# Patient Record
Sex: Female | Born: 1980 | Race: White | Hispanic: No | Smoking: Current every day smoker
Health system: Southern US, Community
[De-identification: ages and names within clinical notes are randomized; demographics above are authoritative.]

## PROBLEM LIST (undated history)

## (undated) ENCOUNTER — Inpatient Hospital Stay: Admission: EM | Payer: Self-pay | Source: Home / Self Care

## (undated) DIAGNOSIS — M549 Dorsalgia, unspecified: Secondary | ICD-10-CM

## (undated) DIAGNOSIS — R636 Underweight: Secondary | ICD-10-CM

## (undated) DIAGNOSIS — R1900 Intra-abdominal and pelvic swelling, mass and lump, unspecified site: Secondary | ICD-10-CM

## (undated) DIAGNOSIS — J40 Bronchitis, not specified as acute or chronic: Secondary | ICD-10-CM

## (undated) DIAGNOSIS — N939 Abnormal uterine and vaginal bleeding, unspecified: Secondary | ICD-10-CM

## (undated) DIAGNOSIS — Z720 Tobacco use: Secondary | ICD-10-CM

## (undated) DIAGNOSIS — I499 Cardiac arrhythmia, unspecified: Secondary | ICD-10-CM

## (undated) DIAGNOSIS — K219 Gastro-esophageal reflux disease without esophagitis: Secondary | ICD-10-CM

## (undated) DIAGNOSIS — K279 Peptic ulcer, site unspecified, unspecified as acute or chronic, without hemorrhage or perforation: Secondary | ICD-10-CM

## (undated) DIAGNOSIS — G709 Myoneural disorder, unspecified: Secondary | ICD-10-CM

## (undated) DIAGNOSIS — F419 Anxiety disorder, unspecified: Secondary | ICD-10-CM

## (undated) DIAGNOSIS — I10 Essential (primary) hypertension: Secondary | ICD-10-CM

## (undated) DIAGNOSIS — K8020 Calculus of gallbladder without cholecystitis without obstruction: Secondary | ICD-10-CM

## (undated) DIAGNOSIS — R000 Tachycardia, unspecified: Secondary | ICD-10-CM

## (undated) DIAGNOSIS — G8929 Other chronic pain: Secondary | ICD-10-CM

## (undated) HISTORY — PX: OTHER SURGICAL HISTORY: SHX169

## (undated) HISTORY — DX: Tobacco use: Z72.0

## (undated) HISTORY — DX: Myoneural disorder, unspecified: G70.9

## (undated) HISTORY — DX: Abnormal uterine and vaginal bleeding, unspecified: N93.9

## (undated) HISTORY — PX: DENTAL REHABILITATION: SHX1449

## (undated) HISTORY — DX: Calculus of gallbladder without cholecystitis without obstruction: K80.20

---
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
## 2000-06-20 ENCOUNTER — Emergency Department (HOSPITAL_COMMUNITY): Admission: EM | Admit: 2000-06-20 | Discharge: 2000-06-20 | Payer: Self-pay | Admitting: *Deleted

## 2000-12-20 ENCOUNTER — Emergency Department (HOSPITAL_COMMUNITY): Admission: EM | Admit: 2000-12-20 | Discharge: 2000-12-20 | Payer: Self-pay | Admitting: Emergency Medicine

## 2002-05-25 ENCOUNTER — Other Ambulatory Visit: Admission: RE | Admit: 2002-05-25 | Discharge: 2002-05-25 | Payer: Self-pay | Admitting: Obstetrics & Gynecology

## 2003-06-26 ENCOUNTER — Ambulatory Visit (HOSPITAL_COMMUNITY): Admission: RE | Admit: 2003-06-26 | Discharge: 2003-06-26 | Payer: Self-pay | Admitting: Obstetrics and Gynecology

## 2003-07-19 ENCOUNTER — Inpatient Hospital Stay (HOSPITAL_COMMUNITY): Admission: AD | Admit: 2003-07-19 | Discharge: 2003-07-19 | Payer: Self-pay | Admitting: Obstetrics and Gynecology

## 2003-08-22 ENCOUNTER — Inpatient Hospital Stay (HOSPITAL_COMMUNITY): Admission: AD | Admit: 2003-08-22 | Discharge: 2003-08-22 | Payer: Self-pay | Admitting: Obstetrics and Gynecology

## 2003-09-20 ENCOUNTER — Inpatient Hospital Stay (HOSPITAL_COMMUNITY): Admission: AD | Admit: 2003-09-20 | Discharge: 2003-09-22 | Payer: Self-pay | Admitting: Obstetrics and Gynecology

## 2003-10-18 ENCOUNTER — Other Ambulatory Visit: Admission: RE | Admit: 2003-10-18 | Discharge: 2003-10-18 | Payer: Self-pay | Admitting: Obstetrics and Gynecology

## 2004-09-09 ENCOUNTER — Emergency Department (HOSPITAL_COMMUNITY): Admission: EM | Admit: 2004-09-09 | Discharge: 2004-09-09 | Payer: Self-pay | Admitting: Family Medicine

## 2004-10-17 ENCOUNTER — Ambulatory Visit: Payer: Self-pay | Admitting: Internal Medicine

## 2004-10-17 ENCOUNTER — Ambulatory Visit (HOSPITAL_COMMUNITY): Admission: RE | Admit: 2004-10-17 | Discharge: 2004-10-17 | Payer: Self-pay | Admitting: Internal Medicine

## 2004-10-21 ENCOUNTER — Ambulatory Visit: Payer: Self-pay | Admitting: *Deleted

## 2004-11-06 ENCOUNTER — Ambulatory Visit: Payer: Self-pay | Admitting: Internal Medicine

## 2004-12-05 ENCOUNTER — Ambulatory Visit: Payer: Self-pay | Admitting: Internal Medicine

## 2004-12-15 ENCOUNTER — Encounter: Admission: RE | Admit: 2004-12-15 | Discharge: 2005-01-13 | Payer: Self-pay | Admitting: Internal Medicine

## 2004-12-19 ENCOUNTER — Ambulatory Visit: Payer: Self-pay | Admitting: Internal Medicine

## 2004-12-29 ENCOUNTER — Encounter: Payer: Self-pay | Admitting: Internal Medicine

## 2004-12-29 LAB — CONVERTED CEMR LAB: Pap Smear: NORMAL

## 2005-09-28 ENCOUNTER — Emergency Department (HOSPITAL_COMMUNITY): Admission: EM | Admit: 2005-09-28 | Discharge: 2005-09-28 | Payer: Self-pay | Admitting: Family Medicine

## 2005-12-24 ENCOUNTER — Ambulatory Visit: Payer: Self-pay | Admitting: Internal Medicine

## 2006-01-11 ENCOUNTER — Emergency Department (HOSPITAL_COMMUNITY): Admission: EM | Admit: 2006-01-11 | Discharge: 2006-01-11 | Payer: Self-pay | Admitting: Family Medicine

## 2006-02-02 ENCOUNTER — Encounter: Payer: Self-pay | Admitting: Internal Medicine

## 2006-02-02 DIAGNOSIS — F17200 Nicotine dependence, unspecified, uncomplicated: Secondary | ICD-10-CM | POA: Insufficient documentation

## 2006-02-02 DIAGNOSIS — M25519 Pain in unspecified shoulder: Secondary | ICD-10-CM

## 2006-02-02 HISTORY — DX: Nicotine dependence, unspecified, uncomplicated: F17.200

## 2006-02-03 ENCOUNTER — Ambulatory Visit: Payer: Self-pay | Admitting: Internal Medicine

## 2006-02-03 ENCOUNTER — Ambulatory Visit (HOSPITAL_COMMUNITY): Admission: RE | Admit: 2006-02-03 | Discharge: 2006-02-03 | Payer: Self-pay | Admitting: Internal Medicine

## 2006-02-03 LAB — CONVERTED CEMR LAB
ALT: <8 units/L (ref 0–35)
AST: 9 units/L (ref 0–37)
Albumin: 4.4 g/dL (ref 3.5–5.2)
Alkaline Phosphatase: 57 units/L (ref 39–117)
BUN: 7 mg/dL (ref 6–23)
Basophils Absolute: 0 10*3/uL (ref 0.0–0.1)
CO2: 25 meq/L (ref 19–32)
Calcium: 11.3 mg/dL — ABNORMAL HIGH (ref 8.4–10.5)
Creatinine, Ser: 0.75 mg/dL (ref 0.40–1.20)
Eosinophils Relative: 4 % (ref 0–4)
HCT: 42.9 % (ref 34.4–43.3)
Lymphocytes Relative: 40 % (ref 15–43)
Lymphs Abs: 2.1 10*3/uL (ref 0.8–3.1)
Monocytes Absolute: 0.4 10*3/uL (ref 0.2–0.7)
Monocytes Relative: 7 % (ref 3–11)
Potassium: 4.1 meq/L (ref 3.5–5.3)
RBC: 4.42 M/uL (ref 3.79–4.96)
Sodium: 141 meq/L (ref 135–145)
TSH: 1.458 microintl units/mL (ref 0.350–5.50)
Total Bilirubin: 1.7 mg/dL — ABNORMAL HIGH (ref 0.3–1.2)
Total Protein: 7.5 g/dL (ref 6.0–8.3)
Vitamin B-12: 259 pg/mL (ref 211–911)

## 2006-02-09 ENCOUNTER — Ambulatory Visit: Payer: Self-pay | Admitting: Internal Medicine

## 2006-02-09 LAB — CONVERTED CEMR LAB
AST: 12 units/L (ref 0–37)
BUN: 7 mg/dL (ref 6–23)
Calcium, Total (PTH): 9.5 mg/dL (ref 8.4–10.5)
Calcium: 9.5 mg/dL (ref 8.4–10.5)
Total Bilirubin: 0.8 mg/dL (ref 0.3–1.2)

## 2006-03-10 ENCOUNTER — Ambulatory Visit: Payer: Self-pay | Admitting: Internal Medicine

## 2006-03-10 LAB — CONVERTED CEMR LAB
Basophils Relative: 1 % (ref 0–1)
Eosinophils Relative: 5 % (ref 0–5)
Lymphocytes Relative: 36 % (ref 12–46)
MCHC: 34.4 g/dL (ref 30.0–36.0)
Monocytes Absolute: 0.5 10*3/uL (ref 0.2–0.7)
Monocytes Relative: 6 % (ref 3–11)
Neutro Abs: 4.4 10*3/uL (ref 1.7–7.7)
Neutrophils Relative %: 53 % (ref 43–77)
RBC: 4.13 M/uL (ref 3.87–5.11)
RDW: 12.8 % (ref 11.5–14.0)
Sed Rate: 6 mm/hr (ref 0–22)

## 2006-03-12 DIAGNOSIS — D239 Other benign neoplasm of skin, unspecified: Secondary | ICD-10-CM | POA: Insufficient documentation

## 2006-03-26 ENCOUNTER — Ambulatory Visit (HOSPITAL_COMMUNITY): Admission: RE | Admit: 2006-03-26 | Discharge: 2006-03-26 | Payer: Self-pay | Admitting: Internal Medicine

## 2006-04-02 ENCOUNTER — Telehealth: Payer: Self-pay | Admitting: Internal Medicine

## 2006-04-02 ENCOUNTER — Encounter: Payer: Self-pay | Admitting: Internal Medicine

## 2006-04-06 ENCOUNTER — Emergency Department (HOSPITAL_COMMUNITY): Admission: EM | Admit: 2006-04-06 | Discharge: 2006-04-06 | Payer: Self-pay | Admitting: Family Medicine

## 2006-04-14 ENCOUNTER — Encounter: Payer: Self-pay | Admitting: Internal Medicine

## 2006-04-14 ENCOUNTER — Encounter: Payer: Self-pay | Admitting: Obstetrics and Gynecology

## 2006-04-14 ENCOUNTER — Ambulatory Visit: Payer: Self-pay | Admitting: Obstetrics and Gynecology

## 2006-04-14 LAB — CONVERTED CEMR LAB

## 2006-04-30 ENCOUNTER — Emergency Department (HOSPITAL_COMMUNITY): Admission: EM | Admit: 2006-04-30 | Discharge: 2006-04-30 | Payer: Self-pay | Admitting: Emergency Medicine

## 2006-05-21 ENCOUNTER — Emergency Department (HOSPITAL_COMMUNITY): Admission: EM | Admit: 2006-05-21 | Discharge: 2006-05-21 | Payer: Self-pay | Admitting: Family Medicine

## 2006-05-26 ENCOUNTER — Ambulatory Visit: Payer: Self-pay | Admitting: Internal Medicine

## 2006-07-23 ENCOUNTER — Encounter: Payer: Self-pay | Admitting: Internal Medicine

## 2006-07-28 ENCOUNTER — Ambulatory Visit: Payer: Self-pay | Admitting: Internal Medicine

## 2006-08-24 ENCOUNTER — Emergency Department (HOSPITAL_COMMUNITY): Admission: EM | Admit: 2006-08-24 | Discharge: 2006-08-24 | Payer: Self-pay | Admitting: Family Medicine

## 2006-09-27 ENCOUNTER — Emergency Department (HOSPITAL_COMMUNITY): Admission: EM | Admit: 2006-09-27 | Discharge: 2006-09-27 | Payer: Self-pay | Admitting: Emergency Medicine

## 2006-10-14 ENCOUNTER — Ambulatory Visit: Payer: Self-pay | Admitting: Family Medicine

## 2006-10-15 ENCOUNTER — Ambulatory Visit (HOSPITAL_COMMUNITY): Admission: RE | Admit: 2006-10-15 | Discharge: 2006-10-15 | Payer: Self-pay | Admitting: Family Medicine

## 2006-11-26 ENCOUNTER — Ambulatory Visit: Payer: Self-pay | Admitting: Gynecology

## 2007-01-05 ENCOUNTER — Emergency Department (HOSPITAL_COMMUNITY): Admission: EM | Admit: 2007-01-05 | Discharge: 2007-01-05 | Payer: Self-pay | Admitting: Family Medicine

## 2007-02-11 ENCOUNTER — Emergency Department (HOSPITAL_COMMUNITY): Admission: EM | Admit: 2007-02-11 | Discharge: 2007-02-11 | Payer: Self-pay | Admitting: Emergency Medicine

## 2007-05-25 ENCOUNTER — Ambulatory Visit: Payer: Self-pay | Admitting: Internal Medicine

## 2007-05-25 DIAGNOSIS — R221 Localized swelling, mass and lump, neck: Secondary | ICD-10-CM

## 2007-05-25 DIAGNOSIS — R220 Localized swelling, mass and lump, head: Secondary | ICD-10-CM

## 2007-05-25 LAB — CONVERTED CEMR LAB
ALT: <8 units/L (ref 0–35)
Albumin: 4.4 g/dL (ref 3.5–5.2)
CO2: 25 meq/L (ref 19–32)
Glucose, Bld: 83 mg/dL (ref 70–99)
HCT: 42 % (ref 36.0–46.0)
Hemoglobin: 13.9 g/dL (ref 12.0–15.0)
MCHC: 33.1 g/dL (ref 30.0–36.0)
Neutro Abs: 2.5 10*3/uL (ref 1.7–7.7)
Potassium: 4.3 meq/L (ref 3.5–5.3)
RBC: 4.25 M/uL (ref 3.87–5.11)
Sed Rate: 9 mm/hr (ref 0–22)
Sodium: 142 meq/L (ref 135–145)
Total Protein: 7.2 g/dL (ref 6.0–8.3)
WBC: 5.5 10*3/uL (ref 4.0–10.5)

## 2007-06-29 ENCOUNTER — Ambulatory Visit: Payer: Self-pay | Admitting: Internal Medicine

## 2007-06-29 DIAGNOSIS — L708 Other acne: Secondary | ICD-10-CM | POA: Insufficient documentation

## 2007-10-26 ENCOUNTER — Ambulatory Visit: Payer: Self-pay | Admitting: *Deleted

## 2007-10-26 ENCOUNTER — Ambulatory Visit (HOSPITAL_COMMUNITY): Admission: RE | Admit: 2007-10-26 | Discharge: 2007-10-26 | Payer: Self-pay | Admitting: *Deleted

## 2007-10-26 DIAGNOSIS — M533 Sacrococcygeal disorders, not elsewhere classified: Secondary | ICD-10-CM | POA: Insufficient documentation

## 2008-01-18 ENCOUNTER — Telehealth: Payer: Self-pay | Admitting: *Deleted

## 2008-04-13 ENCOUNTER — Encounter: Payer: Self-pay | Admitting: Internal Medicine

## 2008-04-13 ENCOUNTER — Ambulatory Visit: Payer: Self-pay | Admitting: Internal Medicine

## 2008-04-13 DIAGNOSIS — M5417 Radiculopathy, lumbosacral region: Secondary | ICD-10-CM | POA: Insufficient documentation

## 2008-04-16 ENCOUNTER — Ambulatory Visit (HOSPITAL_COMMUNITY): Admission: RE | Admit: 2008-04-16 | Discharge: 2008-04-16 | Payer: Self-pay | Admitting: Internal Medicine

## 2008-04-23 ENCOUNTER — Ambulatory Visit: Payer: Self-pay | Admitting: Sports Medicine

## 2008-04-26 ENCOUNTER — Telehealth (INDEPENDENT_AMBULATORY_CARE_PROVIDER_SITE_OTHER): Payer: Self-pay | Admitting: *Deleted

## 2008-04-30 ENCOUNTER — Encounter: Payer: Self-pay | Admitting: Sports Medicine

## 2008-05-09 ENCOUNTER — Ambulatory Visit: Payer: Self-pay | Admitting: Internal Medicine

## 2008-05-11 ENCOUNTER — Encounter: Admission: RE | Admit: 2008-05-11 | Discharge: 2008-07-04 | Payer: Self-pay | Admitting: Sports Medicine

## 2008-05-21 ENCOUNTER — Encounter (INDEPENDENT_AMBULATORY_CARE_PROVIDER_SITE_OTHER): Payer: Self-pay | Admitting: Family Medicine

## 2008-05-21 ENCOUNTER — Ambulatory Visit: Payer: Self-pay | Admitting: Sports Medicine

## 2008-05-21 DIAGNOSIS — M4040 Postural lordosis, site unspecified: Secondary | ICD-10-CM | POA: Insufficient documentation

## 2008-05-22 ENCOUNTER — Encounter: Payer: Self-pay | Admitting: Sports Medicine

## 2008-06-13 ENCOUNTER — Encounter: Payer: Self-pay | Admitting: Sports Medicine

## 2008-07-09 ENCOUNTER — Encounter: Payer: Self-pay | Admitting: Sports Medicine

## 2008-08-01 ENCOUNTER — Ambulatory Visit: Payer: Self-pay | Admitting: Sports Medicine

## 2008-08-08 ENCOUNTER — Ambulatory Visit: Payer: Self-pay | Admitting: Internal Medicine

## 2008-08-10 DIAGNOSIS — R519 Headache, unspecified: Secondary | ICD-10-CM | POA: Insufficient documentation

## 2008-08-10 DIAGNOSIS — R51 Headache: Secondary | ICD-10-CM

## 2008-08-31 ENCOUNTER — Emergency Department (HOSPITAL_COMMUNITY): Admission: EM | Admit: 2008-08-31 | Discharge: 2008-08-31 | Payer: Self-pay | Admitting: Family Medicine

## 2008-09-06 ENCOUNTER — Telehealth: Payer: Self-pay | Admitting: Internal Medicine

## 2008-09-14 ENCOUNTER — Encounter: Payer: Self-pay | Admitting: Internal Medicine

## 2008-09-14 ENCOUNTER — Ambulatory Visit: Payer: Self-pay | Admitting: Internal Medicine

## 2008-09-14 DIAGNOSIS — L989 Disorder of the skin and subcutaneous tissue, unspecified: Secondary | ICD-10-CM | POA: Insufficient documentation

## 2008-09-14 LAB — CONVERTED CEMR LAB
Cholesterol: 180 mg/dL (ref 0–200)
HCT: 38.5 % (ref 36.0–46.0)
HDL: 38 mg/dL — ABNORMAL LOW (ref >39)
Hemoglobin: 13.4 g/dL (ref 12.0–15.0)
LDL Cholesterol: 115 mg/dL — ABNORMAL HIGH (ref 0–99)
RBC: 4.06 M/uL (ref 3.87–5.11)
Triglycerides: 133 mg/dL (ref <150)
WBC: 5.1 10*3/uL (ref 4.0–10.5)

## 2008-11-07 ENCOUNTER — Ambulatory Visit: Payer: Self-pay | Admitting: Sports Medicine

## 2009-01-22 ENCOUNTER — Emergency Department (HOSPITAL_COMMUNITY): Admission: EM | Admit: 2009-01-22 | Discharge: 2009-01-22 | Payer: Self-pay | Admitting: Family Medicine

## 2009-05-01 ENCOUNTER — Ambulatory Visit: Payer: Self-pay | Admitting: Internal Medicine

## 2009-05-02 DIAGNOSIS — M25579 Pain in unspecified ankle and joints of unspecified foot: Secondary | ICD-10-CM

## 2009-05-09 ENCOUNTER — Encounter: Payer: Self-pay | Admitting: Internal Medicine

## 2009-06-23 ENCOUNTER — Emergency Department (HOSPITAL_COMMUNITY): Admission: EM | Admit: 2009-06-23 | Discharge: 2009-06-23 | Payer: Self-pay | Admitting: Family Medicine

## 2009-06-26 ENCOUNTER — Ambulatory Visit: Payer: Self-pay | Admitting: Obstetrics & Gynecology

## 2009-07-24 ENCOUNTER — Ambulatory Visit: Payer: Self-pay | Admitting: Sports Medicine

## 2009-07-24 ENCOUNTER — Ambulatory Visit: Payer: Self-pay | Admitting: Obstetrics and Gynecology

## 2009-07-24 ENCOUNTER — Other Ambulatory Visit: Admission: RE | Admit: 2009-07-24 | Discharge: 2009-07-24 | Payer: Self-pay | Admitting: Obstetrics and Gynecology

## 2009-08-07 ENCOUNTER — Ambulatory Visit: Payer: Self-pay | Admitting: Obstetrics and Gynecology

## 2009-08-14 ENCOUNTER — Emergency Department (HOSPITAL_COMMUNITY): Admission: EM | Admit: 2009-08-14 | Discharge: 2009-08-14 | Payer: Self-pay | Admitting: Family Medicine

## 2009-12-11 ENCOUNTER — Ambulatory Visit: Payer: Self-pay | Admitting: Sports Medicine

## 2010-01-02 ENCOUNTER — Emergency Department (HOSPITAL_COMMUNITY): Admission: EM | Admit: 2010-01-02 | Discharge: 2010-01-02 | Payer: Self-pay | Admitting: Family Medicine

## 2010-02-06 ENCOUNTER — Ambulatory Visit: Payer: Self-pay | Admitting: Sports Medicine

## 2010-02-06 DIAGNOSIS — M79609 Pain in unspecified limb: Secondary | ICD-10-CM

## 2010-02-19 ENCOUNTER — Ambulatory Visit: Payer: Self-pay | Admitting: Obstetrics & Gynecology

## 2010-03-13 ENCOUNTER — Ambulatory Visit
Admission: RE | Admit: 2010-03-13 | Discharge: 2010-03-13 | Payer: Self-pay | Source: Home / Self Care | Attending: Obstetrics and Gynecology | Admitting: Obstetrics and Gynecology

## 2010-03-18 ENCOUNTER — Encounter
Admission: RE | Admit: 2010-03-18 | Discharge: 2010-03-18 | Payer: Self-pay | Source: Home / Self Care | Attending: Obstetrics and Gynecology | Admitting: Obstetrics and Gynecology

## 2010-03-19 ENCOUNTER — Encounter (INDEPENDENT_AMBULATORY_CARE_PROVIDER_SITE_OTHER): Payer: Self-pay | Admitting: *Deleted

## 2010-03-27 ENCOUNTER — Emergency Department (HOSPITAL_COMMUNITY)
Admission: EM | Admit: 2010-03-27 | Discharge: 2010-03-27 | Payer: Self-pay | Source: Home / Self Care | Admitting: Family Medicine

## 2010-03-27 ENCOUNTER — Ambulatory Visit
Admission: RE | Admit: 2010-03-27 | Discharge: 2010-03-27 | Payer: Self-pay | Source: Home / Self Care | Attending: Obstetrics and Gynecology | Admitting: Obstetrics and Gynecology

## 2010-03-28 NOTE — Progress Notes (Unsigned)
NAME:  Melissa Walker, Melissa Walker NO.:  [id number]  MEDICAL RECORD NO.:  [id number]          PATIENT TYPE:  WOC  LOCATION:  [facility]                   FACILITY:  [facility]  PHYSICIAN:  Argentina Donovan, MD        DATE OF BIRTH:  10-18-1980  DATE OF SERVICE:  03/27/2010                                 CLINIC NOTE  The patient is a 30 year old white female who was sent for ultrasound of the breast because of a tender nodule in the area of the nipple, I thought this is probably due to fibrocystic disease, everything looked benign, we do want a followup ultrasound again in 6 months since I found a tiny fibroadenoma around the nipple of the left breast, so they told the patient that they would contact her at that point.  IMPRESSION:  Fibrocystic breast.          ______________________________ Argentina Donovan, MD    PR/MEDQ  D:  03/27/2010  T:  03/28/2010  Job:  [id number]

## 2010-04-01 NOTE — Assessment & Plan Note (Signed)
Summary: BACK PAIN,MC   Vital Signs:  Patient profile:   30 year old female BP sitting:   122 / 83  Vitals Entered By: Lillia Pauls CMA (Jul 24, 2009 9:01 AM)  Primary Provider:  Margarito Liner MD   History of Present Illness: still standing at job uses brace 3-4 hrs/day helps some amytryptolene at night, 4 tramadol/day  moderates but doesn't get rid of pain  no pain before pregnancy  still has daily pain pain will get all the way to 9 level at times when severe takes pill and lays down - this helps in 20 mins  Allergies: 1)  ! Penicillin 2)  ! Nicoderm Cq (Nicotine)  Physical Exam  General:  thin W female looks depressed nad  tight hamstrings on forward flexion - can do hamstring stretch  excess lumbar lordosis  midline back pain on back extension  toe, heel, tandem walk fine  tight on lumbar areat on rotation good SIJ movement on rock faber, hip flexion nl  good quad strength     Impression & Recommendations:  Problem # 1:  LOW BACK PAIN, CHRONIC (ICD-724.2)  Her updated medication list for this problem includes:    Tramadol Hcl 50 Mg Tabs (Tramadol hcl) .Marland Kitchen... Take 1 tablet by mouth four times a day as needed for pain  uses amitriptyline for headaches will increase this to work on LBP  start now at 50 mgm nightly may try increasing to 100 over 3 mos if sideeffects don't intervene  I will reck in 6 mos and let dr Meredith Pel increase meds if they are helping  Her updated medication list for this problem includes:    Tramadol Hcl 50 Mg Tabs (Tramadol hcl) .Marland Kitchen... 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 Apply two times a day to affected area. 2)  Tramadol Hcl 50 Mg Tabs (Tramadol hcl) .... Take 1 tablet by mouth four times a day  as needed for pain 3)  Amitriptyline Hcl 25 Mg Tabs (Amitriptyline hcl) .... Take 1 tablet by mouth at bedtime 4)  Doxycycline Hyclate 100 Mg Caps (Doxycycline hyclate) .... Take 1 capsule by mouth two times a day

## 2010-04-01 NOTE — Miscellaneous (Signed)
Summary: [facility]: Vaccine Records  [facility]: Vaccine Records   Imported By: Florinda Marker 05/14/2009 10:43:12  _____________________________________________________________________  External Attachment:    Type:   Image     Comment:   External Document  Appended Document: [facility]: Vaccine Records  Immunization History:  Tetanus/Td Immunization History:    Tetanus/Td:  td (09/27/2007)

## 2010-04-01 NOTE — Assessment & Plan Note (Signed)
Summary: check up [mkj]   Vital Signs:  Patient profile:   30 year old female Height:      63 inches Weight:      89.8 pounds BMI:     15.96 Temp:     97.4 degrees F oral Pulse rate:   81 / minute BP sitting:   121 / 78  (right arm)  Vitals Entered By: Filomena Jungling NT II (May 01, 2009 8:34 AM) CC: check up need refills, ? something for rash ?antibotic Gel did not work/  ankle still bothering here Is Patient Diabetic? No Pain Assessment Patient in pain? yes     Location: ankle Intensity: 8 Type: aching Onset of pain  november  201o Nutritional Status BMI of < 19 = underweight  Have you ever been in a relationship where you felt threatened, hurt or afraid?No   Does patient need assistance? Functional Status Self care Ambulation Normal Comments n   Primary Care Provider:  Margarito Liner MD  CC:  check up need refills and ? something for rash ?antibotic Gel did not work/  ankle still bothering here.  History of Present Illness: Patient returns for follow-up of her chronic low back pain, acne, and other medical problems.  Her main complaint today is of recurrent acne lesions on her face, back, and arms; she reports that erythromycin gel is not clearing the lesions.  She requested an oral antibiotic that was previously prescribed at urgent care sometime last year for an acneform lesion on her back; she reports that her acne cleared with 10 days of therapy at that time.  (Review of the urgent care record indicates that she was given doxycycline 100 mg b.i.d. for 10 days.)  She reports that her back pain is doing reasonably well on her current regimen.  She reports that she is smoking less than 5 cigarettes per day.  Her low back pain is doing reasonably well on current regimen.  She reports some left ankle pain that has persisted following a prior ankle sprain; this is apparently much better.  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 Apply Two Times A Day To Affected Area. 2)  Tramadol Hcl 50 Mg Tabs (Tramadol Hcl) .... Take 1 Tablet By Mouth Four Times A Day As Needed For Pain 3)  Amitriptyline Hcl 25 Mg Tabs (Amitriptyline Hcl) .... Take 1 Tablet By Mouth At Bedtime  Allergies (verified): 1)  ! Penicillin 2)  ! Nicoderm Cq (Nicotine)  Family History: No cervical cancer. No breast cancer in immediate family. No colon cancer. Grandmother had lung cancer. Grandfather has prostate cancer. Family History Hypertension grandmother, uncle  Social History: Packs/Day:  <0.25  Physical Exam  General:  alert, no distress Lungs:  normal respiratory effort, normal breath sounds, no crackles, and no wheezes.   Heart:  normal rate, regular rhythm, no murmur, no gallop, and no rub.   Abdomen:  soft, non-tender,  normal bowel sounds, no hepatomegaly, and no splenomegaly.   Msk:  there is no swelling, discoloration, or apparent instability of the left ankle; there is some focal mild tenderness on the anterolateral aspect. Extremities:  no edema Skin:  scattered acneiform lesions on face, upper arms, and back   Impression & Recommendations:  Problem # 1:  ACNE, MILD (ICD-706.1) Patient has had a recent recurrence of acne lesions, and is not having a response to a topical antibiotic gel.  She reports good response to a previous short course of doxycycline.  I discussed the contraindication to doxycycline in pregnancy, and she assured me that her last menstrual period was two weeks ago, was normal and on time, and that there is no  possibility she is pregnant.  The plan is to treat with doxycycline 100 mg b.i.d. for 10 days, and then resume topical therapy for any recurrent lesions.  Her updated medication list for this problem includes:    Erythromycin 2 % Gel (Erythromycin) .Marland Kitchen... Apply two times a day to affected area.    Doxycycline Hyclate 100 Mg Caps (Doxycycline hyclate) .Marland Kitchen... Take 1 capsule by mouth two times a day  Problem # 2:  TOBACCO ABUSE (ICD-305.1) I discussed the importance of smoking cessation, and strongly advised patient to stop smoking.  She had an allergic reaction to topical nicotine patch in the past.  She is only smoking a few cigarettes per day, and denies significant craving when she does not smoke.  She feels that she can stop without a medication to assist her.  She did set a quit date and agreed to stop smoking at that time.  I advised her to contact me if she has difficulty stopping, and in that event would consider a medication such as Zyban.  Problem # 3:  LOW BACK PAIN, CHRONIC (ICD-724.2) Patient's chronic low back pain is doing reasonably well on current management.  The following medications were removed from the medication list:    Diclofenac Sodium 75 Mg Tbec (Diclofenac sodium) ..... One pill twice daily as needed for pain.    Cyclobenzaprine Hcl 5 Mg Tabs (Cyclobenzaprine hcl) .Marland Kitchen... Take 1 tablet by mouth once a day as needed for pain Her updated medication list for this problem includes:    Tramadol Hcl 50 Mg Tabs (Tramadol hcl) .Marland Kitchen... Take 1 tablet by mouth four times a day as needed for pain  Problem # 4:  ANKLE PAIN, LEFT (ICD-719.47) Patient has some residual pain and stiffness following a remote ankle sprain, but reports that this is much better than at the time of her injury.  Her exam is unremarkable.  I advised that she try an Ace wrap when she is on her feet at work, and she agreed to do this.  Complete Medication List: 1)  Erythromycin 2 % Gel (Erythromycin) .... Apply two  times a day to affected area. 2)  Tramadol Hcl 50 Mg Tabs (Tramadol hcl) .... Take 1 tablet by mouth four times a day as needed for pain 3)  Amitriptyline Hcl 25 Mg Tabs (Amitriptyline hcl) .... Take 1 tablet by mouth at bedtime 4)  Doxycycline Hyclate 100 Mg Caps (Doxycycline hyclate) .... Take 1 capsule by mouth two times a day  Other Orders: Gynecologic Referral (Gyn) Admin 1st Vaccine (96295) Flu Vaccine 12yrs + (28413)  Patient Instructions: 1)  Please schedule a follow-up appointment in 2 months. 2)  Take doxycycline 100 mg one capsule two times a day for 10 days. 3)  Use  erythromycin gel as prescribed for acne. 4)  Stop smoking.  Prescriptions: AMITRIPTYLINE HCL 25 MG TABS (AMITRIPTYLINE HCL) Take 1 tablet by mouth at bedtime  #30 x 6   Entered and Authorized by:   Margarito Liner MD   Signed by:   Margarito Liner MD on 05/01/2009   Method used:   Electronically to        [facility] [address] (retail)       [phone]       [place], [place]  [address]       Ph: [phone]       Fax: [phone]   RxID:   [id number] DOXYCYCLINE HYCLATE 100 MG CAPS (DOXYCYCLINE HYCLATE) Take 1 capsule by mouth two times a day  #20 x 0   Entered and Authorized by:   Margarito Liner MD   Signed by:   Margarito Liner MD on 05/01/2009   Method used:   Electronically to        [facility] [address] (retail)       [phone]       [place], [place]  [address]       Ph: [phone]       Fax: [phone]   RxID:   [id number] TRAMADOL HCL 50 MG TABS (TRAMADOL HCL) Take 1 tablet by mouth four times a day as needed for pain  #120 x 3   Entered and Authorized by:   Margarito Liner MD   Signed by:   Margarito Liner MD on 05/01/2009   Method used:   Electronically to        [facility] [address] (retail)       [phone]       [place], [place]  [address]       Ph: [phone]       Fax:  [phone]   RxID:   [id number] ERYTHROMYCIN 2 %  GEL (ERYTHROMYCIN) Apply two times a day to affected area.  #30 grams x 3   Entered and Authorized by:   Margarito Liner MD   Signed by:   Margarito Liner MD on 05/01/2009   Method used:   Electronically to        [facility] [address] (retail)       [phone]       [place], [place]  [address]       Ph: [phone]       Fax: [phone]   RxID:   [id number]   Prevention & Chronic Care Immunizations   Influenza vaccine: Fluvax 3+  (05/01/2009)    Tetanus booster: Not documented    Pneumococcal vaccine: Not documented    Immunization comments: Reports that she had a tetanus booster at [facility] about 1 year ago.  Other Screening   Pap smear: Specimen Adequacy: Satisfactory for evaluation.   Interpretation/Result:Negative for intraepithelial Lesion or Malignancy.     (04/14/2006)   Pap smear action/deferral: GYN Referral  (05/01/2009)   Pap smear due: 04/2007   Smoking status: current  (05/01/2009)   Smoking cessation counseling: yes  (05/01/2009)   Target quit date: 05/13/2009  (05/01/2009)      Resource handout printed.   Nursing Instructions: Give Flu vaccine today Gyn referral for screening Pap (see order)  Flu Vaccine Consent Questions     Do you have a history of severe allergic reactions to this vaccine? no    Any prior history of allergic reactions to egg and/or gelatin? no    Do you have a sensitivity to the preservative Thimersol? no    Do you have a past history of Guillan-Barre Syndrome? no    Do you currently have an acute febrile illness? no    Have you ever had a severe reaction to latex? no    Vaccine information given and explained to patient? yes    Are you currently pregnant? no    Lot [id number]   Exp Date:05/30/2009   Manufacturer: [employer]    Site Given  Left Deltoid IMflu Chinita Pester RN  May 01, 2009 9:53 AM

## 2010-04-01 NOTE — Assessment & Plan Note (Signed)
Summary: F/U [facility]   Vital Signs:  Patient profile:   30 year old female Pulse rate:   93 / minute BP sitting:   117 / 76  (right arm)  Vitals Entered By: Rochele Pages RN (February 06, 2010 10:23 AM) CC: f/u low back pain- 70% improved   Primary Provider:  Margarito Liner MD  CC:  f/u low back pain- 70% improved.  History of Present Illness: Melissa Walker is here for six week follow up of chronic lower back pain. Her pain is >70% improved and she is sleeping well at night. She has been using a brace at work, heating pad at night, and Tramadol as needed. She had been using Meloxicam at night with good effect until last week; she stopped because it made her feel bloated. She also has placed inserts in her shoes.  She does complain of tingling and weakness in her lateral thighs that began about two weeks ago, R>L. She first noted this while doing PT exercises at home, and thinks she may have "overdone it." Since then, she has felt this weakness and tingling both at work after long periods of standing, as well as at home. She states she must sit down when this happens, and she shakes her leg from the hip to "get feeling into it," but denies overt numbness.  Preventive Screening-Counseling & Management  Alcohol-Tobacco     Smoking Status: current     Smoking Cessation Counseling: yes     Packs/Day: 1.0  Allergies: 1)  ! Penicillin 2)  ! Nicoderm Cq (Nicotine)  Social History: Packs/Day:  1.0  Physical Exam  General:  Alert, thin woman in NAD. Msk:  Low back: Hyperpigmentation of skin. No assymetry or swelling. No tenderness to palpation. No pain with straight leg raise.  Hips: Full ROM, weakness to both abduction and adduction bilaterally.  weakness on hip rotation Pulses:  Posterior tibial pulses 2+, equal bilaterally. Neurologic:  Sensation intact to light touch in lower extremities. Normal gait. No clonus.      Impression & Recommendations:  Problem # 1:  LOW BACK PAIN, CHRONIC  (ICD-724.2) Improved. Continue use of brace, heating pad, amitriptaline at night, and tramadol as needed. Continue to avoid heavy lifting. May take meloxicam up to every other day to avoid bloating while maintaining pain control.   Her updated medication list for this problem includes:    Tramadol Hcl 50 Mg Tabs (Tramadol hcl) .Marland Kitchen... Take 1 tablet by mouth four times a day as needed for pain    Meloxicam 7.5 Mg Tabs (Meloxicam) .Marland Kitchen... Take 1 by mouth once daily with food  I renewed her tramadol she usually only uses 2 per day  Problem # 2:  LEG PAIN, BILATERAL (ICD-729.5) Likely due to weakness at hip girdle. Instructed in exercises, including leg lifts (hip adduction) and hip flexion with external and internal rotation. Follow up in 3 months.  Complete Medication List: 1)  Erythromycin 2 % Gel (Erythromycin) .... Apply two times a day to affected area. 2)  Tramadol Hcl 50 Mg Tabs (Tramadol hcl) .... Take 1 tablet by mouth four times a day as needed for pain 3)  Doxycycline Hyclate 100 Mg Caps (Doxycycline hyclate) .... Take 1 capsule by mouth two times a day 4)  Meloxicam 7.5 Mg Tabs (Meloxicam) .... Take 1 by mouth once daily with food 5)  Amitriptyline Hcl 50 Mg Tabs (Amitriptyline hcl) .... Take 1 by mouth two times a day Prescriptions: TRAMADOL HCL 50 MG TABS (  TRAMADOL HCL) Take 1 tablet by mouth four times a day as needed for pain  #120 x 4   Entered and Authorized by:   Enid Baas MD   Signed by:   Enid Baas MD on 02/06/2010   Method used:   Electronically to        [facility] [address] (retail)       [phone]       [place], [place]  [address]       Ph: [phone]       Fax: [phone]   RxID:   [id number]    Orders Added: 1)  Est. Patient Level III [25366]

## 2010-04-01 NOTE — Assessment & Plan Note (Signed)
Summary: F/U back pain   Vital Signs:  Patient profile:   30 year old female BP sitting:   107 / 68  Vitals Entered By: Enid Baas MD (December 11, 2009 10:25 AM)  Primary Provider:  Margarito Liner MD   History of Present Illness: Patient returns to clinic today for follow up on low back pain.  States she is worse today that at last visit. Does not feel that she has had any periods of improvement since last visit. Continues to take amitriptylene at bedtime, and tramadol x4 daily, but does not feel the tramadol is helping. Has been doing back exericses and using heat.  Still works [employer] most shifts 6 hrs but sometimes works doubles  sleeps with heating pad and has developed 'rash or bruising" over low back  Allergies: 1)  ! Penicillin 2)  ! Nicoderm Cq (Nicotine)  Physical Exam  General:  Well-developed,thin,in no acute distress; alert,appropriate and cooperative throughout examination   Msk:  SI joint normal with good movement Good flexibility Pearlean Brownie test is normal, straight leg raise normal. Knee to chest normal Lordosis is improved 120 degress of motion at right and left hip  gait is normal no signs of hypermobility except at hips and SI joints  Skin:  Hyperpigmentation across lower back due to heating pad use  Psych:  Oriented X3, memory intact for recent and remote, normally interactive, and good eye contact.     Impression & Recommendations:  Problem # 1:  LOW BACK PAIN, CHRONIC (ICD-724.2)  Her updated medication list for this problem includes:    Tramadol Hcl 50 Mg Tabs (Tramadol hcl) .Marland Kitchen... Take 1 tablet by mouth four times a day as needed for pain    Meloxicam 7.5 Mg Tabs (Meloxicam) .Marland Kitchen... Take 1 by mouth once daily with food  Orders: Lumbar Flexible Support [phone])  will stop tramadol move to Mobic at lower dose considering her weight of 90 lbs increase amitriptyline to 50 x 1 to 2 wks and gradually to 100 if tolerated  Trial on lumbar support  brace for work  reck here in 6 wks  Problem # 2:  SKIN LESION (ICD-709.9) she has signs of erythema "ob igne" over low back from repeated heat exposure  advised that this is not serious but can try cortisone cream to lessen pigment change  Complete Medication List: 1)  Erythromycin 2 % Gel (Erythromycin) .... Apply two times a day to affected area. 2)  Tramadol Hcl 50 Mg Tabs (Tramadol hcl) .... Take 1 tablet by mouth four times a day as needed for pain 3)  Doxycycline Hyclate 100 Mg Caps (Doxycycline hyclate) .... Take 1 capsule by mouth two times a day 4)  Meloxicam 7.5 Mg Tabs (Meloxicam) .... Take 1 by mouth once daily with food 5)  Amitriptyline Hcl 50 Mg Tabs (Amitriptyline hcl) .... Take 1 by mouth two times a day  Patient Instructions: 1)  Try over the couter cortisone cream for rash on lower back. 2)  Take mobic once daily 3)  Take amitriptylene 1 tablet at bedtime for two weeks - if you are not having good pain relief on 1 tablet, you may increase to 2 talbets after 2 weeks. 4)  Return for follow up appointment in 6 weeks Prescriptions: AMITRIPTYLINE HCL 50 MG TABS (AMITRIPTYLINE HCL) take 1 by mouth two times a day  #60 x 5   Entered and Authorized by:   Enid Baas MD   Signed by:   Enid Baas  MD on 12/11/2009   Method used:   Electronically to        [facility] [address] (retail)       [phone]       [place], [place]  [address]       Ph: [phone]       Fax: [phone]   RxID:   [id number] MELOXICAM 7.5 MG TABS (MELOXICAM) take 1 by mouth once daily with food  #30 x 5   Entered and Authorized by:   Enid Baas MD   Signed by:   Enid Baas MD on 12/11/2009   Method used:   Electronically to        [facility] [address] (retail)       [phone]       [place], [place]  [address]       Ph: [phone]       Fax: [phone]   RxID:   [id number]

## 2010-04-03 NOTE — Letter (Signed)
Summary: Generic Letter  [facility]  [address]   [place], [place] [address]   Phone: [phone]  Fax: [phone]    03/19/2010  Melissa Walker [address] [place], [place]  [address]   To whom it may concern:  The above patient should not lift anything greater than 25 pounds due to chronic low back pain.          Sincerely,   Dr. Royal Hawthorn B. Fields

## 2010-04-03 NOTE — Letter (Signed)
Summary: Generic Letter  [facility]  [address]   [place], [place] [address]   Phone: [phone]  Fax: [phone]    03/19/2010  Melissa Walker [address] [place], [place]  [address]  To whom it may concern:  The above patient should not lift anything greater than 25 pounds due to chronic discogenic low back pain.          Sincerely,   Dr. Royal Hawthorn B. Fields   Appended Document: Generic Letter This letter discarded and not given to patient.  A different letter was given to pt stating her dx was chronic low back pain.

## 2010-05-28 ENCOUNTER — Ambulatory Visit (INDEPENDENT_AMBULATORY_CARE_PROVIDER_SITE_OTHER): Payer: Self-pay | Admitting: Family Medicine

## 2010-05-28 DIAGNOSIS — M25551 Pain in right hip: Secondary | ICD-10-CM | POA: Insufficient documentation

## 2010-05-28 DIAGNOSIS — M25559 Pain in unspecified hip: Secondary | ICD-10-CM

## 2010-05-28 NOTE — Progress Notes (Signed)
  Subjective:    Patient ID: Melissa Walker, female    DOB: April 22, 1980, 30 y.o.   MRN: [id number]  HPI 30 year old female with a history of chronic low back pain followed by Dr. Darrick Penna controlled with Mobic tramadol and amitriptyline here for one week of bilateral hip pain. She denies any fall or known injury. She states the only thing she can think of is she took a bath last week instead of taking a shower and maybe she strained herself getting in or out of the bathtub. The pain is located on the lateral aspect of the hips just below the iliac crest region. She denies any numbness tingling or radicular symptoms in her leg or foot. She's been taking her tramadol amitriptyline Mobic for this but does not feel it is helping much. She is unsure of her current amitriptyline dose at this time. The pain seems to be constant throughout the day with walking, prolonged standing, or prolonged sitting. She also has nighttime pain that makes it difficult her for her to lie on either side, and she also has trouble lying on her back because of her chronic low back pain. She mostly has to sleep on her stomach. 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 She still on her feet quite a bit all day every day and does a variety of jobs at [employer]. She denies any other new stressors in her life. She has a 64-year-old son states the relationship with him is good.    Review of Systems She denies fevers, sweats, chills, weight loss, bowel or bladder changes, saddle anesthesia, or appetite changes.    Objective:   Physical Exam General appearance: Very skinny well-appearing female no distress. Back: Mild TTP about the bilateral PSIS and mildly tender over the SI joints bilaterally. Mild pain with Pearlean Brownie bilaterally. Neuro: Normal lower extremity strength bilaterally, normal deep tendon reflexes bilaterally. She can toe walk and heel walk  normally. Sensation is intact to light touch. Hips: She has full range of motion bilaterally with her hips. Negative logroll bilaterally and negative FAI impingement testing bilaterally. She has a negative Stinchfield test. She does have mild pain bilaterally with Xeroform S. stretch. She has normal strength with hip flexion, external rotation, internal rotation, hip abduction, and hip adduction. No tenderness to palpation over the sciatic nerve bilaterally. No tenderness over the greater trochanter bilaterally. Minimal tenderness just inferior to the bilateral iliac crests laterally.       Assessment & Plan:

## 2010-05-28 NOTE — Assessment & Plan Note (Signed)
At this point I don't have an exact diagnosis for her, but I think a lot of it may be new anxiety and stress as well as increased time on her feet with her change in job position. She does not have a good mechanism of action for any particular injury and really has a nonfocal exam today. She is likely getting some mild hip abduction or gluteal tendinopathy at best. In addition this could be all related to her chronic low back pain, and even though it is bilateral hip pain I do not believe this is spinal cord disc-related pathology -Gave her hip abduction and IT band stretches to really focus on for the next 4-6 weeks -Continue use of tramadol and Mobic as needed. She is unclear as to her current dose of amitriptyline, so I told her to increase amitriptyline to 100 mg at bedtime it should not currently at that dose. -She is a poor candidate for narcotic pain medicine, so did not give this to her today -She inquired about a handicap sticker as well, but I told her she really does not necessitate it at this time -Followup in 4-6 weeks if no improvement

## 2010-05-30 ENCOUNTER — Other Ambulatory Visit: Payer: Self-pay | Admitting: *Deleted

## 2010-05-30 NOTE — Telephone Encounter (Signed)
Pt states she is taking tramadol 50 mg qid, and amitriptyline 100 mg qhs which she increased since last visit.  Per Dr. Nedra Hai ok for pt to increase tramadol to 6-8 tabs per day while having acute flare of hip pain.  Advised pt of this, and told her only to take 6-8 tabs per day when pain is severe- that she should not stay on this dosage long term.

## 2010-05-30 NOTE — Telephone Encounter (Signed)
Message copied by Resa Miner on Fri May 30, 2010  4:39 PM ------      Message from: Marily Memos      Created: Fri May 30, 2010 11:40 AM      Regarding: patient is requesting pain medicine      Contact: [phone]       She states her hip is worst then it was on Wed.

## 2010-07-02 ENCOUNTER — Ambulatory Visit (INDEPENDENT_AMBULATORY_CARE_PROVIDER_SITE_OTHER): Payer: Self-pay | Admitting: Family Medicine

## 2010-07-02 ENCOUNTER — Encounter: Payer: Self-pay | Admitting: Family Medicine

## 2010-07-02 VITALS — BP 111/74 | HR 96 | Ht 62.0 in | Wt 90.0 lb

## 2010-07-02 DIAGNOSIS — M25551 Pain in right hip: Secondary | ICD-10-CM

## 2010-07-02 DIAGNOSIS — M25559 Pain in unspecified hip: Secondary | ICD-10-CM

## 2010-07-02 MED ORDER — PREDNISONE 5 MG PO KIT: PACK | ORAL | Status: DC

## 2010-07-02 NOTE — Progress Notes (Signed)
  Subjective:    Patient ID: Melissa Walker, female    DOB: May 28, 1980, 30 y.o.   MRN: [id number]  HPI 30 year old female followup bilateral hip pain on the lateral aspects. Saw her one month ago, and we increased her tramadol to 4 times a day. I also gave her some hip stretches and IT band stretches as well as strengthening exercises. She states overall her pain is 70% improved. Her left side she has gotten completely better, but she persisted having pain in the right side. Pain is still located lateral hip superior to the greater trochanter in the region of her tensor fascial lata as well as possible insertion of external rotators. She still managing at [employer], and notices with prolonged standing as well as sitting she has pain there. Continues to deny radicular symptoms.   Review of Systems Denies fever, sweats, chills, weight loss    Objective:   Physical Exam Gen. appearance: Well-appearing skinny female in no distress Right hip: No tenderness of the right greater trochanter. Full range of motion without pain. Mental tenderness in the hip abductor muscle region superior to the greater trochanter. She has normal strength with hip flexion and external rotation, but mild weakness with hip abduction. Negative Pearlean Brownie and [employer].       Assessment & Plan:  Bilateral hip pain in the lateral region. I do not think this is a trochanter bursitis, rather I think this is more hip weakness and abductor/tensor fascial lata tendinopathy. -Continue tramadol, and amitriptyline at 50 mg bedtime. She states any higher dose of amitriptyline makes her forgetful. -We'll do a six-day pred taper to see if we can help her pain -Referral to physical therapy for stretching, strengthening, and modalities -Follow up with me in 4-6 weeks unless she is doing better, then prn

## 2010-07-15 NOTE — Group Therapy Note (Signed)
Melissa Walker, READER NO.:  [id number]   MEDICAL RECORD NO.:  [id number]          PATIENT TYPE:  WOC   LOCATION:  [facility]                   FACILITY:  [facility]   PHYSICIAN:  Tinnie Gens, MD        DATE OF BIRTH:  [birth year]   DATE OF SERVICE:  10/14/2006                                  CLINIC NOTE   CHIEF COMPLAINT:  Abnormal vaginal bleeding.   HISTORY OF PRESENT ILLNESS:  The patient is a 30 year old gravida 1,  para 1 who was previously seen here in February and was put on Ortho Tri-  Cyclen.  The patient has been on this medication since that time and had  been doing well until the last month where she developed some light  spotting that seemed to be persistent throughout the month.  She was  supposed to have a cycle at the end of last week, but she is unclear if  that was really a cycle.  She started a new pack of pills and her  bleeding has stopped currently.  The patient reports some lower and  upper abdominal pain and reports feeling very constipated.  She states  that she has a bowel movement once every 4 weeks to 2 months.  She says  the pain is stabbing in nature.  She has it daily.  She wonders if the  problem is related to her bladder.  She also reports hot flashes and is  unclear as to etiology of these.   PHYSICAL EXAMINATION:  VITAL SIGNS:  On exam her vitals are as in the  chart.  GENERAL:  She well-nourished female in no acute distress.  GU:  Normal external female genitalia.  BUS is normal.  Vagina is pink  and rugated.  Cervix is anterior and parous, no lesion.  Uterus is  retroverted and retroflexed, not specifically tender.  The bladder is  somewhat tender.  The adnexa without mass or tenderness.  The uterus is  also small, six weeks' size.   IMPRESSION:  1. Lower abdominal pain.  2. Constipation.  3. Abnormal vaginal bleeding on Tri-Cyclen oral contraceptive.  4. Hot flashes.   PLAN:  1. Will check her TSH.  2. Pelvic sonogram.  3. GC and chlamydia were obtained today.  4. Change her pill to a monophasic.  Ortho-Cept was given.  5. Treat constipation with Peri-Colace and FiberCon to see if this      improves her abdominal pain.  She will follow up in 6 weeks for      results.           ______________________________  Tinnie Gens, MD    TP/MEDQ  D:  10/14/2006  T:  10/15/2006  Job:  [id number]

## 2010-07-18 NOTE — Discharge Summary (Signed)
NAME:  Melissa Walker, Melissa Walker                      ACCOUNT NO.:  [id number]   MEDICAL RECORD NO.:  [id number]                   PATIENT TYPE:  INP   LOCATION:  9123                                 FACILITY:  [facility]   PHYSICIAN:  Gerrit Friends. Aldona Bar, M.D.                DATE OF BIRTH:  [birth year]   DATE OF ADMISSION:  09/20/2003  DATE OF DISCHARGE:                                 DISCHARGE SUMMARY   DISCHARGE DIAGNOSES:  1. Term pregnancy, delivered 7-pound 1-ounce female infant, Apgars 9 and 9.  2. Blood type A negative - RhoGAM given.   PROCEDURES:  1. Forceps delivery.  2. Repair of partial third degree tear.   SUMMARY:  This 30 year old primigravida was admitted at term in labor after  a benign pregnancy.  She progressed and because of maternal exhaustion  required assistance at delivery.  The patient was delivered by Dr. Henderson Cloud  and she used low Luikart forceps at +3 station to assist delivery which was  accomplished without difficulty with delivery of a 7-pound 1-ounce female  infant with Apgars of 9 and 9 over a partial third degree tear which was  repaired subsequently without difficulty.  The patient's postpartum course  was benign.  Her discharge hemoglobin was 9.4 with a white count of 17,000  and a platelet count of 221,000.  On the morning of July23, 2005 she was  stable, bottle feeding without difficulty, ambulating without difficulty,  tolerating a regular diet well, vital signs were stable, and she was ready  for discharge.  Accordingly she was given all appropriate instructions and  understood all instructions well.   DISCHARGE MEDICATIONS:  Ferrous sulfate 300 mg daily and she will finish her  prenatal vitamins.  She was also given a prescription for Motrin to use 600  mg q.6h. as needed for pain.  She did receive RhoGAM postpartum and, as  instructed, will return to the office for follow-up in approximately 4 weeks  time.   CONDITION ON DISCHARGE:  Improved.                                     Gerrit Friends. Aldona Bar, M.D.    RMW/MEDQ  D:  09/22/2003  T:  09/22/2003  Job:  [id number]

## 2010-07-29 ENCOUNTER — Inpatient Hospital Stay (INDEPENDENT_AMBULATORY_CARE_PROVIDER_SITE_OTHER)
Admission: RE | Admit: 2010-07-29 | Discharge: 2010-07-29 | Disposition: A | Discharge status: Home / Self Care | Payer: Self-pay | Source: Ambulatory Visit | Attending: Family Medicine | Admitting: Family Medicine

## 2010-07-29 DIAGNOSIS — J019 Acute sinusitis, unspecified: Secondary | ICD-10-CM

## 2010-07-29 DIAGNOSIS — J029 Acute pharyngitis, unspecified: Secondary | ICD-10-CM

## 2010-08-13 ENCOUNTER — Ambulatory Visit (INDEPENDENT_AMBULATORY_CARE_PROVIDER_SITE_OTHER): Payer: Self-pay | Admitting: Family Medicine

## 2010-08-13 ENCOUNTER — Encounter: Payer: Self-pay | Admitting: Family Medicine

## 2010-08-13 DIAGNOSIS — M25559 Pain in unspecified hip: Secondary | ICD-10-CM

## 2010-08-13 DIAGNOSIS — M25551 Pain in right hip: Secondary | ICD-10-CM

## 2010-08-13 DIAGNOSIS — M545 Low back pain: Secondary | ICD-10-CM

## 2010-08-13 MED ORDER — TRAMADOL HCL 50 MG PO TABS: 50.0000 mg | ORAL_TABLET | Freq: Four times a day (QID) | ORAL | Status: DC | PRN

## 2010-08-13 MED ORDER — METHOCARBAMOL 500 MG PO TABS: 500.0000 mg | ORAL_TABLET | Freq: Four times a day (QID) | ORAL | Status: DC | PRN

## 2010-08-13 NOTE — Progress Notes (Signed)
  Subjective:    Patient ID: Melissa Walker, female    DOB: [birth year], 29 y.o.   MRN: [id number]  HPI Melissa Walker comes in today to followup on her back and right hip. At last visit, I gave her a prednisone taper for 6 days which he states significantly helps both of her hips. However she states 4 days ago she had a very long shift at [employer] where she works, and flared up her right lateral hip and some right-sided low back pain and spasm. She still denies any radicular symptoms. She is questioning if a muscle relaxer might help. Occasionally is having some mild issues going from a squatted position to standing position because of the pain and mild weakness in her right hip. She never received a phone call from therapy after last visit. She has not done any physical therapy visits.  Review of Systems Denies fevers, sweats, chills, weight loss    Objective:   Physical Exam Gen. appearance: Skinny slightly unkempt female no distress Back: Positive mild tenderness and spasm of the right paralumbar musculature. No bony tenderness. No SI joint tenderness. Negative seated straight leg raise. Lower extremity neuro exam is completely intact. Hips: Right hip still with great range of motion without pain. No tenderness over the greater trochanter. She still points to the superior hip abductors and tensor fascial lata region to where her pain is.       Assessment & Plan:  Chronic low back and lateral hip pain with recent flare -Trial of Robaxin 500 mg every 6 hours when necessary #60 with no refills, warned regarding possible drowsiness side effects -Refilled her tramadol 50 mg every 6 hours when necessary #120 with 4 refills as she is chronically on this for her back -We'll have my staff contact therapy to see if the referral was received and then get [name] back in for one to 2 visits to review a home exercise program for her back and hip mostly for strength and decrease pain -Followup  with [place] as needed if she has anymore flares

## 2010-08-18 ENCOUNTER — Other Ambulatory Visit: Payer: Self-pay | Admitting: Obstetrics and Gynecology

## 2010-08-18 DIAGNOSIS — N63 Unspecified lump in unspecified breast: Secondary | ICD-10-CM

## 2010-08-19 ENCOUNTER — Ambulatory Visit: Payer: Self-pay | Attending: Family Medicine | Admitting: Physical Therapy

## 2010-08-19 DIAGNOSIS — IMO0001 Reserved for inherently not codable concepts without codable children: Secondary | ICD-10-CM | POA: Insufficient documentation

## 2010-08-19 DIAGNOSIS — M545 Low back pain, unspecified: Secondary | ICD-10-CM | POA: Insufficient documentation

## 2010-08-19 DIAGNOSIS — M25559 Pain in unspecified hip: Secondary | ICD-10-CM | POA: Insufficient documentation

## 2010-08-19 DIAGNOSIS — M2569 Stiffness of other specified joint, not elsewhere classified: Secondary | ICD-10-CM | POA: Insufficient documentation

## 2010-08-20 ENCOUNTER — Ambulatory Visit: Payer: Self-pay | Admitting: Physical Therapy

## 2010-09-01 ENCOUNTER — Ambulatory Visit: Payer: Self-pay | Attending: Family Medicine | Admitting: Physical Therapy

## 2010-09-01 DIAGNOSIS — M545 Low back pain, unspecified: Secondary | ICD-10-CM | POA: Insufficient documentation

## 2010-09-01 DIAGNOSIS — M2569 Stiffness of other specified joint, not elsewhere classified: Secondary | ICD-10-CM | POA: Insufficient documentation

## 2010-09-01 DIAGNOSIS — IMO0001 Reserved for inherently not codable concepts without codable children: Secondary | ICD-10-CM | POA: Insufficient documentation

## 2010-09-01 DIAGNOSIS — M25559 Pain in unspecified hip: Secondary | ICD-10-CM | POA: Insufficient documentation

## 2010-09-04 ENCOUNTER — Ambulatory Visit
Admission: RE | Admit: 2010-09-04 | Discharge: 2010-09-04 | Disposition: A | Discharge status: Home / Self Care | Payer: Self-pay | Source: Ambulatory Visit | Attending: Obstetrics and Gynecology | Admitting: Obstetrics and Gynecology

## 2010-09-04 DIAGNOSIS — N63 Unspecified lump in unspecified breast: Secondary | ICD-10-CM

## 2010-09-08 ENCOUNTER — Ambulatory Visit: Payer: Self-pay | Admitting: Physical Therapy

## 2010-09-11 ENCOUNTER — Ambulatory Visit: Payer: Self-pay | Admitting: Physical Therapy

## 2010-09-17 ENCOUNTER — Encounter: Payer: Self-pay | Admitting: Physical Therapy

## 2010-09-19 ENCOUNTER — Ambulatory Visit: Payer: Self-pay | Admitting: Physical Therapy

## 2010-09-19 ENCOUNTER — Other Ambulatory Visit: Payer: Self-pay | Admitting: *Deleted

## 2010-09-19 DIAGNOSIS — G8929 Other chronic pain: Secondary | ICD-10-CM

## 2010-09-19 MED ORDER — METHOCARBAMOL 500 MG PO TABS: 500.0000 mg | ORAL_TABLET | Freq: Four times a day (QID) | ORAL | Status: AC | PRN

## 2010-09-19 NOTE — Progress Notes (Signed)
Per pt TENs unit has helped her significantly at PT.  She is requesting one for home use.  Approved by Dr. Jennette Kettle.

## 2010-09-23 ENCOUNTER — Ambulatory Visit: Payer: Self-pay | Admitting: Physical Therapy

## 2010-09-24 ENCOUNTER — Ambulatory Visit: Payer: Self-pay | Admitting: Physical Therapy

## 2010-09-29 ENCOUNTER — Ambulatory Visit: Payer: Self-pay | Admitting: Physical Therapy

## 2010-10-02 ENCOUNTER — Ambulatory Visit: Payer: Self-pay | Attending: Family Medicine | Admitting: Physical Therapy

## 2010-10-02 DIAGNOSIS — M25559 Pain in unspecified hip: Secondary | ICD-10-CM | POA: Insufficient documentation

## 2010-10-02 DIAGNOSIS — M545 Low back pain, unspecified: Secondary | ICD-10-CM | POA: Insufficient documentation

## 2010-10-02 DIAGNOSIS — IMO0001 Reserved for inherently not codable concepts without codable children: Secondary | ICD-10-CM | POA: Insufficient documentation

## 2010-10-02 DIAGNOSIS — M2569 Stiffness of other specified joint, not elsewhere classified: Secondary | ICD-10-CM | POA: Insufficient documentation

## 2010-10-07 ENCOUNTER — Ambulatory Visit: Payer: Self-pay | Admitting: Physical Therapy

## 2010-10-10 ENCOUNTER — Ambulatory Visit: Payer: Self-pay | Admitting: Physical Therapy

## 2010-10-15 ENCOUNTER — Ambulatory Visit: Payer: Self-pay | Admitting: Physical Therapy

## 2010-10-22 ENCOUNTER — Ambulatory Visit: Payer: Self-pay | Admitting: Physical Therapy

## 2010-10-30 ENCOUNTER — Ambulatory Visit (INDEPENDENT_AMBULATORY_CARE_PROVIDER_SITE_OTHER): Payer: Self-pay | Admitting: Family Medicine

## 2010-10-30 ENCOUNTER — Encounter: Payer: Self-pay | Admitting: Family Medicine

## 2010-10-30 VITALS — BP 120/73 | HR 103

## 2010-10-30 DIAGNOSIS — IMO0002 Reserved for concepts with insufficient information to code with codable children: Secondary | ICD-10-CM

## 2010-10-30 DIAGNOSIS — G8929 Other chronic pain: Secondary | ICD-10-CM

## 2010-10-30 DIAGNOSIS — M5416 Radiculopathy, lumbar region: Secondary | ICD-10-CM

## 2010-10-30 DIAGNOSIS — M533 Sacrococcygeal disorders, not elsewhere classified: Secondary | ICD-10-CM

## 2010-10-31 NOTE — Progress Notes (Signed)
Subjective:    Patient ID: Melissa Walker, female    DOB: January 17, 1981, 30 y.o.   MRN: [id number]  HPI  Melissa Walker is coming today to f/u on her right leg pain. She tried medrol dose pack with no improvement, she also did PT with no much benefit for her pain but she did increase her core strength. We do not have a note from PT. She states that her  pain is located on her R gluteal area and radiates down to her leg, her pain is 9/10, sharp, on and off, but sometimes constant, worse with activities, improved by rest. No numbness or tingling distally, no weakness. No urinary or fecal incontinence no saddle anesthesia.   Review of Systems  Constitutional: Negative for fever, chills and fatigue.  Musculoskeletal: Positive for back pain. Negative for joint swelling.       R gluteal /leg pain with hpi  Neurological: Negative for tremors, weakness and numbness.    Patient Active Problem List  Diagnoses  . MOLE  . TOBACCO ABUSE  . ACNE, MILD  . SKIN LESION  . SHOULDER PAIN, RIGHT  . ANKLE PAIN, LEFT  . LOW BACK PAIN, CHRONIC  . OTHER DISORDER OF COCCYX  . LEG PAIN, BILATERAL  . LORDOSIS ACQUIRED POSTURAL  . HEADACHE  . NECK MASS  . Hip pain, bilateral   Current Outpatient Prescriptions on File Prior to Visit  Medication Sig Dispense Refill  . amitriptyline (ELAVIL) 50 MG tablet Take 50 mg by mouth at bedtime.        . meloxicam (MOBIC) 7.5 MG tablet Take 7.5 mg by mouth daily.        . PredniSONE 5 MG KIT Take as directed  21 each  0  . traMADol (ULTRAM) 50 MG tablet Take 1 tablet (50 mg total) by mouth every 6 (six) hours as needed.  120 tablet  4   Allergies  Allergen Reactions  . Nicotine     REACTION: Local redness at site.  Marland Kitchen Penicillins     REACTION: uncle highly allergic so was told whole family should not take it         Objective:   Physical Exam  Constitutional: She appears well-developed and well-nourished.       BP 120/73  Pulse 103   Eyes: EOM are  normal.  Pulmonary/Chest: Effort normal.  Musculoskeletal:       Low back with intact skin. No swelling, no hematomas. FROM for flexion, extension, rotation and lateralization.  Tenderness to palpation on R SI joint area. Faber test positive for  R SI joint pain. Straight leg raise negative B/L. Strength 5/5 for hip flexion and extension, 5/5 for knee flexion and extension, 5/5 for ankle plantar and dorsal flexion B/L. DTR patellar and achilles II/IV B/L Sensation intact distally B/L. No leg discrepancy.   Neurological: She is alert.  Skin: Skin is warm. No rash noted. No erythema. No pallor.  Psychiatric: She has a normal mood and affect. Judgment normal.     After obtaining consent, the skin of the R SI joint was sterilely prepped with alcohol swabs, ethyl will chloride was used for local topical anesthesia injection of 4 mL 1% lidocaine plus one mL of Kenalog was injected in the R SI joint. The procedure was well-tolerated by the patient. Patient instructed to remain in clinic for 20 minutes afterwards, and to report any adverse reaction to me immediately.       Assessment & Plan:  1. Right lumbar radiculopathy   2. Chronic right SI joint pain    She had a very impressive relieve of her pain after the R SI joint injection. We recommended and showed her SI joint joint stretches Start amytriptiline  at HS Cont mobic Cont tramadol prn -Follow up 4 weeks

## 2010-11-07 ENCOUNTER — Other Ambulatory Visit: Payer: Self-pay | Admitting: *Deleted

## 2010-11-07 DIAGNOSIS — M79606 Pain in leg, unspecified: Secondary | ICD-10-CM

## 2010-11-07 NOTE — Progress Notes (Signed)
Pt called stating she is still having rt leg pain pain, and the injection only helped for a couple of hours.  Dr. Ashley Jacobs ordered MRI for further evaluation.  Pt scheduled for MRI 11/13/10 at 4:45pm- pt notified of appt info.

## 2010-11-13 ENCOUNTER — Ambulatory Visit (HOSPITAL_COMMUNITY)
Admission: RE | Admit: 2010-11-13 | Discharge: 2010-11-13 | Disposition: A | Discharge status: Home / Self Care | Payer: Self-pay | Source: Ambulatory Visit | Attending: Family Medicine | Admitting: Family Medicine

## 2010-11-13 DIAGNOSIS — M47817 Spondylosis without myelopathy or radiculopathy, lumbosacral region: Secondary | ICD-10-CM | POA: Insufficient documentation

## 2010-11-13 DIAGNOSIS — M545 Low back pain, unspecified: Secondary | ICD-10-CM | POA: Insufficient documentation

## 2010-11-13 DIAGNOSIS — M79606 Pain in leg, unspecified: Secondary | ICD-10-CM

## 2010-11-13 DIAGNOSIS — M519 Unspecified thoracic, thoracolumbar and lumbosacral intervertebral disc disorder: Secondary | ICD-10-CM | POA: Insufficient documentation

## 2010-11-13 DIAGNOSIS — M51379 Other intervertebral disc degeneration, lumbosacral region without mention of lumbar back pain or lower extremity pain: Secondary | ICD-10-CM | POA: Insufficient documentation

## 2010-11-13 DIAGNOSIS — M5137 Other intervertebral disc degeneration, lumbosacral region: Secondary | ICD-10-CM | POA: Insufficient documentation

## 2010-11-18 ENCOUNTER — Other Ambulatory Visit (INDEPENDENT_AMBULATORY_CARE_PROVIDER_SITE_OTHER): Payer: Self-pay | Admitting: Family Medicine

## 2010-11-18 ENCOUNTER — Encounter: Payer: Self-pay | Admitting: *Deleted

## 2010-11-18 DIAGNOSIS — M479 Spondylosis, unspecified: Secondary | ICD-10-CM

## 2010-11-18 DIAGNOSIS — M5126 Other intervertebral disc displacement, lumbar region: Secondary | ICD-10-CM

## 2010-11-18 DIAGNOSIS — IMO0002 Reserved for concepts with insufficient information to code with codable children: Secondary | ICD-10-CM

## 2010-11-18 NOTE — Progress Notes (Signed)
  Subjective:    Patient ID: Melissa Walker, female    DOB: Feb 28, 1981, 30 y.o.   MRN: [id number]  HPI  I spoke with ms. Line berry over the phone regarding her recent L spine MRI results. I recommended a ESI as she has failed conservative treatment. She understood and agrred with the treatment. We will schedule the injection for this coming Thursday.  Review of Systems     Objective:   Physical Exam        Assessment & Plan:   1. Lumbar herniated disc  PR INJECT ANES/STEROID FORAMEN LUMBAR/SACRAL W IMG GUIDE ,1 LEVEL  2. DDD (degenerative disc disease)  DG Epidurography, PR INJECT ANES/STEROID FORAMEN LUMBAR/SACRAL W IMG GUIDE ,1 LEVEL  3. Spondylosis  DG Epidurography

## 2010-11-18 NOTE — Patient Instructions (Signed)
PT [facility] TO HAVE L5-S1 INJECTION AT [facility] ON THURS SEPT 20TH AT [address]. [address]  PT CALLED AND INFORMED OF THIS APPT.

## 2010-11-19 ENCOUNTER — Other Ambulatory Visit: Payer: Self-pay | Admitting: *Deleted

## 2010-11-19 DIAGNOSIS — IMO0002 Reserved for concepts with insufficient information to code with codable children: Secondary | ICD-10-CM

## 2010-11-19 DIAGNOSIS — M479 Spondylosis, unspecified: Secondary | ICD-10-CM

## 2010-11-20 ENCOUNTER — Ambulatory Visit
Admission: RE | Admit: 2010-11-20 | Discharge: 2010-11-20 | Disposition: A | Discharge status: Home / Self Care | Payer: PRIVATE HEALTH INSURANCE | Source: Ambulatory Visit | Attending: Family Medicine | Admitting: Family Medicine

## 2010-11-20 MED ORDER — METHYLPREDNISOLONE ACETATE 40 MG/ML INJ SUSP (RADIOLOG
120.0000 mg | Freq: Once | INTRAMUSCULAR | Status: AC
  Administered 2010-11-20: 120 mg via EPIDURAL

## 2010-11-20 MED ORDER — IOHEXOL 180 MG/ML  SOLN
1.0000 mL | Freq: Once | INTRAMUSCULAR | Status: AC | PRN
  Administered 2010-11-20: 1 mL via EPIDURAL

## 2010-11-21 ENCOUNTER — Other Ambulatory Visit: Payer: Self-pay

## 2010-11-27 ENCOUNTER — Ambulatory Visit (INDEPENDENT_AMBULATORY_CARE_PROVIDER_SITE_OTHER): Payer: Self-pay | Admitting: Family Medicine

## 2010-11-27 VITALS — BP 120/80

## 2010-11-27 DIAGNOSIS — IMO0002 Reserved for concepts with insufficient information to code with codable children: Secondary | ICD-10-CM

## 2010-11-27 DIAGNOSIS — M5416 Radiculopathy, lumbar region: Secondary | ICD-10-CM

## 2010-11-27 DIAGNOSIS — M5126 Other intervertebral disc displacement, lumbar region: Secondary | ICD-10-CM | POA: Insufficient documentation

## 2010-11-27 NOTE — Progress Notes (Signed)
  Subjective:    Patient ID: Melissa Walker, female    DOB: [birth year], 30 y.o.   MRN: [id number]  HPI Melissa Walker is coming today to F/U on her back pain. She had an MRI of her L spine which reported herniated disc L5-S1. She was complaining of right lower extremity radiculopathy. She failed conservative treatment PT and, medications. We referred her for an ESI in her L spine which she had done last Thursday 11/20/10. She states that she is feeling much better. She is 60% better. Her right leg pain has disappeared. She still has mild posterior low back pain, 2/10 intensity,dull, better with rest.Worse with long walking.  Patient Active Problem List  Diagnoses  . MOLE  . TOBACCO ABUSE  . ACNE, MILD  . SKIN LESION  . SHOULDER PAIN, RIGHT  . ANKLE PAIN, LEFT  . LOW BACK PAIN, CHRONIC  . OTHER DISORDER OF COCCYX  . LEG PAIN, BILATERAL  . LORDOSIS ACQUIRED POSTURAL  . HEADACHE  . NECK MASS  . Hip pain, bilateral  . Radiculopathy, lumbar region  . Lumbar herniated disc   Current Outpatient Prescriptions on File Prior to Visit  Medication Sig Dispense Refill  . amitriptyline (ELAVIL) 50 MG tablet Take 50 mg by mouth at bedtime.        . meloxicam (MOBIC) 7.5 MG tablet Take 7.5 mg by mouth daily.        . PredniSONE 5 MG KIT Take as directed  21 each  0  . traMADol (ULTRAM) 50 MG tablet Take 1 tablet (50 mg total) by mouth every 6 (six) hours as needed.  120 tablet  4   Allergies  Allergen Reactions  . Nicotine     REACTION: Local redness at site.  Marland Kitchen Penicillins     REACTION: uncle highly allergic so was told whole family should not take it       Review of Systems  Constitutional: Negative for fever, chills and fatigue.  Genitourinary: Negative for urgency and decreased urine volume.  Musculoskeletal: Positive for back pain. Negative for joint swelling, arthralgias and gait problem.  Neurological: Negative for seizures, syncope and numbness.       Objective:   Physical  Exam  Constitutional: She is oriented to person, place, and time. She appears well-developed and well-nourished.       BP 120/80   Musculoskeletal:       Low back with intact skin. No swelling, no hematomas. FROM for flexion, extension, rotation and lateralization.  Mild TTP in lumbar area.Tenderness to palpation on R SI joint area. Faber test positive for SI joint pain. Straight leg raise negative B/L. Strength 5/5 for hip flexion and extension, 5/5 for knee flexion and extension, 5/5 for ankle plantar and dorsal flexion B/L. DTR patellar and achilles II/IV B/L Sensation intact distally B/L. No leg discrepancy.   Neurological: She is alert and oriented to person, place, and time.  Skin: Skin is warm. No rash noted. No erythema. No pallor.  Psychiatric: She has a normal mood and affect.          Assessment & Plan:   1. Lumbar herniated disc   2. Radiculopathy, lumbar region    She is 1 week s/p ESI lumbar injection. She has had positive response. 60% better. Cont back and SI joint stretches. F/U in 6 weeks. We will repeat ESI if needed in 4-6 week.

## 2010-12-15 LAB — WET PREP, GENITAL
Clue Cells Wet Prep HPF POC: NONE SEEN
Trich, Wet Prep: NONE SEEN

## 2010-12-15 LAB — POCT URINALYSIS DIP (DEVICE)
Bilirubin Urine: NEGATIVE
Glucose, UA: NEGATIVE
Ketones, ur: NEGATIVE
Nitrite: NEGATIVE

## 2010-12-15 LAB — POCT PREGNANCY, URINE
Operator id: 239701
Preg Test, Ur: NEGATIVE

## 2010-12-24 ENCOUNTER — Other Ambulatory Visit: Payer: Self-pay | Admitting: Sports Medicine

## 2011-01-08 ENCOUNTER — Ambulatory Visit (INDEPENDENT_AMBULATORY_CARE_PROVIDER_SITE_OTHER): Payer: Self-pay | Admitting: Family Medicine

## 2011-01-08 VITALS — BP 132/77

## 2011-01-08 DIAGNOSIS — M5416 Radiculopathy, lumbar region: Secondary | ICD-10-CM

## 2011-01-08 DIAGNOSIS — M5126 Other intervertebral disc displacement, lumbar region: Secondary | ICD-10-CM

## 2011-01-08 DIAGNOSIS — IMO0002 Reserved for concepts with insufficient information to code with codable children: Secondary | ICD-10-CM

## 2011-01-08 MED ORDER — NEURONTIN 300 MG PO CAPS: ORAL_CAPSULE | ORAL | Status: AC

## 2011-01-08 NOTE — Progress Notes (Signed)
  Subjective:    Patient ID: Melissa Walker, female    DOB: 1980-11-11, 30 y.o.   MRN: [id number]  HPI Ms. Tamburro is a pleasant 30 yo female patient coming to F/U on her right lumbar radiculopathy. She had a ESI done on 11/21/10 . She sated that immediately after the injection she felt great, after her pain gradually returned , but she improved 50% with the injection. She was feeling great until 2 weeks ago that her pain returned. She states that her pain is now the same than before.  She states that her pain is located on her R gluteal area and radiates down to her leg, her pain is 9/10, sharp, on and off, but sometimes constant, worse with activities, improved by rest. No numbness or tingling distally, no weakness. No urinary or fecal incontinence no saddle anesthesia.   Patient Active Problem List  Diagnoses  . MOLE  . TOBACCO ABUSE  . ACNE, MILD  . SKIN LESION  . SHOULDER PAIN, RIGHT  . ANKLE PAIN, LEFT  . LOW BACK PAIN, CHRONIC  . OTHER DISORDER OF COCCYX  . LEG PAIN, BILATERAL  . LORDOSIS ACQUIRED POSTURAL  . HEADACHE  . NECK MASS  . Hip pain, bilateral  . Radiculopathy, lumbar region  . Lumbar herniated disc   Current Outpatient Prescriptions on File Prior to Visit  Medication Sig Dispense Refill  . amitriptyline (ELAVIL) 50 MG tablet TAKE ONE TABLET BY MOUTH TWICE DAILY  60 tablet  0  . meloxicam (MOBIC) 7.5 MG tablet Take 7.5 mg by mouth daily.        . PredniSONE 5 MG KIT Take as directed  21 each  0  . traMADol (ULTRAM) 50 MG tablet Take 1 tablet (50 mg total) by mouth every 6 (six) hours as needed.  120 tablet  4    Allergies  Allergen Reactions  . Nicotine     REACTION: Local redness at site.  Marland Kitchen Penicillins     REACTION: uncle highly allergic so was told whole family should not take it     Review of Systems  Constitutional: Negative for fever, chills, diaphoresis and fatigue.  HENT: Negative for neck pain.   Musculoskeletal: Positive for back pain.  Negative for joint swelling and gait problem.  Neurological: Positive for numbness. Negative for weakness.       Objective:   Physical Exam  Constitutional: She is oriented to person, place, and time. She appears well-developed and well-nourished.       BP 132/77   Neck: Normal range of motion. Neck supple. No thyromegaly present.  Pulmonary/Chest: Effort normal.  Musculoskeletal:        Low back with intact skin. No swelling, no hematomas. FROM for flexion, extension, rotation and lateralization.  Tenderness to palpation on R SI joint area. Faber test positive for  R SI joint pain. Straight leg raise negative B/L. Strength 5/5 for hip flexion and extension, 5/5 for knee flexion and extension, 5/5 for ankle plantar and dorsal flexion B/L. DTR patellar and achilles II/IV B/L Sensation intact distally B/L. No leg discrepancy  Neurological: She is alert and oriented to person, place, and time.  Skin: Skin is warm. No rash noted. No erythema. No pallor.  Psychiatric: She has a normal mood and affect. Her behavior is normal. Judgment and thought content normal.          Assessment & Plan:

## 2011-01-09 ENCOUNTER — Other Ambulatory Visit: Payer: Self-pay | Admitting: Family Medicine

## 2011-01-09 DIAGNOSIS — M549 Dorsalgia, unspecified: Secondary | ICD-10-CM

## 2011-01-09 NOTE — Progress Notes (Signed)
Addended by: Annita Brod on: 01/09/2011 09:07 AM   Modules accepted: Orders

## 2011-01-09 NOTE — Patient Instructions (Signed)
ESI at [facility] [address] location 11.29.12 @ 10:30 Arrive at [employer] for Registration Dr Carlota Raspberry will be doing the [facility]

## 2011-01-29 ENCOUNTER — Ambulatory Visit
Admission: RE | Admit: 2011-01-29 | Discharge: 2011-01-29 | Disposition: A | Discharge status: Home / Self Care | Payer: PRIVATE HEALTH INSURANCE | Source: Ambulatory Visit | Attending: Family Medicine | Admitting: Family Medicine

## 2011-01-29 VITALS — BP 133/76 | HR 80

## 2011-01-29 DIAGNOSIS — M5416 Radiculopathy, lumbar region: Secondary | ICD-10-CM

## 2011-01-29 DIAGNOSIS — M549 Dorsalgia, unspecified: Secondary | ICD-10-CM

## 2011-01-29 DIAGNOSIS — M5126 Other intervertebral disc displacement, lumbar region: Secondary | ICD-10-CM

## 2011-01-29 MED ORDER — METHYLPREDNISOLONE ACETATE 40 MG/ML INJ SUSP (RADIOLOG
120.0000 mg | Freq: Once | INTRAMUSCULAR | Status: AC
  Administered 2011-01-29: 120 mg via EPIDURAL

## 2011-01-29 MED ORDER — IOHEXOL 180 MG/ML  SOLN
1.0000 mL | Freq: Once | INTRAMUSCULAR | Status: AC | PRN
  Administered 2011-01-29: 1 mL via EPIDURAL

## 2011-03-05 ENCOUNTER — Ambulatory Visit (INDEPENDENT_AMBULATORY_CARE_PROVIDER_SITE_OTHER): Payer: Self-pay | Admitting: Family Medicine

## 2011-03-05 VITALS — BP 122/70

## 2011-03-05 DIAGNOSIS — M545 Low back pain, unspecified: Secondary | ICD-10-CM

## 2011-03-05 DIAGNOSIS — M5416 Radiculopathy, lumbar region: Secondary | ICD-10-CM

## 2011-03-05 DIAGNOSIS — IMO0002 Reserved for concepts with insufficient information to code with codable children: Secondary | ICD-10-CM

## 2011-03-05 MED ORDER — PREDNISONE 5 MG PO KIT: PACK | ORAL | Status: DC

## 2011-03-05 NOTE — Progress Notes (Signed)
  Subjective:    Patient ID: Melissa Walker, female    DOB: May 13, 1980, 31 y.o.   MRN: [id number]  HPI  Melissa Walker is  Coming to f/u on her right lower extremity lumbar radiculopathy. She had her second SEI on 01/09/11 , she states that she did not had any relieve after the second injection as she did with the first. She states that she still has a right lower extremity radiculopathy, her pain is on and off, 3/10, sharp, on and off numbness and tingling. No weakness, no saddle anesthesia, no urinary or fecal incontinence.  Patient Active Problem List  Diagnoses  . MOLE  . TOBACCO ABUSE  . ACNE, MILD  . SKIN LESION  . SHOULDER PAIN, RIGHT  . ANKLE PAIN, LEFT  . LOW BACK PAIN, CHRONIC  . OTHER DISORDER OF COCCYX  . LEG PAIN, BILATERAL  . LORDOSIS ACQUIRED POSTURAL  . HEADACHE  . NECK MASS  . Hip pain, bilateral  . Radiculopathy, lumbar region  . Lumbar herniated disc   Current Outpatient Prescriptions on File Prior to Visit  Medication Sig Dispense Refill  . amitriptyline (ELAVIL) 50 MG tablet TAKE ONE TABLET BY MOUTH TWICE DAILY  60 tablet  0  . meloxicam (MOBIC) 7.5 MG tablet Take 7.5 mg by mouth daily.        Marland Kitchen NEURONTIN 300 MG capsule Take 1 capsule at bed time during the first week. Then take 1 capsule twice a day .  90 capsule  1  . traMADol (ULTRAM) 50 MG tablet Take 1 tablet (50 mg total) by mouth every 6 (six) hours as needed.  120 tablet  4   Allergies  Allergen Reactions  . Nicotine     REACTION: Local redness at site.  Marland Kitchen Penicillins     REACTION: uncle highly allergic so was told whole family should not take it     Review of Systems  Constitutional: Negative for fever, chills, diaphoresis and fatigue.  Musculoskeletal: Positive for back pain. Negative for myalgias, joint swelling, arthralgias and gait problem.  Neurological: Negative for dizziness, weakness and numbness.       Objective:   Physical Exam  Constitutional: She is oriented to person,  place, and time. She appears well-developed and well-nourished.       BP 122/70   Pulmonary/Chest: Effort normal.  Musculoskeletal:       Low back with intact skin. No swelling, no hematomas. FROM for flexion, extension, rotation and lateralization.  Tenderness to palpation on the right para lumbar area Straight leg raise negative B/L. Strength 5/5 for hip flexion and extension, 5/5 for knee flexion and extension, 5/5 for ankle plantar and dorsal flexion B/L. DTR patellar and achilles II/IV B/L Sensation intact distally B/L. No leg discrepancy.   Neurological: She is alert and oriented to person, place, and time.  Skin: Skin is warm. No erythema.  Psychiatric: She has a normal mood and affect. Her behavior is normal.          Assessment & Plan:   1. Radiculopathy, lumbar region  PredniSONE (STERAPRED 12 DAY) 5 MG KIT  2. LOW BACK PAIN, CHRONIC  PredniSONE (STERAPRED 12 DAY) 5 MG KIT    Increase neurontin to 600 mg at bed time and 300 mg in am Sterapred 5 mg pack for 12 days Continue back HEP F/U in 1st week of march and will discussed 3rd epidural injection

## 2011-03-05 NOTE — Patient Instructions (Addendum)
  Increase neurontin to 600 mg at bed time and 300 mg in am Sterapred 5 mg pack for 10 days Continue back HEP F/U in 1st week of march and will discussed 3rd epidural injection

## 2011-04-15 ENCOUNTER — Other Ambulatory Visit: Payer: Self-pay | Admitting: Family Medicine

## 2011-04-15 NOTE — Telephone Encounter (Signed)
Refill request

## 2011-04-17 NOTE — Telephone Encounter (Signed)
Refill request

## 2011-05-07 ENCOUNTER — Encounter: Payer: Self-pay | Admitting: Family Medicine

## 2011-05-07 ENCOUNTER — Ambulatory Visit (INDEPENDENT_AMBULATORY_CARE_PROVIDER_SITE_OTHER): Payer: Self-pay | Admitting: Family Medicine

## 2011-05-07 ENCOUNTER — Other Ambulatory Visit: Payer: Self-pay | Admitting: Family Medicine

## 2011-05-07 VITALS — BP 118/77 | HR 109

## 2011-05-07 DIAGNOSIS — M549 Dorsalgia, unspecified: Secondary | ICD-10-CM

## 2011-05-07 DIAGNOSIS — M5116 Intervertebral disc disorders with radiculopathy, lumbar region: Secondary | ICD-10-CM

## 2011-05-07 DIAGNOSIS — M545 Low back pain: Secondary | ICD-10-CM

## 2011-05-07 DIAGNOSIS — M5126 Other intervertebral disc displacement, lumbar region: Secondary | ICD-10-CM

## 2011-05-07 MED ORDER — METHOCARBAMOL 500 MG PO TABS: 500.0000 mg | ORAL_TABLET | Freq: Three times a day (TID) | ORAL | Status: DC

## 2011-05-07 MED ORDER — GABAPENTIN 600 MG PO TABS: 600.0000 mg | ORAL_TABLET | Freq: Two times a day (BID) | ORAL | Status: DC

## 2011-05-07 MED ORDER — AMITRIPTYLINE HCL 50 MG PO TABS: 25.0000 mg | ORAL_TABLET | Freq: Every day | ORAL | Status: DC

## 2011-05-07 NOTE — Progress Notes (Signed)
Subjective:    Patient ID: Melissa Walker, female    DOB: February 08, 1981, 31 y.o.   MRN: [id number]  HPI Melissa Walker is a pleasant 31 years old patient come today to followup on her right lumbar radiculopathy. She was seen previously month ago and started on a Medrol Dosepak, also we increased her Neurontin dose to 600 mg at night in 300 in the morning. She states this treatment help her thumb, however she still having radicular symptoms such as numbness and tingling down her right lower extremity. She also has sharp pain on an already it distally to right lower extremity. She denies any weakness. She denies any urinary or fecal incontinence. Denies any saddle anesthesia. She has had 2 epidural injections in the last 6 month. The first one helped her tremendously the second one not as much.  Patient Active Problem List  Diagnoses  . MOLE  . TOBACCO ABUSE  . ACNE, MILD  . SKIN LESION  . SHOULDER PAIN, RIGHT  . ANKLE PAIN, LEFT  . LOW BACK PAIN, CHRONIC  . OTHER DISORDER OF COCCYX  . LEG PAIN, BILATERAL  . LORDOSIS ACQUIRED POSTURAL  . HEADACHE  . NECK MASS  . Hip pain, bilateral  . Radiculopathy, lumbar region  . Lumbar herniated disc     Current Outpatient Prescriptions on File Prior to Visit  Medication Sig Dispense Refill  . meloxicam (MOBIC) 7.5 MG tablet Take 7.5 mg by mouth daily.        . PredniSONE (STERAPRED 12 DAY) 5 MG KIT Follow package instructions   1 kit  0  . traMADol (ULTRAM) 50 MG tablet Take 1 tablet (50 mg total) by mouth every 6 (six) hours as needed.  120 tablet  4   Allergies  Allergen Reactions  . Nicotine     REACTION: Local redness at site.  Marland Kitchen Penicillins     REACTION: uncle highly allergic so was told whole family should not take it        Review of Systems  Constitutional: Negative for fever, chills, diaphoresis and fatigue.  Musculoskeletal: Positive for back pain. Negative for joint swelling, arthralgias and gait problem.  Neurological:  Negative for dizziness, weakness and numbness.       Objective:   Physical Exam  Constitutional: She is oriented to person, place, and time. She appears well-developed and well-nourished.       BP 118/77  Pulse 109   Pulmonary/Chest: Effort normal.  Musculoskeletal:       Low back with intact skin. No swelling, no hematomas. FROM for flexion, extension, rotation and lateralization.  No Tenderness to palpation on SI joint area B/L. Faber test negative for SI joint pain. Straight leg raise negative B/L. Strength 5/5 for hip flexion and extension, 5/5 for knee flexion and extension, 5/5 for ankle plantar and dorsal flexion B/L. DTR patellar and achilles II/IV B/L Sensation intact distally B/L. No leg discrepancy.   Neurological: She is alert and oriented to person, place, and time.  Skin: Skin is warm.  Psychiatric: She has a normal mood and affect. Her behavior is normal. Thought content normal.          Assessment & Plan:   1. LOW BACK PAIN, CHRONIC  Ambulatory epidural steroid injection, amitriptyline (ELAVIL) 50 MG tablet, methocarbamol (ROBAXIN) 500 MG tablet, gabapentin (NEURONTIN) 600 MG tablet, DISCONTINUED: gabapentin (NEURONTIN) 600 MG tablet  2. Lumbar disc herniation with radiculopathy  Ambulatory epidural steroid injection, amitriptyline (ELAVIL) 50 MG tablet, methocarbamol (ROBAXIN)  500 MG tablet, gabapentin (NEURONTIN) 600 MG tablet, DISCONTINUED: gabapentin (NEURONTIN) 600 MG tablet   We will referred her for a third epidural injection We will increase her neurontin to 600 mg po bid Refill for elavil given' Refill for neurontin given Refill for robaxin F/U after ESI.

## 2011-05-07 NOTE — Patient Instructions (Signed)
You have been scheduled for an appt for your 3rd epidural steroid injection on 05/28/11 at 8:45am.  You will need someone to drive you.  The address is [address] [place] [address] [phone].

## 2011-05-28 ENCOUNTER — Ambulatory Visit
Admission: RE | Admit: 2011-05-28 | Discharge: 2011-05-28 | Disposition: A | Discharge status: Home / Self Care | Payer: PRIVATE HEALTH INSURANCE | Source: Ambulatory Visit | Attending: Family Medicine | Admitting: Family Medicine

## 2011-05-28 VITALS — BP 131/57 | HR 101

## 2011-05-28 DIAGNOSIS — M549 Dorsalgia, unspecified: Secondary | ICD-10-CM

## 2011-05-28 MED ORDER — METHYLPREDNISOLONE ACETATE 40 MG/ML INJ SUSP (RADIOLOG
120.0000 mg | Freq: Once | INTRAMUSCULAR | Status: AC
  Administered 2011-05-28: 120 mg via EPIDURAL

## 2011-05-28 MED ORDER — IOHEXOL 180 MG/ML  SOLN
1.0000 mL | Freq: Once | INTRAMUSCULAR | Status: AC | PRN
  Administered 2011-05-28: 1 mL via EPIDURAL

## 2011-05-28 NOTE — Discharge Instructions (Signed)

## 2011-07-02 ENCOUNTER — Ambulatory Visit (INDEPENDENT_AMBULATORY_CARE_PROVIDER_SITE_OTHER): Payer: Self-pay | Admitting: Family Medicine

## 2011-07-02 VITALS — BP 120/70

## 2011-07-02 DIAGNOSIS — IMO0002 Reserved for concepts with insufficient information to code with codable children: Secondary | ICD-10-CM

## 2011-07-02 DIAGNOSIS — M5416 Radiculopathy, lumbar region: Secondary | ICD-10-CM

## 2011-07-02 DIAGNOSIS — M545 Low back pain: Secondary | ICD-10-CM

## 2011-07-02 DIAGNOSIS — M5126 Other intervertebral disc displacement, lumbar region: Secondary | ICD-10-CM

## 2011-07-02 MED ORDER — TRAMADOL HCL 50 MG PO TABS: 50.0000 mg | ORAL_TABLET | Freq: Four times a day (QID) | ORAL | Status: DC | PRN

## 2011-07-02 NOTE — Progress Notes (Signed)
  Subjective:    Patient ID: Melissa Walker, female    DOB: December 06, 1980, 31 y.o.   MRN: [id number]  HPI  She had the third ESI on 05/29/11, she states that she is 60% better but she is not pain free, and still has pain radiated to her left foot. Mild numbness and tingling. No weakness. No incontinence. She has done well with neurontin 600 mg po bid. She would like tio be referred to a spine surgeon to explore her surgical options.    Patient Active Problem List  Diagnoses  . MOLE  . TOBACCO ABUSE  . ACNE, MILD  . SKIN LESION  . SHOULDER PAIN, RIGHT  . ANKLE PAIN, LEFT  . LOW BACK PAIN, CHRONIC  . OTHER DISORDER OF COCCYX  . LEG PAIN, BILATERAL  . LORDOSIS ACQUIRED POSTURAL  . HEADACHE  . NECK MASS  . Hip pain, bilateral  . Radiculopathy, lumbar region  . Lumbar herniated disc   Current Outpatient Prescriptions on File Prior to Visit  Medication Sig Dispense Refill  . amitriptyline (ELAVIL) 50 MG tablet Take 0.5 tablets (25 mg total) by mouth at bedtime.  60 tablet  1  . gabapentin (NEURONTIN) 600 MG tablet Take 1 tablet (600 mg total) by mouth 2 (two) times daily.  60 tablet  3  . meloxicam (MOBIC) 7.5 MG tablet Take 7.5 mg by mouth daily.        . PredniSONE (STERAPRED 12 DAY) 5 MG KIT Follow package instructions   1 kit  0  . traMADol (ULTRAM) 50 MG tablet Take 1 tablet (50 mg total) by mouth every 6 (six) hours as needed.  120 tablet  4   Allergies  Allergen Reactions  . Penicillins     REACTION: uncle highly allergic so was told whole family should not take it  . Nicotine Rash          Review of Systems  Constitutional: Negative for fever, chills, diaphoresis and fatigue.  Neurological: Negative for weakness and numbness.       Objective:   Physical Exam  Constitutional: She is oriented to person, place, and time. She appears well-developed and well-nourished.       BP 120/70   Pulmonary/Chest: Effort normal.  Musculoskeletal:       Low back with  intact skin. No swelling, no hematomas. FROM for flexion, extension, rotation and lateralization.  No Tenderness to palpation on SI joint area B/L. Faber test negative for SI joint pain. Straight leg raise negative B/L. Strength 5/5 for hip flexion and extension, 5/5 for knee flexion and extension, 5/5 for ankle plantar and dorsal flexion B/L. DTR patellar and achilles II/IV B/L Sensation intact distally B/L. No leg discrepancy.    Neurological: She is alert and oriented to person, place, and time. No cranial nerve deficit.  Skin: Skin is warm. No rash noted. No erythema.  Psychiatric: She has a normal mood and affect. Her behavior is normal. Thought content normal.          Assessment & Plan:   1. LOW BACK PAIN, CHRONIC   2. Radiculopathy, lumbar region   3. Lumbar herniated disc    She would like to have a surgical referral to see her options. We will refereed her to [facility] ortho residency for evaluation. Recommended to get a copy of her L spine MRI in a disc.

## 2011-07-03 ENCOUNTER — Encounter: Payer: Self-pay | Admitting: *Deleted

## 2011-07-03 NOTE — Progress Notes (Signed)
Patient ID: Melissa Walker, female   DOB: [birth year], 31 y.o.   MRN: [id number]  Faxed referral and office notes to orthopedic clinic at [facility] per Dr. Ashley Jacobs.

## 2011-07-22 ENCOUNTER — Other Ambulatory Visit: Payer: Self-pay | Admitting: Family Medicine

## 2011-07-23 ENCOUNTER — Encounter (HOSPITAL_COMMUNITY): Payer: Self-pay | Admitting: *Deleted

## 2011-07-23 ENCOUNTER — Emergency Department (INDEPENDENT_AMBULATORY_CARE_PROVIDER_SITE_OTHER)
Admission: EM | Admit: 2011-07-23 | Discharge: 2011-07-23 | Disposition: A | Discharge status: Home / Self Care | Payer: Self-pay | Source: Home / Self Care | Attending: Emergency Medicine | Admitting: Emergency Medicine

## 2011-07-23 DIAGNOSIS — J209 Acute bronchitis, unspecified: Secondary | ICD-10-CM

## 2011-07-23 MED ORDER — GUAIFENESIN-CODEINE 100-10 MG/5ML PO SYRP: 10.0000 mL | ORAL_SOLUTION | Freq: Four times a day (QID) | ORAL | Status: AC | PRN

## 2011-07-23 MED ORDER — ALBUTEROL SULFATE HFA 108 (90 BASE) MCG/ACT IN AERS: 1.0000 | INHALATION_SPRAY | Freq: Four times a day (QID) | RESPIRATORY_TRACT | Status: DC | PRN

## 2011-07-23 MED ORDER — AZITHROMYCIN 250 MG PO TABS: ORAL_TABLET | ORAL | Status: AC

## 2011-07-23 MED ORDER — PREDNISONE 5 MG PO KIT: 1.0000 | PACK | Freq: Every day | ORAL | Status: DC

## 2011-07-23 NOTE — Discharge Instructions (Signed)

## 2011-07-23 NOTE — ED Provider Notes (Signed)
Chief Complaint  Patient presents with  . Cough    History of Present Illness:   Melissa Walker is a 31 year old female with a three-week history of a nonproductive cough, wheezing, aching in her ribs, and a scratchy throat. She denies fever, chills, sweats, nasal congestion, rhinorrhea, or postnasal drip. She has no history of asthma, but she does smoke a pack of cigarettes a day. She denies any GI complaints.  Review of Systems:  Other than noted above, the patient denies any of the following symptoms. Systemic:  No fever, chills, sweats, fatigue, myalgias, headache, or anorexia. Eye:  No redness, pain or drainage. ENT:  No earache, ear congestion, nasal congestion, sneezing, rhinorrhea, sinus pressure, sinus pain, post nasal drip, or sore throat. Lungs:  No cough, sputum production, wheezing, shortness of breath, or chest pain. GI:  No abdominal pain, nausea, vomiting, or diarrhea. Skin:  No rash or itching.  PMFSH:  Past medical history, family history, social history, meds, and allergies were reviewed.  Physical Exam:   Vital signs:  BP 136/83  Pulse 112  Temp(Src) 98.3 F (36.8 C) (Oral)  Resp 16  SpO2 100%  LMP 07/05/2011 General:  Alert, in no distress. Eye:  No conjunctival injection or drainage. Lids were normal. ENT:  TMs and canals were normal, without erythema or inflammation.  Nasal mucosa was clear and uncongested, without drainage.  Mucous membranes were moist.  Pharynx was clear, without exudate or drainage.  There were no oral ulcerations or lesions. Neck:  Supple, no adenopathy, tenderness or mass. Lungs:  No respiratory distress.  Lungs were clear to auscultation, without wheezes, rales or rhonchi.  Breath sounds were clear and equal bilaterally. Lungs were resonant to percussion.  No egophony. Heart:  Regular rhythm, without gallops, murmers or rubs. Skin:  Clear, warm, and dry, without rash or lesions.  Assessment:  The encounter diagnosis was Acute bronchitis.  Plan:    1.  The following meds were prescribed:   New Prescriptions   ALBUTEROL (PROVENTIL HFA;VENTOLIN HFA) 108 (90 BASE) MCG/ACT INHALER    Inhale 1-2 puffs into the lungs every 6 (six) hours as needed for wheezing.   AZITHROMYCIN (ZITHROMAX Z-PAK) 250 MG TABLET    Take as directed.   GUAIFENESIN-CODEINE (GUIATUSS AC) 100-10 MG/5ML SYRUP    Take 10 mLs by mouth 4 (four) times daily as needed for cough.   PREDNISONE 5 MG KIT    Take 1 kit (5 mg total) by mouth daily after breakfast. Prednisone 5 mg 6 day dosepack.  Take as directed.   2.  The patient was instructed in symptomatic care and handouts were given. 3.  The patient was told to return if becoming worse in any way, if no better in 3 or 4 days, and given some red flag symptoms that would indicate earlier return. She was strongly encouraged to quit smoking.   Reuben Likes, MD 07/23/11 1254

## 2011-07-23 NOTE — ED Notes (Signed)
Pt  Reports  Symptoms  Of a  Non  Productive  Cough  Which  She  Reports  Has  Persisted for   3  Weeks  She  Reports  It  Started  Out as  A  Scratchy  Throat    -  She   Is  Speaking  In  Complete  sentances     In  No  Severe  Distress      She is  A  Smoker

## 2011-08-05 ENCOUNTER — Other Ambulatory Visit: Payer: Self-pay | Admitting: *Deleted

## 2011-08-05 DIAGNOSIS — M545 Low back pain: Secondary | ICD-10-CM

## 2011-08-05 DIAGNOSIS — M5116 Intervertebral disc disorders with radiculopathy, lumbar region: Secondary | ICD-10-CM

## 2011-08-05 MED ORDER — METHOCARBAMOL 500 MG PO TABS: 500.0000 mg | ORAL_TABLET | Freq: Three times a day (TID) | ORAL | Status: AC | PRN

## 2011-08-10 ENCOUNTER — Telehealth: Payer: Self-pay | Admitting: *Deleted

## 2011-08-10 NOTE — Telephone Encounter (Signed)
Spoke with pt- she states her hip has been swelling for a few days, not painful.  She did not have an injury.  States she is scheduled to see surgeon next Tuesday 6/18.  She is taking her tramadol and robaxin for pain, advised to add ibuprofen or aleve to help with inflammation if she does not have any contraindications to these medications. Also suggested that patient ice her hip twice daily.  Offered pt an appointment with Dr. Ashley Jacobs Thursday, she will call back for appt if swelling worsens or if pain develops, otherwise she wants to wait for appt with surgeon.

## 2011-08-19 ENCOUNTER — Other Ambulatory Visit (HOSPITAL_COMMUNITY): Payer: Self-pay | Admitting: Orthopaedic Surgery

## 2011-08-19 DIAGNOSIS — M5416 Radiculopathy, lumbar region: Secondary | ICD-10-CM

## 2011-08-19 DIAGNOSIS — IMO0002 Reserved for concepts with insufficient information to code with codable children: Secondary | ICD-10-CM

## 2011-08-20 ENCOUNTER — Other Ambulatory Visit (HOSPITAL_COMMUNITY): Payer: Self-pay

## 2011-08-21 ENCOUNTER — Ambulatory Visit (HOSPITAL_COMMUNITY)
Admission: RE | Admit: 2011-08-21 | Discharge: 2011-08-21 | Disposition: A | Discharge status: Home / Self Care | Payer: Self-pay | Source: Ambulatory Visit | Attending: Orthopaedic Surgery | Admitting: Orthopaedic Surgery

## 2011-08-21 DIAGNOSIS — K8020 Calculus of gallbladder without cholecystitis without obstruction: Secondary | ICD-10-CM | POA: Insufficient documentation

## 2011-08-21 DIAGNOSIS — M545 Low back pain, unspecified: Secondary | ICD-10-CM | POA: Insufficient documentation

## 2011-08-21 DIAGNOSIS — IMO0002 Reserved for concepts with insufficient information to code with codable children: Secondary | ICD-10-CM | POA: Insufficient documentation

## 2011-08-21 DIAGNOSIS — M79609 Pain in unspecified limb: Secondary | ICD-10-CM | POA: Insufficient documentation

## 2011-08-21 DIAGNOSIS — M5416 Radiculopathy, lumbar region: Secondary | ICD-10-CM

## 2011-11-14 DIAGNOSIS — M533 Sacrococcygeal disorders, not elsewhere classified: Secondary | ICD-10-CM

## 2011-11-14 DIAGNOSIS — G894 Chronic pain syndrome: Secondary | ICD-10-CM

## 2011-11-14 DIAGNOSIS — F5000 Anorexia nervosa, unspecified: Secondary | ICD-10-CM

## 2011-11-14 DIAGNOSIS — R64 Cachexia: Secondary | ICD-10-CM

## 2011-11-14 DIAGNOSIS — M461 Sacroiliitis, not elsewhere classified: Secondary | ICD-10-CM

## 2011-11-14 DIAGNOSIS — K130 Diseases of lips: Secondary | ICD-10-CM

## 2011-11-14 HISTORY — DX: Sacroiliitis, not elsewhere classified: M46.1

## 2011-11-14 HISTORY — DX: Cachexia: R64

## 2011-11-14 HISTORY — DX: Sacrococcygeal disorders, not elsewhere classified: M53.3

## 2011-11-14 HISTORY — DX: Chronic pain syndrome: G89.4

## 2011-11-14 HISTORY — DX: Diseases of lips: K13.0

## 2011-11-14 HISTORY — DX: Anorexia nervosa, unspecified: F50.00

## 2012-01-07 ENCOUNTER — Encounter: Payer: Self-pay | Admitting: *Deleted

## 2012-01-07 ENCOUNTER — Encounter: Payer: Self-pay | Attending: "Endocrinology | Admitting: *Deleted

## 2012-01-07 VITALS — Ht 63.0 in | Wt 84.0 lb

## 2012-01-07 DIAGNOSIS — R64 Cachexia: Secondary | ICD-10-CM | POA: Insufficient documentation

## 2012-01-07 DIAGNOSIS — Z713 Dietary counseling and surveillance: Secondary | ICD-10-CM | POA: Insufficient documentation

## 2012-01-07 NOTE — Progress Notes (Signed)
Medical Nutrition Therapy:  Appt start time: 1100 end time:  1200.  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 Lost 13 pounds from June to September.  Normal weight is around 97 pounds.  Khiley has always been underweight, possibly due to tobacco abuse.  However, she started methocarbamol about 6 months ago and a common side effect is anorexia.    MEDICATIONS: see list   DIETARY INTAKE:  Usual eating pattern includes 2 meals and 0-1 snacks per day.  Avoided foods include tough foods (has false teeth).    24-hr recall:  B ( AM): skips  Snk ( AM): skips  L ( PM): sandwich ([place] or ham sometimes has cheese with mayo, sometimes black olives and vinegar from subway). Sometimes chips Sometimes [employer] burger no fries.  May miss it or delay until later in the afternoon Snk ( PM): fries and hushpuppies D ( PM): chicken fried or broiled with vegetables Snk ( PM): brownies or little banana muffins Beverages: coke, dt dew, sweet tea, no water  Usual physical activity: stands at work  Estimated energy needs: 1500-1600 calories for slow weight gain  Progress Towards Goal(s):  In progress.   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  As evidenced by 13 pound weight loss in 4 months.    Intervention:  Nutrition counseling provided.  Deshanna reports poor appetite, since taking methocarbamol, but she is not willing to switch medications because this one works for her.  She would like to gain back the weight she's lost.  Her goal is to weigh 97-100 pounds.  I agree that is a good goal for her and recommended gradual weight gain of about 1 pound/week.  Suggested 3 meals a day.  She feels more comfortable with 5 small meals.  I agreed that would be fine, as long as she gets those 5 meals/snacks in.  She sleeps through most of the day and misses meals currently.  Told her she can try 5 eating occasions, but if she sleeps though her  snacks, we will need to reevaluate.  She's not hungry in the morning; suggested meal replacement shake like Boost, Ensure, or [employer] Essentials made with whole milk.  Recommended snack a couple hours later before she takes her nap.  Recommended snack when she wakes up from her nap, the to follow diet as reported (latae lunch, dinner, and night time snack).  She drinks copious amounts of soda and tea.  Encouraged reduction in sugary beverages and to add more water and milk as drinks throughout the day.  Morelia agreed to all recommendations.    Monitoring/Evaluation:  Dietary intake and body weight in 3 week(s).

## 2012-01-07 NOTE — Patient Instructions (Addendum)
Aim for 5 small eating occasions a day-  If you find that you're sleeping through your meals- may need to adults plan plan  When you wake up, try Carnation shake made with whole milk or Boost or Ensure  When you come back home before nap, eat small snack like yogurt or granola bar or fruit or crackers, etc  When you wake up from nap, eat another small snack: 1/2 sandwich or chips or crackers, etc  Eat chicken at work like normal  Continue brownies or banana bites as reports before bed  Alternate beverages: soda then water or milk, then soda, then water or milk

## 2012-01-13 ENCOUNTER — Inpatient Hospital Stay (HOSPITAL_COMMUNITY)
Admission: AD | Admit: 2012-01-13 | Discharge: 2012-01-13 | Disposition: A | Discharge status: Home / Self Care | Payer: Self-pay | Source: Ambulatory Visit | Attending: Obstetrics and Gynecology | Admitting: Obstetrics and Gynecology

## 2012-01-13 ENCOUNTER — Inpatient Hospital Stay (HOSPITAL_COMMUNITY): Payer: Self-pay

## 2012-01-13 ENCOUNTER — Encounter (HOSPITAL_COMMUNITY): Payer: Self-pay | Admitting: Family

## 2012-01-13 DIAGNOSIS — N939 Abnormal uterine and vaginal bleeding, unspecified: Secondary | ICD-10-CM

## 2012-01-13 DIAGNOSIS — R109 Unspecified abdominal pain: Secondary | ICD-10-CM | POA: Insufficient documentation

## 2012-01-13 DIAGNOSIS — R634 Abnormal weight loss: Secondary | ICD-10-CM | POA: Insufficient documentation

## 2012-01-13 DIAGNOSIS — N938 Other specified abnormal uterine and vaginal bleeding: Secondary | ICD-10-CM | POA: Insufficient documentation

## 2012-01-13 DIAGNOSIS — N949 Unspecified condition associated with female genital organs and menstrual cycle: Secondary | ICD-10-CM | POA: Insufficient documentation

## 2012-01-13 DIAGNOSIS — N898 Other specified noninflammatory disorders of vagina: Secondary | ICD-10-CM

## 2012-01-13 HISTORY — DX: Dorsalgia, unspecified: M54.9

## 2012-01-13 HISTORY — DX: Other chronic pain: G89.29

## 2012-01-13 LAB — CBC WITH DIFFERENTIAL/PLATELET
Basophils Absolute: 0 10*3/uL (ref 0.0–0.1)
HCT: 41.3 % (ref 36.0–46.0)
Hemoglobin: 14.4 g/dL (ref 12.0–15.0)
Lymphocytes Relative: 31 % (ref 12–46)
Monocytes Absolute: 0.5 10*3/uL (ref 0.1–1.0)
Monocytes Relative: 7 % (ref 3–12)
Neutro Abs: 3.8 10*3/uL (ref 1.7–7.7)
Neutrophils Relative %: 61 % (ref 43–77)
RDW: 13 % (ref 11.5–15.5)
WBC: 6.3 10*3/uL (ref 4.0–10.5)

## 2012-01-13 LAB — COMPREHENSIVE METABOLIC PANEL
AST: 12 U/L (ref 0–37)
Albumin: 3.9 g/dL (ref 3.5–5.2)
Alkaline Phosphatase: 54 U/L (ref 39–117)
CO2: 27 mEq/L (ref 19–32)
Chloride: 102 mEq/L (ref 96–112)
Creatinine, Ser: 0.79 mg/dL (ref 0.50–1.10)
GFR calc non Af Amer: >90 mL/min (ref >90)
Potassium: 4.8 mEq/L (ref 3.5–5.1)
Total Bilirubin: 0.6 mg/dL (ref 0.3–1.2)

## 2012-01-13 LAB — WET PREP, GENITAL

## 2012-01-13 MED ORDER — IBUPROFEN 600 MG PO TABS: 600.0000 mg | ORAL_TABLET | Freq: Four times a day (QID) | ORAL | Status: DC | PRN

## 2012-01-13 NOTE — MAU Note (Addendum)
Pt reports she has been bleeding since Saturday, with pelvic pain. Reports LMP began 10/27 and states she bled for 4 days, typical of her cycle. Pt reports she has lost 13 lbs since June and is seeing a nutritionist.

## 2012-01-13 NOTE — MAU Provider Note (Signed)
History     CSN: [id number]  Arrival date and time: 01/13/12 0836   None     Chief Complaint  Patient presents with  . Vaginal Bleeding   HPI Melissa Walker is a 31 y.o. female who presents to MAU with abdominal pain. The pain started 2 days ago. She describes the pain as throbbing  She rates the pain as 7/10. The pain is located in the lower abdomen. Associated symptoms include vaginal bleeding that started 4 days ago. Was not time for period. LMP 12/27/11 and was normal. Current sex partner x 8 years. Last pap smear 2 years ago and was abnormal but then repeated and was normal. Patient in the GYN Clinic. No history of STI's. The history was provided by the patient.  OB History    Grav Para Term Preterm Abortions TAB SAB Ect Mult Living   1 1 1       1       Past Medical History  Diagnosis Date  . Neuromuscular disorder   . Tobacco abuse   . Chronic back pain     Past Surgical History  Procedure Date  . Dental rehabilitation     full set dentures    Family History  Problem Relation Age of Onset  . Cancer Other   . Hypertension Other   . Heart attack Other     History  Substance Use Topics  . Smoking status: Current Every Day Smoker -- 0.5 packs/day    Types: Cigarettes  . Smokeless tobacco: Never Used  . Alcohol Use: Yes     Comment: occasional    Allergies:  Allergies  Allergen Reactions  . Penicillins     REACTION: uncle highly allergic so was told whole family should not take it  . Nicotine Rash         Prescriptions prior to admission  Medication Sig Dispense Refill  . methocarbamol (ROBAXIN) 500 MG tablet Take 500 mg by mouth 4 (four) times daily.      . traMADol (ULTRAM) 50 MG tablet Take 50 mg by mouth every 6 (six) hours as needed. pain        Review of Systems  Constitutional: Positive for weight loss (13 pounds over the past 5 months). Negative for fever and chills.  HENT: Negative for ear pain, nosebleeds, congestion, sore throat and  neck pain.   Eyes: Negative for blurred vision, double vision, photophobia and pain.  Respiratory: Negative for cough, shortness of breath and wheezing.   Cardiovascular: Negative for chest pain, palpitations and leg swelling.  Gastrointestinal: Positive for abdominal pain. Negative for heartburn, nausea, vomiting, diarrhea and constipation.  Genitourinary: Negative for dysuria, urgency and frequency.       Vaginal bleeding  Musculoskeletal: Positive for back pain (chronic). Negative for myalgias.  Skin: Positive for rash. Negative for itching.  Neurological: Negative for dizziness, sensory change, speech change, seizures, weakness and headaches.  Endo/Heme/Allergies: Does not bruise/bleed easily.  Psychiatric/Behavioral: Negative for depression and substance abuse. The patient is not nervous/anxious and does not have insomnia.    Physical Exam   Blood pressure 147/83, pulse 91, temperature 98.6 F (37 C), temperature source Oral, resp. rate 18, height 5\' 3"  (1.6 m), weight 38.828 kg (85 lb 9.6 oz), last menstrual period 12/27/2011.  Physical Exam  Nursing note and vitals reviewed. Constitutional: She is oriented to person, place, and time. No distress.       Thin, white female  HENT:  Head: Normocephalic and atraumatic.  Eyes: EOM are normal.  Neck: Neck supple.  Cardiovascular: Normal rate.   Respiratory: Effort normal.  GI: Soft. There is tenderness.       Tender lower abdomen, without guarding or rebound.   Genitourinary:       External genitalia without lesions. Small blood vaginal vault. Mild CMT and bilateral adnexal tenderness. Uterus without palpable enlargement.  Musculoskeletal: Normal range of motion.  Neurological: She is alert and oriented to person, place, and time.  Skin: Skin is warm and dry.  Psychiatric: She has a normal mood and affect. Her behavior is normal. Judgment and thought content normal.   Assessment: 31 y.o. female with pelvic pain   Weight loss     Abnormal vaginal bleeding  Plan:  Labs, ultrasound    Cultures for GC,Chlamydia, Wet prep  Results for orders placed during the hospital encounter of 01/13/12 (from the past 24 hour(s))  WET PREP, GENITAL     Status: Abnormal   Collection Time   01/13/12  9:35 AM      Component Value Range   Yeast Wet Prep HPF POC NONE SEEN  NONE SEEN   Trich, Wet Prep NONE SEEN  NONE SEEN   Clue Cells Wet Prep HPF POC FEW (*) NONE SEEN   WBC, Wet Prep HPF POC FEW (*) NONE SEEN  CBC WITH DIFFERENTIAL     Status: Normal   Collection Time   01/13/12 10:50 AM      Component Value Range   WBC 6.3  4.0 - 10.5 K/uL   RBC 4.29  3.87 - 5.11 MIL/uL   Hemoglobin 14.4  12.0 - 15.0 g/dL   HCT 16.1  09.6 - 04.5 %   MCV 96.3  78.0 - 100.0 fL   MCH 33.6  26.0 - 34.0 pg   MCHC 34.9  30.0 - 36.0 g/dL   RDW 40.9  81.1 - 91.4 %   Platelets 301  150 - 400 K/uL   Neutrophils Relative 61  43 - 77 %   Neutro Abs 3.8  1.7 - 7.7 K/uL   Lymphocytes Relative 31  12 - 46 %   Lymphs Abs 2.0  0.7 - 4.0 K/uL   Monocytes Relative 7  3 - 12 %   Monocytes Absolute 0.5  0.1 - 1.0 K/uL   Eosinophils Relative 1  0 - 5 %   Eosinophils Absolute 0.0  0.0 - 0.7 K/uL   Basophils Relative 0  0 - 1 %   Basophils Absolute 0.0  0.0 - 0.1 K/uL  COMPREHENSIVE METABOLIC PANEL     Status: Abnormal   Collection Time   01/13/12 10:50 AM      Component Value Range   Sodium 139  135 - 145 mEq/L   Potassium 4.8  3.5 - 5.1 mEq/L   Chloride 102  96 - 112 mEq/L   CO2 27  19 - 32 mEq/L   Glucose, Bld 97  70 - 99 mg/dL   BUN 4 (*) 6 - 23 mg/dL   Creatinine, Ser 7.82  0.50 - 1.10 mg/dL   Calcium 9.7  8.4 - 95.6 mg/dL   Total Protein 7.3  6.0 - 8.3 g/dL   Albumin 3.9  3.5 - 5.2 g/dL   AST 12  0 - 37 U/L   ALT 7  0 - 35 U/L   Alkaline Phosphatase 54  39 - 117 U/L   Total Bilirubin 0.6  0.3 - 1.2 mg/dL   GFR calc non Af  Amer >90  >90 mL/min   GFR calc Af Amer >90  >90 mL/min      US Transvaginal Non-ob  01/13/2012  *RADIOLOGY  REPORT*  Clinical Data: Pelvic pain.  Abnormal uterine bleeding.  13 pound weight loss.  LMP 12/27/2011.  TRANSABDOMINAL AND TRANSVAGINAL ULTRASOUND OF PELVIS  Technique:  Both transabdominal and transvaginal ultrasound examinations of the pelvis were performed.  Transabdominal technique was performed for global imaging of the pelvis including uterus, ovaries, adnexal regions, and pelvic cul-de-sac.  It was necessary to proceed with endovaginal exam following the transabdominal exam to visualize the endometrium and right ovary.  Comparison:  None.  Findings: Uterus:  7.5 x 4.3 x 5.1 cm.  Retroverted.  No fibroids or other uterine mass identified.  Endometrium: Double layer thickness measures 6 mm transvaginally. No focal lesion visualized.  Right ovary: 3.1 x 2.5 x 2.5 cm.  Normal appearance.  Left ovary: 2.3 x 1.4 x 1.3 cm.  Normal appearance.  Other Findings:  No free fluid  IMPRESSION: Normal study.  No evidence of pelvic mass or other significant abnormality.   Original Report Authenticated By: Myles Rosenthal, M.D.    US Pelvis Complete  01/13/2012  *RADIOLOGY REPORT*  Clinical Data: Pelvic pain.  Abnormal uterine bleeding.  13 pound weight loss.  LMP 12/27/2011.  TRANSABDOMINAL AND TRANSVAGINAL ULTRASOUND OF PELVIS  Technique:  Both transabdominal and transvaginal ultrasound examinations of the pelvis were performed.  Transabdominal technique was performed for global imaging of the pelvis including uterus, ovaries, adnexal regions, and pelvic cul-de-sac.  It was necessary to proceed with endovaginal exam following the transabdominal exam to visualize the endometrium and right ovary.  Comparison:  None.  Findings: Uterus:  7.5 x 4.3 x 5.1 cm.  Retroverted.  No fibroids or other uterine mass identified.  Endometrium: Double layer thickness measures 6 mm transvaginally. No focal lesion visualized.  Right ovary: 3.1 x 2.5 x 2.5 cm.  Normal appearance.  Left ovary: 2.3 x 1.4 x 1.3 cm.  Normal appearance.  Other  Findings:  No free fluid  IMPRESSION: Normal study.  No evidence of pelvic mass or other significant abnormality.   Original Report Authenticated By: Myles Rosenthal, M.D.     I have reviewed this patient's vital signs, nurses notes, appropriate labs and imaging. I have discussed the results with the patient and she voices understanding. I have sent a message to the GYN Clinic for follow up for this patient and probably endometrial biopsy.  MAU Course: I have discussed this case with Dr. Jolayne Panther  Procedures   NEESE,HOPE, RN, FNP, [facility] 01/13/2012, 11:33 AM

## 2012-01-14 LAB — GC/CHLAMYDIA PROBE AMP, GENITAL: Chlamydia, DNA Probe: NEGATIVE

## 2012-01-14 NOTE — MAU Provider Note (Signed)
Attestation of Attending Supervision of Advanced Practitioner (CNM/NP): Evaluation and management procedures were performed by the Advanced Practitioner under my supervision and collaboration.  I have reviewed the Advanced Practitioner's note and chart, and I agree with the management and plan.  Michelyn Scullin 01/14/2012 2:22 PM

## 2012-01-21 ENCOUNTER — Encounter: Payer: Self-pay | Admitting: Internal Medicine

## 2012-02-01 ENCOUNTER — Other Ambulatory Visit: Payer: Self-pay | Admitting: Obstetrics & Gynecology

## 2012-02-04 ENCOUNTER — Ambulatory Visit (INDEPENDENT_AMBULATORY_CARE_PROVIDER_SITE_OTHER): Payer: Self-pay | Admitting: Obstetrics & Gynecology

## 2012-02-04 ENCOUNTER — Encounter: Payer: Self-pay | Admitting: Obstetrics & Gynecology

## 2012-02-04 VITALS — BP 126/82 | HR 85 | Temp 98.1°F | Ht 63.0 in | Wt 85.0 lb

## 2012-02-04 DIAGNOSIS — N926 Irregular menstruation, unspecified: Secondary | ICD-10-CM

## 2012-02-04 DIAGNOSIS — N939 Abnormal uterine and vaginal bleeding, unspecified: Secondary | ICD-10-CM

## 2012-02-04 DIAGNOSIS — IMO0002 Reserved for concepts with insufficient information to code with codable children: Secondary | ICD-10-CM

## 2012-02-04 DIAGNOSIS — M5416 Radiculopathy, lumbar region: Secondary | ICD-10-CM

## 2012-02-04 HISTORY — DX: Abnormal uterine and vaginal bleeding, unspecified: N93.9

## 2012-02-04 NOTE — Progress Notes (Addendum)
  Subjective:    Patient ID: Melissa Walker, female    DOB: 11-Mar-1980, 31 y.o.   MRN: [id number]  HPI  31 year old F who presents for follow up for abdominal pain, vaginal bleeding and weight loss for which she was evaluated in the MAU on 01/13/12. At the time and uterine u/s was performed and within normal limits. Her Hgb was stable at 14.4. She was discharged with a rx for ibuprofen and instructed to follow up as an outpatient. Since leaving the MAU, her intermittent bleeding has resolved, and she has finished prescription of ibuprofen.   Menstrual History: LMP first week of November - lasted 5 days; intermittent bleeding for 4 days 11/12-11/5, described as heavier than normal period; stopped 2 days after coming to the MAU;   Sexually Active: yes, no new partner, no bleeding after intercourse, denies painful intercourse; boyfriend had vasectomy   Past Medical History:  - Has hx of abnormal Pap, but f/u was negative for cancer, last Pap 4/11 - [facility];   - Weight loss - started in June, no change in eating pattern, no hx of thyroid disease, seeing a nutritionist - Back Pain   Family Med History: - Grandmother with lung cancer; No hx of GYN cancers  Social - 0.5 ppd smoker for 15 years   Review of Systems Positive for bilateral inguinal pain  Negative for shortness of breath, chest pain, cough, hemoptysis     Objective:   Physical Exam BP 126/82  Pulse 85  Temp 98.1 F (36.7 C) (Oral)  Ht 5\' 3"  (1.6 m)  Wt 85 lb (38.556 kg)  BMI 15.06 kg/m2  LMP 02/04/2012 Gen: very thin CF, mildly ill appearing, but NAD, pleasant and coversant HEENT: NCAT, OP clear and moist, no lymphadenopathy, no thyromegaly or nodules CC: RRR, no murmurs Pulm: CTA-B Abd: very thin, soft, NDNT, NABS GU: EGBUS: no lesions Vagina: no blood in vault Cervix: no lesion; no mucopurulent d/c Uterus: small, mobile Adnexa: no masses; non tender  Skin: generalize pallor    Assessment & Plan:  31 year old  F with mid-cycle bleeding of undetermined origin.  TSH today   Carolyn L. Harraway-Smith, M.D., [facility]

## 2012-02-04 NOTE — Assessment & Plan Note (Signed)
No clear etiology. Very low risk for malignancy given normal labs and ultrasound. Possibly related to thyroid, so check TSH. Also, repeat Pap since last once was in 2011. Patient counseled on potential benefits of OCP but did not want to start them today. Will follow as needed if test results normal.

## 2012-02-04 NOTE — Patient Instructions (Signed)
We will check your thyroid function today, because that can cause abnormal bleeding. We will also check a Pap smear since it's been more than 2 years. I will let you know the results of these studies.   For treatment of your abdominal pain and bleeding, we can consider birth control pills since that can help with both.   If your studies are normal, then you will only follow up as needed.   Take Care,   Dr. Clinton Sawyer

## 2012-02-08 ENCOUNTER — Encounter: Payer: Self-pay | Admitting: Family Medicine

## 2012-02-10 ENCOUNTER — Encounter: Payer: Self-pay | Admitting: *Deleted

## 2012-02-10 ENCOUNTER — Encounter: Payer: No Typology Code available for payment source | Attending: "Endocrinology | Admitting: *Deleted

## 2012-02-10 VITALS — Wt 85.4 lb

## 2012-02-10 DIAGNOSIS — R64 Cachexia: Secondary | ICD-10-CM | POA: Insufficient documentation

## 2012-02-10 DIAGNOSIS — Z713 Dietary counseling and surveillance: Secondary | ICD-10-CM | POA: Insufficient documentation

## 2012-02-10 NOTE — Patient Instructions (Addendum)
Whole milk, cheese, peanut butter, nuts, ice cream, Boost, and carnation are good snacks when you don't feel like eating a whole big meal  Continue excellent progress towards weight restoration- aim for 4-5 eating occasions/day

## 2012-02-10 NOTE — Progress Notes (Signed)
  Medical Nutrition Therapy:  Appt start time: 1230 end time:  1300.  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  She was also evaluated for thyroid disease.  Her TSH is on the low end of normal, but has been decreasing over the years.  Suggested talking with endocrinologist to see if medications might be indicated.  MEDICATIONS: see list   DIETARY INTAKE:  Usual eating pattern includes 4-5 meals   Everyday foods include Boost, chips, sandwiches, soda, chickens, fries.  Avoided foods include none.    24-hr recall:  B ( AM): Boost  Snk ( AM): none  L ( PM): sandwich, soup Snk ( PM): fries or chips D ( PM): chicken, hushpuppies, something at [facility] Snk ( PM): chips or ice cream Beverages: soda and some water  Usual physical activity: none  Estimated energy needs:  1500-1600 calories for slow weight gain   Progress Towards Goal(s): Some progress.   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 As evidenced by 13 pound weight loss in 4 months.    Intervention:  Nutrition counseling provided.  [name] has gained almost 1.5 pounds this month!  She is very pleased with her progress, as am I.  She admits to attending several holiday parties and also her mother has been feeding her more meals.  Melissa Walker tries to eat as much as possible, whenever possible.  She drinks a Boost or [employer] Breakfast each morning, and tries to get in 3-4 more eating occasions throughout the day.  She has cut back slightly on her sugary beverages and does drink some more water.  She says she feels a little better.  Applauded her efforts and encouraged her to keep up the good work.  Suggested the Boost or CIB as a supplement when she doesn't feel like eating much.  Also reminded her about whole milk as a nutritious beverage.  Suggested peanut  butter, nuts, and cheese as calorie boosters.  [name] seems optimistic about future weight gains.   Monitoring/Evaluation:  Dietary intake and body weight prn.  [name] isn't sure about her insurance status in the new year.  Suggested follow up after January

## 2012-02-11 ENCOUNTER — Encounter: Payer: Self-pay | Admitting: Family Medicine

## 2012-02-18 ENCOUNTER — Ambulatory Visit: Payer: Self-pay | Admitting: Internal Medicine

## 2012-02-18 ENCOUNTER — Ambulatory Visit: Payer: Self-pay

## 2012-02-18 ENCOUNTER — Encounter: Payer: Self-pay | Admitting: Internal Medicine

## 2012-02-18 VITALS — BP 110/76 | HR 87 | Temp 97.0°F | Resp 16 | Ht 63.0 in | Wt 84.0 lb

## 2012-02-18 DIAGNOSIS — E041 Nontoxic single thyroid nodule: Secondary | ICD-10-CM

## 2012-02-18 DIAGNOSIS — R634 Abnormal weight loss: Secondary | ICD-10-CM | POA: Insufficient documentation

## 2012-02-18 NOTE — Progress Notes (Signed)
Subjective:     Patient ID: Melissa Walker, female   DOB: [birth year], 31 y.o.   MRN: [id number]  HPI  Melissa Walker is a pleasant 31 y/o woman referred by her nutritionist, Denny Levy, for evaluation for cachexia, with emphasis on possible thyroid ds.  Pt started to lose weight approx 6 months ago. At that time, she was sleeping most of the day, going to work from 3 pm to 9 pm (which she continues today), and having only one meal a day. She mentions that her baseline weight is 97-98 lbs, however, reviewing her weights from 2010, she was not far from today's value:     She has no energy and would like to sleep all day. She denies N/V no D, but has a BM every 2-7 days with the new diet, before, every 2-4 weeks (!). No melena or BRBPR unless she strains. No bloating. She did not have a colonoscopy or saw GI. Denies problems with swallowing. No HAs, except one when gets her menstrual cycle. She gets menstrual cycles every month. No hair falling, but has acne, getting worse on face and is new on the back. She is bruising easily. No new stretch marks. Denies anxiety/depression. Robaxin was started 1.5 years ago for back pain, and she mentions that she cannot do without it. She is to have a nerve block by Dr. Myna Hidalgo. She had several Prednisone 7-day tapers: 2 in the last year. She is now having injections in her spine, last injection was in 10/2011, had 3 in the last year.  She sleeps with an electric blanket on (to relax her back) and a fan on her head.   No anemia, no liver function tests abnormality. Her TSH labs were reviewed from 4 years ago to this month and they were always great, at 1 (last value was 1.052). No h/o cancer (has FH of lung cancer in grandmother, who was a smoker). She smokes but no SOB or cough. She is cutting down from 2 ppd to 0.5 ppd. She had a normal PAP smear.  Her diet is now; B'fast: boost Lunch: [place] sandwich Dinner: chicken with green beans or collards.  Snacks (3x a  day): potato chips, PB + crackers, also icecream and cake  She will see her new PCP, Dr. Burnadette Peter, on Jan 9th.  Review of Systems Constitutional: + weight gain/loss, + fatigue, no subjective hyperthermia/hypothermia Eyes: no blurry vision, no xerophthalmia ENT: no sore throat, no nodules palpated in throat, no dysphagia/odynophagia, no hoarseness Cardiovascular: no CP/SOB/leg swelling, but has palpitations - last few nights Respiratory: no cough/SOB Gastrointestinal: no N/V/D/C Musculoskeletal: + muscle/joint aches as per HPI Skin: acne and rash as per HPI Neurological: no tremors/numbness/tingling/dizziness Psychiatric: no depression/anxiety  Past Medical History  Diagnosis Date  . Neuromuscular disorder   . Tobacco abuse   . Chronic back pain     Past Surgical History  Procedure Date  . Dental rehabilitation     full set dentures   History   Social History  . Marital Status: Single    Spouse Name: N/A    Number of Children: 1, 48 y/o  . Years of Education: N/A   Occupational History  . International aid/development worker   Social History Main Topics  . Smoking status: Current Every Day Smoker -- 0.5 packs/day    Types: Cigarettes  . Smokeless tobacco: Never Used  . Alcohol Use: No     Comment: occasional  . Drug Use: No  . Sexually Active:  Yes   Current Outpatient Prescriptions on File Prior to Visit  Medication Sig Dispense Refill  . ibuprofen (ADVIL,MOTRIN) 600 MG tablet Take 1 tablet (600 mg total) by mouth every 6 (six) hours as needed for pain.  30 tablet  0  . methocarbamol (ROBAXIN) 500 MG tablet Take 500 mg by mouth 4 (four) times daily.      . traMADol (ULTRAM) 50 MG tablet Take 50 mg by mouth every 6 (six) hours as needed. pain       Allergies  Allergen Reactions  . Penicillins     REACTION: uncle highly allergic so was told whole family should not take it  . Nicotine Rash    Nicotine PATCH   Family History  Problem Relation Age of Onset  . Eclampsia  Maternal Grandmother   . Heart attack Maternal Grandmother   . Cancer Maternal Grandmother     lung  . Heart disease Maternal Grandmother   . Hyperlipidemia Maternal Grandmother   . Hypertension Maternal Grandmother    Objective:   Physical Exam BP 110/76  Pulse 87  Temp 97 F (36.1 C) (Oral)  Resp 16  Ht 5\' 3"  (1.6 m)  Wt 84 lb (38.102 kg)  BMI 14.88 kg/m2  SpO2 97%  LMP 02/04/2012 Wt Readings from Last 3 Encounters:  02/18/12 84 lb (38.102 kg)  02/10/12 85 lb 6.4 oz (38.737 kg)  02/04/12 85 lb (38.556 kg)  Constitutional: cachectic, in NAD, looking much older than stated age Eyes: PERRLA, EOMI, no exophthalmos ENT: moist mucous membranes, no thyromegaly, but a 1 cm thyroid nodule is palpated in left lobe - moves with swallowing, no cervical lymphadenopathy Cardiovascular: RRR, No MRG Respiratory: CTA B Gastrointestinal: abdomen soft, NT, ND, BS+ Musculoskeletal: no deformities, strength intact in all 4 Skin: moist, warm, she has few small reddish maculae on abdomen. No dermatitis herpetiforma. Neurological: no tremor with outstretched hands, DTR normal in all 4  Assessment:     1. Abnormal weight loss/cachexia - TSH normal - no anemia - LFTs normal - abnormal PAP smear, but f/u negative for cancer - abnormal mammogram (03/2010) with 0.5 cm nodule >> U/S subsequently normal, advised to return for a new mammogram at 39   2. Left thyroid nodule (new)    Plan:     1. Cachexia - pt describes a long period of time in which she slept most of the day and ate 1 meal a day, now she wakes up to eat and follows a meal plan put together by Denny Levy - she initially gained 1.5 lbs, now back to 84 lbs. - we discussed possible endocrine causes of abnormal weight loss  Her TSH is normal, so no sign of hyperthyroidism  We will check her cortisol level to see if she has adrenal insufficiency, but low pretest probability since no associated apx, no dark discoloration of skin, no  electrolyte abnormality  We will also check for celiac disease, but less likely since she does not have anemia - I explained that the fact that she has normal menstrual cycles stands for probably normal hormonal status, but she will definitely need to follow the diet plan. I advised her to add fibers with snacks (fruits, vegetables). - I would recommend to have a recheck of her mammogram sooner than 8 years from now, would repeat it next year - Also, a colonoscopy might be advised  - advised her to start taking MVI and Magnesium (will help with both palpitations and her constipation) -  Robaxin can contribute to her anorexia, but pt does not want to stop it, would continue with the diet she started - I will let her know the results of her tests  2. Thyroid nodule - I will get a thyroid U/S and then f/u on a yearly basis, if benign ultrasonographic features; or will get FNA otherwise  *RADIOLOGY REPORT*  Clinical Data: Thyroid nodule palpated in the left neck  THYROID ULTRASOUND  Technique: Ultrasound examination of the thyroid gland and adjacent soft tissues was performed.  Comparison: None.  Findings:  Right thyroid lobe: 4.1 x 0.8 x 1.0 cm. Left thyroid lobe: 4.8 x 1.5 x 1.6 cm. Isthmus: 2 mm in thickness.  Focal nodules: The right lobe of thyroid is homogeneous, although the left lobe is somewhat inhomogeneous. Nodules are noted primarily throughout the left lobe all of which appear hypoechoic consistent with cysts. The largest cystic structure in the left lobe is in the upper pole measuring 1.2 x 0.8 x 0.7 cm.  Lymphadenopathy: None visualized.  IMPRESSION: Small cysts are present throughout the left lobe of thyroid with the largest of 12 mm in the upper pole. No solid thyroid nodule is seen. Original Report Authenticated By: Dwyane Dee, M.D.  We will continue to follow the cysts clinically and maybe repeat an U/S in 2-3 years.  Other labs pending.

## 2012-02-18 NOTE — Patient Instructions (Addendum)
Please try to stick to your new diet. Incorporate fruits and vegetables, too. We will let you know the results of your labs.  Start taking a multivitamin pill a day and a magnesium pill a day (400 or 500 mg). We will call you with the appointment for your thyroid ultrasound.

## 2012-02-25 ENCOUNTER — Ambulatory Visit
Admission: RE | Admit: 2012-02-25 | Discharge: 2012-02-25 | Disposition: A | Discharge status: Home / Self Care | Payer: PRIVATE HEALTH INSURANCE | Source: Ambulatory Visit | Attending: Internal Medicine | Admitting: Internal Medicine

## 2012-02-25 DIAGNOSIS — E041 Nontoxic single thyroid nodule: Secondary | ICD-10-CM

## 2012-03-03 ENCOUNTER — Encounter: Payer: Self-pay | Admitting: Internal Medicine

## 2012-03-10 ENCOUNTER — Ambulatory Visit (INDEPENDENT_AMBULATORY_CARE_PROVIDER_SITE_OTHER): Payer: No Typology Code available for payment source | Admitting: Internal Medicine

## 2012-03-10 ENCOUNTER — Encounter: Payer: Self-pay | Admitting: Internal Medicine

## 2012-03-10 VITALS — BP 126/84 | HR 71 | Temp 97.3°F | Ht 63.0 in | Wt 83.0 lb

## 2012-03-10 DIAGNOSIS — M545 Low back pain: Secondary | ICD-10-CM

## 2012-03-10 DIAGNOSIS — S8010XA Contusion of unspecified lower leg, initial encounter: Secondary | ICD-10-CM | POA: Insufficient documentation

## 2012-03-10 DIAGNOSIS — G8929 Other chronic pain: Secondary | ICD-10-CM

## 2012-03-10 DIAGNOSIS — Z23 Encounter for immunization: Secondary | ICD-10-CM

## 2012-03-10 DIAGNOSIS — R634 Abnormal weight loss: Secondary | ICD-10-CM

## 2012-03-10 LAB — CBC
HCT: 39.4 % (ref 36.0–46.0)
Hemoglobin: 13.8 g/dL (ref 12.0–15.0)
MCH: 33.1 pg (ref 26.0–34.0)
MCHC: 35 g/dL (ref 30.0–36.0)
MCV: 94.5 fL (ref 78.0–100.0)
RBC: 4.17 MIL/uL (ref 3.87–5.11)

## 2012-03-10 MED ORDER — IBUPROFEN 600 MG PO TABS: 600.0000 mg | ORAL_TABLET | Freq: Four times a day (QID) | ORAL | Status: DC | PRN

## 2012-03-10 NOTE — Assessment & Plan Note (Signed)
Looking back at patient's weight it appears that she has always been a very small woman. Her weight has changed however in the past year, now she is down to 83 pounds. She is followed by a nutritionist and also endocrinology. At this time it is unclear what is causing her nutritional difficulties. She denies any diarrhea or abdominal pain. She has a good appetite and eats about 5 meals a day.  She did not get labs drawn after her recent endocrinology visit, as patient is afraid of needles. Therefore, we will obtain labs today.  -Cortisol level (realizing that this is not the ideal time to draw a cortisol level, however, the patient will likely not return a morning hour) -Tissue transglutaminase, reticulocytes, CBC, Gliadin antibodies -Return to clinic in 6 weeks

## 2012-03-10 NOTE — Progress Notes (Signed)
Internal Medicine Clinic Visit    HPI:  Melissa Walker is a 32 y.o. year old female with a history of chronic back pain, malnutrition, who presents to reestablish care in our clinic and with complaints of easy bruising.  Bruising on legs, has noticed this only in the past month. No trauma to the area. Never had this before. Has had abnormal vaginal bleeding, unclear of the cause. No rashes, no other bruising, no melena or hematochezia. No family history of easy bruising or blood cancers. Father may have had a disorder with easy nose bleeding, but no other symptoms.  No fever or chills, no recent illness.  Sees a nutritionist for her weight loss. Has tried eating 5 meals per day and is not gaining weight. She has a good appetite. No diarrhea, normal stool. No problems with eating pasta. She denies any abdominal pain. No melena or hematochezia. She has not had a colonoscopy or endoscopy before. She does state that her endocrinologist wanted to order some labs and that she did not return to get the labs drawn because she does not like needles.  Thyroid nodule recently found. Thyroid ultrasound done.   Past Medical History  Diagnosis Date  . Neuromuscular disorder   . Tobacco abuse   . Chronic back pain     Past Surgical History  Procedure Date  . Dental rehabilitation     full set dentures     ROS:  A complete review of systems was otherwise negative, except as noted in the HPI.  Allergies: Penicillins and Nicotine  Medications: Current Outpatient Prescriptions  Medication Sig Dispense Refill  . ibuprofen (ADVIL,MOTRIN) 600 MG tablet Take 1 tablet (600 mg total) by mouth every 6 (six) hours as needed for pain.  30 tablet  0  . methocarbamol (ROBAXIN) 500 MG tablet Take 500 mg by mouth 4 (four) times daily.      . traMADol (ULTRAM) 50 MG tablet Take 50 mg by mouth every 6 (six) hours as needed. pain        History   Social History  . Marital Status: Single    Spouse Name:  N/A    Number of Children: N/A  . Years of Education: N/A   Occupational History  . Not on file.   Social History Main Topics  . Smoking status: Current Every Day Smoker -- 0.5 packs/day    Types: Cigarettes  . Smokeless tobacco: Never Used  . Alcohol Use: No     Comment: occasional  . Drug Use: No  . Sexually Active: Yes   Other Topics Concern  . Not on file   Social History Narrative  . No narrative on file    family history includes Cancer in her maternal grandmother; Eclampsia in her maternal grandmother; Heart attack in her maternal grandmother; Heart disease in her maternal grandmother; Hyperlipidemia in her maternal grandmother; and Hypertension in her maternal grandmother.  Physical Exam Blood pressure 126/84, pulse 71, temperature 97.3 F (36.3 C), temperature source Oral, height 5\' 3"  (1.6 m), weight 83 lb (37.649 kg), SpO2 96.00%. General:  No acute distress, alert and oriented x 3, appears very thin HEENT:  PERRL, EOMI, moist mucous membranes, small thyroid nodule palpated Cardiovascular:  Regular rate and rhythm, no murmurs, rubs or gallops Respiratory:  Clear to auscultation bilaterally, no wheezes, rales, or rhonchi Abdomen:  Soft, nondistended, nontender, positive bowel sounds Extremities:  Warm and well-perfused, no clubbing, cyanosis, or edema. Left lower leg with 2 small healing bruises.  No other rashes, no petechia, no evidence of trauma. Skin is intact, no erythema. Skin: Warm, dry Neuro: Not anxious appearing, no depressed mood, normal affect  Labs: Lab Results  Component Value Date   CREATININE 0.79 01/13/2012   BUN 4* 01/13/2012   NA 139 01/13/2012   K 4.8 01/13/2012   CL 102 01/13/2012   CO2 27 01/13/2012   Lab Results  Component Value Date   WBC 6.3 01/13/2012   HGB 14.4 01/13/2012   HCT 41.3 01/13/2012   MCV 96.3 01/13/2012   PLT 301 01/13/2012      Assessment and Plan:   FOLLOWUP: JESIAH YERBY will follow back up in our  clinic in approximately  1-2 months. SADIA BELFIORE knows to call out clinic in the meantime with any questions or new issues.

## 2012-03-10 NOTE — Assessment & Plan Note (Signed)
Patient has chronic low back pain. We did not go into detail about this problem at the current visit however, we will address this at a later time. She does ask for refills on ibuprofen. She uses this occasionally for back pain or menstrual cramps and minor aches or pains. She states that she prefers a prescription rather than over-the-counter.

## 2012-03-10 NOTE — Patient Instructions (Signed)
Please return to clinic in 2 months for follow up.

## 2012-03-10 NOTE — Addendum Note (Signed)
Addended by: Larey Seat on: 03/10/2012 12:00 PM   Modules accepted: Orders

## 2012-03-10 NOTE — Assessment & Plan Note (Signed)
Patient reports one month of occasional small bruises on her legs. She does not remember any trauma to the areas. Currently, she has 2 small bruises on the medial side of her left lower leg. These are quite small and without underlying hematoma. She does not have any petechiae. She does not have any bleeding from other sources. I suspect this is just from unnoticed trauma. We discussed going to the ED if she develops a petechial rash or extensive bruising or bleeding.  -Check CBC

## 2012-03-11 LAB — GLIA (IGA/G) + TTG IGA
Gliadin IgG: 6.3 U/mL (ref <20)
Tissue Transglutaminase Ab, IgA: 5.4 U/mL (ref <20)

## 2012-03-15 DIAGNOSIS — Z79899 Other long term (current) drug therapy: Secondary | ICD-10-CM

## 2012-03-15 DIAGNOSIS — M47816 Spondylosis without myelopathy or radiculopathy, lumbar region: Secondary | ICD-10-CM

## 2012-03-15 HISTORY — DX: Other long term (current) drug therapy: Z79.899

## 2012-03-15 HISTORY — DX: Spondylosis without myelopathy or radiculopathy, lumbar region: M47.816

## 2012-03-26 DIAGNOSIS — M5137 Other intervertebral disc degeneration, lumbosacral region: Secondary | ICD-10-CM

## 2012-03-26 DIAGNOSIS — M545 Low back pain, unspecified: Secondary | ICD-10-CM

## 2012-03-26 DIAGNOSIS — M51379 Other intervertebral disc degeneration, lumbosacral region without mention of lumbar back pain or lower extremity pain: Secondary | ICD-10-CM

## 2012-03-26 HISTORY — DX: Low back pain, unspecified: M54.50

## 2012-03-26 HISTORY — DX: Other intervertebral disc degeneration, lumbosacral region without mention of lumbar back pain or lower extremity pain: M51.379

## 2012-05-03 ENCOUNTER — Ambulatory Visit (INDEPENDENT_AMBULATORY_CARE_PROVIDER_SITE_OTHER): Payer: No Typology Code available for payment source | Admitting: Internal Medicine

## 2012-05-03 ENCOUNTER — Encounter: Payer: Self-pay | Admitting: Internal Medicine

## 2012-05-03 VITALS — BP 128/79 | HR 84 | Temp 97.2°F | Ht 63.0 in | Wt 84.8 lb

## 2012-05-03 DIAGNOSIS — R634 Abnormal weight loss: Secondary | ICD-10-CM

## 2012-05-03 DIAGNOSIS — M545 Low back pain, unspecified: Secondary | ICD-10-CM

## 2012-05-03 DIAGNOSIS — D249 Benign neoplasm of unspecified breast: Secondary | ICD-10-CM

## 2012-05-03 MED ORDER — METHOCARBAMOL 500 MG PO TABS: 500.0000 mg | ORAL_TABLET | Freq: Four times a day (QID) | ORAL | Status: DC

## 2012-05-03 NOTE — Assessment & Plan Note (Signed)
Patient has previously imaged tender nodules in both left and right breasts, noticed when she is on her menses. No overlying skin deformities, erythema, inflammatory changes, nipple discharge, axillary lymphadenopathy. 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  -patient will return to clinic when not on her menses for reevaluation

## 2012-05-03 NOTE — Assessment & Plan Note (Signed)
Labs show at the last visit were normal. Normal celiac screen, normal CBC and reticulocytes. Patient continues to have low weight. I did explore the patient whether she is exercising excessively or using laxatives or trying to lose weight intentionally. She denied all of these things and states that she would actually like to gain weight. She continues to deny any diarrhea or vomiting and her labs are consistent with this. She does have a decreased appetite and eats 2-3 meals per day. She does not consistently use nutritional supplements like boost.  -Continue to monitor -Encouraged use of nutritional supplements up to 3 times a day

## 2012-05-03 NOTE — Progress Notes (Signed)
Internal Medicine Clinic Visit    HPI:  Melissa Walker is a 32 y.o. year old female with a history of chronic back pain, malnutrition, who presents to clinic for follow up of weight loss and has a new problem of breast tenderness.  Patient states that she has noticed painful areas in her breasts that were first noticed on Sunday. She is on her period currently and has not noticed this before she started on her period. She has had nodules in her breasts that were previously evaluated by mammography as well as ultrasound in January 2012, these were the same location. 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 Patient denies any nipple discharge, further weight loss, erythema or skin changes.  Patient states that she lost her job a month ago and has had trouble affording her medications. She requests that we send a prescription for her methocarbamol to the health department for her chronic back pain. She is followed by the pain clinic in [place].  Lower extremity bruising has been better, hasn't noticed it recently. On period now, no excessive bleeding noted.  No diarrhea, constipation. She does have decreased appetite, tries to eat three meals per day. Has about one ensure per day, was previously taking more. No recent changes in her medications.    Past Medical History  Diagnosis Date  . Neuromuscular disorder   . Tobacco abuse   . Chronic back pain   . Abnormal uterine bleeding 02/04/2012    Occurred 01/12/12-01/15/12. Evaluated in [facility] and all labs, exam, and pelvic u/s WNL. Given rx for ibuprofen and told to follow up as otupatient.      Past Surgical History  Procedure Laterality Date  . Dental rehabilitation      full set dentures     ROS:  A complete review of systems was otherwise negative, except as noted in the HPI.  Allergies: Penicillins and Nicotine  Medications: Current Outpatient Prescriptions  Medication Sig Dispense Refill  . ibuprofen  (ADVIL,MOTRIN) 600 MG tablet Take 1 tablet (600 mg total) by mouth every 6 (six) hours as needed for pain.  120 tablet  0  . methocarbamol (ROBAXIN) 500 MG tablet Take 1 tablet (500 mg total) by mouth 4 (four) times daily.  120 tablet  5   No current facility-administered medications for this visit.    History   Social History  . Marital Status: Single    Spouse Name: N/A    Number of Children: N/A  . Years of Education: N/A   Occupational History  . Not on file.   Social History Main Topics  . Smoking status: Current Every Day Smoker -- 0.50 packs/day    Types: Cigarettes  . Smokeless tobacco: Never Used     Comment: TRYING TO QUIT  . Alcohol Use: No     Comment: occasional  . Drug Use: No  . Sexually Active: Yes   Other Topics Concern  . Not on file   Social History Narrative  . No narrative on file    family history includes Cancer in her maternal grandmother; Eclampsia in her maternal grandmother; Heart attack in her maternal grandmother; Heart disease in her maternal grandmother; Hyperlipidemia in her maternal grandmother; and Hypertension in her maternal grandmother.  Physical Exam Blood pressure 128/79, pulse 84, temperature 97.2 F (36.2 C), temperature source Oral, height 5\' 3"  (1.6 m), weight 84 lb 12.8 oz (38.465 kg), last menstrual period 04/27/2012, SpO2 99.00%. General:  No acute distress, alert and oriented  x 3, appears very thin HEENT:  PERRL, EOMI, moist mucous membranes oropharynx clear Cardiovascular:  Regular rate and rhythm, no murmurs, rubs or gallops Breast: tender area in 12 oclock position of right breast, no discrete borders. Another tender area in 1 oclock position in left breast which had more discrete borders, 1cmx1cm. No overlying skin erythema, no nipple discharge, appearance breath while sitting are symmetrical, no peau d'orange, no inflammatory changes. Respiratory:  Clear to auscultation bilaterally, no wheezes, rales, or rhonchi Abdomen:   Soft, nondistended, nontender, positive bowel sounds Extremities:  Warm and well-perfused, no clubbing, cyanosis, or edema. No rashes, no petechia, no evidence of trauma. Skin is intact, no erythema. Skin: Warm, dry Neuro: Not anxious appearing, no depressed mood, normal affect  Labs: Lab Results  Component Value Date   CREATININE 0.79 01/13/2012   BUN 4* 01/13/2012   NA 139 01/13/2012   K 4.8 01/13/2012   CL 102 01/13/2012   CO2 27 01/13/2012   Lab Results  Component Value Date   WBC 6.0 03/10/2012   HGB 13.8 03/10/2012   HCT 39.4 03/10/2012   MCV 94.5 03/10/2012   PLT 296 03/10/2012      Assessment and Plan:   FOLLOWUP: RYLEI MASELLA will follow back up in our clinic in approximately 1-2 months. IVY PURYEAR knows to call out clinic in the meantime with any questions or new issues.   Case discussed with Dr. Criselda Peaches.

## 2012-05-03 NOTE — Patient Instructions (Addendum)
Please call the clinic in 1 week and let us know if the breast masses are still there or have changed in size and we will reevaluate and obtain imaging. Otherwise, return to clinic in 4-6 months, sooner if needed.

## 2012-05-03 NOTE — Assessment & Plan Note (Signed)
Patient has chronic low back pain that is followed by her pain doctor in [place], Dr. Romeo Rabon. She is currently taking Robaxin 500 mg 4 times a day #120. As patient recently lost her job, she is unable to afford this medication and request that we refill this prescription and sent to the health department where she can get it at a discounted price.  -Prescription called in to the health department

## 2012-05-20 ENCOUNTER — Encounter (HOSPITAL_COMMUNITY): Payer: Self-pay | Admitting: *Deleted

## 2012-05-20 ENCOUNTER — Inpatient Hospital Stay (HOSPITAL_COMMUNITY)
Admission: AD | Admit: 2012-05-20 | Discharge: 2012-05-20 | Disposition: A | Discharge status: Home / Self Care | Payer: No Typology Code available for payment source | Source: Ambulatory Visit | Attending: Obstetrics & Gynecology | Admitting: Obstetrics & Gynecology

## 2012-05-20 DIAGNOSIS — N938 Other specified abnormal uterine and vaginal bleeding: Secondary | ICD-10-CM | POA: Insufficient documentation

## 2012-05-20 DIAGNOSIS — B9689 Other specified bacterial agents as the cause of diseases classified elsewhere: Secondary | ICD-10-CM

## 2012-05-20 DIAGNOSIS — M545 Low back pain, unspecified: Secondary | ICD-10-CM | POA: Insufficient documentation

## 2012-05-20 DIAGNOSIS — N760 Acute vaginitis: Secondary | ICD-10-CM | POA: Insufficient documentation

## 2012-05-20 DIAGNOSIS — N923 Ovulation bleeding: Secondary | ICD-10-CM

## 2012-05-20 DIAGNOSIS — N949 Unspecified condition associated with female genital organs and menstrual cycle: Secondary | ICD-10-CM | POA: Insufficient documentation

## 2012-05-20 DIAGNOSIS — A499 Bacterial infection, unspecified: Secondary | ICD-10-CM

## 2012-05-20 DIAGNOSIS — R109 Unspecified abdominal pain: Secondary | ICD-10-CM | POA: Insufficient documentation

## 2012-05-20 LAB — URINALYSIS, ROUTINE W REFLEX MICROSCOPIC
Bilirubin Urine: NEGATIVE
Nitrite: NEGATIVE
Specific Gravity, Urine: 1.01 (ref 1.005–1.030)
pH: 8.5 — ABNORMAL HIGH (ref 5.0–8.0)

## 2012-05-20 LAB — CBC
HCT: 41.1 % (ref 36.0–46.0)
MCV: 95.1 fL (ref 78.0–100.0)
Platelets: 258 10*3/uL (ref 150–400)
RBC: 4.32 MIL/uL (ref 3.87–5.11)
WBC: 7.5 10*3/uL (ref 4.0–10.5)

## 2012-05-20 LAB — WET PREP, GENITAL
Trich, Wet Prep: NONE SEEN
Yeast Wet Prep HPF POC: NONE SEEN

## 2012-05-20 LAB — URINE MICROSCOPIC-ADD ON

## 2012-05-20 MED ORDER — METRONIDAZOLE 500 MG PO TABS: 500.0000 mg | ORAL_TABLET | Freq: Two times a day (BID) | ORAL | Status: DC

## 2012-05-20 NOTE — MAU Note (Signed)
Patient states she started her period on 2-26 and has had bleeding every day since that time. Thought bleeding had stopped today then started again with almost small clot. Patient has chronic pain and it is hard to tell if pain is different. Slight abdominal discomfort.

## 2012-05-20 NOTE — MAU Provider Note (Signed)
History     CSN: [id number]  Arrival date and time: 05/20/12 1122   First Provider Initiated Contact with Patient 05/20/12 1309      Chief Complaint  Patient presents with  . Vaginal Bleeding   HPI Ms. Carnegie is a 32 y/o female G1P1001 w/ hx of dysfunctional uterine bleeding who presents to MAU w/ complaint of continuous vaginal bleeding since she began her LMP on 04/27/12. She notes bleeding every day since, with variations in severity of bleeding. Bleeding has not been heavy. She has passed some small clots. She normally has a regular cycle w/ menstrual periods lasting 4-5 days, and denies prior episodes of persistent vaginal bleeding. She reports some lower abdominal pain and cramping, but has difficulty differentiating that pain from her chronic low back pain. She notes some vaginal odor, but denies any other notable discharge. No dysuria, hematuria, or flank pain. She is sexually active w/ one female partner, denies current concern for STI.   OB History   Grav Para Term Preterm Abortions TAB SAB Ect Mult Living   1 1 1       1       Past Medical History  Diagnosis Date  . Neuromuscular disorder   . Tobacco abuse   . Chronic back pain   . Abnormal uterine bleeding 02/04/2012    Occurred 01/12/12-01/15/12. Evaluated in [facility]U and all labs, exam, and pelvic u/s WNL. Given rx for ibuprofen and told to follow up as otupatient.      Past Surgical History  Procedure Laterality Date  . Dental rehabilitation      full set dentures    Family History  Problem Relation Age of Onset  . Eclampsia Maternal Grandmother   . Heart attack Maternal Grandmother   . Cancer Maternal Grandmother     lung  . Heart disease Maternal Grandmother   . Hyperlipidemia Maternal Grandmother   . Hypertension Maternal Grandmother     History  Substance Use Topics  . Smoking status: Current Every Day Smoker -- 0.50 packs/day    Types: Cigarettes  . Smokeless tobacco: Never Used     Comment: TRYING TO  QUIT  . Alcohol Use: No     Comment: occasional    Allergies:  Allergies  Allergen Reactions  . Penicillins     REACTION: uncle highly allergic so was told whole family should not take it  . Nicotine Rash    Nicotine PATCH    Prescriptions prior to admission  Medication Sig Dispense Refill  . methocarbamol (ROBAXIN) 500 MG tablet Take 1 tablet (500 mg total) by mouth 4 (four) times daily.  120 tablet  5    Review of Systems  Constitutional: Positive for malaise/fatigue (intermittent fatigue). Negative for fever and chills.  Eyes: Negative for blurred vision and double vision.  Respiratory: Negative for cough, shortness of breath and wheezing.   Cardiovascular: Negative for chest pain, palpitations and leg swelling.  Gastrointestinal: Negative for nausea, vomiting, diarrhea and constipation.  Genitourinary: Negative for dysuria, hematuria and flank pain.  Musculoskeletal: Positive for back pain (chronic back pain).  Neurological: Negative for dizziness, tingling, sensory change, loss of consciousness and headaches.  Endo/Heme/Allergies: Does not bruise/bleed easily.   Physical Exam   Blood pressure 136/81, pulse 81, temperature 97.7 F (36.5 C), resp. rate 16, height 5\' 1"  (1.549 m), weight 36.923 kg (81 lb 6.4 oz), last menstrual period 04/27/2012, SpO2 100.00%.  Physical Exam  Nursing note and vitals reviewed. Constitutional: She is oriented to  person, place, and time. She appears well-developed. She appears cachectic.  HENT:  Head: Normocephalic and atraumatic.  Cardiovascular: Normal rate, regular rhythm, normal heart sounds and intact distal pulses.  Exam reveals no gallop and no friction rub.   No murmur heard. Respiratory: Effort normal and breath sounds normal. No respiratory distress. She has no wheezes.  GI: Soft. Bowel sounds are normal. She exhibits no distension and no mass. There is no tenderness. There is no rebound, no guarding and no CVA tenderness.   Genitourinary: Cervix exhibits no motion tenderness and no friability. Right adnexum displays tenderness (mild). Right adnexum displays no mass. Left adnexum displays no mass and no tenderness. There is bleeding (mild) around the vagina. No tenderness around the vagina. No vaginal discharge found.  Musculoskeletal: She exhibits no edema.  Neurological: She is alert and oriented to person, place, and time.  Skin: Skin is warm and dry.   Results for orders placed during the hospital encounter of 05/20/12 (from the past 24 hour(s))  URINALYSIS, ROUTINE W REFLEX MICROSCOPIC     Status: Abnormal   Collection Time    05/20/12 12:10 PM      Result Value Range   Color, Urine RED (*) YELLOW   APPearance HAZY (*) CLEAR   Specific Gravity, Urine 1.010  1.005 - 1.030   pH 8.5 (*) 5.0 - 8.0   Glucose, UA NEGATIVE  NEGATIVE mg/dL   Hgb urine dipstick LARGE (*) NEGATIVE   Bilirubin Urine NEGATIVE  NEGATIVE   Ketones, ur NEGATIVE  NEGATIVE mg/dL   Protein, ur 30 (*) NEGATIVE mg/dL   Urobilinogen, UA 0.2  0.0 - 1.0 mg/dL   Nitrite NEGATIVE  NEGATIVE   Leukocytes, UA NEGATIVE  NEGATIVE  URINE MICROSCOPIC-ADD ON     Status: Abnormal   Collection Time    05/20/12 12:10 PM      Result Value Range   Squamous Epithelial / LPF MANY (*) RARE   WBC, UA 3-6  <3 WBC/hpf   RBC / HPF 11-20  <3 RBC/hpf   Bacteria, UA MANY (*) RARE  POCT PREGNANCY, URINE     Status: None   Collection Time    05/20/12 12:15 PM      Result Value Range   Preg Test, Ur NEGATIVE  NEGATIVE  CBC     Status: None   Collection Time    05/20/12 12:26 PM      Result Value Range   WBC 7.5  4.0 - 10.5 K/uL   RBC 4.32  3.87 - 5.11 MIL/uL   Hemoglobin 14.7  12.0 - 15.0 g/dL   HCT 32.4  40.1 - 02.7 %   MCV 95.1  78.0 - 100.0 fL   MCH 34.0  26.0 - 34.0 pg   MCHC 35.8  30.0 - 36.0 g/dL   RDW 25.3  66.4 - 40.3 %   Platelets 258  150 - 400 K/uL  WET PREP, GENITAL     Status: Abnormal   Collection Time    05/20/12  2:10 PM       Result Value Range   Yeast Wet Prep HPF POC NONE SEEN  NONE SEEN   Trich, Wet Prep NONE SEEN  NONE SEEN   Clue Cells Wet Prep HPF POC FEW (*) NONE SEEN   WBC, Wet Prep HPF POC FEW (*) NONE SEEN   MAU Course  Procedures None  Assessment and Plan   Visit Diagnoses   BV (bacterial vaginosis)    -  Primary    Relevant Medications       metroNIDAZOLE (FLAGYL) tablet    Spotting between menses                   - Follow-up with GYN clinic, will need repeat PAP, [place] results and possible further work-up or irregular bleeding             - U/S scheduled for 05/26/12     Dorrene German 05/20/2012, 1:19 PM   I have seen and evaluated this patient with the PA student. I agree with the assessment and plan as written above.   Freddi Starr, PA-C 05/20/2012 6:35 PM

## 2012-05-26 ENCOUNTER — Other Ambulatory Visit (HOSPITAL_COMMUNITY): Payer: Self-pay | Admitting: Medical

## 2012-05-26 ENCOUNTER — Ambulatory Visit (HOSPITAL_COMMUNITY)
Admit: 2012-05-26 | Discharge: 2012-05-26 | Disposition: A | Discharge status: Home / Self Care | Payer: No Typology Code available for payment source | Attending: Medical | Admitting: Medical

## 2012-05-26 DIAGNOSIS — N854 Malposition of uterus: Secondary | ICD-10-CM | POA: Insufficient documentation

## 2012-05-26 DIAGNOSIS — N938 Other specified abnormal uterine and vaginal bleeding: Secondary | ICD-10-CM | POA: Insufficient documentation

## 2012-05-26 DIAGNOSIS — N949 Unspecified condition associated with female genital organs and menstrual cycle: Secondary | ICD-10-CM | POA: Insufficient documentation

## 2012-05-26 DIAGNOSIS — N83209 Unspecified ovarian cyst, unspecified side: Secondary | ICD-10-CM | POA: Insufficient documentation

## 2012-06-15 ENCOUNTER — Encounter: Payer: Self-pay | Admitting: Obstetrics & Gynecology

## 2012-06-15 ENCOUNTER — Ambulatory Visit (INDEPENDENT_AMBULATORY_CARE_PROVIDER_SITE_OTHER): Payer: No Typology Code available for payment source | Admitting: Obstetrics & Gynecology

## 2012-06-15 VITALS — BP 120/77 | HR 84 | Temp 97.2°F | Ht 63.0 in | Wt 82.6 lb

## 2012-06-15 DIAGNOSIS — N926 Irregular menstruation, unspecified: Secondary | ICD-10-CM

## 2012-06-15 DIAGNOSIS — N939 Abnormal uterine and vaginal bleeding, unspecified: Secondary | ICD-10-CM

## 2012-06-15 NOTE — Progress Notes (Signed)
Came into MAU on 3/21 for menstrual period lasting for 3 weeks. Was dx with BV and given antibiotic- bleeding stopped 3 days later. Reports normal menstrual period last week.

## 2012-06-15 NOTE — Patient Instructions (Signed)
Smoking Cessation Quitting smoking is important to your health and has many advantages. However, it is not always easy to quit since nicotine is a very addictive drug. Often times, people try 3 times or more before being able to quit. This document explains the best ways for you to prepare to quit smoking. Quitting takes hard work and a lot of effort, but you can do it. ADVANTAGES OF QUITTING SMOKING  You will live longer, feel better, and live better.  Your body will feel the impact of quitting smoking almost immediately.  Within 20 minutes, blood pressure decreases. Your pulse returns to its normal level.  After 8 hours, carbon monoxide levels in the blood return to normal. Your oxygen level increases.  After 24 hours, the chance of having a heart attack starts to decrease. Your breath, hair, and body stop smelling like smoke.  After 48 hours, damaged nerve endings begin to recover. Your sense of taste and smell improve.  After 72 hours, the body is virtually free of nicotine. Your bronchial tubes relax and breathing becomes easier.  After 2 to 12 weeks, lungs can hold more air. Exercise becomes easier and circulation improves.  The risk of having a heart attack, stroke, cancer, or lung disease is greatly reduced.  After 1 year, the risk of coronary heart disease is cut in half.  After 5 years, the risk of stroke falls to the same as a nonsmoker.  After 10 years, the risk of lung cancer is cut in half and the risk of other cancers decreases significantly.  After 15 years, the risk of coronary heart disease drops, usually to the level of a nonsmoker.  If you are pregnant, quitting smoking will improve your chances of having a healthy baby.  The people you live with, especially any children, will be healthier.  You will have extra money to spend on things other than cigarettes. QUESTIONS TO THINK ABOUT BEFORE ATTEMPTING TO QUIT You may want to talk about your answers with your  caregiver.  Why do you want to quit?  If you tried to quit in the past, what helped and what did not?  What will be the most difficult situations for you after you quit? How will you plan to handle them?  Who can help you through the tough times? Your family? Friends? A caregiver?  What pleasures do you get from smoking? What ways can you still get pleasure if you quit? Here are some questions to ask your caregiver:  How can you help me to be successful at quitting?  What medicine do you think would be best for me and how should I take it?  What should I do if I need more help?  What is smoking withdrawal like? How can I get information on withdrawal? GET READY  Set a quit date.  Change your environment by getting rid of all cigarettes, ashtrays, matches, and lighters in your home, car, or work. Do not let people smoke in your home.  Review your past attempts to quit. Think about what worked and what did not. GET SUPPORT AND ENCOURAGEMENT You have a better chance of being successful if you have help. You can get support in many ways.  Tell your family, friends, and co-workers that you are going to quit and need their support. Ask them not to smoke around you.  Get individual, group, or telephone counseling and support. Programs are available at local hospitals and health centers. Call your local health department for   information about programs in your area.  Spiritual beliefs and practices may help some smokers quit.  Download a "quit meter" on your computer to keep track of quit statistics, such as how long you have gone without smoking, cigarettes not smoked, and money saved.  Get a self-help book about quitting smoking and staying off of tobacco. LEARN NEW SKILLS AND BEHAVIORS  Distract yourself from urges to smoke. Talk to someone, go for a walk, or occupy your time with a task.  Change your normal routine. Take a different route to work. Drink tea instead of coffee.  Eat breakfast in a different place.  Reduce your stress. Take a hot bath, exercise, or read a book.  Plan something enjoyable to do every day. Reward yourself for not smoking.  Explore interactive web-based programs that specialize in helping you quit. GET MEDICINE AND USE IT CORRECTLY Medicines can help you stop smoking and decrease the urge to smoke. Combining medicine with the above behavioral methods and support can greatly increase your chances of successfully quitting smoking.  Nicotine replacement therapy helps deliver nicotine to your body without the negative effects and risks of smoking. Nicotine replacement therapy includes nicotine gum, lozenges, inhalers, nasal sprays, and skin patches. Some may be available over-the-counter and others require a prescription.  Antidepressant medicine helps people abstain from smoking, but how this works is unknown. This medicine is available by prescription.  Nicotinic receptor partial agonist medicine simulates the effect of nicotine in your brain. This medicine is available by prescription. Ask your caregiver for advice about which medicines to use and how to use them based on your health history. Your caregiver will tell you what side effects to look out for if you choose to be on a medicine or therapy. Carefully read the information on the package. Do not use any other product containing nicotine while using a nicotine replacement product.  RELAPSE OR DIFFICULT SITUATIONS Most relapses occur within the first 3 months after quitting. Do not be discouraged if you start smoking again. Remember, most people try several times before finally quitting. You may have symptoms of withdrawal because your body is used to nicotine. You may crave cigarettes, be irritable, feel very hungry, cough often, get headaches, or have difficulty concentrating. The withdrawal symptoms are only temporary. They are strongest when you first quit, but they will go away within  10 14 days. To reduce the chances of relapse, try to:  Avoid drinking alcohol. Drinking lowers your chances of successfully quitting.  Reduce the amount of caffeine you consume. Once you quit smoking, the amount of caffeine in your body increases and can give you symptoms, such as a rapid heartbeat, sweating, and anxiety.  Avoid smokers because they can make you want to smoke.  Do not let weight gain distract you. Many smokers will gain weight when they quit, usually less than 10 pounds. Eat a healthy diet and stay active. You can always lose the weight gained after you quit.  Find ways to improve your mood other than smoking. FOR MORE INFORMATION  www.smokefree.gov  Document Released: 02/10/2001 Document Revised: 08/18/2011 Document Reviewed: 05/28/2011 [employer] Patient Information 2013 [employer].  

## 2012-06-15 NOTE — Progress Notes (Signed)
Subjective:     Patient ID: Melissa Walker, female   DOB: [birth year], 32 y.o.   MRN: [id number]  HPI Pt was seen in March for irreg and prolonged vaginal bleeding.  She was treated for BV and her sx have resolved.  She is here for f/u.  No complaints.    Review of Systems     Objective:   Physical Exam BP 120/77  Pulse 84  Temp(Src) 97.2 F (36.2 C) (Oral)  Ht 5\' 3"  (1.6 m)  Wt 82 lb 9.6 oz (37.467 kg)  BMI 14.64 kg/m2  LMP 06/07/2012  Exam deferred  04/28/2012 Findings:  Uterus: Is retroverted and retroflexed and demonstrates a sagittal  length of 7.5 cm, depth of 4.9 cm and width of 5.2 cm. A  homogeneous uterine myometrium is seen.  Endometrium: Appears trilayered with a width of 7.5 mm. No areas  of focal thickening or heterogeneity are seen and this would  correlate with a periovulatory phase of cycle despite the provided  LMP of 04/27/2012.  Right ovary: Measures 6.2 x 3.5 x 4.3 cm and contains a collapsing  unilocular thin-walled simple cyst measuring 5.6 x 3.0 x 3.9 cm  Left ovary: Measures 4.7 x 2.5 x 3.1 cm and contains a dominant  follicle as well as a collapsing unilocular cystic area which  measures 2.4 x 1.2 x 1.4 cm and contains some internal avascular  debris compatible with a collapsing hemorrhagic cyst.  Other findings: No pelvic fluid or separate adnexal masses are  seen.  IMPRESSION:  Simple right ovarian cyst as described above. In a premenopausal  patient, a cyst of this size with this appearance is most likely  functional and benign. No further follow-up for this finding is  needed.  Collapsing left hemorrhagic cyst as described above has a typical  appearance and also requires no further follow-up.  Normal myometrium. Endometrial appearance suggesting a  periovulatory phase cycle       Assessment:     Abnormal menses- resolved  Reviewed results of sono    Plan:     F/u prn

## 2012-08-17 ENCOUNTER — Ambulatory Visit: Payer: No Typology Code available for payment source

## 2012-08-18 ENCOUNTER — Ambulatory Visit: Payer: No Typology Code available for payment source

## 2012-10-19 ENCOUNTER — Ambulatory Visit (INDEPENDENT_AMBULATORY_CARE_PROVIDER_SITE_OTHER): Payer: No Typology Code available for payment source | Admitting: Internal Medicine

## 2012-10-19 ENCOUNTER — Encounter: Payer: Self-pay | Admitting: Internal Medicine

## 2012-10-19 VITALS — BP 110/66 | HR 79 | Temp 97.3°F | Ht 63.0 in | Wt 85.7 lb

## 2012-10-19 DIAGNOSIS — M545 Low back pain: Secondary | ICD-10-CM

## 2012-10-19 MED ORDER — METHOCARBAMOL 500 MG PO TABS: 750.0000 mg | ORAL_TABLET | Freq: Four times a day (QID) | ORAL | Status: DC

## 2012-10-19 NOTE — Patient Instructions (Signed)
I have increased your dose of Robaxin to 750 mg every 6 hours  Please call clinic with questions  Please follow up in 3 months

## 2012-10-19 NOTE — Progress Notes (Signed)
Patient ID: Melissa Walker, female   DOB: [birth year], 32 y.o.   MRN: [id number]   Subjective:   HPI: [name] is a 32 y.o. woman with past medical history of chronic back pain with back spasms, presents to the clinic complaining of increasing back spasms for the last several months.  She has an extensive past medical history of spasms involving her back and associated with pain. Her history goes back 10 years ago, when she had a fall where she landed on her backside. This happened just a few days after her child birth. She has also been informed that she has a bulging disc in her back. She has been evaluated by a pain specialist at [facility], who after trying several strategies discharged patient as he did not have much else to offer - according to the patient. The remaining options, according to the patient, was spinal nerve stimulation therapy or nerve blocks, which she was hesitant to try. I do not have formal records regarding all this. She otherwise currently denies leg numbness, tingling, or paresthesias. No urinary or fecal incontinence. Patient is very independent, but the spasms, tend to increase after  a few minutes of standing or sitting.  She continues to use Robaxin 500 mg every 6 hours when necessary. She reports that this medication is to give her relief. In terms of her blood spasms that over that time. She has not started the effect is a week. She wonders whether he can increase this does or increased frequence.  She does not have any other complaints, but she requests for completion of her disability sticker.    Past Medical History  Diagnosis Date  . Neuromuscular disorder   . Tobacco abuse   . Chronic back pain   . Abnormal uterine bleeding 02/04/2012    Occurred 01/12/12-01/15/12. Evaluated in [facility] and all labs, exam, and pelvic u/s WNL. Given rx for ibuprofen and told to follow up as otupatient.     Current Outpatient Prescriptions  Medication Sig  Dispense Refill  . methocarbamol (ROBAXIN) 500 MG tablet Take 1.5 tablets (750 mg total) by mouth 4 (four) times daily.  180 tablet  5   No current facility-administered medications for this visit.   Family History  Problem Relation Age of Onset  . Eclampsia Maternal Grandmother   . Heart attack Maternal Grandmother   . Cancer Maternal Grandmother     lung  . Heart disease Maternal Grandmother   . Hyperlipidemia Maternal Grandmother   . Hypertension Maternal Grandmother    History   Social History  . Marital Status: Single    Spouse Name: N/A    Number of Children: N/A  . Years of Education: N/A   Social History Main Topics  . Smoking status: Current Every Day Smoker -- 0.50 packs/day    Types: Cigarettes  . Smokeless tobacco: Never Used     Comment: TRYING TO QUIT  . Alcohol Use: No     Comment: occasional  . Drug Use: No  . Sexual Activity: Yes   Other Topics Concern  . None   Social History Narrative  . None   Review of Systems: Constitutional: Denies fever, chills, diaphoresis, appetite change and fatigue.  Respiratory: Denies SOB, DOE, cough, chest tightness, and wheezing.  Cardiovascular: No chest pain, palpitations and leg swelling.  Gastrointestinal: No abdominal pain, nausea, vomiting, bloody stools Genitourinary: No dysuria, frequency, hematuria, or flank pain.    Objective:  Physical Exam: Filed Vitals:  10/19/12 1002  BP: 110/66  Pulse: 79  Temp: 97.3 F (36.3 C)  TempSrc: Oral  Height: 5\' 3"  (1.6 m)  Weight: 85 lb 11.2 oz (38.873 kg)  SpO2: 100%   General: Small bodied. No acute distress.  Lungs: CTA bilaterally. Heart: RRR; no extra sounds or murmurs  Abdomen: Non-distended, normal BS, soft, nontender; no hepatosplenomegaly  Extremities: No pedal edema. No joint swelling or tenderness. Neurologic: Alert and oriented x3. No obvious neurologic deficits.  Assessment & Plan:  I have discussed my assessment and plan  with Dr. Criselda Peaches as  detailed under problem based charting.

## 2012-10-19 NOTE — Assessment & Plan Note (Signed)
Patient complains of increasing back spasms on 500 mg of Robaxin every 6 hours. Physical examination is unremarkable. No medical records from [facility], where she has received extensive evaluation. I have increased her dose of Robaxin from 500 to 750 mg qid. I have encouraged the patient to return to the clinic if her symptoms do not improve. I have completed a permanent disability sticker. Will need to obtain records.

## 2012-10-24 NOTE — Progress Notes (Signed)
Case discussed with Dr.Kazibwe at the time of the visit.  We reviewed the resident's history and exam and pertinent patient test results.  I agree with the assessment, diagnosis, and plan of care documented in the resident's note.    

## 2012-11-01 ENCOUNTER — Encounter (HOSPITAL_COMMUNITY): Payer: Self-pay | Admitting: Emergency Medicine

## 2012-11-01 ENCOUNTER — Emergency Department (INDEPENDENT_AMBULATORY_CARE_PROVIDER_SITE_OTHER)
Admission: EM | Admit: 2012-11-01 | Discharge: 2012-11-01 | Disposition: A | Discharge status: Home / Self Care | Payer: No Typology Code available for payment source | Source: Home / Self Care | Attending: Family Medicine | Admitting: Family Medicine

## 2012-11-01 DIAGNOSIS — R05 Cough: Secondary | ICD-10-CM

## 2012-11-01 DIAGNOSIS — R0982 Postnasal drip: Secondary | ICD-10-CM

## 2012-11-01 MED ORDER — CETIRIZINE-PSEUDOEPHEDRINE ER 5-120 MG PO TB12: 1.0000 | ORAL_TABLET | Freq: Two times a day (BID) | ORAL | Status: DC | PRN

## 2012-11-01 MED ORDER — METHYLPREDNISOLONE 4 MG PO KIT: PACK | ORAL | Status: DC

## 2012-11-01 MED ORDER — HYDROCOD POLST-CHLORPHEN POLST 10-8 MG/5ML PO LQCR: 5.0000 mL | Freq: Two times a day (BID) | ORAL | Status: DC | PRN

## 2012-11-01 NOTE — ED Notes (Signed)
C/o nonproductive cough with sob. Denies hx of asthma. Nausea. Runny nose. Denies fever v/d. Pt has tried otc meds with no relief.  Symptoms present since Sunday.

## 2012-11-01 NOTE — ED Provider Notes (Signed)
Medical screening examination/treatment/procedure(s) were performed by resident physician or non-physician practitioner and as supervising physician I was immediately available for consultation/collaboration.   Barkley Bruns MD.   Linna Hoff, MD 11/01/12 [phone]

## 2012-11-01 NOTE — ED Provider Notes (Signed)
CSN: [id number]     Arrival date & time 11/01/12  0803 History   First MD Initiated Contact with Patient 11/01/12 0840     Chief Complaint  Patient presents with  . Cough   (Consider location/radiation/quality/duration/timing/severity/associated sxs/prior Treatment) HPI Comments: 32 year old female presents complaining of cough and runny nose. This started 2 days ago with a runny nose and she states she knows that it is raw from blowing it so much. The cough started yesterday which prompted her to come in to be seen. The cough is worse at night when she lays down to sleep and has been keeping her awake. She has no symptoms apart from the runny nose and cough. There is no shortness of breath, sore throat, fever, chills, CP, pleuritic pain, NVD. She does not feel sick at this time.   Past Medical History  Diagnosis Date  . Neuromuscular disorder   . Tobacco abuse   . Chronic back pain   . Abnormal uterine bleeding 02/04/2012    Occurred 01/12/12-01/15/12. Evaluated in [facility] and all labs, exam, and pelvic u/s WNL. Given rx for ibuprofen and told to follow up as otupatient.     Past Surgical History  Procedure Laterality Date  . Dental rehabilitation      full set dentures  . Multiple epidural steroid injections in back      Over past 2 years   Family History  Problem Relation Age of Onset  . Eclampsia Maternal Grandmother   . Heart attack Maternal Grandmother   . Cancer Maternal Grandmother     lung  . Heart disease Maternal Grandmother   . Hyperlipidemia Maternal Grandmother   . Hypertension Maternal Grandmother    History  Substance Use Topics  . Smoking status: Current Every Day Smoker -- 0.50 packs/day    Types: Cigarettes  . Smokeless tobacco: Never Used     Comment: TRYING TO QUIT  . Alcohol Use: No     Comment: occasional   OB History   Grav Para Term Preterm Abortions TAB SAB Ect Mult Living   1 1 1       1      Review of Systems  Constitutional: Negative for fever  and chills.  HENT: Positive for rhinorrhea and postnasal drip. Negative for ear pain, sore throat and sinus pressure.   Eyes: Negative for visual disturbance.  Respiratory: Positive for cough. Negative for chest tightness and shortness of breath.   Cardiovascular: Negative for chest pain, palpitations and leg swelling.  Gastrointestinal: Negative for nausea, vomiting and abdominal pain.  Endocrine: Negative for polydipsia and polyuria.  Genitourinary: Negative for dysuria, urgency and frequency.  Musculoskeletal: Negative for myalgias and arthralgias.  Skin: Negative for rash.  Neurological: Negative for dizziness, weakness and light-headedness.    Allergies  Penicillins and Nicotine  Home Medications   Current Outpatient Rx  Name  Route  Sig  Dispense  Refill  . methocarbamol (ROBAXIN) 500 MG tablet   Oral   Take 1.5 tablets (750 mg total) by mouth 4 (four) times daily.   180 tablet   5   . cetirizine-pseudoephedrine (ZYRTEC-D) 5-120 MG per tablet   Oral   Take 1 tablet by mouth 2 (two) times daily as needed for allergies.   60 tablet   0   . chlorpheniramine-HYDROcodone (TUSSIONEX PENNKINETIC ER) 10-8 MG/5ML LQCR   Oral   Take 5 mLs by mouth every 12 (twelve) hours as needed.   70 mL   0   .  methylPREDNISolone (MEDROL DOSEPAK) 4 MG tablet      Use as directed   21 tablet   0     Dispense as written.    BP 115/72  Pulse 90  Temp(Src) 98 F (36.7 C) (Oral)  Resp 18  SpO2 100%  LMP 10/17/2012 Physical Exam  Nursing note and vitals reviewed. Constitutional: She is oriented to person, place, and time. She appears well-developed and well-nourished. No distress.  HENT:  Head: Normocephalic and atraumatic.  Right Ear: External ear normal.  Left Ear: External ear normal.  Nose: Nose normal.  Mouth/Throat: Oropharynx is clear and moist. No oropharyngeal exudate.  Eyes: Conjunctivae are normal. Pupils are equal, round, and reactive to light. Right eye exhibits no  discharge. Left eye exhibits no discharge.  Cardiovascular: Normal rate, regular rhythm and normal heart sounds.   Pulmonary/Chest: Effort normal. No respiratory distress. She has no wheezes. She has no rales. She exhibits no tenderness.  Musculoskeletal: Normal range of motion.  Neurological: She is alert and oriented to person, place, and time. No cranial nerve deficit. Coordination normal.  Skin: Skin is dry. No rash noted. She is not diaphoretic.  Psychiatric: She has a normal mood and affect. Judgment normal.    ED Course  Procedures (including critical care time) Labs Review Labs Reviewed - No data to display Imaging Review No results found.  MDM   1. Allergic cough   2. Post-nasal drip    Patient is afebrile, nontoxic. I believe this cough is being caused by postnasal drip. We'll treat this, she will followup if not improving. May use Tussionex for a few days at night as the cough has been keeping her up at night.  Meds ordered this encounter  Medications  . cetirizine-pseudoephedrine (ZYRTEC-D) 5-120 MG per tablet    Sig: Take 1 tablet by mouth 2 (two) times daily as needed for allergies.    Dispense:  60 tablet    Refill:  0  . methylPREDNISolone (MEDROL DOSEPAK) 4 MG tablet    Sig: Use as directed    Dispense:  21 tablet    Refill:  0  . chlorpheniramine-HYDROcodone (TUSSIONEX PENNKINETIC ER) 10-8 MG/5ML LQCR    Sig: Take 5 mLs by mouth every 12 (twelve) hours as needed.    Dispense:  70 mL    Refill:  0     Graylon Good, PA-C 11/01/12 [phone]

## 2012-11-15 ENCOUNTER — Emergency Department (INDEPENDENT_AMBULATORY_CARE_PROVIDER_SITE_OTHER)
Admission: EM | Admit: 2012-11-15 | Discharge: 2012-11-15 | Disposition: A | Discharge status: Home / Self Care | Payer: No Typology Code available for payment source | Source: Home / Self Care | Attending: Emergency Medicine | Admitting: Emergency Medicine

## 2012-11-15 ENCOUNTER — Encounter (HOSPITAL_COMMUNITY): Payer: Self-pay | Admitting: *Deleted

## 2012-11-15 DIAGNOSIS — H6000 Abscess of external ear, unspecified ear: Secondary | ICD-10-CM

## 2012-11-15 DIAGNOSIS — L0202 Furuncle of face: Secondary | ICD-10-CM

## 2012-11-15 MED ORDER — TRAMADOL HCL 50 MG PO TABS: 100.0000 mg | ORAL_TABLET | Freq: Three times a day (TID) | ORAL | Status: DC | PRN

## 2012-11-15 MED ORDER — DOXYCYCLINE HYCLATE 100 MG PO TABS: 100.0000 mg | ORAL_TABLET | Freq: Two times a day (BID) | ORAL | Status: DC

## 2012-11-15 NOTE — ED Provider Notes (Signed)
Chief Complaint:   Chief Complaint  Patient presents with  . Otalgia    History of Present Illness:   Melissa Walker is a 32 year old female who has had a three-day history of pain in her left ear. She denies any drainage or difficulty hearing. She has had some nasal congestion, rhinorrhea, and cough. She was seen here about 2 weeks ago for an upper respiratory infection. She denies fever, chills, or sore throat.  Review of Systems:  Other than noted above, the patient denies any of the following symptoms: Systemic:  No fevers, chills, sweats, weight loss or gain, fatigue, or tiredness. Eye:  No redness, pain, discharge, itching, blurred vision, or diplopia. ENT:  No headache, nasal congestion, sneezing, itching, epistaxis, ear pain, congestion, decreased hearing, ringing in ears, vertigo, or tinnitus.  No oral lesions, sore throat, pain on swallowing, or hoarseness. Neck:  No mass, tenderness or adenopathy. Lungs:  No coughing, wheezing, or shortness of breath. Skin:  No rash or itching.  PMFSH:  Past medical history, family history, social history, meds, and allergies were reviewed. She takes Robaxin for low back pain.  Physical Exam:   Vital signs:  BP 112/67  Pulse 77  Temp(Src) 98.1 F (36.7 C) (Oral)  Resp 16  SpO2 100%  LMP 10/17/2012 General:  Alert and oriented.  In no distress.  Skin warm and dry. Eye:  PERRL, full EOMs, lids and conjunctiva normal.   ENT:  There is a small, tender furuncle in the superior left ear canal, just inside the ear canal. This was not fluctuant and there was no purulent drainage. The remainder the canal and TM are normal. Right canal and TM are normal.  Nasal mucosa not congested and without drainage.  Mucous membranes moist, no oral lesions, normal dentition, pharynx clear.  No cranial or facial pain to palplation. Neck:  Supple, full ROM.  No adenopathy, tenderness or mass.  Thyroid normal. Lungs:  Breath sounds clear and equal bilaterally.  No  wheezes, rales or rhonchi. Heart:  Rhythm regular, without extrasystoles.  No gallops or murmers. Skin:  Clear, warm and dry.  Assessment:  The encounter diagnosis was Furuncle of ear canal.  Plan:   1.  Meds:  The following meds were prescribed:   Discharge Medication List as of 11/15/2012  1:55 PM    START taking these medications   Details  doxycycline (VIBRA-TABS) 100 MG tablet Take 1 tablet (100 mg total) by mouth 2 (two) times daily., Starting 11/15/2012, Until Discontinued, Normal    traMADol (ULTRAM) 50 MG tablet Take 2 tablets (100 mg total) by mouth every 8 (eight) hours as needed for pain., Starting 11/15/2012, Until Discontinued, Normal        2.  Patient Education/Counseling:  The patient was given appropriate handouts, self care instructions, and instructed in symptomatic relief.  Instructed to apply heat and antibiotic ointment.  3.  Follow up:  The patient was told to follow up if no better in 3 to 4 days, if becoming worse in any way, and given some red flag symptoms such as worsening pain or drainage which would prompt immediate return.  Follow up here if necessary.     Reuben Likes, MD 11/15/12 2157

## 2012-11-15 NOTE — ED Notes (Signed)
Pt  Reports  l  Earache      X  2  Days  Reports  The  Canal  Seems  Sore    -    denys  Any  Known  Fb      denys  Ant  Eardrum pain       Pt  States  Had  Jari Favre  Ago  But that  Is  Better

## 2012-12-22 ENCOUNTER — Encounter: Payer: Self-pay | Admitting: Obstetrics & Gynecology

## 2012-12-22 ENCOUNTER — Other Ambulatory Visit: Payer: Self-pay | Admitting: Obstetrics & Gynecology

## 2012-12-22 ENCOUNTER — Ambulatory Visit (INDEPENDENT_AMBULATORY_CARE_PROVIDER_SITE_OTHER): Payer: No Typology Code available for payment source | Admitting: Obstetrics & Gynecology

## 2012-12-22 VITALS — BP 119/75 | Temp 97.4°F | Wt 91.8 lb

## 2012-12-22 DIAGNOSIS — O99332 Smoking (tobacco) complicating pregnancy, second trimester: Secondary | ICD-10-CM

## 2012-12-22 DIAGNOSIS — Z23 Encounter for immunization: Secondary | ICD-10-CM

## 2012-12-22 DIAGNOSIS — O0930 Supervision of pregnancy with insufficient antenatal care, unspecified trimester: Secondary | ICD-10-CM | POA: Insufficient documentation

## 2012-12-22 DIAGNOSIS — O99330 Smoking (tobacco) complicating pregnancy, unspecified trimester: Secondary | ICD-10-CM | POA: Insufficient documentation

## 2012-12-22 DIAGNOSIS — O0932 Supervision of pregnancy with insufficient antenatal care, second trimester: Secondary | ICD-10-CM

## 2012-12-22 LAB — POCT URINALYSIS DIP (DEVICE)
Glucose, UA: NEGATIVE mg/dL
Hgb urine dipstick: NEGATIVE
Nitrite: NEGATIVE
Urobilinogen, UA: 0.2 mg/dL (ref 0.0–1.0)

## 2012-12-22 MED ORDER — CYCLOBENZAPRINE HCL 10 MG PO TABS: 10.0000 mg | ORAL_TABLET | Freq: Three times a day (TID) | ORAL | Status: DC | PRN

## 2012-12-22 NOTE — Progress Notes (Signed)
P= 95 Discussed appropriate/expected weight gain based on height and weight. Pt. Verbalized understanding.  Pt. Will get flu vaccine today.

## 2012-12-22 NOTE — Progress Notes (Signed)
Nutrition note: 1st visit consult Pt has h/o being underwt. Pt has gained 3.8# @ [redacted]w[redacted]d, which is < expected. Pt reports eating 3 meals & 2 snacks/d. Pt is taking PNV. Pt reports no N/V or heartburn. Pt received verbal & written education about general nutrition during pregnancy. Encouraged including a protein source with all meals & snacks. Discussed benefits of BF. Discussed wt gain goals of 28-40# or 1#/wk. Pt agrees to continue taking PNV. Pt does not have WIC but plans to apply. Pt does not plan to BF. F/u in 4-6 wks Blondell Reveal, MS, RD, LDN

## 2012-12-22 NOTE — Patient Instructions (Signed)
Pregnancy - Second Trimester The second trimester of pregnancy (3 to 6 months) is a period of rapid growth for you and your baby. At the end of the sixth month, your baby is about 9 inches long and weighs 1 1/2 pounds. You will begin to feel the baby move between 18 and 20 weeks of the pregnancy. This is called quickening. Weight gain is faster. A clear fluid (colostrum) may leak out of your breasts. You may feel small contractions of the womb (uterus). This is known as false labor or Braxton-Hicks contractions. This is like a practice for labor when the baby is ready to be born. Usually, the problems with morning sickness have usually passed by the end of your first trimester. Some women develop small dark blotches (called cholasma, mask of pregnancy) on their face that usually goes away after the baby is born. Exposure to the sun makes the blotches worse. Acne may also develop in some pregnant women and pregnant women who have acne, may find that it goes away. PRENATAL EXAMS  Blood work may continue to be done during prenatal exams. These tests are done to check on your health and the probable health of your baby. Blood work is used to follow your blood levels (hemoglobin). Anemia (low hemoglobin) is common during pregnancy. Iron and vitamins are given to help prevent this. You will also be checked for diabetes between 24 and 28 weeks of the pregnancy. Some of the previous blood tests may be repeated.  The size of the uterus is measured during each visit. This is to make sure that the baby is continuing to grow properly according to the dates of the pregnancy.  Your blood pressure is checked every prenatal visit. This is to make sure you are not getting toxemia.  Your urine is checked to make sure you do not have an infection, diabetes or protein in the urine.  Your weight is checked often to make sure gains are happening at the suggested rate. This is to ensure that both you and your baby are  growing normally.  Sometimes, an ultrasound is performed to confirm the proper growth and development of the baby. This is a test which bounces harmless sound waves off the baby so your caregiver can more accurately determine due dates. Sometimes, a test is done on the amniotic fluid surrounding the baby. This test is called an amniocentesis. The amniotic fluid is obtained by sticking a needle into the belly (abdomen). This is done to check the chromosomes in instances where there is a concern about possible genetic problems with the baby. It is also sometimes done near the end of pregnancy if an early delivery is required. In this case, it is done to help make sure the baby's lungs are mature enough for the baby to live outside of the womb. CHANGES OCCURING IN THE SECOND TRIMESTER OF PREGNANCY Your body goes through many changes during pregnancy. They vary from person to person. Talk to your caregiver about changes you notice that you are concerned about.  During the second trimester, you will likely have an increase in your appetite. It is normal to have cravings for certain foods. This varies from person to person and pregnancy to pregnancy.  Your lower abdomen will begin to bulge.  You may have to urinate more often because the uterus and baby are pressing on your bladder. It is also common to get more bladder infections during pregnancy. You can help this by drinking lots of fluids   and emptying your bladder before and after intercourse.  You may begin to get stretch marks on your hips, abdomen, and breasts. These are normal changes in the body during pregnancy. There are no exercises or medicines to take that prevent this change.  You may begin to develop swollen and bulging veins (varicose veins) in your legs. Wearing support hose, elevating your feet for 15 minutes, 3 to 4 times a day and limiting salt in your diet helps lessen the problem.  Heartburn may develop as the uterus grows and  pushes up against the stomach. Antacids recommended by your caregiver helps with this problem. Also, eating smaller meals 4 to 5 times a day helps.  Constipation can be treated with a stool softener or adding bulk to your diet. Drinking lots of fluids, and eating vegetables, fruits, and whole grains are helpful.  Exercising is also helpful. If you have been very active up until your pregnancy, most of these activities can be continued during your pregnancy. If you have been less active, it is helpful to start an exercise program such as walking.  Hemorrhoids may develop at the end of the second trimester. Warm sitz baths and hemorrhoid cream recommended by your caregiver helps hemorrhoid problems.  Backaches may develop during this time of your pregnancy. Avoid heavy lifting, wear low heal shoes, and practice good posture to help with backache problems.  Some pregnant women develop tingling and numbness of their hand and fingers because of swelling and tightening of ligaments in the wrist (carpel tunnel syndrome). This goes away after the baby is born.  As your breasts enlarge, you may have to get a bigger bra. Get a comfortable, cotton, support bra. Do not get a nursing bra until the last month of the pregnancy if you will be nursing the baby.  You may get a dark line from your belly button to the pubic area called the linea nigra.  You may develop rosy cheeks because of increase blood flow to the face.  You may develop spider looking lines of the face, neck, arms, and chest. These go away after the baby is born. HOME CARE INSTRUCTIONS   It is extremely important to avoid all smoking, herbs, alcohol, and unprescribed drugs during your pregnancy. These chemicals affect the formation and growth of the baby. Avoid these chemicals throughout the pregnancy to ensure the delivery of a healthy infant.  Most of your home care instructions are the same as suggested for the first trimester of your  pregnancy. Keep your caregiver's appointments. Follow your caregiver's instructions regarding medicine use, exercise, and diet.  During pregnancy, you are providing food for you and your baby. Continue to eat regular, well-balanced meals. Choose foods such as meat, fish, milk and other low fat dairy products, vegetables, fruits, and whole-grain breads and cereals. Your caregiver will tell you of the ideal weight gain.  A physical sexual relationship may be continued up until near the end of pregnancy if there are no other problems. Problems could include early (premature) leaking of amniotic fluid from the membranes, vaginal bleeding, abdominal pain, or other medical or pregnancy problems.  Exercise regularly if there are no restrictions. Check with your caregiver if you are unsure of the safety of some of your exercises. The greatest weight gain will occur in the last 2 trimesters of pregnancy. Exercise will help you:  Control your weight.  Get you in shape for labor and delivery.  Lose weight after you have the baby.  Wear   a good support or jogging bra for breast tenderness during pregnancy. This may help if worn during sleep. Pads or tissues may be used in the bra if you are leaking colostrum.  Do not use hot tubs, steam rooms or saunas throughout the pregnancy.  Wear your seat belt at all times when driving. This protects you and your baby if you are in an accident.  Avoid raw meat, uncooked cheese, cat litter boxes, and soil used by cats. These carry germs that can cause birth defects in the baby.  The second trimester is also a good time to visit your dentist for your dental health if this has not been done yet. Getting your teeth cleaned is okay. Use a soft toothbrush. Brush gently during pregnancy.  It is easier to leak urine during pregnancy. Tightening up and strengthening the pelvic muscles will help with this problem. Practice stopping your urination while you are going to the  bathroom. These are the same muscles you need to strengthen. It is also the muscles you would use as if you were trying to stop from passing gas. You can practice tightening these muscles up 10 times a set and repeating this about 3 times per day. Once you know what muscles to tighten up, do not perform these exercises during urination. It is more likely to contribute to an infection by backing up the urine.  Ask for help if you have financial, counseling, or nutritional needs during pregnancy. Your caregiver will be able to offer counseling for these needs as well as refer you for other special needs.  Your skin may become oily. If so, wash your face with mild soap, use non-greasy moisturizer and oil or cream based makeup. MEDICINES AND DRUG USE IN PREGNANCY  Take prenatal vitamins as directed. The vitamin should contain 1 milligram of folic acid. Keep all vitamins out of reach of children. Only a couple vitamins or tablets containing iron may be fatal to a baby or young child when ingested.  Avoid use of all medicines, including herbs, over-the-counter medicines, not prescribed or suggested by your caregiver. Only take over-the-counter or prescription medicines for pain, discomfort, or fever as directed by your caregiver. Do not use aspirin.  Let your caregiver also know about herbs you may be using.  Alcohol is related to a number of birth defects. This includes fetal alcohol syndrome. All alcohol, in any form, should be avoided completely. Smoking will cause low birth rate and premature babies.  Street or illegal drugs are very harmful to the baby. They are absolutely forbidden. A baby born to an addicted mother will be addicted at birth. The baby will go through the same withdrawal an adult does. SEEK MEDICAL CARE IF:  You have any concerns or worries during your pregnancy. It is better to call with your questions if you feel they cannot wait, rather than worry about them. SEEK IMMEDIATE  MEDICAL CARE IF:   An unexplained oral temperature above 102 F (38.9 C) develops, or as your caregiver suggests.  You have leaking of fluid from the vagina (birth canal). If leaking membranes are suspected, take your temperature and tell your caregiver of this when you call.  There is vaginal spotting, bleeding, or passing clots. Tell your caregiver of the amount and how many pads are used. Light spotting in pregnancy is common, especially following intercourse.  You develop a bad smelling vaginal discharge with a change in the color from clear to white.  You continue to feel   sick to your stomach (nauseated) and have no relief from remedies suggested. You vomit blood or coffee ground-like materials.  You lose more than 2 pounds of weight or gain more than 2 pounds of weight over 1 week, or as suggested by your caregiver.  You notice swelling of your face, hands, feet, or legs.  You get exposed to German measles and have never had them.  You are exposed to fifth disease or chickenpox.  You develop belly (abdominal) pain. Round ligament discomfort is a common non-cancerous (benign) cause of abdominal pain in pregnancy. Your caregiver still must evaluate you.  You develop a bad headache that does not go away.  You develop fever, diarrhea, pain with urination, or shortness of breath.  You develop visual problems, blurry, or double vision.  You fall or are in a car accident or any kind of trauma.  There is mental or physical violence at home. Document Released: 02/10/2001 Document Revised: 11/11/2011 Document Reviewed: 08/15/2008 [employer] Patient Information 2014 [employer].  Breastfeeding A change in hormones during your pregnancy causes growth of your breast tissue and an increase in number and size of milk ducts. The hormone prolactin allows proteins, sugars, and fats from your blood supply to make breast milk in your milk-producing glands. The hormone progesterone prevents  breast milk from being released before the birth of your baby. After the birth of your baby, your progesterone level decreases allowing breast milk to be released. Thoughts of your baby, as well as his or her sucking or crying, can stimulate the release of milk from the milk-producing glands. Deciding to breastfeed (nurse) is one of the best choices you can make for you and your baby. The information that follows gives a brief review of the benefits, as well as other important skills to know about breastfeeding. BENEFITS OF BREASTFEEDING For your baby  The first milk (colostrum) helps your baby's digestive system function better.   There are antibodies in your milk that help your baby fight off infections.   Your baby has a lower incidence of asthma, allergies, and sudden infant death syndrome (SIDS).   The nutrients in breast milk are better for your baby than infant formulas.  Breast milk improves your baby's brain development.   Your baby will have less gas, colic, and constipation.  Your baby is less likely to develop other conditions, such as childhood obesity, asthma, or diabetes mellitus. For you  Breastfeeding helps develop a very special bond between you and your baby.   Breastfeeding is convenient, always available at the correct temperature, and costs nothing.   Breastfeeding helps to burn calories and helps you lose the weight gained during pregnancy.   Breastfeeding makes your uterus contract back down to normal size faster and slows bleeding following delivery.   Breastfeeding mothers have a lower risk of developing osteoporosis or breast or ovarian cancer later in life.  BREASTFEEDING FREQUENCY  A healthy, full-term baby may breastfeed as often as every hour or space his or her feedings to every 3 hours. Breastfeeding frequency will vary from baby to baby.   Newborns should be fed no less than every 2 3 hours during the day and every 4 5 hours during the  night. You should breastfeed a minimum of 8 feedings in a 24 hour period.  Awaken your baby to breastfeed if it has been 3 4 hours since the last feeding.  Breastfeed when you feel the need to reduce the fullness of your breasts or when   your newborn shows signs of hunger. Signs that your baby may be hungry include:  Increased alertness or activity.  Stretching.  Movement of the head from side to side.  Movement of the head and opening of the mouth when the corner of the mouth or cheek is stroked (rooting).  Increased sucking sounds, smacking lips, cooing, sighing, or squeaking.  Hand-to-mouth movements.  Increased sucking of fingers or hands.  Fussing.  Intermittent crying.  Signs of extreme hunger will require calming and consoling before you try to feed your baby. Signs of extreme hunger may include:  Restlessness.  A loud, strong cry.  Screaming.  Frequent feeding will help you make more milk and will help prevent problems, such as sore nipples and engorgement of the breasts.  BREASTFEEDING   Whether lying down or sitting, be sure that the baby's abdomen is facing your abdomen.   Support your breast with 4 fingers under your breast and your thumb above your nipple. Make sure your fingers are well away from your nipple and your baby's mouth.   Stroke your baby's lips gently with your finger or nipple.   When your baby's mouth is open wide enough, place all of your nipple and as much of the colored area around your nipple (areola) as possible into your baby's mouth.  More areola should be visible above his or her upper lip than below his or her lower lip.  Your baby's tongue should be between his or her lower gum and your breast.  Ensure that your baby's mouth is correctly positioned around the nipple (latched). Your baby's lips should create a seal on your breast.  Signs that your baby has effectively latched onto your nipple include:  Tugging or sucking  without pain.  Swallowing heard between sucks.  Absent click or smacking sound.  Muscle movement above and in front of his or her ears with sucking.  Your baby must suck about 2 3 minutes in order to get your milk. Allow your baby to feed on each breast as long as he or she wants. Nurse your baby until he or she unlatches or falls asleep at the first breast, then offer the second breast.  Signs that your baby is full and satisfied include:  A gradual decrease in the number of sucks or complete cessation of sucking.  Falling asleep.  Extension or relaxation of his or her body.  Retention of a small amount of milk in his or her mouth.  Letting go of your breast by himself or herself.  Signs of effective breastfeeding in you include:  Breasts that have increased firmness, weight, and size prior to feeding.  Breasts that are softer after nursing.  Increased milk volume, as well as a change in milk consistency and color by the 5th day of breastfeeding.  Breast fullness relieved by breastfeeding.  Nipples are not sore, cracked, or bleeding.  If needed, break the suction by putting your finger into the corner of your baby's mouth and sliding your finger between his or her gums. Then, remove your breast from his or her mouth.  It is common for babies to spit up a small amount after a feeding.  Babies often swallow air during feeding. This can make babies fussy. Burping your baby between breasts can help with this.  Vitamin D supplements are recommended for babies who get only breast milk.  Avoid using a pacifier during your baby's first 4 6 weeks.  Avoid supplemental feedings of water, formula, or   juice in place of breastfeeding. Breast milk is all the food your baby needs. It is not necessary for your baby to have water or formula. Your breasts will make more milk if supplemental feedings are avoided during the early weeks. HOW TO TELL WHETHER YOUR BABY IS GETTING ENOUGH BREAST  MILK Wondering whether or not your baby is getting enough milk is a common concern among mothers. You can be assured that your baby is getting enough milk if:   Your baby is actively sucking and you hear swallowing.   Your baby seems relaxed and satisfied after a feeding.   Your baby nurses at least 8 12 times in a 24 hour time period.  During the first 3 5 days of age:  Your baby is wetting at least 3 5 diapers in a 24 hour period. The urine should be clear and pale yellow.  Your baby is having at least 3 4 stools in a 24 hour period. The stool should be soft and yellow.  At 5 7 days of age, your baby is having at least 3 6 stools in a 24 hour period. The stool should be seedy and yellow by 5 days of age.  Your baby has a weight loss less than 7 10% during the first 3 days of age.  Your baby does not lose weight after 3 7 days of age.  Your baby gains 4 7 ounces each week after he or she is 4 days of age.  Your baby gains weight by 5 days of age and is back to birth weight within 2 weeks. ENGORGEMENT In the first week after your baby is born, you may experience extremely full breasts (engorgement). When engorged, your breasts may feel heavy, warm, or tender to the touch. Engorgement peaks within 24 48 hours after delivery of your baby.  Engorgement may be reduced by:  Continuing to breastfeed.  Increasing the frequency of breastfeeding.  Taking warm showers or applying warm, moist heat to your breasts just before each feeding. This increases circulation and helps the milk flow.   Gently massaging your breast before and during the feedings. With your fingertips, massage from your chest wall towards your nipple in a circular motion.   Ensuring that your baby empties at least one breast at every feeding. It also helps to start the next feeding on the opposite breast.   Expressing breast milk by hand or by using a breast pump to empty the breasts if your baby is sleepy, or  not nursing well. You may also want to express milk if you are returning to work oryou feel you are getting engorged.  Ensuring your baby is latched on and positioned properly while breastfeeding. If you follow these suggestions, your engorgement should improve in 24 48 hours. If you are still experiencing difficulty, call your lactation consultant or caregiver.  CARING FOR YOURSELF Take care of your breasts.  Bathe or shower daily.   Avoid using soap on your nipples.   Wear a supportive bra. Avoid wearing underwire style bras.  Air dry your nipples for a 3 4minutes after each feeding.   Use only cotton bra pads to absorb breast milk leakage. Leaking of breast milk between feedings is normal.   Use only pure lanolin on your nipples after nursing. You do not need to wash it off before feeding your baby again. Another option is to express a few drops of breast milk and gently massage that milk into your nipples.  Continue   breast self-awareness checks. Take care of yourself.  Eat healthy foods. Alternate 3 meals with 3 snacks.  Avoid foods that you notice affect your baby in a bad way.  Drink milk, fruit juice, and water to satisfy your thirst (about 8 glasses a day).   Rest often, relax, and take your prenatal vitamins to prevent fatigue, stress, and anemia.  Avoid chewing and smoking tobacco.  Avoid alcohol and drug use.  Take over-the-counter and prescribed medicine only as directed by your caregiver or pharmacist. You should always check with your caregiver or pharmacist before taking any new medicine, vitamin, or herbal supplement.  Know that pregnancy is possible while breastfeeding. If desired, talk to your caregiver about family planning and safe birth control methods that may be used while breastfeeding. SEEK MEDICAL CARE IF:   You feel like you want to stop breastfeeding or have become frustrated with breastfeeding.  You have painful breasts or nipples.  Your  nipples are cracked or bleeding.  Your breasts are red, tender, or warm.  You have a swollen area on either breast.  You have a fever or chills.  You have nausea or vomiting.  You have drainage from your nipples.  Your breasts do not become full before feedings by the 5th day after delivery.  You feel sad and depressed.  Your baby is too sleepy to eat well.  Your baby is having trouble sleeping.   Your baby is wetting less than 3 diapers in a 24 hour period.  Your baby has less than 3 stools in a 24 hour period.  Your baby's skin or the white part of his or her eyes becomes more yellow.   Your baby is not gaining weight by 5 days of age. MAKE SURE YOU:   Understand these instructions.  Will watch your condition.  Will get help right away if you are not doing well or get worse. Document Released: 02/16/2005 Document Revised: 11/11/2011 Document Reviewed: 09/23/2011 [employer] Patient Information 2014 [employer].  

## 2012-12-22 NOTE — Progress Notes (Signed)
Subjective:    Melissa Walker is a G2P1001 at [redacted]w[redacted]d being seen today for her first obstetrical visit.  Her obstetrical history is significant for smoking, trying to cut down. Went from 1.5 ppd to 6 cigarettes a day. Patient does not intend to breast feed. Pregnancy history fully reviewed.  Patient reports no complaints.  Filed Vitals:   12/22/12 0908  BP: 119/75  Temp: 97.4 F (36.3 C)  Weight: 91 lb 12.8 oz (41.64 kg)    HISTORY: OB History  Gravida Para Term Preterm AB SAB TAB Ectopic Multiple Living  2 1 1       1     # Outcome Date GA Lbr Len/2nd Weight Sex Delivery Anes PTL Lv  2 CUR           1 TRM 2005 [redacted]w[redacted]d   M SVD EPI  Y     Comments: No complications      Past Medical History  Diagnosis Date  . Neuromuscular disorder   . Tobacco abuse   . Chronic back pain   . Abnormal uterine bleeding 02/04/2012    Occurred 01/12/12-01/15/12. Evaluated in [facility] and all labs, exam, and pelvic u/s WNL. Given rx for ibuprofen and told to follow up as otupatient.     Past Surgical History  Procedure Laterality Date  . Dental rehabilitation      full set dentures  . Multiple epidural steroid injections in back      Over past 2 years   Family History  Problem Relation Age of Onset  . Eclampsia Maternal Grandmother   . Heart attack Maternal Grandmother   . Cancer Maternal Grandmother     lung  . Heart disease Maternal Grandmother   . Hyperlipidemia Maternal Grandmother   . Hypertension Maternal Grandmother   . Cancer Father     brain tumor      Exam    Uterus:     Pelvic Exam:    Perineum: No Hemorrhoids, Normal Perineum   Vulva: normal   Vagina:  normal mucosa, normal discharge   Cervix: no bleeding following Pap   Adnexa: normal adnexa and no mass, fullness, tenderness   Bony Pelvis: average  System: Breast:  normal appearance, no masses or tenderness   Skin: normal coloration and turgor, no rashes   Neurologic: normal   Extremities: normal strength, tone,  and muscle mass   HEENT PERRLA   Mouth/Teeth mucous membranes moist, pharynx normal without lesions and dental hygiene good   Neck supple and no masses   Cardiovascular: regular rate and rhythm   Respiratory:  appears well, vitals normal, no respiratory distress, acyanotic, normal RR, chest clear, no wheezing, crepitations, rhonchi, normal symmetric air entry   Abdomen: soft, non-tender; bowel sounds normal; no masses,  no organomegaly   Urinary: urethral meatus normal      Assessment:    Pregnancy: G2P1001 Patient Active Problem List   Diagnosis Date Noted  . Late prenatal care 12/22/2012    Priority: High  . Smoking complicating pregnancy, antepartum 12/22/2012    Priority: High  . 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 Superficial bruising of lower leg 03/10/2012  . Abnormal weight loss 02/18/2012  . Radiculopathy, lumbar region 11/27/2010  . SKIN LESION 09/14/2008  . LOW BACK PAIN, CHRONIC 04/13/2008  . OTHER DISORDER OF COCCYX 10/26/2007  . ACNE, MILD 06/29/2007  . NECK MASS 05/25/2007  . MOLE 03/12/2006  . TOBACCO ABUSE 02/02/2006     Plan:   Initial labs and  quad screen drawn. Flu vaccine given today. Encouraged to continue smoking cessation Prenatal vitamins. Problem list reviewed and updated. Genetic Screening discussed Quad Screen: to be drawn later Ultrasound discussed; fetal survey: ordered. Follow up in 4 weeks.   Tereso Newcomer, MD 12/22/2012

## 2012-12-23 LAB — PRESCRIPTION MONITORING PROFILE (19 PANEL)
Carisoprodol, Urine: NEGATIVE ng/mL
Cocaine Metabolites: NEGATIVE ng/mL
Creatinine, Urine: 32.48 mg/dL (ref >20.0)
Fentanyl, Ur: NEGATIVE ng/mL
MDMA URINE: NEGATIVE ng/mL
Meperidine, Ur: NEGATIVE ng/mL
Methadone Screen, Urine: NEGATIVE ng/mL
Nitrites, Initial: NEGATIVE ug/mL
Oxycodone Screen, Ur: NEGATIVE ng/mL
Propoxyphene: NEGATIVE ng/mL
Tramadol Scrn, Ur: NEGATIVE ng/mL
pH, Initial: 7.5 pH (ref 4.5–8.9)

## 2012-12-23 LAB — OBSTETRIC PANEL
Basophils Absolute: 0 10*3/uL (ref 0.0–0.1)
Eosinophils Absolute: 0.2 10*3/uL (ref 0.0–0.7)
Hepatitis B Surface Ag: NEGATIVE
Lymphs Abs: 2.2 10*3/uL (ref 0.7–4.0)
MCH: 32.6 pg (ref 26.0–34.0)
Neutrophils Relative %: 67 % (ref 43–77)
Platelets: 315 10*3/uL (ref 150–400)
RBC: 3.56 MIL/uL — ABNORMAL LOW (ref 3.87–5.11)
RDW: 12.9 % (ref 11.5–15.5)
WBC: 8.9 10*3/uL (ref 4.0–10.5)

## 2012-12-23 LAB — CULTURE, OB URINE
Colony Count: NO GROWTH
Organism ID, Bacteria: NO GROWTH

## 2012-12-23 LAB — ALCOHOL METABOLITE (ETG), URINE: Ethyl Glucuronide (EtG): NEGATIVE ng/mL

## 2012-12-24 ENCOUNTER — Encounter: Payer: Self-pay | Admitting: Obstetrics & Gynecology

## 2012-12-24 DIAGNOSIS — O26899 Other specified pregnancy related conditions, unspecified trimester: Secondary | ICD-10-CM | POA: Insufficient documentation

## 2012-12-24 DIAGNOSIS — Z6791 Unspecified blood type, Rh negative: Secondary | ICD-10-CM | POA: Insufficient documentation

## 2012-12-26 LAB — AFP, QUAD SCREEN
AFP: 95.8 IU/mL
Curr Gest Age: 18.1 wks.days
Interpretation-AFP: NEGATIVE
MoM for INH: 1.01
Trisomy 18 (Edward) Syndrome Interp.: 1:31900 {titer}
uE3 Mom: 1.26
uE3 Value: 1.2 ng/mL

## 2012-12-28 ENCOUNTER — Encounter: Payer: Self-pay | Admitting: *Deleted

## 2012-12-29 ENCOUNTER — Encounter: Payer: Self-pay | Admitting: Obstetrics & Gynecology

## 2013-01-05 ENCOUNTER — Ambulatory Visit (HOSPITAL_COMMUNITY)
Admission: RE | Admit: 2013-01-05 | Discharge: 2013-01-05 | Disposition: A | Discharge status: Home / Self Care | Payer: Medicaid Other | Source: Ambulatory Visit | Attending: Obstetrics & Gynecology | Admitting: Obstetrics & Gynecology

## 2013-01-05 DIAGNOSIS — O358XX0 Maternal care for other (suspected) fetal abnormality and damage, not applicable or unspecified: Secondary | ICD-10-CM | POA: Insufficient documentation

## 2013-01-05 DIAGNOSIS — O99330 Smoking (tobacco) complicating pregnancy, unspecified trimester: Secondary | ICD-10-CM | POA: Insufficient documentation

## 2013-01-05 DIAGNOSIS — Z363 Encounter for antenatal screening for malformations: Secondary | ICD-10-CM | POA: Insufficient documentation

## 2013-01-05 DIAGNOSIS — O0932 Supervision of pregnancy with insufficient antenatal care, second trimester: Secondary | ICD-10-CM

## 2013-01-05 DIAGNOSIS — Z1389 Encounter for screening for other disorder: Secondary | ICD-10-CM | POA: Insufficient documentation

## 2013-01-13 ENCOUNTER — Encounter: Payer: Self-pay | Admitting: *Deleted

## 2013-01-16 ENCOUNTER — Encounter: Payer: Self-pay | Admitting: *Deleted

## 2013-01-19 ENCOUNTER — Other Ambulatory Visit (HOSPITAL_COMMUNITY): Payer: Self-pay | Admitting: Family

## 2013-01-19 ENCOUNTER — Ambulatory Visit (INDEPENDENT_AMBULATORY_CARE_PROVIDER_SITE_OTHER): Payer: No Typology Code available for payment source | Admitting: Family

## 2013-01-19 VITALS — BP 121/62 | Temp 97.3°F | Wt 98.3 lb

## 2013-01-19 DIAGNOSIS — O0930 Supervision of pregnancy with insufficient antenatal care, unspecified trimester: Secondary | ICD-10-CM

## 2013-01-19 DIAGNOSIS — O360990 Maternal care for other rhesus isoimmunization, unspecified trimester, not applicable or unspecified: Secondary | ICD-10-CM

## 2013-01-19 DIAGNOSIS — D239 Other benign neoplasm of skin, unspecified: Secondary | ICD-10-CM

## 2013-01-19 DIAGNOSIS — O99330 Smoking (tobacco) complicating pregnancy, unspecified trimester: Secondary | ICD-10-CM

## 2013-01-19 LAB — POCT URINALYSIS DIP (DEVICE)
Bilirubin Urine: NEGATIVE
Ketones, ur: NEGATIVE mg/dL
Protein, ur: NEGATIVE mg/dL
Specific Gravity, Urine: 1.015 (ref 1.005–1.030)
pH: 7 (ref 5.0–8.0)

## 2013-01-19 MED ORDER — NITROFURANTOIN MONOHYD MACRO 100 MG PO CAPS: 100.0000 mg | ORAL_CAPSULE | Freq: Two times a day (BID) | ORAL | Status: DC

## 2013-01-19 NOTE — Progress Notes (Signed)
Pulse- 112 Pressure- "when he kicks" Pt would like something for nausea

## 2013-01-19 NOTE — Progress Notes (Signed)
No questions or concerns; reviewed ultrasound results (nml); schedule follow-up to reassess growth and confirm dating.  Mod leuks in urine, urine culture sent.  RX Macrobid.

## 2013-01-19 NOTE — Progress Notes (Signed)
Pt forgot written RX for Macrobid.  RX called in to [facility]. Left message on pt voicemail.

## 2013-01-24 ENCOUNTER — Ambulatory Visit (HOSPITAL_COMMUNITY): Admission: RE | Admit: 2013-01-24 | Payer: Medicaid Other | Source: Ambulatory Visit

## 2013-01-24 ENCOUNTER — Ambulatory Visit (HOSPITAL_COMMUNITY)
Admission: RE | Admit: 2013-01-24 | Discharge: 2013-01-24 | Disposition: A | Discharge status: Home / Self Care | Payer: Medicaid Other | Source: Ambulatory Visit | Attending: Family | Admitting: Family

## 2013-01-24 DIAGNOSIS — O99330 Smoking (tobacco) complicating pregnancy, unspecified trimester: Secondary | ICD-10-CM | POA: Insufficient documentation

## 2013-01-24 DIAGNOSIS — O9989 Other specified diseases and conditions complicating pregnancy, childbirth and the puerperium: Secondary | ICD-10-CM | POA: Insufficient documentation

## 2013-01-24 DIAGNOSIS — D239 Other benign neoplasm of skin, unspecified: Secondary | ICD-10-CM

## 2013-01-25 ENCOUNTER — Encounter: Payer: Self-pay | Admitting: Family

## 2013-02-10 ENCOUNTER — Encounter: Payer: Self-pay | Admitting: Internal Medicine

## 2013-02-16 ENCOUNTER — Encounter: Payer: Self-pay | Admitting: Family Medicine

## 2013-02-16 ENCOUNTER — Ambulatory Visit (INDEPENDENT_AMBULATORY_CARE_PROVIDER_SITE_OTHER): Payer: Medicaid Other | Admitting: Family Medicine

## 2013-02-16 VITALS — BP 112/78 | Temp 97.1°F | Wt 104.3 lb

## 2013-02-16 DIAGNOSIS — O360990 Maternal care for other rhesus isoimmunization, unspecified trimester, not applicable or unspecified: Secondary | ICD-10-CM

## 2013-02-16 DIAGNOSIS — O360120 Maternal care for anti-D [Rh] antibodies, second trimester, not applicable or unspecified: Secondary | ICD-10-CM

## 2013-02-16 DIAGNOSIS — Z23 Encounter for immunization: Secondary | ICD-10-CM

## 2013-02-16 DIAGNOSIS — O0933 Supervision of pregnancy with insufficient antenatal care, third trimester: Secondary | ICD-10-CM

## 2013-02-16 DIAGNOSIS — O0930 Supervision of pregnancy with insufficient antenatal care, unspecified trimester: Secondary | ICD-10-CM

## 2013-02-16 LAB — POCT URINALYSIS DIP (DEVICE)
Bilirubin Urine: NEGATIVE
Hgb urine dipstick: NEGATIVE
Leukocytes, UA: NEGATIVE
Nitrite: NEGATIVE
Protein, ur: NEGATIVE mg/dL
Urobilinogen, UA: 0.2 mg/dL (ref 0.0–1.0)
pH: 7 (ref 5.0–8.0)

## 2013-02-16 LAB — CBC
HCT: 31.5 % — ABNORMAL LOW (ref 36.0–46.0)
Hemoglobin: 11 g/dL — ABNORMAL LOW (ref 12.0–15.0)
MCV: 97.5 fL (ref 78.0–100.0)
RDW: 13.8 % (ref 11.5–15.5)
WBC: 11.6 10*3/uL — ABNORMAL HIGH (ref 4.0–10.5)

## 2013-02-16 LAB — HIV ANTIBODY (ROUTINE TESTING W REFLEX): HIV: NONREACTIVE

## 2013-02-16 LAB — GLUCOSE TOLERANCE, 1 HOUR (50G) W/O FASTING: Glucose, 1 Hour GTT: 87 mg/dL (ref 70–140)

## 2013-02-16 MED ORDER — TETANUS-DIPHTH-ACELL PERTUSSIS 5-2.5-18.5 LF-MCG/0.5 IM SUSP
0.5000 mL | Freq: Once | INTRAMUSCULAR | Status: AC
  Administered 2013-02-16: 0.5 mL via INTRAMUSCULAR

## 2013-02-16 MED ORDER — RHO D IMMUNE GLOBULIN 1500 UNIT/2ML IJ SOLN
300.0000 ug | Freq: Once | INTRAMUSCULAR | Status: AC
  Administered 2013-02-16: 300 ug via INTRAMUSCULAR

## 2013-02-16 MED ORDER — RHO D IMMUNE GLOBULIN 1500 UNIT/2ML IJ SOLN: 300.0000 ug | Freq: Once | INTRAMUSCULAR | Status: DC

## 2013-02-16 NOTE — Patient Instructions (Signed)
Third Trimester of Pregnancy  The third trimester is from week 29 through week 42, months 7 through 9. The third trimester is a time when the fetus is growing rapidly. At the end of the ninth month, the fetus is about 20 inches in length and weighs 6 10 pounds.   BODY CHANGES  Your body goes through many changes during pregnancy. The changes vary from woman to woman.    Your weight will continue to increase. You can expect to gain 25 35 pounds (11 16 kg) by the end of the pregnancy.   You may begin to get stretch marks on your hips, abdomen, and breasts.   You may urinate more often because the fetus is moving lower into your pelvis and pressing on your bladder.   You may develop or continue to have heartburn as a result of your pregnancy.   You may develop constipation because certain hormones are causing the muscles that push waste through your intestines to slow down.   You may develop hemorrhoids or swollen, bulging veins (varicose veins).   You may have pelvic pain because of the weight gain and pregnancy hormones relaxing your joints between the bones in your pelvis. Back aches may result from over exertion of the muscles supporting your posture.   Your breasts will continue to grow and be tender. A yellow discharge may leak from your breasts called colostrum.   Your belly button may stick out.   You may feel short of breath because of your expanding uterus.   You may notice the fetus "dropping," or moving lower in your abdomen.   You may have a bloody mucus discharge. This usually occurs a few days to a week before labor begins.   Your cervix becomes thin and soft (effaced) near your due date.  WHAT TO EXPECT AT YOUR PRENATAL EXAMS   You will have prenatal exams every 2 weeks until week 36. Then, you will have weekly prenatal exams. During a routine prenatal visit:   You will be weighed to make sure you and the fetus are growing normally.   Your blood pressure is taken.   Your abdomen will be  measured to track your baby's growth.   The fetal heartbeat will be listened to.   Any test results from the previous visit will be discussed.   You may have a cervical check near your due date to see if you have effaced.  At around 36 weeks, your caregiver will check your cervix. At the same time, your caregiver will also perform a test on the secretions of the vaginal tissue. This test is to determine if a type of bacteria, Group B streptococcus, is present. Your caregiver will explain this further.  Your caregiver may ask you:   What your birth plan is.   How you are feeling.   If you are feeling the baby move.   If you have had any abnormal symptoms, such as leaking fluid, bleeding, severe headaches, or abdominal cramping.   If you have any questions.  Other tests or screenings that may be performed during your third trimester include:   Blood tests that check for low iron levels (anemia).   Fetal testing to check the health, activity level, and growth of the fetus. Testing is done if you have certain medical conditions or if there are problems during the pregnancy.  FALSE LABOR  You may feel small, irregular contractions that eventually go away. These are called Braxton Hicks contractions, or   false labor. Contractions may last for hours, days, or even weeks before true labor sets in. If contractions come at regular intervals, intensify, or become painful, it is best to be seen by your caregiver.   SIGNS OF LABOR    Menstrual-like cramps.   Contractions that are 5 minutes apart or less.   Contractions that start on the top of the uterus and spread down to the lower abdomen and back.   A sense of increased pelvic pressure or back pain.   A watery or bloody mucus discharge that comes from the vagina.  If you have any of these signs before the 37th week of pregnancy, call your caregiver right away. You need to go to the hospital to get checked immediately.  HOME CARE INSTRUCTIONS    Avoid all  smoking, herbs, alcohol, and unprescribed drugs. These chemicals affect the formation and growth of the baby.   Follow your caregiver's instructions regarding medicine use. There are medicines that are either safe or unsafe to take during pregnancy.   Exercise only as directed by your caregiver. Experiencing uterine cramps is a good sign to stop exercising.   Continue to eat regular, healthy meals.   Wear a good support bra for breast tenderness.   Do not use hot tubs, steam rooms, or saunas.   Wear your seat belt at all times when driving.   Avoid raw meat, uncooked cheese, cat litter boxes, and soil used by cats. These carry germs that can cause birth defects in the baby.   Take your prenatal vitamins.   Try taking a stool softener (if your caregiver approves) if you develop constipation. Eat more high-fiber foods, such as fresh vegetables or fruit and whole grains. Drink plenty of fluids to keep your urine clear or pale yellow.   Take warm sitz baths to soothe any pain or discomfort caused by hemorrhoids. Use hemorrhoid cream if your caregiver approves.   If you develop varicose veins, wear support hose. Elevate your feet for 15 minutes, 3 4 times a day. Limit salt in your diet.   Avoid heavy lifting, wear low heal shoes, and practice good posture.   Rest a lot with your legs elevated if you have leg cramps or low back pain.   Visit your dentist if you have not gone during your pregnancy. Use a soft toothbrush to brush your teeth and be gentle when you floss.   A sexual relationship may be continued unless your caregiver directs you otherwise.   Do not travel far distances unless it is absolutely necessary and only with the approval of your caregiver.   Take prenatal classes to understand, practice, and ask questions about the labor and delivery.   Make a trial run to the hospital.   Pack your hospital bag.   Prepare the baby's nursery.   Continue to go to all your prenatal visits as directed  by your caregiver.  SEEK MEDICAL CARE IF:   You are unsure if you are in labor or if your water has broken.   You have dizziness.   You have mild pelvic cramps, pelvic pressure, or nagging pain in your abdominal area.   You have persistent nausea, vomiting, or diarrhea.   You have a bad smelling vaginal discharge.   You have pain with urination.  SEEK IMMEDIATE MEDICAL CARE IF:    You have a fever.   You are leaking fluid from your vagina.   You have spotting or bleeding from your vagina.     You have severe abdominal cramping or pain.   You have rapid weight loss or gain.   You have shortness of breath with chest pain.   You notice sudden or extreme swelling of your face, hands, ankles, feet, or legs.   You have not felt your baby move in over an hour.   You have severe headaches that do not go away with medicine.   You have vision changes.  Document Released: 02/10/2001 Document Revised: 10/19/2012 Document Reviewed: 04/19/2012  [employer] Patient Information 2014 [employer].

## 2013-02-16 NOTE — Progress Notes (Signed)
Pulse: 91

## 2013-02-16 NOTE — Addendum Note (Signed)
Addended by: Franchot Mimes on: 02/16/2013 09:40 AM   Modules accepted: Orders

## 2013-02-16 NOTE — Progress Notes (Signed)
+  FM no lof no vb, no ctx  Melissa Walker is a 32 y.o. G2P1001 at [redacted]w[redacted]d by redated [place] 27wk here for ROB visit.  Discussed with Patient:  -Plans to bottle feed.  All questions answered. -Continue prenatal vitamins. - Reviewed genetics screen -Reviewed fetal kick counts (Pt to perform daily at a time when the baby is active, lie laterally with both hands on belly in quiet room and count all movements (hiccups, shoulder rolls, obvious kicks, etc); pt is to report to clinic or MAU for less than 10 movements felt in a one hour time period-pt told as soon as she counts 10 movements the count is complete.)  - Routine precautions discussed (depression, infection s/s).   Patient provided with all pertinent phone numbers for emergencies. - RTC for any VB, regular, painful cramps/ctxs occurring at a rate of >2/10 min, fever (100.5 or higher), n/v/d, any pain that is unresolving or worsening, LOF, decreased fetal movement, CP, SOB, edema  Problems: Patient Active Problem List   Diagnosis Date Noted  . Rh negative, antepartum 12/24/2012  . Late prenatal care 12/22/2012  . Smoking complicating pregnancy, antepartum 12/22/2012  . 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 Superficial bruising of lower leg 03/10/2012  . Abnormal weight loss 02/18/2012  . Radiculopathy, lumbar region 11/27/2010  . SKIN LESION 09/14/2008  . LOW BACK PAIN, CHRONIC 04/13/2008  . OTHER DISORDER OF COCCYX 10/26/2007  . ACNE, MILD 06/29/2007  . NECK MASS 05/25/2007  . MOLE 03/12/2006  . TOBACCO ABUSE 02/02/2006    To Do: 1. Glucose tolerance test ordered.  Patient will draw in clinic.  Will f/u test and amend plan based on results. 2. CBC RPR and antibody screen ordered. 3. Rhogam given (if Rh-)  [ ]  Vaccines: Flu: recd  Tdap: today [ ]  BCM: desires BTL  Edu: [ x] PTL precautions; [ ]  BF class; [ ]  childbirth class; [ ]   BF counseling;

## 2013-03-02 NOTE — L&D Delivery Note (Signed)
Delivery Note At 7:02 PM a viable female was delivered via Vaginal, Spontaneous Delivery (Presentation: Right Occiput Anterior).  APGAR: 9, 9; weight 7 lb 2.8 oz (3255 g).   Placenta status: Intact, Spontaneous.  Cord: 3 vessels with the following complications: None.  Cord pH: not collected  Anesthesia: None  Episiotomy: None Lacerations: 1st degree Suture Repair: 3.0 vicryl Est. Blood Loss (mL): 400  Mom to postpartum.  Baby to Couplet care / Skin to Skin.  Ms. Macfarlane presented in active labor. Viable baby boy SVD. Uncomplicated. Dr. Glo Herring was present and supervised entire procedure. He assisted with laceration repair. Hemostatic. Counts correct x2.   [name] 05/10/2013, 9:07 PM

## 2013-03-02 NOTE — L&D Delivery Note (Signed)
`````  Attestation of Attending Supervision of Advanced Practitioner: Evaluation and management procedures were performed by the PA/NP/CNM/OB Fellow under my supervision/collaboration. Chart reviewed and agree with management and plan.  Alandra Sando V 05/12/2013 6:42 PM

## 2013-03-09 ENCOUNTER — Ambulatory Visit (INDEPENDENT_AMBULATORY_CARE_PROVIDER_SITE_OTHER): Payer: Medicaid Other | Admitting: Obstetrics & Gynecology

## 2013-03-09 VITALS — BP 118/80 | Wt 106.9 lb

## 2013-03-09 DIAGNOSIS — O99333 Smoking (tobacco) complicating pregnancy, third trimester: Secondary | ICD-10-CM

## 2013-03-09 DIAGNOSIS — Z302 Encounter for sterilization: Secondary | ICD-10-CM

## 2013-03-09 DIAGNOSIS — O99330 Smoking (tobacco) complicating pregnancy, unspecified trimester: Secondary | ICD-10-CM

## 2013-03-09 DIAGNOSIS — O360990 Maternal care for other rhesus isoimmunization, unspecified trimester, not applicable or unspecified: Secondary | ICD-10-CM

## 2013-03-09 DIAGNOSIS — Z6791 Unspecified blood type, Rh negative: Secondary | ICD-10-CM

## 2013-03-09 DIAGNOSIS — O0930 Supervision of pregnancy with insufficient antenatal care, unspecified trimester: Secondary | ICD-10-CM

## 2013-03-09 DIAGNOSIS — O0933 Supervision of pregnancy with insufficient antenatal care, third trimester: Secondary | ICD-10-CM

## 2013-03-09 DIAGNOSIS — O26899 Other specified pregnancy related conditions, unspecified trimester: Secondary | ICD-10-CM

## 2013-03-09 LAB — POCT URINALYSIS DIP (DEVICE)
Bilirubin Urine: NEGATIVE
GLUCOSE, UA: NEGATIVE mg/dL
HGB URINE DIPSTICK: NEGATIVE
KETONES UR: NEGATIVE mg/dL
Nitrite: NEGATIVE
Protein, ur: NEGATIVE mg/dL
SPECIFIC GRAVITY, URINE: 1.01 (ref 1.005–1.030)
Urobilinogen, UA: 0.2 mg/dL (ref 0.0–1.0)
pH: 6 (ref 5.0–8.0)

## 2013-03-09 MED ORDER — PRENATAL VITAMINS PLUS 27-1 MG PO TABS: 1.0000 | ORAL_TABLET | Freq: Every day | ORAL | Status: DC

## 2013-03-09 NOTE — Progress Notes (Signed)
PNV refilled.  Tubal papers signed.  No other complaints or concerns.  Fetal movement and labor precautions reviewed.

## 2013-03-09 NOTE — Progress Notes (Signed)
Pulse: 92 Patient needs a refill on her pnv

## 2013-03-09 NOTE — Patient Instructions (Signed)
Return to clinic for any obstetric concerns or go to MAU for evaluation  

## 2013-03-22 ENCOUNTER — Encounter: Payer: Self-pay | Admitting: *Deleted

## 2013-03-23 ENCOUNTER — Encounter: Payer: Medicaid Other | Admitting: Obstetrics & Gynecology

## 2013-03-30 ENCOUNTER — Ambulatory Visit (INDEPENDENT_AMBULATORY_CARE_PROVIDER_SITE_OTHER): Payer: Medicaid Other | Admitting: Obstetrics & Gynecology

## 2013-03-30 VITALS — BP 125/81 | Wt 113.8 lb

## 2013-03-30 DIAGNOSIS — O0930 Supervision of pregnancy with insufficient antenatal care, unspecified trimester: Secondary | ICD-10-CM

## 2013-03-30 DIAGNOSIS — O99330 Smoking (tobacco) complicating pregnancy, unspecified trimester: Secondary | ICD-10-CM

## 2013-03-30 DIAGNOSIS — Z6791 Unspecified blood type, Rh negative: Secondary | ICD-10-CM

## 2013-03-30 DIAGNOSIS — Z302 Encounter for sterilization: Secondary | ICD-10-CM

## 2013-03-30 DIAGNOSIS — O360990 Maternal care for other rhesus isoimmunization, unspecified trimester, not applicable or unspecified: Secondary | ICD-10-CM

## 2013-03-30 DIAGNOSIS — O26899 Other specified pregnancy related conditions, unspecified trimester: Secondary | ICD-10-CM

## 2013-03-30 LAB — POCT URINALYSIS DIP (DEVICE)
BILIRUBIN URINE: NEGATIVE
GLUCOSE, UA: NEGATIVE mg/dL
HGB URINE DIPSTICK: NEGATIVE
KETONES UR: NEGATIVE mg/dL
Nitrite: NEGATIVE
PH: 6 (ref 5.0–8.0)
Protein, ur: NEGATIVE mg/dL
Urobilinogen, UA: 0.2 mg/dL (ref 0.0–1.0)

## 2013-03-30 NOTE — Patient Instructions (Signed)
Return to clinic for any obstetric concerns or go to MAU for evaluation  

## 2013-03-30 NOTE — Progress Notes (Signed)
RUQ pain related to fetal movement, normal BP.  Will continue to monitor.  Pelvic cultures next visit. No other complaints or concerns.  Fetal movement and labor precautions reviewed.

## 2013-03-30 NOTE — Progress Notes (Signed)
Pulse- 108 Patient reports RUQ pain that is intermittent for past few days

## 2013-04-06 ENCOUNTER — Encounter: Payer: Self-pay | Admitting: Family Medicine

## 2013-04-06 ENCOUNTER — Ambulatory Visit (INDEPENDENT_AMBULATORY_CARE_PROVIDER_SITE_OTHER): Payer: Medicaid Other | Admitting: Obstetrics and Gynecology

## 2013-04-06 VITALS — BP 128/82 | Temp 96.6°F | Wt 115.1 lb

## 2013-04-06 DIAGNOSIS — O99330 Smoking (tobacco) complicating pregnancy, unspecified trimester: Secondary | ICD-10-CM

## 2013-04-06 DIAGNOSIS — Z302 Encounter for sterilization: Secondary | ICD-10-CM

## 2013-04-06 DIAGNOSIS — O360990 Maternal care for other rhesus isoimmunization, unspecified trimester, not applicable or unspecified: Secondary | ICD-10-CM

## 2013-04-06 DIAGNOSIS — O0930 Supervision of pregnancy with insufficient antenatal care, unspecified trimester: Secondary | ICD-10-CM

## 2013-04-06 DIAGNOSIS — O26899 Other specified pregnancy related conditions, unspecified trimester: Principal | ICD-10-CM

## 2013-04-06 DIAGNOSIS — Z6791 Unspecified blood type, Rh negative: Secondary | ICD-10-CM

## 2013-04-06 LAB — OB RESULTS CONSOLE GBS: STREP GROUP B AG: NEGATIVE

## 2013-04-06 LAB — POCT URINALYSIS DIP (DEVICE)
Bilirubin Urine: NEGATIVE
GLUCOSE, UA: NEGATIVE mg/dL
Hgb urine dipstick: NEGATIVE
KETONES UR: NEGATIVE mg/dL
Nitrite: NEGATIVE
PROTEIN: NEGATIVE mg/dL
SPECIFIC GRAVITY, URINE: 1.01 (ref 1.005–1.030)
Urobilinogen, UA: 0.2 mg/dL (ref 0.0–1.0)
pH: 6 (ref 5.0–8.0)

## 2013-04-06 NOTE — Progress Notes (Signed)
Patient is doing well without complaints. FM/labor precautions reviewed. Cultures collected 

## 2013-04-06 NOTE — Progress Notes (Signed)
Pulse: 83

## 2013-04-07 LAB — GC/CHLAMYDIA PROBE AMP
CT PROBE, AMP APTIMA: NEGATIVE
GC Probe RNA: NEGATIVE

## 2013-04-08 LAB — CULTURE, BETA STREP (GROUP B ONLY)

## 2013-04-10 ENCOUNTER — Encounter: Payer: Self-pay | Admitting: Obstetrics and Gynecology

## 2013-04-13 ENCOUNTER — Ambulatory Visit (INDEPENDENT_AMBULATORY_CARE_PROVIDER_SITE_OTHER): Payer: Medicaid Other | Admitting: Obstetrics & Gynecology

## 2013-04-13 ENCOUNTER — Encounter: Payer: Self-pay | Admitting: Obstetrics & Gynecology

## 2013-04-13 VITALS — BP 118/72 | Temp 97.3°F | Wt 115.9 lb

## 2013-04-13 DIAGNOSIS — O99330 Smoking (tobacco) complicating pregnancy, unspecified trimester: Secondary | ICD-10-CM

## 2013-04-13 DIAGNOSIS — O360990 Maternal care for other rhesus isoimmunization, unspecified trimester, not applicable or unspecified: Secondary | ICD-10-CM

## 2013-04-13 DIAGNOSIS — Z6791 Unspecified blood type, Rh negative: Secondary | ICD-10-CM

## 2013-04-13 DIAGNOSIS — O0930 Supervision of pregnancy with insufficient antenatal care, unspecified trimester: Secondary | ICD-10-CM

## 2013-04-13 DIAGNOSIS — O26899 Other specified pregnancy related conditions, unspecified trimester: Secondary | ICD-10-CM

## 2013-04-13 LAB — POCT URINALYSIS DIP (DEVICE)
BILIRUBIN URINE: NEGATIVE
Glucose, UA: NEGATIVE mg/dL
HGB URINE DIPSTICK: NEGATIVE
Ketones, ur: NEGATIVE mg/dL
NITRITE: NEGATIVE
Protein, ur: NEGATIVE mg/dL
SPECIFIC GRAVITY, URINE: 1.01 (ref 1.005–1.030)
UROBILINOGEN UA: 0.2 mg/dL (ref 0.0–1.0)
pH: 7 (ref 5.0–8.0)

## 2013-04-13 NOTE — Progress Notes (Signed)
P= 91 Edema in ankles.  Intermittent lower abdominal/pelvic pressure.

## 2013-04-13 NOTE — Progress Notes (Signed)
No complaints, requested cx exam.

## 2013-04-13 NOTE — Patient Instructions (Signed)

## 2013-04-20 ENCOUNTER — Ambulatory Visit (INDEPENDENT_AMBULATORY_CARE_PROVIDER_SITE_OTHER): Payer: Medicaid Other | Admitting: Family Medicine

## 2013-04-20 ENCOUNTER — Encounter: Payer: Self-pay | Admitting: Family Medicine

## 2013-04-20 VITALS — BP 130/86 | Temp 98.0°F | Wt 116.9 lb

## 2013-04-20 DIAGNOSIS — O99330 Smoking (tobacco) complicating pregnancy, unspecified trimester: Secondary | ICD-10-CM

## 2013-04-20 DIAGNOSIS — O0930 Supervision of pregnancy with insufficient antenatal care, unspecified trimester: Secondary | ICD-10-CM

## 2013-04-20 LAB — POCT URINALYSIS DIP (DEVICE)
Bilirubin Urine: NEGATIVE
GLUCOSE, UA: NEGATIVE mg/dL
Hgb urine dipstick: NEGATIVE
Ketones, ur: NEGATIVE mg/dL
NITRITE: NEGATIVE
PROTEIN: NEGATIVE mg/dL
SPECIFIC GRAVITY, URINE: 1.01 (ref 1.005–1.030)
UROBILINOGEN UA: 0.2 mg/dL (ref 0.0–1.0)
pH: 6 (ref 5.0–8.0)

## 2013-04-20 NOTE — Progress Notes (Signed)
Pulse- 101 

## 2013-04-20 NOTE — Progress Notes (Signed)
Doing well--labor precautions. 

## 2013-04-20 NOTE — Patient Instructions (Signed)
Third Trimester of Pregnancy The third trimester is from week 29 through week 42, months 7 through 9. The third trimester is a time when the fetus is growing rapidly. At the end of the ninth month, the fetus is about 20 inches in length and weighs 6 10 pounds.  BODY CHANGES Your body goes through many changes during pregnancy. The changes vary from woman to woman.   Your weight will continue to increase. You can expect to gain 25 35 pounds (11 16 kg) by the end of the pregnancy.  You may begin to get stretch marks on your hips, abdomen, and breasts.  You may urinate more often because the fetus is moving lower into your pelvis and pressing on your bladder.  You may develop or continue to have heartburn as a result of your pregnancy.  You may develop constipation because certain hormones are causing the muscles that push waste through your intestines to slow down.  You may develop hemorrhoids or swollen, bulging veins (varicose veins).  You may have pelvic pain because of the weight gain and pregnancy hormones relaxing your joints between the bones in your pelvis. Back aches may result from over exertion of the muscles supporting your posture.  Your breasts will continue to grow and be tender. A yellow discharge may leak from your breasts called colostrum.  Your belly button may stick out.  You may feel short of breath because of your expanding uterus.  You may notice the fetus "dropping," or moving lower in your abdomen.  You may have a bloody mucus discharge. This usually occurs a few days to a week before labor begins.  Your cervix becomes thin and soft (effaced) near your due date. WHAT TO EXPECT AT YOUR PRENATAL EXAMS  You will have prenatal exams every 2 weeks until week 36. Then, you will have weekly prenatal exams. During a routine prenatal visit:  You will be weighed to make sure you and the fetus are growing normally.  Your blood pressure is taken.  Your abdomen will  be measured to track your baby's growth.  The fetal heartbeat will be listened to.  Any test results from the previous visit will be discussed.  You may have a cervical check near your due date to see if you have effaced. At around 36 weeks, your caregiver will check your cervix. At the same time, your caregiver will also perform a test on the secretions of the vaginal tissue. This test is to determine if a type of bacteria, Group B streptococcus, is present. Your caregiver will explain this further. Your caregiver may ask you:  What your birth plan is.  How you are feeling.  If you are feeling the baby move.  If you have had any abnormal symptoms, such as leaking fluid, bleeding, severe headaches, or abdominal cramping.  If you have any questions. Other tests or screenings that may be performed during your third trimester include:  Blood tests that check for low iron levels (anemia).  Fetal testing to check the health, activity level, and growth of the fetus. Testing is done if you have certain medical conditions or if there are problems during the pregnancy. FALSE LABOR You may feel small, irregular contractions that eventually go away. These are called Braxton Hicks contractions, or false labor. Contractions may last for hours, days, or even weeks before true labor sets in. If contractions come at regular intervals, intensify, or become painful, it is best to be seen by your caregiver.    SIGNS OF LABOR   Menstrual-like cramps.  Contractions that are 5 minutes apart or less.  Contractions that start on the top of the uterus and spread down to the lower abdomen and back.  A sense of increased pelvic pressure or back pain.  A watery or bloody mucus discharge that comes from the vagina. If you have any of these signs before the 37th week of pregnancy, call your caregiver right away. You need to go to the hospital to get checked immediately. HOME CARE INSTRUCTIONS   Avoid all  smoking, herbs, alcohol, and unprescribed drugs. These chemicals affect the formation and growth of the baby.  Follow your caregiver's instructions regarding medicine use. There are medicines that are either safe or unsafe to take during pregnancy.  Exercise only as directed by your caregiver. Experiencing uterine cramps is a good sign to stop exercising.  Continue to eat regular, healthy meals.  Wear a good support bra for breast tenderness.  Do not use hot tubs, steam rooms, or saunas.  Wear your seat belt at all times when driving.  Avoid raw meat, uncooked cheese, cat litter boxes, and soil used by cats. These carry germs that can cause birth defects in the baby.  Take your prenatal vitamins.  Try taking a stool softener (if your caregiver approves) if you develop constipation. Eat more high-fiber foods, such as fresh vegetables or fruit and whole grains. Drink plenty of fluids to keep your urine clear or pale yellow.  Take warm sitz baths to soothe any pain or discomfort caused by hemorrhoids. Use hemorrhoid cream if your caregiver approves.  If you develop varicose veins, wear support hose. Elevate your feet for 15 minutes, 3 4 times a day. Limit salt in your diet.  Avoid heavy lifting, wear low heal shoes, and practice good posture.  Rest a lot with your legs elevated if you have leg cramps or low back pain.  Visit your dentist if you have not gone during your pregnancy. Use a soft toothbrush to brush your teeth and be gentle when you floss.  A sexual relationship may be continued unless your caregiver directs you otherwise.  Do not travel far distances unless it is absolutely necessary and only with the approval of your caregiver.  Take prenatal classes to understand, practice, and ask questions about the labor and delivery.  Make a trial run to the hospital.  Pack your hospital bag.  Prepare the baby's nursery.  Continue to go to all your prenatal visits as directed  by your caregiver. SEEK MEDICAL CARE IF:  You are unsure if you are in labor or if your water has broken.  You have dizziness.  You have mild pelvic cramps, pelvic pressure, or nagging pain in your abdominal area.  You have persistent nausea, vomiting, or diarrhea.  You have a bad smelling vaginal discharge.  You have pain with urination. SEEK IMMEDIATE MEDICAL CARE IF:   You have a fever.  You are leaking fluid from your vagina.  You have spotting or bleeding from your vagina.  You have severe abdominal cramping or pain.  You have rapid weight loss or gain.  You have shortness of breath with chest pain.  You notice sudden or extreme swelling of your face, hands, ankles, feet, or legs.  You have not felt your baby move in over an hour.  You have severe headaches that do not go away with medicine.  You have vision changes. Document Released: 02/10/2001 Document Revised: 10/19/2012 Document Reviewed:   04/19/2012 [employer] Patient Information 2014 [employer].  Breastfeeding Deciding to breastfeed is one of the best choices you can make for you and your baby. A change in hormones during pregnancy causes your breast tissue to grow and increases the number and size of your milk ducts. These hormones also allow proteins, sugars, and fats from your blood supply to make breast milk in your milk-producing glands. Hormones prevent breast milk from being released before your baby is born as well as prompt milk flow after birth. Once breastfeeding has begun, thoughts of your baby, as well as his or her sucking or crying, can stimulate the release of milk from your milk-producing glands.  BENEFITS OF BREASTFEEDING For Your Baby  Your first milk (colostrum) helps your baby's digestive system function better.   There are antibodies in your milk that help your baby fight off infections.   Your baby has a lower incidence of asthma, allergies, and sudden infant death syndrome.    The nutrients in breast milk are better for your baby than infant formulas and are designed uniquely for your baby's needs.   Breast milk improves your baby's brain development.   Your baby is less likely to develop other conditions, such as childhood obesity, asthma, or type 2 diabetes mellitus.  For You   Breastfeeding helps to create a very special bond between you and your baby.   Breastfeeding is convenient. Breast milk is always available at the correct temperature and costs nothing.   Breastfeeding helps to burn calories and helps you lose the weight gained during pregnancy.   Breastfeeding makes your uterus contract to its prepregnancy size faster and slows bleeding (lochia) after you give birth.   Breastfeeding helps to lower your risk of developing type 2 diabetes mellitus, osteoporosis, and breast or ovarian cancer later in life. SIGNS THAT YOUR BABY IS HUNGRY Early Signs of Hunger  Increased alertness or activity.  Stretching.  Movement of the head from side to side.  Movement of the head and opening of the mouth when the corner of the mouth or cheek is stroked (rooting).  Increased sucking sounds, smacking lips, cooing, sighing, or squeaking.  Hand-to-mouth movements.  Increased sucking of fingers or hands. Late Signs of Hunger  Fussing.  Intermittent crying. Extreme Signs of Hunger Signs of extreme hunger will require calming and consoling before your baby will be able to breastfeed successfully. Do not wait for the following signs of extreme hunger to occur before you initiate breastfeeding:   Restlessness.  A loud, strong cry.   Screaming. BREASTFEEDING BASICS Breastfeeding Initiation  Find a comfortable place to sit or lie down, with your neck and back well supported.  Place a pillow or rolled up blanket under your baby to bring him or her to the level of your breast (if you are seated). Nursing pillows are specially designed to help  support your arms and your baby while you breastfeed.  Make sure that your baby's abdomen is facing your abdomen.   Gently massage your breast. With your fingertips, massage from your chest wall toward your nipple in a circular motion. This encourages milk flow. You may need to continue this action during the feeding if your milk flows slowly.  Support your breast with 4 fingers underneath and your thumb above your nipple. Make sure your fingers are well away from your nipple and your baby's mouth.   Stroke your baby's lips gently with your finger or nipple.   When your baby's mouth is   open wide enough, quickly bring your baby to your breast, placing your entire nipple and as much of the colored area around your nipple (areola) as possible into your baby's mouth.   More areola should be visible above your baby's upper lip than below the lower lip.   Your baby's tongue should be between his or her lower gum and your breast.   Ensure that your baby's mouth is correctly positioned around your nipple (latched). Your baby's lips should create a seal on your breast and be turned out (everted).  It is common for your baby to suck about 2 3 minutes in order to start the flow of breast milk. Latching Teaching your baby how to latch on to your breast properly is very important. An improper latch can cause nipple pain and decreased milk supply for you and poor weight gain in your baby. Also, if your baby is not latched onto your nipple properly, he or she may swallow some air during feeding. This can make your baby fussy. Burping your baby when you switch breasts during the feeding can help to get rid of the air. However, teaching your baby to latch on properly is still the best way to prevent fussiness from swallowing air while breastfeeding. Signs that your baby has successfully latched on to your nipple:    Silent tugging or silent sucking, without causing you pain.   Swallowing heard  between every 3 4 sucks.    Muscle movement above and in front of his or her ears while sucking.  Signs that your baby has not successfully latched on to nipple:   Sucking sounds or smacking sounds from your baby while breastfeeding.  Nipple pain. If you think your baby has not latched on correctly, slip your finger into the corner of your baby's mouth to break the suction and place it between your baby's gums. Attempt breastfeeding initiation again. Signs of Successful Breastfeeding Signs from your baby:   A gradual decrease in the number of sucks or complete cessation of sucking.   Falling asleep.   Relaxation of his or her body.   Retention of a small amount of milk in his or her mouth.   Letting go of your breast by himself or herself. Signs from you:  Breasts that have increased in firmness, weight, and size 1 3 hours after feeding.   Breasts that are softer immediately after breastfeeding.  Increased milk volume, as well as a change in milk consistency and color by the 5th day of breastfeeding.   Nipples that are not sore, cracked, or bleeding. Signs That Your Baby is Getting Enough Milk  Wetting at least 3 diapers in a 24-hour period. The urine should be clear and pale yellow by age 5 days.  At least 3 stools in a 24-hour period by age 5 days. The stool should be soft and yellow.  At least 3 stools in a 24-hour period by age 7 days. The stool should be seedy and yellow.  No loss of weight greater than 10% of birth weight during the first 3 days of age.  Average weight gain of 4 7 ounces (120 210 mL) per week after age 4 days.  Consistent daily weight gain by age 5 days, without weight loss after the age of 2 weeks. After a feeding, your baby may spit up a small amount. This is common. BREASTFEEDING FREQUENCY AND DURATION Frequent feeding will help you make more milk and can prevent sore nipples and breast engorgement.   Breastfeed when you feel the need to  reduce the fullness of your breasts or when your baby shows signs of hunger. This is called "breastfeeding on demand." Avoid introducing a pacifier to your baby while you are working to establish breastfeeding (the first 4 6 weeks after your baby is born). After this time you may choose to use a pacifier. Research has shown that pacifier use during the first year of a baby's life decreases the risk of sudden infant death syndrome (SIDS). Allow your baby to feed on each breast as long as he or she wants. Breastfeed until your baby is finished feeding. When your baby unlatches or falls asleep while feeding from the first breast, offer the second breast. Because newborns are often sleepy in the first few weeks of life, you may need to awaken your baby to get him or her to feed. Breastfeeding times will vary from baby to baby. However, the following rules can serve as a guide to help you ensure that your baby is properly fed:  Newborns (babies 4 weeks of age or younger) may breastfeed every 1 3 hours.  Newborns should not go longer than 3 hours during the day or 5 hours during the night without breastfeeding.  You should breastfeed your baby a minimum of 8 times in a 24-hour period until you begin to introduce solid foods to your baby at around 6 months of age. BREAST MILK PUMPING Pumping and storing breast milk allows you to ensure that your baby is exclusively fed your breast milk, even at times when you are unable to breastfeed. This is especially important if you are going back to work while you are still breastfeeding or when you are not able to be present during feedings. Your lactation consultant can give you guidelines on how long it is safe to store breast milk.  A breast pump is a machine that allows you to pump milk from your breast into a sterile bottle. The pumped breast milk can then be stored in a refrigerator or freezer. Some breast pumps are operated by hand, while others use electricity. Ask  your lactation consultant which type will work best for you. Breast pumps can be purchased, but some hospitals and breastfeeding support groups lease breast pumps on a monthly basis. A lactation consultant can teach you how to hand express breast milk, if you prefer not to use a pump.  CARING FOR YOUR BREASTS WHILE YOU BREASTFEED Nipples can become dry, cracked, and sore while breastfeeding. The following recommendations can help keep your breasts moisturized and healthy:  Avoid using soap on your nipples.   Wear a supportive bra. Although not required, special nursing bras and tank tops are designed to allow access to your breasts for breastfeeding without taking off your entire bra or top. Avoid wearing underwire style bras or extremely tight bras.  Air dry your nipples for 3 4minutes after each feeding.   Use only cotton bra pads to absorb leaked breast milk. Leaking of breast milk between feedings is normal.   Use lanolin on your nipples after breastfeeding. Lanolin helps to maintain your skin's normal moisture barrier. If you use pure lanolin you do not need to wash it off before feeding your baby again. Pure lanolin is not toxic to your baby. You may also hand express a few drops of breast milk and gently massage that milk into your nipples and allow the milk to air dry. In the first few weeks after giving birth, some women   experience extremely full breasts (engorgement). Engorgement can make your breasts feel heavy, warm, and tender to the touch. Engorgement peaks within 3 5 days after you give birth. The following recommendations can help ease engorgement:  Completely empty your breasts while breastfeeding or pumping. You may want to start by applying warm, moist heat (in the shower or with warm water-soaked hand towels) just before feeding or pumping. This increases circulation and helps the milk flow. If your baby does not completely empty your breasts while breastfeeding, pump any extra  milk after he or she is finished.  Wear a snug bra (nursing or regular) or tank top for 1 2 days to signal your body to slightly decrease milk production.  Apply ice packs to your breasts, unless this is too uncomfortable for you.  Make sure that your baby is latched on and positioned properly while breastfeeding. If engorgement persists after 48 hours of following these recommendations, contact your health care provider or a lactation consultant. OVERALL HEALTH CARE RECOMMENDATIONS WHILE BREASTFEEDING  Eat healthy foods. Alternate between meals and snacks, eating 3 of each per day. Because what you eat affects your breast milk, some of the foods may make your baby more irritable than usual. Avoid eating these foods if you are sure that they are negatively affecting your baby.  Drink milk, fruit juice, and water to satisfy your thirst (about 10 glasses a day).   Rest often, relax, and continue to take your prenatal vitamins to prevent fatigue, stress, and anemia.  Continue breast self-awareness checks.  Avoid chewing and smoking tobacco.  Avoid alcohol and drug use. Some medicines that may be harmful to your baby can pass through breast milk. It is important to ask your health care provider before taking any medicine, including all over-the-counter and prescription medicine as well as vitamin and herbal supplements. It is possible to become pregnant while breastfeeding. If birth control is desired, ask your health care provider about options that will be safe for your baby. SEEK MEDICAL CARE IF:   You feel like you want to stop breastfeeding or have become frustrated with breastfeeding.  You have painful breasts or nipples.  Your nipples are cracked or bleeding.  Your breasts are red, tender, or warm.  You have a swollen area on either breast.  You have a fever or chills.  You have nausea or vomiting.  You have drainage other than breast milk from your nipples.  Your breasts  do not become full before feedings by the 5th day after you give birth.  You feel sad and depressed.  Your baby is too sleepy to eat well.  Your baby is having trouble sleeping.   Your baby is wetting less than 3 diapers in a 24-hour period.  Your baby has less than 3 stools in a 24-hour period.  Your baby's skin or the white part of his or her eyes becomes yellow.   Your baby is not gaining weight by 5 days of age. SEEK IMMEDIATE MEDICAL CARE IF:   Your baby is overly tired (lethargic) and does not want to wake up and feed.  Your baby develops an unexplained fever. Document Released: 02/16/2005 Document Revised: 10/19/2012 Document Reviewed: 08/10/2012 [employer] Patient Information 2014 [employer].  

## 2013-04-27 ENCOUNTER — Encounter: Payer: Medicaid Other | Admitting: Obstetrics & Gynecology

## 2013-05-04 ENCOUNTER — Encounter: Payer: Self-pay | Admitting: Obstetrics and Gynecology

## 2013-05-04 ENCOUNTER — Ambulatory Visit (INDEPENDENT_AMBULATORY_CARE_PROVIDER_SITE_OTHER): Payer: Medicaid Other | Admitting: Obstetrics and Gynecology

## 2013-05-04 ENCOUNTER — Telehealth (HOSPITAL_COMMUNITY): Payer: Self-pay | Admitting: *Deleted

## 2013-05-04 VITALS — BP 138/87 | Wt 121.2 lb

## 2013-05-04 DIAGNOSIS — Z302 Encounter for sterilization: Secondary | ICD-10-CM

## 2013-05-04 DIAGNOSIS — O26899 Other specified pregnancy related conditions, unspecified trimester: Secondary | ICD-10-CM

## 2013-05-04 DIAGNOSIS — O360990 Maternal care for other rhesus isoimmunization, unspecified trimester, not applicable or unspecified: Secondary | ICD-10-CM

## 2013-05-04 DIAGNOSIS — O99330 Smoking (tobacco) complicating pregnancy, unspecified trimester: Secondary | ICD-10-CM

## 2013-05-04 DIAGNOSIS — Z6791 Unspecified blood type, Rh negative: Secondary | ICD-10-CM

## 2013-05-04 DIAGNOSIS — O0930 Supervision of pregnancy with insufficient antenatal care, unspecified trimester: Secondary | ICD-10-CM

## 2013-05-04 LAB — POCT URINALYSIS DIP (DEVICE)
GLUCOSE, UA: NEGATIVE mg/dL
HGB URINE DIPSTICK: NEGATIVE
Ketones, ur: NEGATIVE mg/dL
Nitrite: NEGATIVE
Protein, ur: 30 mg/dL — AB
Specific Gravity, Urine: >=1.03 (ref 1.005–1.030)
UROBILINOGEN UA: 0.2 mg/dL (ref 0.0–1.0)
pH: 6 (ref 5.0–8.0)

## 2013-05-04 NOTE — Progress Notes (Signed)
IOL scheduled 05/11/13 at 730 pm.

## 2013-05-04 NOTE — Progress Notes (Signed)
Patient is doing well without complaints. FM/labor precautions reviewed. Will schedule IOL on 3/12. Will schedule postdate testing for early next week

## 2013-05-04 NOTE — Telephone Encounter (Signed)
Preadmission screen  

## 2013-05-04 NOTE — Progress Notes (Signed)
Pulse: 102

## 2013-05-08 ENCOUNTER — Ambulatory Visit (INDEPENDENT_AMBULATORY_CARE_PROVIDER_SITE_OTHER): Payer: Medicaid Other | Admitting: *Deleted

## 2013-05-08 VITALS — BP 141/90

## 2013-05-08 DIAGNOSIS — O480 Post-term pregnancy: Secondary | ICD-10-CM

## 2013-05-08 LAB — US OB FOLLOW UP

## 2013-05-08 NOTE — Progress Notes (Signed)
P = 90   Pt is scheduled for IOL on 3/12.

## 2013-05-10 ENCOUNTER — Encounter (HOSPITAL_COMMUNITY): Payer: Self-pay | Admitting: *Deleted

## 2013-05-10 ENCOUNTER — Inpatient Hospital Stay (HOSPITAL_COMMUNITY)
Admission: AD | Admit: 2013-05-10 | Discharge: 2013-05-10 | Disposition: A | Discharge status: Home / Self Care | Payer: Medicaid Other | Source: Ambulatory Visit | Attending: Obstetrics & Gynecology | Admitting: Obstetrics & Gynecology

## 2013-05-10 ENCOUNTER — Inpatient Hospital Stay (HOSPITAL_COMMUNITY)
Admission: AD | Admit: 2013-05-10 | Discharge: 2013-05-12 | DRG: 767 | Disposition: A | Discharge status: Home / Self Care | Payer: Medicaid Other | Source: Ambulatory Visit | Attending: Obstetrics and Gynecology | Admitting: Obstetrics and Gynecology

## 2013-05-10 DIAGNOSIS — O99330 Smoking (tobacco) complicating pregnancy, unspecified trimester: Secondary | ICD-10-CM

## 2013-05-10 DIAGNOSIS — O99334 Smoking (tobacco) complicating childbirth: Secondary | ICD-10-CM | POA: Diagnosis present

## 2013-05-10 DIAGNOSIS — Z3490 Encounter for supervision of normal pregnancy, unspecified, unspecified trimester: Secondary | ICD-10-CM

## 2013-05-10 DIAGNOSIS — O26899 Other specified pregnancy related conditions, unspecified trimester: Secondary | ICD-10-CM

## 2013-05-10 DIAGNOSIS — IMO0001 Reserved for inherently not codable concepts without codable children: Secondary | ICD-10-CM

## 2013-05-10 DIAGNOSIS — Z302 Encounter for sterilization: Secondary | ICD-10-CM

## 2013-05-10 DIAGNOSIS — O360990 Maternal care for other rhesus isoimmunization, unspecified trimester, not applicable or unspecified: Secondary | ICD-10-CM | POA: Diagnosis present

## 2013-05-10 DIAGNOSIS — O479 False labor, unspecified: Secondary | ICD-10-CM | POA: Insufficient documentation

## 2013-05-10 DIAGNOSIS — O0930 Supervision of pregnancy with insufficient antenatal care, unspecified trimester: Secondary | ICD-10-CM

## 2013-05-10 DIAGNOSIS — Z6791 Unspecified blood type, Rh negative: Secondary | ICD-10-CM

## 2013-05-10 LAB — CBC
HEMATOCRIT: 36.2 % (ref 36.0–46.0)
HEMOGLOBIN: 13.1 g/dL (ref 12.0–15.0)
MCH: 35.2 pg — ABNORMAL HIGH (ref 26.0–34.0)
MCHC: 36.2 g/dL — AB (ref 30.0–36.0)
MCV: 97.3 fL (ref 78.0–100.0)
Platelets: 270 10*3/uL (ref 150–400)
RBC: 3.72 MIL/uL — AB (ref 3.87–5.11)
RDW: 13.5 % (ref 11.5–15.5)
WBC: 17.9 10*3/uL — AB (ref 4.0–10.5)

## 2013-05-10 MED ORDER — TETANUS-DIPHTH-ACELL PERTUSSIS 5-2.5-18.5 LF-MCG/0.5 IM SUSP: 0.5000 mL | Freq: Once | INTRAMUSCULAR | Status: DC

## 2013-05-10 MED ORDER — BENZOCAINE-MENTHOL 20-0.5 % EX AERO
1.0000 "application " | INHALATION_SPRAY | CUTANEOUS | Status: DC | PRN
  Administered 2013-05-10: 1 via TOPICAL
  Filled 2013-05-10: qty 56

## 2013-05-10 MED ORDER — PRENATAL MULTIVITAMIN CH: 1.0000 | ORAL_TABLET | Freq: Every day | ORAL | Status: DC

## 2013-05-10 MED ORDER — ONDANSETRON HCL 4 MG/2ML IJ SOLN: 4.0000 mg | Freq: Four times a day (QID) | INTRAMUSCULAR | Status: DC | PRN

## 2013-05-10 MED ORDER — LACTATED RINGERS IV SOLN: 500.0000 mL | INTRAVENOUS | Status: DC | PRN

## 2013-05-10 MED ORDER — OXYCODONE-ACETAMINOPHEN 5-325 MG PO TABS
1.0000 | ORAL_TABLET | ORAL | Status: DC | PRN
  Administered 2013-05-11 (×2): 1 via ORAL
  Filled 2013-05-10 (×2): qty 1

## 2013-05-10 MED ORDER — LACTATED RINGERS IV SOLN: INTRAVENOUS | Status: DC

## 2013-05-10 MED ORDER — OXYTOCIN BOLUS FROM INFUSION
500.0000 mL | INTRAVENOUS | Status: DC
  Administered 2013-05-10: 500 mL via INTRAVENOUS

## 2013-05-10 MED ORDER — ACETAMINOPHEN 325 MG PO TABS: 650.0000 mg | ORAL_TABLET | ORAL | Status: DC | PRN

## 2013-05-10 MED ORDER — OXYTOCIN 40 UNITS IN LACTATED RINGERS INFUSION - SIMPLE MED
62.5000 mL/h | INTRAVENOUS | Status: DC
  Administered 2013-05-10: 62.5 mL/h via INTRAVENOUS

## 2013-05-10 MED ORDER — LIDOCAINE HCL (PF) 1 % IJ SOLN
30.0000 mL | INTRAMUSCULAR | Status: DC | PRN
  Filled 2013-05-10: qty 30

## 2013-05-10 MED ORDER — SIMETHICONE 80 MG PO CHEW: 80.0000 mg | CHEWABLE_TABLET | ORAL | Status: DC | PRN

## 2013-05-10 MED ORDER — OXYCODONE-ACETAMINOPHEN 5-325 MG PO TABS: 1.0000 | ORAL_TABLET | ORAL | Status: DC | PRN

## 2013-05-10 MED ORDER — DIPHENHYDRAMINE HCL 25 MG PO CAPS: 25.0000 mg | ORAL_CAPSULE | Freq: Four times a day (QID) | ORAL | Status: DC | PRN

## 2013-05-10 MED ORDER — METOCLOPRAMIDE HCL 10 MG PO TABS
10.0000 mg | ORAL_TABLET | Freq: Once | ORAL | Status: AC
  Administered 2013-05-11: 10 mg via ORAL
  Filled 2013-05-10: qty 1

## 2013-05-10 MED ORDER — ONDANSETRON HCL 4 MG PO TABS: 4.0000 mg | ORAL_TABLET | ORAL | Status: DC | PRN

## 2013-05-10 MED ORDER — CITRIC ACID-SODIUM CITRATE 334-500 MG/5ML PO SOLN: 30.0000 mL | ORAL | Status: DC | PRN

## 2013-05-10 MED ORDER — WITCH HAZEL-GLYCERIN EX PADS: 1.0000 "application " | MEDICATED_PAD | CUTANEOUS | Status: DC | PRN

## 2013-05-10 MED ORDER — OXYTOCIN 40 UNITS IN LACTATED RINGERS INFUSION - SIMPLE MED
INTRAVENOUS | Status: AC
  Filled 2013-05-10: qty 1000

## 2013-05-10 MED ORDER — LANOLIN HYDROUS EX OINT: TOPICAL_OINTMENT | CUTANEOUS | Status: DC | PRN

## 2013-05-10 MED ORDER — SENNOSIDES-DOCUSATE SODIUM 8.6-50 MG PO TABS
2.0000 | ORAL_TABLET | ORAL | Status: DC
  Administered 2013-05-11: 2 via ORAL
  Filled 2013-05-10 (×2): qty 2

## 2013-05-10 MED ORDER — IBUPROFEN 600 MG PO TABS
600.0000 mg | ORAL_TABLET | Freq: Four times a day (QID) | ORAL | Status: DC | PRN
  Administered 2013-05-10: 600 mg via ORAL
  Filled 2013-05-10: qty 1

## 2013-05-10 MED ORDER — ONDANSETRON HCL 4 MG/2ML IJ SOLN: 4.0000 mg | INTRAMUSCULAR | Status: DC | PRN

## 2013-05-10 MED ORDER — FLEET ENEMA 7-19 GM/118ML RE ENEM: 1.0000 | ENEMA | RECTAL | Status: DC | PRN

## 2013-05-10 MED ORDER — DIBUCAINE 1 % RE OINT: 1.0000 "application " | TOPICAL_OINTMENT | RECTAL | Status: DC | PRN

## 2013-05-10 MED ORDER — FAMOTIDINE 20 MG PO TABS
40.0000 mg | ORAL_TABLET | Freq: Once | ORAL | Status: AC
  Administered 2013-05-11: 40 mg via ORAL
  Filled 2013-05-10: qty 2

## 2013-05-10 MED ORDER — IBUPROFEN 600 MG PO TABS
600.0000 mg | ORAL_TABLET | Freq: Four times a day (QID) | ORAL | Status: DC
  Administered 2013-05-11 – 2013-05-12 (×3): 600 mg via ORAL
  Filled 2013-05-10 (×3): qty 1

## 2013-05-10 MED ORDER — ZOLPIDEM TARTRATE 5 MG PO TABS: 5.0000 mg | ORAL_TABLET | Freq: Every evening | ORAL | Status: DC | PRN

## 2013-05-10 MED ORDER — LIDOCAINE HCL (PF) 1 % IJ SOLN
INTRAMUSCULAR | Status: AC
  Administered 2013-05-10: 19:00:00
  Filled 2013-05-10: qty 30

## 2013-05-10 NOTE — MAU Note (Signed)
PT SAYS SHE STARTED HURTING BAD AT 0730. GETS [facility] - IN CLINIC  DOWNSTAIRS-  VE ON Thursday  1-2 CM - STRIPPED MEMBRANES.  HAD NST ON Monday.      TOMORROW  HAS AN APPOINTMENT  FOR  DISCUSS INDUCTION.   DENIES HSV AND MRSA.

## 2013-05-10 NOTE — H&P (Signed)
Melissa Walker is a 33 y.o. female presenting for SOL.    History Melissa Walker was seen earlier this morning in the MAU and was sent home as her cervix was not favorable. She returned to the MAU with regular contractions, and her cervical exam was rated at an 8. She was taken to the birthing for non-complicated SVD after 20 minutes of labor.    OB History   Grav Para Term Preterm Abortions TAB SAB Ect Mult Living   2 1 1       1      Past Medical History  Diagnosis Date  . Neuromuscular disorder   . Tobacco abuse   . Chronic back pain   . Abnormal uterine bleeding 02/04/2012    Occurred 01/12/12-01/15/12. Evaluated in MAU and all labs, exam, and pelvic u/s WNL. Given rx for ibuprofen and told to follow up as otupatient.     Past Surgical History  Procedure Laterality Date  . Dental rehabilitation      full set dentures  . Multiple epidural steroid injections in back      Over past 2 years   Family History: family history includes Cancer in her father and maternal grandmother; Eclampsia in her maternal grandmother; Heart attack in her maternal grandmother; Heart disease in her maternal grandmother; Hyperlipidemia in her maternal grandmother; Hypertension in her maternal grandmother. Social History:  reports that she has been smoking Cigarettes.  She has been smoking about 0.50 packs per day. She has never used smokeless tobacco. She reports that she does not drink alcohol or use illicit drugs.   Prenatal Transfer Tool  Maternal Diabetes: No Genetic Screening: Normal Maternal Ultrasounds/Referrals: Normal Fetal Ultrasounds or other Referrals:  None Maternal Substance Abuse:  Yes:  Type: Smoker Significant Maternal Medications:  Meds include: Other:  Significant Maternal Lab Results:  Lab values include: Group B Strep negative, Rh negative Other Comments:  1/2 ppd  Review of Systems  Unable to perform ROS Patient was in active labor at first encounter.  Dilation:  10 Effacement (%): 100 Station: +2 Exam by:: B.Bethea RN Blood pressure 139/76, pulse 100, temperature 97.9 F (36.6 C), resp. rate 18, last menstrual period 08/17/2012. Maternal Exam:  Uterine Assessment: Contraction strength is firm.  Contraction duration is 90 seconds. Contraction frequency is regular.   Introitus: Ferning test: not done.   Cervix: Cervix evaluated by digital exam.     Fetal Exam Fetal Monitor Review: Mode: ultrasound.   Variability: moderate (6-25 bpm).   Pattern: accelerations present and no decelerations.    Fetal State Assessment: Category I - tracings are normal.     Physical Exam  Prenatal labs: ABO, Rh: A/NEG/-- (10/23 1016) Antibody: NEG (12/18 1002) Rubella: 4.41 (10/23 1016) RPR: NON REAC (12/18 1002)  HBsAg: NEGATIVE (10/23 1016)  HIV: NON REACTIVE (12/18 1002)  GBS: Negative (02/05 0000)   Assessment/Plan: 33 y.o. G2P1001 @ [redacted]w[redacted]d presents in active labor. Followed by [facility].   #Labor: SVD without augmentation #Pain: Nothing #FWB: Category I #ID:  GBS - #Feeding: Bottle #MOC: BTL; will follow up to confirm # Smoker per records Melissa Walker 05/10/2013, 8:46 PM

## 2013-05-10 NOTE — Progress Notes (Signed)
Dr Nevada Crane informed of pt's presence on the unit and pt's complaint of SROM. Pt states she needs to push Carmelia Roller CNM informed and VE performed.

## 2013-05-10 NOTE — MAU Note (Signed)
Patient states she has been having frequent contractions since this am. Denies bleeding or leaking. Reports good fetal movement.

## 2013-05-10 NOTE — MAU Note (Addendum)
Pt state her water broke about 40 mins ago. Pt states she was seen this morning and has been contracting all day. Pt states she was 3cm when she left

## 2013-05-11 ENCOUNTER — Encounter (HOSPITAL_COMMUNITY)
Admission: AD | Disposition: A | Discharge status: Home / Self Care | Payer: Self-pay | Source: Ambulatory Visit | Attending: Obstetrics and Gynecology

## 2013-05-11 ENCOUNTER — Encounter (HOSPITAL_COMMUNITY): Payer: Medicaid Other | Admitting: Anesthesiology

## 2013-05-11 ENCOUNTER — Inpatient Hospital Stay (HOSPITAL_COMMUNITY): Payer: Medicaid Other | Admitting: Anesthesiology

## 2013-05-11 ENCOUNTER — Inpatient Hospital Stay (HOSPITAL_COMMUNITY): Admission: RE | Admit: 2013-05-11 | Payer: Medicaid Other | Source: Ambulatory Visit

## 2013-05-11 DIAGNOSIS — Z302 Encounter for sterilization: Secondary | ICD-10-CM

## 2013-05-11 DIAGNOSIS — O99334 Smoking (tobacco) complicating childbirth: Secondary | ICD-10-CM | POA: Diagnosis not present

## 2013-05-11 DIAGNOSIS — O360990 Maternal care for other rhesus isoimmunization, unspecified trimester, not applicable or unspecified: Secondary | ICD-10-CM | POA: Diagnosis not present

## 2013-05-11 HISTORY — PX: TUBAL LIGATION: SHX77

## 2013-05-11 LAB — CBC
HCT: 32.4 % — ABNORMAL LOW (ref 36.0–46.0)
Hemoglobin: 11.5 g/dL — ABNORMAL LOW (ref 12.0–15.0)
MCH: 35.2 pg — AB (ref 26.0–34.0)
MCHC: 35.5 g/dL (ref 30.0–36.0)
MCV: 99.1 fL (ref 78.0–100.0)
PLATELETS: 220 10*3/uL (ref 150–400)
RBC: 3.27 MIL/uL — ABNORMAL LOW (ref 3.87–5.11)
RDW: 13.2 % (ref 11.5–15.5)
WBC: 18.2 10*3/uL — ABNORMAL HIGH (ref 4.0–10.5)

## 2013-05-11 LAB — RPR: RPR Ser Ql: NONREACTIVE

## 2013-05-11 LAB — SURGICAL PCR SCREEN
MRSA, PCR: NEGATIVE
Staphylococcus aureus: NEGATIVE

## 2013-05-11 SURGERY — LIGATION, FALLOPIAN TUBE, POSTPARTUM
Anesthesia: Spinal | Site: Abdomen | Laterality: Bilateral

## 2013-05-11 MED ORDER — MIDAZOLAM HCL 2 MG/2ML IJ SOLN: 0.5000 mg | Freq: Once | INTRAMUSCULAR | Status: AC | PRN

## 2013-05-11 MED ORDER — ONDANSETRON HCL 4 MG/2ML IJ SOLN
INTRAMUSCULAR | Status: AC
  Filled 2013-05-11: qty 2

## 2013-05-11 MED ORDER — FENTANYL CITRATE 0.05 MG/ML IJ SOLN: 25.0000 ug | INTRAMUSCULAR | Status: DC | PRN

## 2013-05-11 MED ORDER — FENTANYL CITRATE 0.05 MG/ML IJ SOLN
INTRAMUSCULAR | Status: AC
  Filled 2013-05-11: qty 2

## 2013-05-11 MED ORDER — MIDAZOLAM HCL 2 MG/2ML IJ SOLN
INTRAMUSCULAR | Status: DC | PRN
  Administered 2013-05-11: 1 mg via INTRAVENOUS

## 2013-05-11 MED ORDER — MIDAZOLAM HCL 2 MG/2ML IJ SOLN
INTRAMUSCULAR | Status: AC
  Filled 2013-05-11: qty 2

## 2013-05-11 MED ORDER — PROMETHAZINE HCL 25 MG/ML IJ SOLN: 6.2500 mg | INTRAMUSCULAR | Status: DC | PRN

## 2013-05-11 MED ORDER — ONDANSETRON HCL 4 MG/2ML IJ SOLN
INTRAMUSCULAR | Status: DC | PRN
  Administered 2013-05-11: 4 mg via INTRAVENOUS

## 2013-05-11 MED ORDER — BUPIVACAINE HCL (PF) 0.5 % IJ SOLN
INTRAMUSCULAR | Status: DC | PRN
  Administered 2013-05-11: 8 mL

## 2013-05-11 MED ORDER — KETOROLAC TROMETHAMINE 30 MG/ML IJ SOLN
INTRAMUSCULAR | Status: AC
  Filled 2013-05-11: qty 1

## 2013-05-11 MED ORDER — KETOROLAC TROMETHAMINE 30 MG/ML IJ SOLN
15.0000 mg | Freq: Once | INTRAMUSCULAR | Status: AC | PRN
  Administered 2013-05-11: 30 mg via INTRAVENOUS

## 2013-05-11 MED ORDER — LACTATED RINGERS IV SOLN
INTRAVENOUS | Status: DC | PRN
  Administered 2013-05-11: 12:00:00 via INTRAVENOUS

## 2013-05-11 MED ORDER — MEPERIDINE HCL 25 MG/ML IJ SOLN: 6.2500 mg | INTRAMUSCULAR | Status: DC | PRN

## 2013-05-11 MED ORDER — PHENYLEPHRINE 40 MCG/ML (10ML) SYRINGE FOR IV PUSH (FOR BLOOD PRESSURE SUPPORT)
PREFILLED_SYRINGE | INTRAVENOUS | Status: AC
  Filled 2013-05-11: qty 5

## 2013-05-11 MED ORDER — FENTANYL CITRATE 0.05 MG/ML IJ SOLN
INTRAMUSCULAR | Status: DC | PRN
  Administered 2013-05-11: 50 ug via INTRAVENOUS

## 2013-05-11 MED ORDER — BUPIVACAINE HCL (PF) 0.5 % IJ SOLN
INTRAMUSCULAR | Status: AC
  Filled 2013-05-11: qty 30

## 2013-05-11 SURGICAL SUPPLY — 33 items
BLADE SURG 11 STRL SS (BLADE) ×3 IMPLANT
CHLORAPREP W/TINT 26ML (MISCELLANEOUS) ×3 IMPLANT
CLIP FILSHIE TUBAL LIGA STRL (Clip) ×4 IMPLANT
CLOTH BEACON ORANGE TIMEOUT ST (SAFETY) ×3 IMPLANT
DRSG COVADERM PLUS 2X2 (GAUZE/BANDAGES/DRESSINGS) ×2 IMPLANT
ELECT REM PT RETURN 9FT ADLT (ELECTROSURGICAL) ×6
ELECTRODE REM PT RTRN 9FT ADLT (ELECTROSURGICAL) ×2 IMPLANT
GLOVE BIO SURGEON STRL SZ7 (GLOVE) ×3 IMPLANT
GLOVE BIO SURGEON STRL SZ7.5 (GLOVE) ×2 IMPLANT
GLOVE BIOGEL PI IND STRL 7.0 (GLOVE) ×2 IMPLANT
GLOVE BIOGEL PI IND STRL 7.5 (GLOVE) ×1 IMPLANT
GLOVE BIOGEL PI INDICATOR 7.0 (GLOVE) ×4
GLOVE BIOGEL PI INDICATOR 7.5 (GLOVE) ×2
GLOVE SURG SS PI 7.0 STRL IVOR (GLOVE) ×10 IMPLANT
GOWN STRL REUS W/ TWL XL LVL3 (GOWN DISPOSABLE) ×1 IMPLANT
GOWN STRL REUS W/TWL LRG LVL3 (GOWN DISPOSABLE) ×7 IMPLANT
GOWN STRL REUS W/TWL XL LVL3 (GOWN DISPOSABLE) ×3
NDL HYPO 25X1 1.5 SAFETY (NEEDLE) ×1 IMPLANT
NEEDLE HYPO 25X1 1.5 SAFETY (NEEDLE) ×3 IMPLANT
NS IRRIG 1000ML POUR BTL (IV SOLUTION) ×3 IMPLANT
PACK ABDOMINAL MINOR (CUSTOM PROCEDURE TRAY) ×3 IMPLANT
PAD OB MATERNITY 4.3X12.25 (PERSONAL CARE ITEMS) ×2 IMPLANT
PENCIL BUTTON HOLSTER BLD 10FT (ELECTRODE) ×3 IMPLANT
SPONGE GAUZE 2X2 8PLY STER LF (GAUZE/BANDAGES/DRESSINGS) ×1
SPONGE GAUZE 2X2 8PLY STRL LF (GAUZE/BANDAGES/DRESSINGS) ×1 IMPLANT
SPONGE LAP 4X18 X RAY DECT (DISPOSABLE) ×2 IMPLANT
SUT VIC AB 0 CT1 27 (SUTURE) ×6
SUT VIC AB 0 CT1 27XBRD ANBCTR (SUTURE) ×1 IMPLANT
SUT VICRYL 4-0 PS2 18IN ABS (SUTURE) ×3 IMPLANT
SYR CONTROL 10ML LL (SYRINGE) ×3 IMPLANT
TOWEL OR 17X24 6PK STRL BLUE (TOWEL DISPOSABLE) ×6 IMPLANT
TRAY FOLEY CATH 14FR (SET/KITS/TRAYS/PACK) ×3 IMPLANT
WATER STERILE IRR 1000ML POUR (IV SOLUTION) ×3 IMPLANT

## 2013-05-11 NOTE — Anesthesia Preprocedure Evaluation (Signed)
Anesthesia Evaluation  Patient identified by MRN, date of birth, ID band Patient awake    Reviewed: Allergy & Precautions, H&P , Patient's Chart, lab work & pertinent test results  Airway Mallampati: II      Dental   Pulmonary Current Smoker,  breath sounds clear to auscultation        Cardiovascular Exercise Tolerance: Good Rhythm:regular Rate:Normal     Neuro/Psych negative psych ROS   GI/Hepatic   Endo/Other    Renal/GU      Musculoskeletal   Abdominal   Peds  Hematology   Anesthesia Other Findings   Reproductive/Obstetrics                           Anesthesia Physical Anesthesia Plan  ASA: II  Anesthesia Plan: Spinal   Post-op Pain Management:    Induction:   Airway Management Planned:   Additional Equipment:   Intra-op Plan:   Post-operative Plan:   Informed Consent: I have reviewed the patients History and Physical, chart, labs and discussed the procedure including the risks, benefits and alternatives for the proposed anesthesia with the patient or authorized representative who has indicated his/her understanding and acceptance.     Plan Discussed with: Anesthesiologist, CRNA and Surgeon  Anesthesia Plan Comments:         Anesthesia Quick Evaluation

## 2013-05-11 NOTE — Progress Notes (Signed)
Post Partum Day 1, delivered last night Subjective: no complaints, up ad lib, voiding and tolerating PO  Objective: Blood pressure 113/74, pulse 71, temperature 97.9 F (36.6 C), temperature source Oral, resp. rate 18, height 5\' 3"  (1.6 m), weight 56.7 kg (125 lb), last menstrual period 08/17/2012, unknown if currently breastfeeding.  Physical Exam:  General: alert, cooperative, appears stated age and no distress Lochia: appropriate Uterine Fundus: U- 1 Firm  Incision: NA DVT Evaluation: No evidence of DVT seen on physical exam. Negative Homan's sign. No cords or calf tenderness. No significant calf/ankle edema.   Recent Labs  05/10/13 1840 05/11/13 0600  HGB 13.1 11.5*  HCT 36.2 32.4*    Assessment/Plan: Plan for discharge tomorrow and Contraception Tubal today - Currently NPO Bottle feeding    LOS: 1 day   Melissa Walker 05/11/2013, 7:57 AM

## 2013-05-11 NOTE — Progress Notes (Signed)
Faculty Practice OB/GYN Attending Preoperative Note  33 y.o. [id number] PPD#1 s/p SVD who desires permanent sterilization.  Other reversible forms of contraception were discussed with patient; she declines all other modalities. Risks of procedure discussed with patient including but not limited to: risk of regret, permanence of method, bleeding, infection, injury to surrounding organs and need for additional procedures.  Failure risk of 1-2 % with increased risk of ectopic gestation if pregnancy occurs was also discussed with patient.  Patient verbalized understanding of these risks and wants to proceed with sterilization.  Written informed consent obtained.  To OR when ready.  Verita Schneiders, MD, [place] Attending [place], [place]

## 2013-05-11 NOTE — Op Note (Signed)
Attestation of Attending Supervision of Obstetric Fellow During Surgery: Surgery was performed by the Obstetric Fellow under my supervision and collaboration.  I have reviewed the Obstetric Fellow's operative report, and I agree with the documentation.  I was present and scrubbed for the entire procedure.    Verita Schneiders, MD, [place] Attending [place], [place]

## 2013-05-11 NOTE — Anesthesia Postprocedure Evaluation (Signed)
  Anesthesia Post Note  Patient: Melissa Walker  Procedure(s) Performed: Procedure(s) (LRB): POST PARTUM TUBAL LIGATION (Bilateral)  Anesthesia type: Spinal  Patient location: PACU  Post pain: Pain level controlled  Post assessment: Post-op Vital signs reviewed  Last Vitals:  Filed Vitals:   05/11/13 1315  BP: 122/71  Pulse: 60  Temp:   Resp: 20    Post vital signs: Reviewed  Level of consciousness: awake  Complications: No apparent anesthesia complications

## 2013-05-11 NOTE — Transfer of Care (Signed)
Immediate Anesthesia Transfer of Care Note  Patient: Melissa Walker  Procedure(s) Performed: Procedure(s): POST PARTUM TUBAL LIGATION (Bilateral)  Patient Location: PACU  Anesthesia Type:Spinal  Level of Consciousness: awake, alert  and oriented  Airway & Oxygen Therapy: Patient Spontanous Breathing  Post-op Assessment: Report given to PACU RN and Post -op Vital signs reviewed and stable  Post vital signs: Reviewed and stable  Complications: No apparent anesthesia complications

## 2013-05-11 NOTE — Op Note (Signed)
[name] PROCEDURE DATE: 05/10/2013 - 05/11/2013  PREOPERATIVE DIAGNOSES: Intrauterine pregnancy at  [redacted]w[redacted]d weeks gestation; undesired fertility  POSTOPERATIVE DIAGNOSES: The same  PROCEDURE: Bilateral Tubal Sterilization using Filshie clips  SURGEON:  Dr. Verita Schneiders  ASSISTANT:  Dr. Leslie Andrea  INDICATIONS: SEAIRA BYUS is a 33 y.o. [id number] at [redacted]w[redacted]d here for bilateral tubal sterilization secondary to the indications listed under preoperative diagnoses; please see preoperative note for further details.Patient desires permanent sterilization.  Other reversible forms of contraception were discussed with patient; she declines all other modalities. Risks of procedure discussed with patient including but not limited to: risk of regret, permanence of method, bleeding, infection, injury to surrounding organs and need for additional procedures.  Failure risk of 0.5-1% with increased risk of ectopic gestation if pregnancy occurs was also discussed with patient.  The patient concurred with the proposed plan, giving informed written consent for the procedures.    FINDINGS:  Fallopian tubes and fimbriae bilaterally.  ANESTHESIA: bolused epidrual Epidural ESTIMATED BLOOD LOSS: 10 ml URINE OUTPUT:  Not measured  COMPLICATIONS: None immediate  PROCEDURE IN DETAIL:  The patient preoperatively received intravenous antibiotics and had sequential compression devices applied to her lower extremities.   She was then taken to the operating room where the epidural anesthesia was dosed up to surgical level and was found to be adequate. She was then placed in a dorsal supine position with a leftward tilt, and prepped and draped in a sterile manner. A small incision was made below umbilicus through the fascia, the fallopian tubes were grasped with babcocks and fimbriae visualized bilaterally. A Filshie clip was placed on both tubes, about 3 cm from the cornua, with care given to incorporate the underlying  mesosalpinx on both sides, allowing for bilateral tubal sterilization. A small amount of 0.5% marcaine was injected into the tubes.  The fascia was then closed using 0 Vicryl in a running fashion.  8 ml of 0.5% Marcaine was injected subcutaneously around the incision.  The skin was closed with a 4-0 Vicryl subcuticular stitch.  Sponge, lap, instrument and needle counts were correct x 2.  She was taken to the recovery room in stable condition.   Fredrik Rigger, MD OB Fellow

## 2013-05-11 NOTE — Progress Notes (Signed)
UR completed 

## 2013-05-12 ENCOUNTER — Encounter (HOSPITAL_COMMUNITY): Payer: Self-pay | Admitting: Obstetrics & Gynecology

## 2013-05-12 MED ORDER — IBUPROFEN 600 MG PO TABS: 600.0000 mg | ORAL_TABLET | Freq: Four times a day (QID) | ORAL | Status: DC

## 2013-05-12 MED ORDER — OXYCODONE-ACETAMINOPHEN 5-325 MG PO TABS: 1.0000 | ORAL_TABLET | ORAL | Status: DC | PRN

## 2013-05-12 NOTE — Discharge Summary (Signed)
Melissa Discharge Summary  Reason for Admission: rupture of membranes  Prenatal Procedures: NST  Intrapartum Procedures: spontaneous vaginal delivery  Postpartum Procedures: none  Complications-Operative and Postpartum: none   Hemoglobin   Date  Value  Ref Range  Status   05/11/2013  11.5*  12.0 - 15.0 g/dL  Final      HCT   Date  Value  Ref Range  Status   05/11/2013  32.4*  36.0 - 46.0 %  Final    Discharge Diagnoses: Term Pregnancy-delivered   Hospital Course: Melissa Walker is a 33 y.o. [id number] who presented with ROM. She had a uncomplicated SVD. She was able to ambulate, tolerate PO and void normally. She was discharged home with instructions for postpartum care. Pt will be using bottle-feeding. She is planning on mirena for contraception.     Delivery Note  At 7:02 PM a viable female was delivered via Vaginal, Spontaneous Delivery (Presentation: Right Occiput Anterior). APGAR: 9, 9; weight 7 lb 2.8 oz (3255 g).   Placenta status: Intact, Spontaneous. Cord: 3 vessels with the following complications: None. Cord pH: not collected  Anesthesia: None  Episiotomy: None  Lacerations: 1st degree  Suture Repair: 3.0 vicryl  Est. Blood Loss (mL): 400  Mom to postpartum. Baby to Couplet care / Skin to Skin.   [name] presented in active labor. Viable baby boy SVD. Uncomplicated. Dr. Glo Herring was present and supervised entire procedure. He assisted with laceration repair. Hemostatic. Counts correct x2.  Melissa Walker  05/10/2013, 9:07 PM   Physical Exam:  General: alert and cooperative  Lochia: appropriate  Uterine Fundus: firm  DVT Evaluation: No evidence of DVT seen on physical exam.   Discharge Information:  Date: 05/12/2013  Activity: pelvic rest  Diet: routine  Medications: Ibuprofen  Baby feeding: plans to bottle feed  Contraception: bilateral tubal ligation  Condition: stable  Instructions: refer to practice specific booklet  Discharge to: home   Newborn  Data:  Live born female  Birth Weight: 7 lb 2.8 oz (3255 g)  APGAR: 9, 9  Home with mother.   Melissa Angelica, PA-S  I have seen and examined this patient and agree with above documentation in the PA student's note.   Ebbie Latus, M.D. [facility] Fellow 05/12/2013 12:00 PM

## 2013-05-12 NOTE — H&P (Signed)
`````  Attestation of Attending Supervision of Advanced Practitioner: Evaluation and management procedures were performed by the PA/NP/CNM/OB Fellow under my supervision/collaboration. Chart reviewed and agree with management and plan.  Jonnie Kind 05/12/2013 6:39 PM

## 2013-05-12 NOTE — Discharge Planning (Signed)
Obstetric Discharge Summary Reason for Admission: rupture of membranes Prenatal Procedures: NST Intrapartum Procedures: spontaneous vaginal delivery Postpartum Procedures: none Complications-Operative and Postpartum: none Hemoglobin  Date Value Ref Range Status  05/11/2013 11.5* 12.0 - 15.0 g/dL Final     HCT  Date Value Ref Range Status  05/11/2013 32.4* 36.0 - 46.0 % Final    Discharge Diagnoses: Term Pregnancy-delivered  Hospital Course:  Melissa Walker is a 33 y.o. N4O2703 who presented with ROM.  She had a uncomplicated SVD. She was able to ambulate, tolerate PO and void normally. She was discharged home with instructions for postpartum care.  Pt will be using bottle-feeding.   Delivery Note  At 7:02 PM a viable female was delivered via Vaginal, Spontaneous Delivery (Presentation: Right Occiput Anterior). APGAR: 9, 9; weight 7 lb 2.8 oz (3255 g).  Placenta status: Intact, Spontaneous. Cord: 3 vessels with the following complications: None. Cord pH: not collected  Anesthesia: None  Episiotomy: None  Lacerations: 1st degree  Suture Repair: 3.0 vicryl  Est. Blood Loss (mL): 400  Mom to postpartum. Baby to Couplet care / Skin to Skin.  Ms. [place] presented in active labor. Viable baby boy SVD. Uncomplicated. Dr. Glo Herring was present and supervised entire procedure. He assisted with laceration repair. Hemostatic. Counts correct x2.  Melissa Walker  05/10/2013, 9:07 PM   Physical Exam:  General: alert and cooperative Lochia: appropriate Uterine Fundus: firm DVT Evaluation: No evidence of DVT seen on physical exam.  Discharge Information: Date: 05/12/2013 Activity: pelvic rest Diet: routine Medications: Ibuprofen Baby feeding: plans to bottle feed Contraception: bilateral tubal ligation Condition: stable Instructions: refer to practice specific booklet Discharge to: home   Newborn Data: Live born female  Birth Weight: 7 lb 2.8 oz (3255 g) APGAR: 9, 9  Home with  mother.  [name], PA-S

## 2013-05-12 NOTE — Discharge Planning (Signed)
See separate discharge note 

## 2013-05-12 NOTE — Discharge Instructions (Signed)
Postpartum Tubal Ligation Care After Refer to this sheet in the next few weeks. These instructions provide you with information on caring for yourself after your procedure. Your caregiver may also give you more specific instructions. Your treatment has been planned according to current medical practices, but problems sometimes occur. Call your caregiver if you have any problems or questions after your procedure. HOME CARE INSTRUCTIONS   Rest the remainder of the day.  Only take over-the-counter or prescription medicines for pain, discomfort, or fever as directed by your caregiver. Do not take aspirin. It can cause bleeding.  Gradually resume daily activities, diet, rest, driving, and work.  Avoid sexual intercourse for 2 weeks or as directed.  Do not drive while taking pain medicine.  Do not lift anything over 5 pounds for 2 weeks or as directed.  Only take showers, not baths, until you are seen by your caregiver.  Change bandages (dressings) as directed.  Take your temperature twice a day and record it.  Try to have help for the first 7 10 days for your household needs.  Return to your caregiver to get your stitches (sutures) removed and for follow-up visits as directed. SEEK MEDICAL CARE IF:   You have redness, swelling, or increasing pain in the wound.  You have drainage from the wound lasting longer than 1 day.  Your pain is getting worse.  You have a rash.  You become dizzy or lightheaded.  You have a reaction to your medicine.  You need stronger medicine or a change in your pain medicine.  You notice a bad smell coming from the wound or dressing.  Your wound breaks open after the sutures have been removed.  You are constipated. SEEK IMMEDIATE MEDICAL CARE IF:   You faint.  You have a fever.  You have increasing abdominal pain.  You have severe pain in your shoulders.  You have bleeding or drainage from the suture sites or vagina following surgery.  You  have shortness of breath or difficulty breathing.  You have chest or leg pain.  You have persistent nausea, vomiting, or diarrhea. MAKE SURE YOU:   Understand these instructions.  Watch your condition.  Get help right away if you are not doing well or get worse. Document Released: 08/18/2011 Document Reviewed: 08/18/2011 [facility] Patient Information 2014 [place], [place]. Vaginal Delivery Care After Refer to this sheet in the next few weeks. These discharge instructions provide you with information on caring for yourself after delivery. Your caregiver may also give you specific instructions. Your treatment has been planned according to the most current medical practices available, but problems sometimes occur. Call your caregiver if you have any problems or questions after you go home. HOME CARE INSTRUCTIONS  Take over-the-counter or prescription medicines only as directed by your caregiver or pharmacist.  Do not drink alcohol, especially if you are breastfeeding or taking medicine to relieve pain.  Do not chew or smoke tobacco.  Do not use illegal drugs.  Continue to use good perineal care. Good perineal care includes:  Wiping your perineum from front to back.  Keeping your perineum clean.  Do not use tampons or douche until your caregiver says it is okay.  Shower, wash your hair, and take tub baths as directed by your caregiver.  Wear a well-fitting bra that provides breast support.  Eat healthy foods.  Drink enough fluids to keep your urine clear or pale yellow.  Eat high-fiber foods such as whole grain cereals and breads, brown rice,  beans, and fresh fruits and vegetables every day. These foods may help prevent or relieve constipation.  Follow your cargiver's recommendations regarding resumption of activities such as climbing stairs, driving, lifting, exercising, or traveling.  Talk to your caregiver about resuming sexual activities. Resumption of sexual activities  is dependent upon your risk of infection, your rate of healing, and your comfort and desire to resume sexual activity.  Try to have someone help you with your household activities and your newborn for at least a few days after you leave the hospital.  Rest as much as possible. Try to rest or take a nap when your newborn is sleeping.  Increase your activities gradually.  Keep all of your scheduled postpartum appointments. It is very important to keep your scheduled follow-up appointments. At these appointments, your caregiver will be checking to make sure that you are healing physically and emotionally. SEEK MEDICAL CARE IF:   You are passing large clots from your vagina. Save any clots to show your caregiver.  You have a foul smelling discharge from your vagina.  You have trouble urinating.  You are urinating frequently.  You have pain when you urinate.  You have a change in your bowel movements.  You have increasing redness, pain, or swelling near your vaginal incision (episiotomy) or vaginal tear.  You have pus draining from your episiotomy or vaginal tear.  Your episiotomy or vaginal tear is separating.  You have painful, hard, or reddened breasts.  You have a severe headache.  You have blurred vision or see spots.  You feel sad or depressed.  You have thoughts of hurting yourself or your newborn.  You have questions about your care, the care of your newborn, or medicines.  You are dizzy or lightheaded.  You have a rash.  You have nausea or vomiting.  You were breastfeeding and have not had a menstrual period within 12 weeks after you stopped breastfeeding.  You are not breastfeeding and have not had a menstrual period by the 12th week after delivery.  You have a fever. SEEK IMMEDIATE MEDICAL CARE IF:   You have persistent pain.  You have chest pain.  You have shortness of breath.  You faint.  You have leg pain.  You have stomach pain.  Your  vaginal bleeding saturates two or more sanitary pads in 1 hour. MAKE SURE YOU:   Understand these instructions.  Will watch your condition.  Will get help right away if you are not doing well or get worse. Document Released: 02/14/2000 Document Revised: 11/11/2011 Document Reviewed: 10/14/2011 [facility] Patient Information 2014 [place].

## 2013-05-22 NOTE — Progress Notes (Signed)
NST reviewed and reactive.  

## 2013-06-19 ENCOUNTER — Ambulatory Visit (INDEPENDENT_AMBULATORY_CARE_PROVIDER_SITE_OTHER): Payer: Medicaid Other | Admitting: Obstetrics & Gynecology

## 2013-06-19 ENCOUNTER — Encounter: Payer: Self-pay | Admitting: Obstetrics & Gynecology

## 2013-06-19 NOTE — Patient Instructions (Signed)
Return to clinic for any scheduled appointments or for any gynecologic concerns as needed.   

## 2013-06-19 NOTE — Progress Notes (Signed)
  Subjective:     Melissa Walker is a 33 y.o. G60P2002 female who presents for a postpartum visit. She is 6 weeks postpartum following a spontaneous vaginal delivery. I have fully reviewed the prenatal and intrapartum course. The delivery was at [redacted]w[redacted]d.  Anesthesia: epidural. Postpartum course has been uncomplicated. Baby's course has been complicated by allergy to formula; now taking soy milk. Baby is feeding by bottle. Bleeding no bleeding. Bowel function is normal. Bladder function is normal. Patient is not sexually active. Contraception method is tubal ligation, performed on PPD#1. Postpartum depression screening: negative.  The following portions of the patient's history were reviewed and updated as appropriate: allergies, current medications, past family history, past medical history, past social history, past surgical history and problem list. Normal pap, negative HPV, GC and Chlam on 12/22/12.   Review of Systems Pertinent items are noted in HPI.   Objective:    BP 140/86  Pulse 91  Temp(Src) 97 F (36.1 C) (Oral)  Ht 5\' 3"  (1.6 m)  Wt 100 lb 12.8 oz (45.723 kg)  BMI 17.86 kg/m2  Breastfeeding? No  General:  alert and no distress  Lungs: clear to auscultation bilaterally  Heart:  regular rate and rhythm  Abdomen: soft, non-tender; bowel sounds normal; no masses,  no organomegaly, BTS incision is C/D/I, no erythema, no induration.  Pelvic: Not performed.        Assessment:   Normal postpartum exam. Pap smear not done at today's visit.   Plan:   1. Contraception: tubal ligation 2. Follow up as needed, routine preventative health maintenance measures emphasized.  Verita Schneiders, MD, [place] Attending [place], [place]

## 2013-07-15 ENCOUNTER — Encounter: Payer: Self-pay | Admitting: *Deleted

## 2013-09-22 ENCOUNTER — Other Ambulatory Visit: Payer: Self-pay | Admitting: Oncology

## 2013-09-22 ENCOUNTER — Telehealth: Payer: Self-pay | Admitting: *Deleted

## 2013-09-22 DIAGNOSIS — M545 Low back pain: Secondary | ICD-10-CM

## 2013-09-22 MED ORDER — METHOCARBAMOL 500 MG PO TABS: 750.0000 mg | ORAL_TABLET | Freq: Four times a day (QID) | ORAL | Status: DC

## 2013-09-22 NOTE — Telephone Encounter (Signed)
Pt called for refill of Methocarbamol 500 mg 4 times a day, for chronic back pain. dics problem  Pt has an appointment scheduled in clinic 8/7, last visit 09/2012 GYN doctor gave pt flexeril during pregnancy, now that she is 4 months post partum pt wants to return to [place]. Not breast feeding, per patient. Will you refill before appointment? Pt # [id number]  Vladimir Faster on [employer]

## 2013-09-22 NOTE — Telephone Encounter (Signed)
I will refill  She has appt here in August

## 2013-09-22 NOTE — Telephone Encounter (Signed)
Rx faxed in.

## 2013-10-06 ENCOUNTER — Encounter: Payer: Self-pay | Admitting: Internal Medicine

## 2013-10-06 ENCOUNTER — Ambulatory Visit (INDEPENDENT_AMBULATORY_CARE_PROVIDER_SITE_OTHER): Payer: Medicaid Other | Admitting: Internal Medicine

## 2013-10-06 VITALS — BP 117/75 | HR 77 | Temp 97.1°F | Ht 62.0 in | Wt 94.0 lb

## 2013-10-06 DIAGNOSIS — M545 Low back pain, unspecified: Secondary | ICD-10-CM

## 2013-10-06 MED ORDER — HYDROCODONE-ACETAMINOPHEN 5-325 MG PO TABS: 1.0000 | ORAL_TABLET | Freq: Four times a day (QID) | ORAL | Status: DC | PRN

## 2013-10-06 NOTE — Patient Instructions (Signed)
-  Take norco 5-325 mg every 6 hrs as needed for pain -Will refer you to pain clinic -Pleasure meeting you!  General Instructions:   Please try to bring all your medicines next time. This will help [place] keep you safe from mistakes.   Progress Toward Treatment Goals:  No flowsheet data found.  Self Care Goals & Plans:  Self Care Goal 10/06/2013  Manage my medications take my medicines as prescribed; bring my medications to every visit; refill my medications on time  Eat healthy foods eat more vegetables; eat foods that are low in salt; eat baked foods instead of fried foods  Be physically active (No Data)    No flowsheet data found.   Care Management & Community Referrals:  No flowsheet data found.

## 2013-10-08 NOTE — Progress Notes (Signed)
Patient ID: Melissa Walker, female   DOB: 1980/10/02, 33 y.o.   MRN: [id number]  Ms.   Subjective:   Patient ID: Melissa Walker female   DOB: 09-15-1980 33 y.o.   MRN: [id number]  HPI: MelissaMelissa Walker is a 34 y.o. pleasant woman with past medical history of chronic low back pain and tobacco abuse who presents with chief complaint of uncontrolled low back pain and muscle spasms.    She reports chronic low back pain since an injury following childbirth about 10 years ago. She has tried numerous medications including tramadol, meloxicam, and flexiril. She was seen at [facility] pain clinic some time ago and at that time refused nerve block procedure. She has also previously had physical therapy and corticosteroid injection without relief. She is currently taking methocarbamol 750 mg four times daily which helps somewhat. She has not previously been on narcotic therapy. Her pain is 10/10 located on the right side of her body, involving the right hip and leg with numbness and tingling of her right LE. She has difficulty ambulating and feels that she wobbles when she walks. She denies fever, chills, weight loss, LE weakness, or bladder/bowel incontinence. She has not had recent injury, trauma, or fall.     Past Medical History  Diagnosis Date  . Neuromuscular disorder   . Tobacco abuse   . Chronic back pain   . Abnormal uterine bleeding 02/04/2012    Occurred 01/12/12-01/15/12. Evaluated in MAU and all labs, exam, and pelvic u/s WNL. Given rx for ibuprofen and told to follow up as otupatient.     Current Outpatient Prescriptions  Medication Sig Dispense Refill  . HYDROcodone-acetaminophen (NORCO) 5-325 MG per tablet Take 1 tablet by mouth every 6 (six) hours as needed for moderate pain.  120 tablet  0  . methocarbamol (ROBAXIN) 500 MG tablet Take 1.5 tablets (750 mg total) by mouth 4 (four) times daily.  180 tablet  2  . Prenatal Vit-Fe Fumarate-FA (PRENATAL MULTIVITAMIN) TABS tablet  Take 1 tablet by mouth daily at 12 noon.       No current facility-administered medications for this visit.   Family History  Problem Relation Age of Onset  . Eclampsia Maternal Grandmother   . Heart attack Maternal Grandmother   . Cancer Maternal Grandmother     lung  . Heart disease Maternal Grandmother   . Hyperlipidemia Maternal Grandmother   . Hypertension Maternal Grandmother   . Cancer Father     brain tumor    History   Social History  . Marital Status: Single    Spouse Name: N/A    Number of Children: N/A  . Years of Education: N/A   Social History Main Topics  . Smoking status: Current Every Day Smoker -- 0.50 packs/day    Types: Cigarettes  . Smokeless tobacco: Never Used     Comment: TRYING TO QUIT/ 1/2 PPD  . Alcohol Use: No     Comment: occasional  . Drug Use: No  . Sexual Activity: Yes    Birth Control/ Protection: Condom   Other Topics Concern  . None   Social History Narrative  . None   Review of Systems: Review of Systems  Constitutional: Negative for fever, chills and weight loss.  Eyes: Negative for blurred vision.  Respiratory: Negative for cough and shortness of breath.   Cardiovascular: Negative for chest pain, palpitations and leg swelling.  Gastrointestinal: Negative for nausea, vomiting, abdominal pain, diarrhea and constipation.  Genitourinary: Negative  for dysuria, urgency and frequency.  Musculoskeletal: Positive for back pain (chronic low back pain) and myalgias (spasm of back). Negative for falls.  Neurological: Positive for sensory change (right LE). Negative for dizziness, focal weakness and headaches.     Objective:  Physical Exam: Filed Vitals:   10/06/13 1511  BP: 117/75  Pulse: 77  Temp: 97.1 F (36.2 C)  TempSrc: Oral  Height: 5\' 2"  (1.575 m)  Weight: 94 lb (42.638 kg)  SpO2: 100%    Physical Exam  Constitutional: She is oriented to person, place, and time. She appears well-developed and well-nourished. No  distress.  HENT:  Head: Normocephalic and atraumatic.  Eyes: EOM are normal.  Neck: Normal range of motion. Neck supple.  Cardiovascular: Normal rate, regular rhythm and normal heart sounds.   Pulmonary/Chest: Effort normal and breath sounds normal. No respiratory distress. She has no wheezes. She has no rales.  Abdominal: Soft. Bowel sounds are normal. She exhibits no distension. There is no tenderness. There is no rebound and no guarding.  Musculoskeletal: Normal range of motion. She exhibits no edema and no tenderness.  Neurological: She is alert and oriented to person, place, and time.  Normal 5/5 muscle strength throughout Decreased sensation to light touch of right LE Straight leg test positive on right  Skin: Skin is warm and dry. No rash noted. She is not diaphoretic. No erythema. No pallor.  Psychiatric: She has a normal mood and affect. Her behavior is normal. Judgment and thought content normal.    Assessment & Plan:   Please see problem list for problem-based assessment and plan

## 2013-10-09 NOTE — Progress Notes (Signed)
Attending physician note: Presenting problems, physical findings, medications, reviewed with resident physician Dr. Juluis Mire and I concur with her assessment and plan. Murriel Hopper M.D. FACP

## 2013-10-09 NOTE — Assessment & Plan Note (Addendum)
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   Plan:  -Refer to pain clinic for further pain control and management  -Prescribe acetaminophen-hydrocodone 5-325 mg Q 6hr (120 pills with no refills) PRN pain -Continue methocarbamol 750 mg TID for muscle spasms

## 2013-12-05 ENCOUNTER — Other Ambulatory Visit: Payer: Self-pay | Admitting: *Deleted

## 2013-12-05 DIAGNOSIS — M545 Low back pain: Secondary | ICD-10-CM

## 2013-12-05 NOTE — Telephone Encounter (Signed)
Pt # [phone]

## 2013-12-06 MED ORDER — HYDROCODONE-ACETAMINOPHEN 5-325 MG PO TABS: 1.0000 | ORAL_TABLET | Freq: Four times a day (QID) | ORAL | Status: DC | PRN

## 2013-12-06 NOTE — Telephone Encounter (Signed)
Pt aware Rx is ready. Hilda Blades Ladaja Yusupov RN 12/06/13 8:45AM

## 2014-01-01 ENCOUNTER — Encounter: Payer: Self-pay | Admitting: Internal Medicine

## 2014-01-31 ENCOUNTER — Encounter: Payer: Self-pay | Admitting: Family Medicine

## 2014-02-01 ENCOUNTER — Other Ambulatory Visit: Payer: Self-pay | Admitting: *Deleted

## 2014-02-01 DIAGNOSIS — M545 Low back pain: Secondary | ICD-10-CM

## 2014-02-01 MED ORDER — HYDROCODONE-ACETAMINOPHEN 5-325 MG PO TABS: 1.0000 | ORAL_TABLET | Freq: Four times a day (QID) | ORAL | Status: DC | PRN

## 2014-02-01 NOTE — Telephone Encounter (Signed)
Pt informed Rx is ready 

## 2014-02-01 NOTE — Telephone Encounter (Signed)
Last refill 10/7 Pt # [phone]

## 2014-02-27 ENCOUNTER — Ambulatory Visit (INDEPENDENT_AMBULATORY_CARE_PROVIDER_SITE_OTHER): Payer: Medicaid Other | Admitting: Internal Medicine

## 2014-02-27 ENCOUNTER — Encounter: Payer: Self-pay | Admitting: Internal Medicine

## 2014-02-27 VITALS — BP 118/61 | HR 87 | Temp 98.0°F | Ht 63.0 in | Wt 82.4 lb

## 2014-02-27 DIAGNOSIS — G8929 Other chronic pain: Secondary | ICD-10-CM

## 2014-02-27 DIAGNOSIS — M5441 Lumbago with sciatica, right side: Secondary | ICD-10-CM

## 2014-02-27 NOTE — Progress Notes (Signed)
Reviewed MRI lumbar spine 2013 which shows right S1 nerve root encroachment (touches right S1 nerve root but no sacral nerve root displacement or compression). Would advise urgent MRI lumbar spine. Right sided unilateral leg weakness with no sensory deficits, no hyperreflexia - unlikely cord compression, appears to be related to sciatica and L5S1 disc protrusion. If MRI cannot be done via pre-authorization, in the next few days, would advise calling patient in to the ER to get it done and referral to neurosurgery.    Case discussed with Dr. Alice Rieger soon after the resident saw the patient.  We reviewed the resident's history and exam and pertinent patient test results.  I agree with the assessment, diagnosis, and plan of care documented in the resident's note.

## 2014-02-27 NOTE — Patient Instructions (Signed)
General Instructions: I will put in a referral to neurosurgery  Please take an extra pill at bed time Please see dr Naaman Plummer in a month  Thank you for bringing your medicines today. This helps [place] keep you safe from mistakes.   Progress Toward Treatment Goals:  No flowsheet data found.  Self Care Goals & Plans:  Self Care Goal 02/27/2014  Manage my medications take my medicines as prescribed; bring my medications to every visit; refill my medications on time  Eat healthy foods eat more vegetables; eat foods that are low in salt; eat baked foods instead of fried foods  Be physically active find an activity I enjoy    No flowsheet data found.   Care Management & Community Referrals:  No flowsheet data found.

## 2014-02-27 NOTE — Progress Notes (Signed)
Patient ID: Melissa Walker, female   DOB: [birth year], 33 y.o.   MRN: [id number]   Subjective:   HPI: [name] is a 33 y.o. woman with past medical history of low back pain who presents for follow-up visit.  Reason(s) for this visit: Chronic Low back pain: Patient has a long history of low back pain. She presents today complaining of increased pain in her back, despite taking Robaxin and Vicodin. She also states that she has increased pain in the right lower extremity, which is throbbing pain that shoots down her entire right leg. She states that her pain is mostly severe at the rate of 8/10 and does not improve significantly with her current Vicodin dose. She is frequently unable to sleep due to pain. She has noticed increased pain in her right-sided leg and has been more severe over the last 6 weeks. She has no history of falls. She reports numbness when she cross her right leg for a few minutes. She has no loss of bladder or bowel control. She denies pain in the left leg. MRI in 2013, revealed "Stable right paracentral disc protrusion at L5-S1 with right S1 nerve root encroachment but no displacement" . Referral to pain management, neurosurgery or orthopedic management has been complicated due to lack of health insurance. She has Medicaid currently.  She has a 40 months old baby, but she does not breast-feed.  ROS: Constitutional: Denies fever, chills, diaphoresis, appetite change and fatigue.  Respiratory: Denies SOB, DOE, cough, chest tightness, and wheezing. Denies chest pain. CVS: No chest pain, palpitations and leg swelling.  GI: No abdominal pain, nausea, vomiting, bloody stools GU: No dysuria, frequency, hematuria, or flank pain.  MSK: No myalgias, joint swelling, arthralgias  Psych: No depression symptoms. No SI or SA.    Objective:  Physical Exam: Filed Vitals:   02/27/14 1356  BP: 118/61  Pulse: 87  Temp: 98 F (36.7 C)  TempSrc: Oral  Height: 5\' 3"  (1.6 m)    Weight: 82 lb 6.4 oz (37.376 kg)  SpO2: 99%   General: Well nourished. No acute distress.  HEENT: Normal oral mucosa. MMM.  Lungs: CTA bilaterally. Heart: RRR; no extra sounds or murmurs  Abdomen: Non-distended, normal bowel sounds, soft, nontender; no hepatosplenomegaly  Extremities: No pedal edema. No joint swelling or tenderness. Back: tenderness in the lower back mostly in L5/S1 region Neurologic: Normal EOM,  Alert and oriented x3. Power is 4/5 in the right LE. Normal in the left LE. Upper extremities with good strength bilaterally.  She reports sensation in the lateral side of her leg Straight leg raising is positive on the right side Reflexes are normal bilaterally   Assessment & Plan:  Discussed case with my attending in the clinic, Dr. Ellwood Dense  See problem based charting.

## 2014-02-28 LAB — PRESCRIPTION ABUSE MONITORING 15P, URINE
Amphetamine/Meth: NEGATIVE ng/mL
BARBITURATE SCREEN, URINE: NEGATIVE ng/mL
Benzodiazepine Screen, Urine: NEGATIVE ng/mL
Buprenorphine, Urine: NEGATIVE ng/mL
Cannabinoid Scrn, Ur: NEGATIVE ng/mL
Carisoprodol, Urine: NEGATIVE ng/mL
Cocaine Metabolites: NEGATIVE ng/mL
Creatinine, Urine: 30.68 mg/dL (ref >20.0)
Fentanyl, Ur: NEGATIVE ng/mL
MEPERIDINE UR: NEGATIVE ng/mL
Methadone Screen, Urine: NEGATIVE ng/mL
Oxycodone Screen, Ur: NEGATIVE ng/mL
PROPOXYPHENE: NEGATIVE ng/mL
Tramadol Scrn, Ur: NEGATIVE ng/mL
Zolpidem, Urine: NEGATIVE ng/mL

## 2014-02-28 NOTE — Assessment & Plan Note (Addendum)
Patient's history of subacute worsening of her right lower extremity weakness with intermittent numbness is concerning for progression of her nerve compression which was seen previously on MRI in 2013 (reported as "Stable right paracentral disc protrusion at L5-S1 with right S1nerve root encroachment but no displacement). She also reports that her current pain regimen has not been effective. Indeed, physical examination is evident for slight reduction in the strength of her right lower extremity but without sensory deficits, and the reflexes are normal. Her distribution of pain is consistent with the imaging findings 2 years ago. I do not suspect acute spinal cord compression at this time. Evidently, she would benefit from a more urgent evaluation by neurosurgery or orthopedic surgery with a question of whether surgery would be considered in her case to mitigate long-term complications given her young age. Due to her marginal health insurance coverage, it has been challenging to complete these referrals. I attempted to make a doctor to doctor phone call with a neurosurgeon to discuss the case, but there was no call back.  Plan -We will pursue urgent lumbar spine MRI. -If the MRI reveals progression of her disease process, rigorous attempts will be made for the patient to obtain urgent neurosurgery evaluation. This might require inpatient admission, which I think would not be unreasonable. -I discussed at length her pain management with the risks and benefits of chronic narcotic  use. She would like to try to increase her nighttime Vicodin to 2 pills. I agreed with this. Will obtain UDS today even though she has not reg flags. -She will follow-up with her primary doctor in not more than a month for further evaluation. If the MRI shows increased abnormalities, patient should be contacted to expedite neurosurgical evaluation. -Counseled the patient about danger signs of acute spinal cord compression and the need  to seek urgent medical in the ER. She verbalized understanding

## 2014-03-01 LAB — OPIATES/OPIOIDS (LC/MS-MS)
Codeine Urine: NEGATIVE ng/mL (ref <50)
HYDROMORPHONE: 97 ng/mL — AB (ref <50)
Hydrocodone: 225 ng/mL — ABNORMAL HIGH (ref <50)
MORPHINE: NEGATIVE ng/mL (ref <50)
NOROXYCODONE, UR: NEGATIVE ng/mL (ref <50)
Norhydrocodone, Ur: 774 ng/mL — ABNORMAL HIGH (ref <50)
OXYCODONE, UR: NEGATIVE ng/mL (ref <50)
Oxymorphone: NEGATIVE ng/mL (ref <50)

## 2014-03-08 NOTE — Progress Notes (Signed)
Working with RN Carlyon Shadow Ulis Rias) to get the patient pre-authorized for MRI. Dr Naaman Plummer or Dr Alice Rieger might be contacted to provide necessary documentation for preauthorization.

## 2014-03-20 ENCOUNTER — Other Ambulatory Visit: Payer: Self-pay | Admitting: *Deleted

## 2014-03-20 DIAGNOSIS — M545 Low back pain: Secondary | ICD-10-CM

## 2014-03-20 MED ORDER — METHOCARBAMOL 500 MG PO TABS: 750.0000 mg | ORAL_TABLET | Freq: Four times a day (QID) | ORAL | Status: DC

## 2014-03-20 MED ORDER — HYDROCODONE-ACETAMINOPHEN 5-325 MG PO TABS: 1.0000 | ORAL_TABLET | Freq: Four times a day (QID) | ORAL | Status: DC | PRN

## 2014-03-20 NOTE — Telephone Encounter (Signed)
Last refill 02/01/14 Call when ready

## 2014-05-03 ENCOUNTER — Other Ambulatory Visit: Payer: Self-pay | Admitting: *Deleted

## 2014-05-03 DIAGNOSIS — M545 Low back pain: Secondary | ICD-10-CM

## 2014-05-03 NOTE — Telephone Encounter (Signed)
Last refilled on 03/20/14

## 2014-05-07 MED ORDER — HYDROCODONE-ACETAMINOPHEN 5-325 MG PO TABS: 1.0000 | ORAL_TABLET | Freq: Four times a day (QID) | ORAL | Status: DC | PRN

## 2014-05-08 NOTE — Telephone Encounter (Signed)
Rx ready, pt informed

## 2014-05-09 ENCOUNTER — Emergency Department (INDEPENDENT_AMBULATORY_CARE_PROVIDER_SITE_OTHER)
Admission: EM | Admit: 2014-05-09 | Discharge: 2014-05-09 | Disposition: A | Discharge status: Home / Self Care | Payer: Medicaid Other | Source: Home / Self Care | Attending: Family Medicine | Admitting: Family Medicine

## 2014-05-09 ENCOUNTER — Encounter (HOSPITAL_COMMUNITY): Payer: Self-pay | Admitting: Emergency Medicine

## 2014-05-09 DIAGNOSIS — L739 Follicular disorder, unspecified: Secondary | ICD-10-CM

## 2014-05-09 MED ORDER — CLINDAMYCIN HCL 300 MG PO CAPS: 300.0000 mg | ORAL_CAPSULE | Freq: Three times a day (TID) | ORAL | Status: DC

## 2014-05-09 NOTE — ED Provider Notes (Signed)
CSN: [id number]     Arrival date & time 05/09/14  1040 History   First MD Initiated Contact with Patient 05/09/14 1123     Chief Complaint  Patient presents with  . Rash   (Consider location/radiation/quality/duration/timing/severity/associated sxs/prior Treatment) HPI  Rash: started 4 wks ago. Started along the back. Pimple like. Puss in center. One along hair line getting bigger. Tried pimple cream w/o benefit. Constant. And getting worse. Denies chest pain, fevers, shortness of breath, palpitations, fevers, nausea, vomiting, diarrhea difficult swallowing or breathing.   Past Medical History  Diagnosis Date  . Neuromuscular disorder   . Tobacco abuse   . Chronic back pain   . Abnormal uterine bleeding 02/04/2012    Occurred 01/12/12-01/15/12. Evaluated in MAU and all labs, exam, and pelvic u/s WNL. Given rx for ibuprofen and told to follow up as otupatient.     Past Surgical History  Procedure Laterality Date  . Dental rehabilitation      full set dentures  . Multiple epidural steroid injections in back      Over past 2 years  . Tubal ligation Bilateral 05/11/2013    Procedure: POST PARTUM TUBAL LIGATION;  Surgeon: Osborne Oman, MD;  Location: [facility];  Service: Gynecology;  Laterality: Bilateral;   Family History  Problem Relation Age of Onset  . Eclampsia Maternal Grandmother   . Heart attack Maternal Grandmother   . Cancer Maternal Grandmother     lung  . Heart disease Maternal Grandmother   . Hyperlipidemia Maternal Grandmother   . Hypertension Maternal Grandmother   . Cancer Father     brain tumor    History  Substance Use Topics  . Smoking status: Current Every Day Smoker -- 0.50 packs/day    Types: Cigarettes  . Smokeless tobacco: Never Used     Comment: TRYING TO QUIT/ LESS THAN 1/2 PPD  . Alcohol Use: No     Comment: occasional   OB History    Gravida Para Term Preterm AB TAB SAB Ectopic Multiple Living   2 2 2       2      Review of Systems Per HPI  with all other pertinent systems negative.   Allergies  Penicillins and Nicotine  Home Medications   Prior to Admission medications   Medication Sig Start Date End Date Taking? Authorizing Provider  HYDROcodone-acetaminophen (NORCO) 5-325 MG per tablet Take 1 tablet by mouth every 6 (six) hours as needed for moderate pain. 05/07/14  Yes Juluis Mire, MD  methocarbamol (ROBAXIN) 500 MG tablet Take 1.5 tablets (750 mg total) by mouth 4 (four) times daily. 03/20/14  Yes Juluis Mire, MD  clindamycin (CLEOCIN) 300 MG capsule Take 1 capsule (300 mg total) by mouth 3 (three) times daily. 05/09/14   Waldemar Dickens, MD  Prenatal Vit-Fe Fumarate-FA (PRENATAL MULTIVITAMIN) TABS tablet Take 1 tablet by mouth daily at 12 noon.    Historical Provider, MD   BP 141/88 mmHg  Pulse 88  Temp(Src) 97.2 F (36.2 C) (Oral)  Resp 18  SpO2 97%  LMP 04/16/2014 Physical Exam  Constitutional: She is oriented to person, place, and time. She appears well-developed and well-nourished. No distress.  HENT:  Head: Normocephalic and atraumatic.  Eyes: EOM are normal. Pupils are equal, round, and reactive to light.  Neck: Normal range of motion.  Left posterior cervical lymphadenopathy  Cardiovascular: Normal rate.   No murmur heard. Pulmonary/Chest: Effort normal and breath sounds normal.  Abdominal: Soft. She exhibits no distension.  Musculoskeletal: Normal range of motion. She exhibits no tenderness.  Neurological: She is alert and oriented to person, place, and time.  Skin: She is not diaphoretic.  Numerous hair follicles with central pustule with surrounding erythema across the upper back and neck. Largest lesion approximately 1 x 0.5 cm along the hairline of the back right side of the neck.    ED Course  Procedures (including critical care time) Labs Review Labs Reviewed - No data to display  Imaging Review No results found.   MDM   1. Folliculitis    Start clindamycin Discussed basic skin  hygiene Precautions given and all questions answered  ,Linna Darner, MD Family Medicine 05/09/2014, 12:04 PM      Waldemar Dickens, MD 05/09/14 [phone]

## 2014-05-09 NOTE — Discharge Instructions (Signed)
You have a bacterial skin infection called folliculitis. This will be easily treated with antibiotics. Please continued use an antibacterial soap everyday. Please consider using an antibacterial ointment in the future when this problem first starts. Please call us back if her some reason this does not improve.

## 2014-05-09 NOTE — ED Notes (Signed)
C/o rash on back on neck that's spreading down to the back and arms onset 2 weeks Denies fevers, chills, cold sx Alert, no signs of acute distress.

## 2014-06-07 ENCOUNTER — Ambulatory Visit (HOSPITAL_COMMUNITY)
Admission: RE | Admit: 2014-06-07 | Discharge: 2014-06-07 | Disposition: A | Discharge status: Home / Self Care | Payer: Medicaid Other | Source: Ambulatory Visit | Attending: Internal Medicine | Admitting: Internal Medicine

## 2014-06-07 DIAGNOSIS — M5441 Lumbago with sciatica, right side: Secondary | ICD-10-CM | POA: Diagnosis not present

## 2014-06-07 DIAGNOSIS — M5117 Intervertebral disc disorders with radiculopathy, lumbosacral region: Secondary | ICD-10-CM | POA: Diagnosis not present

## 2014-06-08 ENCOUNTER — Telehealth: Payer: Self-pay | Admitting: Internal Medicine

## 2014-06-08 NOTE — Telephone Encounter (Signed)
I contacted the patient via the phone regarding her spine MRI results of 06/07/2014 which showed "Stable appearing central and slightly right paracentral disc protrusion at L5-S1 with potential irritation of the right S1 nerve root". She has an appointment with Dr Rita Ohara of neurosurgery on Tuesday 06/12/2014 to discuss further management for her pain. She reports that her legs still gets numb intermittently and it is still painful. I encouraged her to call [facility] and make an appointment with her PCP if needed. She agrees and appreciated the call. I will forward this message to her PCP.

## 2014-06-13 ENCOUNTER — Other Ambulatory Visit: Payer: Self-pay | Admitting: *Deleted

## 2014-06-13 DIAGNOSIS — M545 Low back pain: Secondary | ICD-10-CM

## 2014-06-13 NOTE — Telephone Encounter (Signed)
Last refill 3/8

## 2014-06-15 MED ORDER — HYDROCODONE-ACETAMINOPHEN 5-325 MG PO TABS: 1.0000 | ORAL_TABLET | Freq: Four times a day (QID) | ORAL | Status: DC | PRN

## 2014-06-19 ENCOUNTER — Telehealth: Payer: Self-pay | Admitting: *Deleted

## 2014-06-19 NOTE — Telephone Encounter (Signed)
done

## 2014-06-19 NOTE — Telephone Encounter (Signed)
Pt called for about one week problems with sinus congestion, running nose and yellow productive coug. Temp late in day 99.9.  Doing Sudafed and Robitussin. Prefers not to come in at this time due to 35 year old child. Suggest NS spray. To call if no change or go to Urgent Care. Hilda Blades Lovell Roe RN 06/19/14 4:45PM

## 2014-06-27 ENCOUNTER — Other Ambulatory Visit: Payer: Self-pay | Admitting: Neurosurgery

## 2014-06-27 DIAGNOSIS — M5416 Radiculopathy, lumbar region: Secondary | ICD-10-CM

## 2014-07-05 ENCOUNTER — Ambulatory Visit
Admission: RE | Admit: 2014-07-05 | Discharge: 2014-07-05 | Disposition: A | Discharge status: Home / Self Care | Payer: Medicaid Other | Source: Ambulatory Visit | Attending: Neurosurgery | Admitting: Neurosurgery

## 2014-07-05 ENCOUNTER — Other Ambulatory Visit: Payer: Self-pay | Admitting: Neurosurgery

## 2014-07-05 DIAGNOSIS — M5416 Radiculopathy, lumbar region: Secondary | ICD-10-CM

## 2014-07-05 MED ORDER — IOHEXOL 180 MG/ML  SOLN
1.0000 mL | Freq: Once | INTRAMUSCULAR | Status: AC | PRN
  Administered 2014-07-05: 1 mL via EPIDURAL

## 2014-07-05 MED ORDER — METHYLPREDNISOLONE ACETATE 40 MG/ML INJ SUSP (RADIOLOG
120.0000 mg | Freq: Once | INTRAMUSCULAR | Status: AC
  Administered 2014-07-05: 120 mg via EPIDURAL

## 2014-07-05 NOTE — Progress Notes (Signed)
Patient discharged with no problems. Patient's mother to transport home. Discharge instructions provided. Charlett Lango

## 2014-07-05 NOTE — Discharge Instructions (Signed)

## 2014-07-17 ENCOUNTER — Other Ambulatory Visit: Payer: Self-pay | Admitting: Neurosurgery

## 2014-07-20 ENCOUNTER — Encounter (HOSPITAL_COMMUNITY)
Admission: RE | Admit: 2014-07-20 | Discharge: 2014-07-20 | Disposition: A | Discharge status: Home / Self Care | Payer: Medicaid Other | Source: Ambulatory Visit | Attending: Neurosurgery | Admitting: Neurosurgery

## 2014-07-20 ENCOUNTER — Encounter (HOSPITAL_COMMUNITY): Payer: Self-pay

## 2014-07-20 LAB — BASIC METABOLIC PANEL
Anion gap: 7 (ref 5–15)
BUN: 5 mg/dL — AB (ref 6–20)
CALCIUM: 9.7 mg/dL (ref 8.9–10.3)
CO2: 29 mmol/L (ref 22–32)
Chloride: 104 mmol/L (ref 101–111)
Creatinine, Ser: 0.68 mg/dL (ref 0.44–1.00)
GFR calc Af Amer: >60 mL/min (ref >60)
GFR calc non Af Amer: >60 mL/min (ref >60)
GLUCOSE: 90 mg/dL (ref 65–99)
Potassium: 4.3 mmol/L (ref 3.5–5.1)
Sodium: 140 mmol/L (ref 135–145)

## 2014-07-20 LAB — CBC
HEMATOCRIT: 44.5 % (ref 36.0–46.0)
Hemoglobin: 15.1 g/dL — ABNORMAL HIGH (ref 12.0–15.0)
MCH: 33.4 pg (ref 26.0–34.0)
MCHC: 33.9 g/dL (ref 30.0–36.0)
MCV: 98.5 fL (ref 78.0–100.0)
Platelets: 389 10*3/uL (ref 150–400)
RBC: 4.52 MIL/uL (ref 3.87–5.11)
RDW: 13.1 % (ref 11.5–15.5)
WBC: 10.7 10*3/uL — ABNORMAL HIGH (ref 4.0–10.5)

## 2014-07-20 LAB — SURGICAL PCR SCREEN
MRSA, PCR: NEGATIVE
Staphylococcus aureus: POSITIVE — AB

## 2014-07-20 LAB — HCG, SERUM, QUALITATIVE: Preg, Serum: NEGATIVE

## 2014-07-20 NOTE — Pre-Procedure Instructions (Signed)
    Melissa Walker  07/20/2014      [facility] [place], [place]. [place]. [place] [address] Phone: [phone] Fax: [place] [address] Phone: [phone] Fax: [phone]    Your procedure is scheduled on  Monday, May 23.   Report to [facility] Admitting at  A.M.10:00  Call this number if you have problems the morning of surgery:  [phone]   Remember:  Do not eat food or drink liquids after midnight Sunday, May 22.  Take these medicines the morning of surgery with A SIP OF WATER If needed: Hydrocodone- Acetaminophen , Methocarbaramol (Robaxin)               Stop taking Aspirin, Coumadin, Plavix, Effient and Herbal medications.  Do not take any NSAIDs ie: Ibuprofen,  Advil,Naproxen or any medication containing Aspirin.   Do not wear jewelry, make-up or nail polish.  Do not wear lotions, powders, or perfumes.    Do not shave 48 hours prior to surgery.    Do not bring valuables to the hospital.  [facility] is not responsible for any belongings or valuables.  Contacts, dentures or bridgework may not be worn into surgery.  Leave your suitcase in the car.  After surgery it may be brought to your room.  For patients admitted to the hospital, discharge time will be determined by your treatment team.  Patients discharged the day of surgery will not be allowed to drive home.  Name and phone number of your driver:   - to be determined  Special instructions:  Review  [place] - Preparing For Surgery.  Please read over the following fact sheets that you were given. Pain Booklet, Coughing and Deep Breathing and Surgical Site Infection Prevention

## 2014-07-20 NOTE — Progress Notes (Signed)
I called a prescription for Mupirocin ointment to [facility], [place].

## 2014-07-20 NOTE — Pre-Procedure Instructions (Addendum)
    Melissa Walker  07/20/2014      [facility] [place], [place]. [place]. [place] [address] Phone: [phone] Fax: [place] [address] Phone: [phone] Fax: [phone]    Your procedure is scheduled on  Monday, May 23.   Report to [facility] Admitting at  A.M.10:00  Call this number if you have problems the morning of surgery:  [phone]   Remember:  Do not eat food or drink liquids after midnight Sunday, May 22.  Take these medicines the morning of surgery with A SIP OF WATER If needed: Hydrocodone- Acetaminophen,                Stop taking Aspirin, Coumadin, Plavix, Effient and Herbal medications.  Do not take any NSAIDs ie: Ibuprofen,  Advil,Naproxen or any medication containing Aspirin.   Do not wear jewelry, make-up or nail polish.  Do not wear lotions, powders, or perfumes.    Do not shave 48 hours prior to surgery.    Do not bring valuables to the hospital.  [facility] is not responsible for any belongings or valuables.  Contacts, dentures or bridgework may not be worn into surgery.  Leave your suitcase in the car.  After surgery it may be brought to your room.  For patients admitted to the hospital, discharge time will be determined by your treatment team.  Patients discharged the day of surgery will not be allowed to drive home.  Name and phone number of your driver:   -  Special instructions:  Review  [place] - Preparing For Surgery.  Please read over the following fact sheets that you were given. Pain Booklet, Coughing and Deep Breathing and Surgical Site Infection Prevention

## 2014-07-22 MED ORDER — VANCOMYCIN HCL IN DEXTROSE 1-5 GM/200ML-% IV SOLN
1000.0000 mg | INTRAVENOUS | Status: AC
  Administered 2014-07-23: 500 mg via INTRAVENOUS
  Filled 2014-07-22: qty 200

## 2014-07-23 ENCOUNTER — Ambulatory Visit (HOSPITAL_COMMUNITY): Payer: Medicaid Other | Admitting: Certified Registered"

## 2014-07-23 ENCOUNTER — Inpatient Hospital Stay (HOSPITAL_COMMUNITY)
Admission: RE | Admit: 2014-07-23 | Discharge: 2014-07-23 | DRG: 520 | Disposition: A | Discharge status: Home / Self Care | Payer: Medicaid Other | Source: Ambulatory Visit | Attending: Neurosurgery | Admitting: Neurosurgery

## 2014-07-23 ENCOUNTER — Ambulatory Visit (HOSPITAL_COMMUNITY): Payer: Medicaid Other

## 2014-07-23 ENCOUNTER — Encounter (HOSPITAL_COMMUNITY): Payer: Self-pay | Admitting: General Practice

## 2014-07-23 ENCOUNTER — Encounter (HOSPITAL_COMMUNITY)
Admission: RE | Disposition: A | Discharge status: Home / Self Care | Payer: Self-pay | Source: Ambulatory Visit | Attending: Neurosurgery

## 2014-07-23 DIAGNOSIS — M545 Low back pain: Secondary | ICD-10-CM | POA: Diagnosis present

## 2014-07-23 DIAGNOSIS — F17210 Nicotine dependence, cigarettes, uncomplicated: Secondary | ICD-10-CM | POA: Diagnosis present

## 2014-07-23 DIAGNOSIS — M4726 Other spondylosis with radiculopathy, lumbar region: Secondary | ICD-10-CM | POA: Diagnosis present

## 2014-07-23 DIAGNOSIS — M5116 Intervertebral disc disorders with radiculopathy, lumbar region: Secondary | ICD-10-CM | POA: Diagnosis present

## 2014-07-23 DIAGNOSIS — Z419 Encounter for procedure for purposes other than remedying health state, unspecified: Secondary | ICD-10-CM

## 2014-07-23 DIAGNOSIS — M5126 Other intervertebral disc displacement, lumbar region: Secondary | ICD-10-CM | POA: Diagnosis present

## 2014-07-23 HISTORY — PX: LUMBAR LAMINECTOMY/DECOMPRESSION MICRODISCECTOMY: SHX5026

## 2014-07-23 SURGERY — LUMBAR LAMINECTOMY/DECOMPRESSION MICRODISCECTOMY 1 LEVEL
Anesthesia: General | Site: Back | Laterality: Right

## 2014-07-23 MED ORDER — ZOLPIDEM TARTRATE 5 MG PO TABS: 5.0000 mg | ORAL_TABLET | Freq: Every evening | ORAL | Status: DC | PRN

## 2014-07-23 MED ORDER — GLYCOPYRROLATE 0.2 MG/ML IJ SOLN
INTRAMUSCULAR | Status: AC
  Filled 2014-07-23: qty 3

## 2014-07-23 MED ORDER — MIDAZOLAM HCL 2 MG/2ML IJ SOLN
INTRAMUSCULAR | Status: AC
  Filled 2014-07-23: qty 2

## 2014-07-23 MED ORDER — NEOSTIGMINE METHYLSULFATE 10 MG/10ML IV SOLN
INTRAVENOUS | Status: AC
  Filled 2014-07-23: qty 1

## 2014-07-23 MED ORDER — METHYLPREDNISOLONE ACETATE 80 MG/ML IJ SUSP
INTRAMUSCULAR | Status: DC | PRN
  Administered 2014-07-23: 80 mg

## 2014-07-23 MED ORDER — METHOCARBAMOL 500 MG PO TABS
750.0000 mg | ORAL_TABLET | Freq: Four times a day (QID) | ORAL | Status: DC | PRN
  Administered 2014-07-23: 750 mg via ORAL
  Filled 2014-07-23 (×2): qty 1

## 2014-07-23 MED ORDER — HYDROMORPHONE HCL 1 MG/ML IJ SOLN
0.2500 mg | INTRAMUSCULAR | Status: DC | PRN
  Administered 2014-07-23 (×2): 0.5 mg via INTRAVENOUS

## 2014-07-23 MED ORDER — GENTAMICIN SULFATE 40 MG/ML IJ SOLN
40.0000 mg | INTRAVENOUS | Status: AC
  Administered 2014-07-23: 40 mg via INTRAVENOUS
  Filled 2014-07-23: qty 1

## 2014-07-23 MED ORDER — ARTIFICIAL TEARS OP OINT
TOPICAL_OINTMENT | OPHTHALMIC | Status: DC | PRN
  Administered 2014-07-23: 1 via OPHTHALMIC

## 2014-07-23 MED ORDER — ONDANSETRON HCL 4 MG/2ML IJ SOLN
INTRAMUSCULAR | Status: DC | PRN
  Administered 2014-07-23: 4 mg via INTRAVENOUS

## 2014-07-23 MED ORDER — PROMETHAZINE HCL 25 MG/ML IJ SOLN
INTRAMUSCULAR | Status: AC
  Filled 2014-07-23: qty 1

## 2014-07-23 MED ORDER — SODIUM CHLORIDE 0.9 % IJ SOLN
3.0000 mL | Freq: Two times a day (BID) | INTRAMUSCULAR | Status: DC
  Administered 2014-07-23: 3 mL via INTRAVENOUS

## 2014-07-23 MED ORDER — MENTHOL 3 MG MT LOZG: 1.0000 | LOZENGE | OROMUCOSAL | Status: DC | PRN

## 2014-07-23 MED ORDER — BISACODYL 10 MG RE SUPP
10.0000 mg | Freq: Every day | RECTAL | Status: DC | PRN
Start: 2014-07-23 — End: 2014-07-23

## 2014-07-23 MED ORDER — CYCLOBENZAPRINE HCL 10 MG PO TABS
10.0000 mg | ORAL_TABLET | Freq: Three times a day (TID) | ORAL | Status: DC | PRN
  Filled 2014-07-23: qty 1

## 2014-07-23 MED ORDER — PROPOFOL 10 MG/ML IV BOLUS
INTRAVENOUS | Status: AC
  Filled 2014-07-23: qty 20

## 2014-07-23 MED ORDER — ACETAMINOPHEN 10 MG/ML IV SOLN
INTRAVENOUS | Status: AC
  Administered 2014-07-23: 1000 mg via INTRAVENOUS
  Filled 2014-07-23: qty 100

## 2014-07-23 MED ORDER — ONDANSETRON HCL 4 MG PO TABS: 4.0000 mg | ORAL_TABLET | Freq: Four times a day (QID) | ORAL | Status: DC | PRN

## 2014-07-23 MED ORDER — ROCURONIUM BROMIDE 100 MG/10ML IV SOLN
INTRAVENOUS | Status: DC | PRN
  Administered 2014-07-23: 40 mg via INTRAVENOUS

## 2014-07-23 MED ORDER — MAGNESIUM HYDROXIDE 400 MG/5ML PO SUSP
30.0000 mL | Freq: Every day | ORAL | Status: DC | PRN
Start: 2014-07-23 — End: 2014-07-23

## 2014-07-23 MED ORDER — PROPOFOL 10 MG/ML IV BOLUS
INTRAVENOUS | Status: DC | PRN
  Administered 2014-07-23: 120 mg via INTRAVENOUS

## 2014-07-23 MED ORDER — HYDROMORPHONE HCL 1 MG/ML IJ SOLN
INTRAMUSCULAR | Status: AC
  Filled 2014-07-23: qty 1

## 2014-07-23 MED ORDER — OXYCODONE-ACETAMINOPHEN 5-325 MG PO TABS
ORAL_TABLET | ORAL | Status: DC
Start: 2014-07-23 — End: 2014-07-23
  Filled 2014-07-23: qty 2

## 2014-07-23 MED ORDER — FENTANYL CITRATE (PF) 100 MCG/2ML IJ SOLN
INTRAMUSCULAR | Status: DC | PRN
  Administered 2014-07-23: 50 ug via INTRAVENOUS
  Administered 2014-07-23: 100 ug via INTRAVENOUS

## 2014-07-23 MED ORDER — METHOCARBAMOL 500 MG PO TABS
ORAL_TABLET | ORAL | Status: AC
  Filled 2014-07-23: qty 1

## 2014-07-23 MED ORDER — OXYCODONE-ACETAMINOPHEN 5-325 MG PO TABS: 1.0000 | ORAL_TABLET | ORAL | Status: DC | PRN

## 2014-07-23 MED ORDER — HYDROXYZINE HCL 25 MG PO TABS: 50.0000 mg | ORAL_TABLET | ORAL | Status: DC | PRN

## 2014-07-23 MED ORDER — HEMOSTATIC AGENTS (NO CHARGE) OPTIME
TOPICAL | Status: DC | PRN
  Administered 2014-07-23: 1 via TOPICAL

## 2014-07-23 MED ORDER — OXYCODONE-ACETAMINOPHEN 5-325 MG PO TABS
1.0000 | ORAL_TABLET | ORAL | Status: DC | PRN
  Administered 2014-07-23 (×2): 2 via ORAL
  Filled 2014-07-23 (×2): qty 2

## 2014-07-23 MED ORDER — LACTATED RINGERS IV SOLN
INTRAVENOUS | Status: DC
  Administered 2014-07-23: 09:00:00 via INTRAVENOUS

## 2014-07-23 MED ORDER — HYDROXYZINE HCL 50 MG/ML IM SOLN
50.0000 mg | INTRAMUSCULAR | Status: DC | PRN
  Filled 2014-07-23: qty 1

## 2014-07-23 MED ORDER — ALUM & MAG HYDROXIDE-SIMETH 200-200-20 MG/5ML PO SUSP
30.0000 mL | Freq: Four times a day (QID) | ORAL | Status: DC | PRN
Start: 2014-07-23 — End: 2014-07-23

## 2014-07-23 MED ORDER — KETOROLAC TROMETHAMINE 30 MG/ML IJ SOLN
30.0000 mg | Freq: Four times a day (QID) | INTRAMUSCULAR | Status: DC
  Administered 2014-07-23: 30 mg via INTRAVENOUS
  Filled 2014-07-23: qty 1

## 2014-07-23 MED ORDER — HYDROCODONE-ACETAMINOPHEN 5-325 MG PO TABS: 1.0000 | ORAL_TABLET | ORAL | Status: DC | PRN

## 2014-07-23 MED ORDER — SODIUM CHLORIDE 0.9 % IR SOLN
Status: DC | PRN
  Administered 2014-07-23: 12:00:00

## 2014-07-23 MED ORDER — GLYCOPYRROLATE 0.2 MG/ML IJ SOLN
INTRAMUSCULAR | Status: DC | PRN
  Administered 2014-07-23: 0.4 mg via INTRAVENOUS

## 2014-07-23 MED ORDER — KCL IN DEXTROSE-NACL 20-5-0.45 MEQ/L-%-% IV SOLN
INTRAVENOUS | Status: DC
  Filled 2014-07-23 (×2): qty 1000

## 2014-07-23 MED ORDER — ACETAMINOPHEN 650 MG RE SUPP: 650.0000 mg | RECTAL | Status: DC | PRN

## 2014-07-23 MED ORDER — BUPIVACAINE HCL (PF) 0.5 % IJ SOLN
INTRAMUSCULAR | Status: DC | PRN
  Administered 2014-07-23: 5 mL

## 2014-07-23 MED ORDER — THROMBIN 5000 UNITS EX SOLR
CUTANEOUS | Status: DC | PRN
  Administered 2014-07-23 (×2): 5000 [IU] via TOPICAL

## 2014-07-23 MED ORDER — SODIUM CHLORIDE 0.9 % IV SOLN
8.0000 mg | Freq: Four times a day (QID) | INTRAVENOUS | Status: DC | PRN
Start: 2014-07-23 — End: 2014-07-23

## 2014-07-23 MED ORDER — NEOSTIGMINE METHYLSULFATE 10 MG/10ML IV SOLN
INTRAVENOUS | Status: DC | PRN
  Administered 2014-07-23: 3 mg via INTRAVENOUS

## 2014-07-23 MED ORDER — KETOROLAC TROMETHAMINE 30 MG/ML IJ SOLN
30.0000 mg | Freq: Once | INTRAMUSCULAR | Status: AC
  Administered 2014-07-23: 30 mg via INTRAVENOUS

## 2014-07-23 MED ORDER — LIDOCAINE-EPINEPHRINE 1 %-1:100000 IJ SOLN
INTRAMUSCULAR | Status: DC | PRN
  Administered 2014-07-23: 5 mL

## 2014-07-23 MED ORDER — KETOROLAC TROMETHAMINE 30 MG/ML IJ SOLN
INTRAMUSCULAR | Status: AC
  Filled 2014-07-23: qty 1

## 2014-07-23 MED ORDER — SODIUM CHLORIDE 0.9 % IJ SOLN: 3.0000 mL | INTRAMUSCULAR | Status: DC | PRN

## 2014-07-23 MED ORDER — FENTANYL CITRATE (PF) 100 MCG/2ML IJ SOLN
INTRAMUSCULAR | Status: DC | PRN
  Administered 2014-07-23: 100 ug via INTRAVENOUS

## 2014-07-23 MED ORDER — PHENOL 1.4 % MT LIQD: 1.0000 | OROMUCOSAL | Status: DC | PRN

## 2014-07-23 MED ORDER — ACETAMINOPHEN 325 MG PO TABS: 650.0000 mg | ORAL_TABLET | ORAL | Status: DC | PRN

## 2014-07-23 MED ORDER — FENTANYL CITRATE (PF) 250 MCG/5ML IJ SOLN
INTRAMUSCULAR | Status: AC
  Filled 2014-07-23: qty 5

## 2014-07-23 MED ORDER — LACTATED RINGERS IV SOLN
INTRAVENOUS | Status: DC | PRN
  Administered 2014-07-23: 11:00:00 via INTRAVENOUS

## 2014-07-23 MED ORDER — MORPHINE SULFATE 4 MG/ML IJ SOLN: 4.0000 mg | INTRAMUSCULAR | Status: DC | PRN

## 2014-07-23 MED ORDER — 0.9 % SODIUM CHLORIDE (POUR BTL) OPTIME
TOPICAL | Status: DC | PRN
  Administered 2014-07-23: 1000 mL

## 2014-07-23 MED ORDER — MIDAZOLAM HCL 5 MG/5ML IJ SOLN
INTRAMUSCULAR | Status: DC | PRN
  Administered 2014-07-23 (×2): 1 mg via INTRAVENOUS

## 2014-07-23 MED ORDER — MUPIROCIN 2 % EX OINT: TOPICAL_OINTMENT | Freq: Two times a day (BID) | CUTANEOUS | Status: DC

## 2014-07-23 MED ORDER — ONDANSETRON HCL 4 MG/2ML IJ SOLN: 4.0000 mg | Freq: Four times a day (QID) | INTRAMUSCULAR | Status: DC | PRN

## 2014-07-23 MED ORDER — EPHEDRINE SULFATE 50 MG/ML IJ SOLN
INTRAMUSCULAR | Status: AC
  Filled 2014-07-23: qty 1

## 2014-07-23 MED ORDER — FENTANYL CITRATE (PF) 100 MCG/2ML IJ SOLN
INTRAMUSCULAR | Status: DC
Start: 2014-07-23 — End: 2014-07-23
  Filled 2014-07-23: qty 2

## 2014-07-23 MED ORDER — PROMETHAZINE HCL 25 MG/ML IJ SOLN
6.2500 mg | INTRAMUSCULAR | Status: DC | PRN
Start: 2014-07-23 — End: 2014-07-23
  Administered 2014-07-23: 6.25 mg via INTRAVENOUS

## 2014-07-23 MED ORDER — ONDANSETRON HCL 4 MG/2ML IJ SOLN
4.0000 mg | Freq: Four times a day (QID) | INTRAMUSCULAR | Status: DC | PRN
Start: 2014-07-23 — End: 2014-07-23

## 2014-07-23 SURGICAL SUPPLY — 68 items
ADH SKN CLS APL DERMABOND .7 (GAUZE/BANDAGES/DRESSINGS) ×1
APL SKNCLS STERI-STRIP NONHPOA (GAUZE/BANDAGES/DRESSINGS)
BAG DECANTER FOR FLEXI CONT (MISCELLANEOUS) ×2 IMPLANT
BENZOIN TINCTURE PRP APPL 2/3 (GAUZE/BANDAGES/DRESSINGS) IMPLANT
BLADE CLIPPER SURG (BLADE) IMPLANT
BRUSH SCRUB EZ PLAIN DRY (MISCELLANEOUS) ×2 IMPLANT
BUR ACORN 6.0 ACORN (BURR) IMPLANT
BUR ACRON 5.0MM COATED (BURR) IMPLANT
BUR MATCHSTICK NEURO 3.0 LAGG (BURR) ×2 IMPLANT
CANISTER SUCT 3000ML PPV (MISCELLANEOUS) ×2 IMPLANT
CONT SPEC 4OZ CLIKSEAL STRL BL (MISCELLANEOUS) ×1 IMPLANT
DERMABOND ADVANCED (GAUZE/BANDAGES/DRESSINGS) ×1
DERMABOND ADVANCED .7 DNX12 (GAUZE/BANDAGES/DRESSINGS) IMPLANT
DRAPE LAPAROTOMY 100X72X124 (DRAPES) ×2 IMPLANT
DRAPE MICROSCOPE LEICA (MISCELLANEOUS) ×2 IMPLANT
DRAPE POUCH INSTRU U-SHP 10X18 (DRAPES) ×2 IMPLANT
DRAPE PROXIMA HALF (DRAPES) ×1 IMPLANT
DRSG EMULSION OIL 3X3 NADH (GAUZE/BANDAGES/DRESSINGS) IMPLANT
ELECT REM PT RETURN 9FT ADLT (ELECTROSURGICAL) ×2
ELECTRODE REM PT RTRN 9FT ADLT (ELECTROSURGICAL) ×1 IMPLANT
GAUZE SPONGE 4X4 12PLY STRL (GAUZE/BANDAGES/DRESSINGS) ×2 IMPLANT
GAUZE SPONGE 4X4 16PLY XRAY LF (GAUZE/BANDAGES/DRESSINGS) IMPLANT
GLOVE BIOGEL PI IND STRL 6.5 (GLOVE) IMPLANT
GLOVE BIOGEL PI IND STRL 7.5 (GLOVE) IMPLANT
GLOVE BIOGEL PI IND STRL 8 (GLOVE) ×1 IMPLANT
GLOVE BIOGEL PI IND STRL 8.5 (GLOVE) IMPLANT
GLOVE BIOGEL PI INDICATOR 6.5 (GLOVE) ×1
GLOVE BIOGEL PI INDICATOR 7.5 (GLOVE) ×2
GLOVE BIOGEL PI INDICATOR 8 (GLOVE) ×1
GLOVE BIOGEL PI INDICATOR 8.5 (GLOVE) ×1
GLOVE ECLIPSE 6.5 STRL STRAW (GLOVE) ×1 IMPLANT
GLOVE ECLIPSE 7.5 STRL STRAW (GLOVE) ×2 IMPLANT
GLOVE ECLIPSE 8.5 STRL (GLOVE) ×1 IMPLANT
GLOVE EXAM NITRILE LRG STRL (GLOVE) IMPLANT
GLOVE EXAM NITRILE MD LF STRL (GLOVE) IMPLANT
GLOVE EXAM NITRILE XL STR (GLOVE) IMPLANT
GLOVE EXAM NITRILE XS STR PU (GLOVE) IMPLANT
GOWN STRL REUS W/ TWL LRG LVL3 (GOWN DISPOSABLE) ×1 IMPLANT
GOWN STRL REUS W/ TWL XL LVL3 (GOWN DISPOSABLE) IMPLANT
GOWN STRL REUS W/TWL 2XL LVL3 (GOWN DISPOSABLE) ×2 IMPLANT
GOWN STRL REUS W/TWL LRG LVL3 (GOWN DISPOSABLE) ×6
GOWN STRL REUS W/TWL XL LVL3 (GOWN DISPOSABLE) ×2
KIT BASIN OR (CUSTOM PROCEDURE TRAY) ×2 IMPLANT
KIT ROOM TURNOVER OR (KITS) ×2 IMPLANT
NDL SPNL 18GX3.5 QUINCKE PK (NEEDLE) ×1 IMPLANT
NDL SPNL 22GX3.5 QUINCKE BK (NEEDLE) ×1 IMPLANT
NEEDLE HYPO 18GX1.5 BLUNT FILL (NEEDLE) ×2 IMPLANT
NEEDLE SPNL 18GX3.5 QUINCKE PK (NEEDLE) ×2 IMPLANT
NEEDLE SPNL 22GX3.5 QUINCKE BK (NEEDLE) ×2 IMPLANT
NS IRRIG 1000ML POUR BTL (IV SOLUTION) ×2 IMPLANT
PACK LAMINECTOMY NEURO (CUSTOM PROCEDURE TRAY) ×2 IMPLANT
PAD ARMBOARD 7.5X6 YLW CONV (MISCELLANEOUS) ×6 IMPLANT
PATTIES SURGICAL .5 X1 (DISPOSABLE) ×1 IMPLANT
RUBBERBAND STERILE (MISCELLANEOUS) ×4 IMPLANT
SPONGE LAP 4X18 X RAY DECT (DISPOSABLE) IMPLANT
SPONGE SURGIFOAM ABS GEL SZ50 (HEMOSTASIS) ×2 IMPLANT
STRIP CLOSURE SKIN 1/2X4 (GAUZE/BANDAGES/DRESSINGS) IMPLANT
SUT PROLENE 6 0 BV (SUTURE) IMPLANT
SUT VIC AB 1 CT1 18XBRD ANBCTR (SUTURE) ×1 IMPLANT
SUT VIC AB 1 CT1 8-18 (SUTURE)
SUT VIC AB 2-0 CP2 18 (SUTURE) ×2 IMPLANT
SUT VIC AB 3-0 SH 8-18 (SUTURE) ×1 IMPLANT
SYR 20ML ECCENTRIC (SYRINGE) ×2 IMPLANT
SYR 5ML LL (SYRINGE) ×2 IMPLANT
TAPE CLOTH SURG 4X10 WHT LF (GAUZE/BANDAGES/DRESSINGS) ×1 IMPLANT
TOWEL OR 17X24 6PK STRL BLUE (TOWEL DISPOSABLE) ×2 IMPLANT
TOWEL OR 17X26 10 PK STRL BLUE (TOWEL DISPOSABLE) ×2 IMPLANT
WATER STERILE IRR 1000ML POUR (IV SOLUTION) ×2 IMPLANT

## 2014-07-23 NOTE — Anesthesia Preprocedure Evaluation (Signed)
Anesthesia Evaluation  Patient identified by MRN, date of birth, ID band Patient awake    Reviewed: Allergy & Precautions, NPO status , Patient's Chart, lab work & pertinent test results  Airway Mallampati: II  TM Distance: >3 FB Neck ROM: Full    Dental no notable dental hx.    Pulmonary Current Smoker,  breath sounds clear to auscultation  Pulmonary exam normal       Cardiovascular negative cardio ROS Normal cardiovascular examRhythm:Regular Rate:Normal     Neuro/Psych negative neurological ROS  negative psych ROS   GI/Hepatic negative GI ROS, Neg liver ROS,   Endo/Other  negative endocrine ROS  Renal/GU negative Renal ROS  negative genitourinary   Musculoskeletal negative musculoskeletal ROS (+)   Abdominal   Peds negative pediatric ROS (+)  Hematology negative hematology ROS (+)   Anesthesia Other Findings   Reproductive/Obstetrics negative OB ROS                             Anesthesia Physical Anesthesia Plan  ASA: II  Anesthesia Plan: General   Post-op Pain Management:    Induction: Intravenous  Airway Management Planned: Oral ETT  Additional Equipment:   Intra-op Plan:   Post-operative Plan: Extubation in OR  Informed Consent: I have reviewed the patients History and Physical, chart, labs and discussed the procedure including the risks, benefits and alternatives for the proposed anesthesia with the patient or authorized representative who has indicated his/her understanding and acceptance.   Dental advisory given  Plan Discussed with: CRNA and Surgeon  Anesthesia Plan Comments:         Anesthesia Quick Evaluation

## 2014-07-23 NOTE — Anesthesia Procedure Notes (Signed)
Procedure Name: Intubation Date/Time: 07/23/2014 10:52 AM Performed by: Gaylene Brooks Pre-anesthesia Checklist: Timeout performed, Patient identified, Emergency Drugs available, Suction available and Patient being monitored Patient Re-evaluated:Patient Re-evaluated prior to inductionOxygen Delivery Method: Circle system utilized Preoxygenation: Pre-oxygenation with 100% oxygen Intubation Type: IV induction Ventilation: Mask ventilation without difficulty Laryngoscope Size: Miller and 2 Grade View: Grade I Tube type: Oral Tube size: 7.0 mm Number of attempts: 1 Airway Equipment and Method: Stylet Placement Confirmation: ETT inserted through vocal cords under direct vision,  breath sounds checked- equal and bilateral,  positive ETCO2 and CO2 detector Secured at: 21 cm Tube secured with: Tape Dental Injury: Teeth and Oropharynx as per pre-operative assessment

## 2014-07-23 NOTE — Addendum Note (Signed)
Addendum  created 07/23/14 1334 by Gaylene Brooks, CRNA   Modules edited: Anesthesia Medication Administration

## 2014-07-23 NOTE — Anesthesia Postprocedure Evaluation (Signed)
  Anesthesia Post-op Note  Patient: Melissa Walker  Procedure(s) Performed: Procedure(s) (LRB): Right Lumbar five-Sacral one laminotomy and microdiskectomy (Right)  Patient Location: PACU  Anesthesia Type: General  Level of Consciousness: awake and alert   Airway and Oxygen Therapy: Patient Spontanous Breathing  Post-op Pain: mild  Post-op Assessment: Post-op Vital signs reviewed, Patient's Cardiovascular Status Stable, Respiratory Function Stable, Patent Airway and No signs of Nausea or vomiting  Last Vitals:  Filed Vitals:   07/23/14 1255  BP: 125/67  Pulse: 81  Temp:   Resp: 18    Post-op Vital Signs: stable   Complications: No apparent anesthesia complications

## 2014-07-23 NOTE — Plan of Care (Signed)
Problem: Consults Goal: Diagnosis - Spinal Surgery Outcome: Completed/Met Date Met:  07/23/14 Lumbar Laminectomy (Complex)     

## 2014-07-23 NOTE — Op Note (Signed)
07/23/2014  12:25 PM  PATIENT:  Melissa Walker  34 y.o. female  PRE-OPERATIVE DIAGNOSIS:  Right L5-S1 lumbar herniated disc, lumbar spondylosis, lumbar degenerative disease, lumbar radiculopathy  POST-OPERATIVE DIAGNOSIS:  Right L5-S1 lumbar herniated disc, lumbar spondylosis, lumbar degenerative disease, lumbar radiculopathy  PROCEDURE:  Procedure(s):  Right L5-S1 lumbar laminotomy and microdiscectomy, with micro-section, microsurgical technique, and the operating microscope  SURGEON:  Surgeon(s): Jovita Gamma, MD Kristeen Miss, MD  ASSISTANTS: Kristeen Miss, M.D.  ANESTHESIA:   general  EBL:    less than 25 mL  BLOOD ADMINISTERED:none  COUNT: Correct per nursing staff  DICTATION: Patient was brought to the operating room and placed under general endotracheal anesthesia. Patient was turned to prone position the lumbar region was prepped with Betadine soap and solution and draped in a sterile fashion. The midline was infiltrated with local anesthetic with epinephrine. A localizing x-ray was taken and the L5-S1 level was identified. Midline incision was made over the L5-S1 level and was carried down through the subcutaneous tissue to the lumbar fascia. The lumbar fascia was incised on the right side and the paraspinal muscles were dissected from the spinous processes and lamina in a subperiosteal fashion. Another x-ray was taken and the L5-S1 intralaminar space was identified. The operating microscope was draped and brought into the field provided additional magnification, illumination, and visualization. Laminotomy was performed using the high-speed drill and Kerrison punches. The ligamentum flavum was carefully resected. The underlying thecal sac and nerve root were identified. The disc herniation was identified and the thecal sac and nerve root gently retracted medially. The remaining annular fibers were incised, and the subligamentous disc herniation was mobilized and removed. We  then entered the disc space, and removed additional immediately adjacent degenerative disc material. In the end all loose fragment of disc material removed from the disc space and the epidural space, and good decompression of the thecal sac and nerve root was achieved. Once the discectomy was completed and good decompression of the thecal sac and nerve had been achieved hemostasis was established with the use of bipolar cautery and Gelfoam with thrombin. The Gelfoam was removed and hemostasis confirmed. We then instilled 2 cc of fentanyl and 80 mg of Depo-Medrol into the epidural space. Deep fascia was closed with interrupted undyed 2-0 Vicryl sutures. Scarpa's fascia was closed with interrupted undyed 3-0 Vicryl sutures in the subcutaneous and subcuticular layer were closed with interrupted inverted 3-0 undyed Vicryl sutures. The skin edges were approximated with Dermabond. A dressing of sterile gauze and Hypafix was applied. Following surgery the patient was turned back to a supine position to be reversed from the anesthetic extubated and transferred to the recovery room for further care.   PLAN OF CARE: Admit for overnight observation  PATIENT DISPOSITION:  PACU - hemodynamically stable.   Delay start of Pharmacological VTE agent (>24hrs) due to surgical blood loss or risk of bleeding:  yes

## 2014-07-23 NOTE — H&P (Signed)
Subjective: Patient is a 34 y.o. right-handed white female who is admitted for treatment of symptoms to right L5-S1 lumbar disc herniation.  Patient's been having right lumbar radicular pain, with the worst the pain in the right buttock, extending down to the right posterior thigh and into the right leg. Patient has not responded to nonsurgical management, is therefore admitted for a right L5-S1 lumbar laminotomy and micro-discectomy.    Patient Active Problem List   Diagnosis Date Noted  . LOW BACK PAIN, CHRONIC 04/13/2008  . TOBACCO ABUSE 02/02/2006   Past Medical History  Diagnosis Date  . Neuromuscular disorder   . Tobacco abuse   . Chronic back pain   . Abnormal uterine bleeding 02/04/2012    Occurred 01/12/12-01/15/12. Evaluated in [facility]U and all labs, exam, and pelvic u/s WNL. Given rx for ibuprofen and told to follow up as otupatient.      Past Surgical History  Procedure Laterality Date  . Dental rehabilitation      full set dentures  . Multiple epidural steroid injections in back      Over past 2 years  . Tubal ligation Bilateral 05/11/2013    Procedure: POST PARTUM TUBAL LIGATION;  Surgeon: Osborne Oman, MD;  Location: [facility];  Service: Gynecology;  Laterality: Bilateral;    No prescriptions prior to admission   Allergies  Allergen Reactions  . Penicillins     REACTION: uncle highly allergic so was told whole family should not take it  . Nicotine Rash    Nicotine PATCH    History  Substance Use Topics  . Smoking status: Current Every Day Smoker -- 1.00 packs/day for 18 years    Types: Cigarettes  . Smokeless tobacco: Never Used     Comment: TRYING TO QUIT/ LESS THAN 1/2 PPD  . Alcohol Use: No     Comment: occasional    Family History  Problem Relation Age of Onset  . Eclampsia Maternal Grandmother   . Heart attack Maternal Grandmother   . Cancer Maternal Grandmother     lung  . Heart disease Maternal Grandmother   . Hyperlipidemia Maternal Grandmother   .  Hypertension Maternal Grandmother   . Cancer Father     brain tumor      Review of Systems A comprehensive review of systems was negative.   EXAM: Patient is a very thin white female in discomfort but no acute distress.  Lungs are clear to auscultation , the patient has symmetrical respiratory excursion. Heart has a regular rate and rhythm normal S1 and S2 no murmur.   Abdomen is soft nontender nondistended bowel sounds are present. Extremity examination shows no clubbing cyanosis or edema. Motor examination shows 5 over 5 strength in the lower extremities including the iliopsoas quadriceps dorsiflexor extensor hallicus  longus and plantar flexor bilaterally. Sensation is intact to pinprick in the distal lower extremities. Reflexes are symmetrical bilaterally. No pathologic reflexes are present. Patient has a normal gait and stance.    Data Review:CBC    Component Value Date/Time   WBC 10.7* 07/20/2014 1420   RBC 4.52 07/20/2014 1420   RBC 4.17 03/10/2012 1125   HGB 15.1* 07/20/2014 1420   HCT 44.5 07/20/2014 1420   PLT 389 07/20/2014 1420   MCV 98.5 07/20/2014 1420   MCH 33.4 07/20/2014 1420   MCHC 33.9 07/20/2014 1420   RDW 13.1 07/20/2014 1420   LYMPHSABS 2.2 12/22/2012 1016   MONOABS 0.5 12/22/2012 1016   EOSABS 0.2 12/22/2012 1016  BASOSABS 0.0 12/22/2012 1016                          BMET    Component Value Date/Time   NA 140 07/20/2014 1420   K 4.3 07/20/2014 1420   CL 104 07/20/2014 1420   CO2 29 07/20/2014 1420   GLUCOSE 90 07/20/2014 1420   BUN 5* 07/20/2014 1420   CREATININE 0.68 07/20/2014 1420   CALCIUM 9.7 07/20/2014 1420   CALCIUM 9.5 02/09/2006 1033   GFRNONAA >60 07/20/2014 1420   GFRAA >60 07/20/2014 1420     Assessment/Plan: Patient with right lumbar radiculopathy, secondary to right L5-S1 lumbar disc herniation.  Patient rated for a right L5-S1 lumbar laminotomy and micro-discectomy.  Motor examination shows 5 over 5 strength in the lower  extremities including the iliopsoas quadriceps dorsiflexor extensor hallicus  longus and plantar flexor bilaterally. Sensation is intact to pinprick in the distal lower extremities. Reflexes are symmetrical bilaterally. No pathologic reflexes are present. Patient has a normal gait and stance.    Hosie Spangle, MD 07/23/2014 7:24 AM

## 2014-07-23 NOTE — Transfer of Care (Signed)
Immediate Anesthesia Transfer of Care Note  Patient: Melissa Walker  Procedure(s) Performed: Procedure(s) with comments: Right Lumbar five-Sacral one laminotomy and microdiskectomy (Right) - Right Lumbar five-Sacral one laminotomy and microdiskectomy  Patient Location: PACU  Anesthesia Type:General  Level of Consciousness: awake, alert  and oriented  Airway & Oxygen Therapy: Patient Spontanous Breathing and Patient connected to nasal cannula oxygen  Post-op Assessment: Report given to RN, Post -op Vital signs reviewed and stable and Patient moving all extremities X 4  Post vital signs: Reviewed and stable  Last Vitals: There were no vitals filed for this visit.  Complications: No apparent anesthesia complications

## 2014-07-23 NOTE — Progress Notes (Signed)
Discharge instructions, education, Rx given to patient with husband at bedside and they both verbalized understanding. No redness,no swelling, no drainage on  Incision site.

## 2014-07-23 NOTE — Discharge Summary (Signed)
Physician Discharge Summary  Patient ID: Melissa Walker MRN: [id number] DOB/AGE: [birth year] 34 y.o.  Admit date: 07/23/2014 Discharge date: 07/23/2014  Admission Diagnoses:  Right L5-S1 lumbar herniated disc, lumbar spondylosis, lumbar degenerative disease, lumbar radiculopathy  Discharge Diagnoses:  Right L5-S1 lumbar herniated disc, lumbar spondylosis, lumbar degenerative disease, lumbar radiculopathy Active Problems:   HNP (herniated nucleus pulposus), lumbar   Discharged Condition: good  Hospital Course:  Patient admitted, underwent a right L5-S1 lumbar laminotomy and microdiscectomy. She's doing well following surgery. She feels the right lotion symptoms are noticeably improved. She has been up and ambulating in the halls. She has voided. Her dressing is clean and dry. She is asking to be discharged to home this evening. We have given her instructions regarding wound care and activities. She is to have the bandage removed tomorrow, and to shower. She is to return for follow-up with me in 3 weeks.  Discharge Exam: Blood pressure 125/41, pulse 82, temperature 98.1 F (36.7 C), resp. rate 18, height 5\' 3"  (1.6 m), weight 36.333 kg (80 lb 1.6 oz), last menstrual period 06/13/2014, SpO2 99 %, not currently breastfeeding.  Disposition:  home     Medication List    STOP taking these medications        clindamycin 300 MG capsule  Commonly known as:  CLEOCIN      TAKE these medications        HYDROcodone-acetaminophen 5-325 MG per tablet  Commonly known as:  NORCO  Take 1 tablet by mouth every 6 (six) hours as needed for moderate pain.     methocarbamol 500 MG tablet  Commonly known as:  ROBAXIN  Take 1.5 tablets (750 mg total) by mouth 4 (four) times daily.     oxyCODONE-acetaminophen 5-325 MG per tablet  Commonly known as:  PERCOCET/ROXICET  Take 1-2 tablets by mouth every 4 (four) hours as needed for moderate pain.         Signed: Hosie Spangle,  MD 07/23/2014, 6:02 PM

## 2014-07-24 ENCOUNTER — Encounter (HOSPITAL_COMMUNITY): Payer: Self-pay | Admitting: Neurosurgery

## 2014-07-25 ENCOUNTER — Other Ambulatory Visit: Payer: Self-pay | Admitting: *Deleted

## 2014-07-25 DIAGNOSIS — M545 Low back pain: Secondary | ICD-10-CM

## 2014-07-26 MED ORDER — METHOCARBAMOL 500 MG PO TABS: 1000.0000 mg | ORAL_TABLET | Freq: Four times a day (QID) | ORAL | Status: DC

## 2014-08-02 ENCOUNTER — Other Ambulatory Visit: Payer: Self-pay | Admitting: *Deleted

## 2014-08-02 DIAGNOSIS — M545 Low back pain: Secondary | ICD-10-CM

## 2014-08-02 NOTE — Telephone Encounter (Signed)
Last refill 4/18 for # 120  Pt had back surgery on 5/23 and was given percocet # 60.  Pt states she had # 10 left and will not take vicodin with percocet.

## 2014-08-03 MED ORDER — HYDROCODONE-ACETAMINOPHEN 5-325 MG PO TABS
1.0000 | ORAL_TABLET | Freq: Four times a day (QID) | ORAL | Status: DC | PRN
Start: 2014-08-03 — End: 2014-09-10

## 2014-09-10 ENCOUNTER — Other Ambulatory Visit: Payer: Self-pay | Admitting: *Deleted

## 2014-09-10 DIAGNOSIS — M545 Low back pain: Secondary | ICD-10-CM

## 2014-09-10 NOTE — Telephone Encounter (Signed)
Pt # [id number] Last refill - 6/6

## 2014-09-11 MED ORDER — HYDROCODONE-ACETAMINOPHEN 5-325 MG PO TABS: 1.0000 | ORAL_TABLET | Freq: Four times a day (QID) | ORAL | Status: DC | PRN

## 2014-09-11 NOTE — Telephone Encounter (Signed)
Pt informed Rx is ready 

## 2014-10-17 ENCOUNTER — Other Ambulatory Visit: Payer: Self-pay | Admitting: *Deleted

## 2014-10-17 ENCOUNTER — Telehealth: Payer: Self-pay

## 2014-10-17 DIAGNOSIS — M545 Low back pain: Secondary | ICD-10-CM

## 2014-10-17 NOTE — Telephone Encounter (Signed)
duplicate

## 2014-10-17 NOTE — Telephone Encounter (Signed)
This pt has not been to office since 01/2014 Pt had surgery somewhere around 5/23 by dr Rita Ohara Last UDS 01/2014 Last refill at pharm for norco #120 7/15 Oxycodone #60 5/24 Robaxin #240 7/27, has no refills left at [facility] ([place])

## 2014-10-18 MED ORDER — METHOCARBAMOL 500 MG PO TABS: 1000.0000 mg | ORAL_TABLET | Freq: Four times a day (QID) | ORAL | Status: DC

## 2014-10-19 NOTE — Telephone Encounter (Signed)
Can not get an answer at ph, no vmail today

## 2014-10-24 ENCOUNTER — Telehealth: Payer: Self-pay

## 2014-10-24 NOTE — Telephone Encounter (Signed)
Patient calling about medication.

## 2014-10-24 NOTE — Telephone Encounter (Signed)
Talked with pt and scheduled an appointment for Friday to talk about restarting pain meds. She had surgery and has f/u on Monday with surgeon.

## 2014-10-26 ENCOUNTER — Encounter: Payer: Self-pay | Admitting: Internal Medicine

## 2014-10-26 ENCOUNTER — Ambulatory Visit (INDEPENDENT_AMBULATORY_CARE_PROVIDER_SITE_OTHER): Payer: Medicaid Other | Admitting: Internal Medicine

## 2014-10-26 VITALS — BP 129/74 | HR 78 | Temp 97.5°F | Ht 63.0 in | Wt 84.7 lb

## 2014-10-26 DIAGNOSIS — R636 Underweight: Secondary | ICD-10-CM

## 2014-10-26 DIAGNOSIS — F32A Depression, unspecified: Secondary | ICD-10-CM

## 2014-10-26 DIAGNOSIS — G8929 Other chronic pain: Secondary | ICD-10-CM | POA: Diagnosis not present

## 2014-10-26 DIAGNOSIS — M5441 Lumbago with sciatica, right side: Secondary | ICD-10-CM | POA: Diagnosis not present

## 2014-10-26 DIAGNOSIS — K8020 Calculus of gallbladder without cholecystitis without obstruction: Secondary | ICD-10-CM

## 2014-10-26 DIAGNOSIS — Z0000 Encounter for general adult medical examination without abnormal findings: Secondary | ICD-10-CM | POA: Insufficient documentation

## 2014-10-26 DIAGNOSIS — F419 Anxiety disorder, unspecified: Secondary | ICD-10-CM | POA: Insufficient documentation

## 2014-10-26 DIAGNOSIS — M5417 Radiculopathy, lumbosacral region: Secondary | ICD-10-CM

## 2014-10-26 DIAGNOSIS — J069 Acute upper respiratory infection, unspecified: Secondary | ICD-10-CM

## 2014-10-26 DIAGNOSIS — F418 Other specified anxiety disorders: Secondary | ICD-10-CM

## 2014-10-26 DIAGNOSIS — F329 Major depressive disorder, single episode, unspecified: Secondary | ICD-10-CM

## 2014-10-26 HISTORY — DX: Calculus of gallbladder without cholecystitis without obstruction: K80.20

## 2014-10-26 HISTORY — DX: Underweight: R63.6

## 2014-10-26 MED ORDER — BENZONATATE 100 MG PO CAPS: 100.0000 mg | ORAL_CAPSULE | Freq: Two times a day (BID) | ORAL | Status: DC | PRN

## 2014-10-26 MED ORDER — SERTRALINE HCL 50 MG PO TABS: 50.0000 mg | ORAL_TABLET | Freq: Every day | ORAL | Status: DC

## 2014-10-26 MED ORDER — AZITHROMYCIN 250 MG PO TABS: ORAL_TABLET | ORAL | Status: DC

## 2014-10-26 MED ORDER — HYDROCODONE-ACETAMINOPHEN 5-325 MG PO TABS: 1.0000 | ORAL_TABLET | Freq: Four times a day (QID) | ORAL | Status: DC | PRN

## 2014-10-26 NOTE — Assessment & Plan Note (Addendum)
-  Defer annual influenza vaccination until next visit since pt is ill today -Pt needs screening mammogram starting at age 34 per last breast US performed in 09/04/10

## 2014-10-26 NOTE — Assessment & Plan Note (Signed)
Assessment: Pt with 88-month history of anxiety and depression not previously or currently on medical therapy in setting of multiple life stressors who presents with anxious mood.    Plan:  -Prescribe zoloft 50 mg daily  -Pt instructed to return in 6 weeks for further dose adjustment based on mood

## 2014-10-26 NOTE — Assessment & Plan Note (Signed)
Assessment: Pt with chronic low back pain with last MRI on 06/07/14 with stable appearing central and slightly right paracentral disc protrusion at L5-S1 with potential irritation of the right S1 nerve root s/p right L5-S1 laminectomy and microdisectomy on 07/23/14 compliant with muscle relaxant therapy and narcotic therapy who presents with moderately controlled pain with no alarm  symptoms.   Plan:  -Pt to follow-up with Dr. Sherwood Gambler on 10/29/14   -Continue methocarbamol 1000 mg QID for muscle spasms -Prescribe norco  5-325 mg Q 6 hr (# 120) PRN pain, pt given printed prescription today on 10/26/14  -Will obtain prescription abuse monitoring (panel 15) at next visit, last one in December 2015 was appropriate  -Pt instructed to return in 6 weeks for discussion of pain contract

## 2014-10-26 NOTE — Progress Notes (Signed)
Patient ID: Melissa Walker, female   DOB: 1980/09/08, 34 y.o.   MRN: [id number]     Subjective:   Patient ID: Melissa Walker female   DOB: 1981/02/14 34 y.o.   MRN: [id number]  HPI: Melissa Walker is a 34 y.o. pleasant woman with past medical history of chronic low back pain, underweight, cholelithiasis, and tobacco abuse who presents with chief complaint of low back pain and muscle spasms.  She reports chronic low back pain since an injury following childbirth about 10 years ago.  Her last lumbar MRI on 06/07/14 revealed stable appearing central and slightly right paracentral disc protrusion at L5-S1 with potential irritation of the right S1 nerve root. She is status post right L5-S1 laminectomy and microdisectomy on 07/23/14 with Dr. Sherwood Gambler. She reports she was doing well until about 1 month after the surgery when her right leg numbness returned along with pain. She has follow-up with Dr. Sherwood Gambler in three days. She is currently taking methocarbamol 1000 mg four times daily for muscle spasms and norco about 2-3 as needed for pain with improvement of 10/10 to 5/10 with use. The pain is located in her right low back area with radiation to her right hip and leg. She has numbness and tingling of her right LE and sometimes has problems driving due to her right foot numbness. She is ambulating with mild difficulty depending on how far she has to walk with no recent fall, injury, heavy lifting (besides her baby), or trauma. She denies LE weakness or bladder/bowel incontinence. She is not currently undergoing physical rehab and reports it did not work in the past. She reports she may need to file for disability if her pain continues. She denies recreation drug use and very rarely drinks alcohol.   She reports having non-purulent rhinorrhea, nasal congestion, sneezing, sinus headache, fatigue, yellow-green sputum, cough, chills, and bilateral ear pressure for the past 2 days. She denies recent sick  contacts. She has mild history of seasonal allergic rhinitis and does occasionally use anti-histamine and nasal spray.  She reports having similar URI symptoms in the past requiring urgent care visit for antibiotic therapy and requests antibiotics today. She took OTC pseudoephed with no relief.   She reports having stress in her life for the past 6 months due to her mother having a MI, close friends losing their children, and dealing with a family member with dementia. She reports feeling anxious, worrying, and feeling sad sometimes with normal appetite, energy level, sleep, and interest in activities she normally enjoys. She has never been on medical therapy for anxiety or depression in the past and would like to start medication.    Past Medical History  Diagnosis Date  . Neuromuscular disorder   . Tobacco abuse   . Chronic back pain   . Abnormal uterine bleeding 02/04/2012    Occurred 01/12/12-01/15/12. Evaluated in MAU and all labs, exam, and pelvic u/s WNL. Given rx for ibuprofen and told to follow up as otupatient.     Current Outpatient Prescriptions  Medication Sig Dispense Refill  . HYDROcodone-acetaminophen (NORCO) 5-325 MG per tablet Take 1 tablet by mouth every 6 (six) hours as needed for moderate pain. 120 tablet 0  . methocarbamol (ROBAXIN) 500 MG tablet Take 2 tablets (1,000 mg total) by mouth 4 (four) times daily. 240 tablet 2  . oxyCODONE-acetaminophen (PERCOCET/ROXICET) 5-325 MG per tablet Take 1-2 tablets by mouth every 4 (four) hours as needed for moderate pain. 60 tablet 0  No current facility-administered medications for this visit.   Family History  Problem Relation Age of Onset  . Eclampsia Maternal Grandmother   . Heart attack Maternal Grandmother   . Cancer Maternal Grandmother     lung  . Heart disease Maternal Grandmother   . Hyperlipidemia Maternal Grandmother   . Hypertension Maternal Grandmother   . Cancer Father     brain tumor    Social History    Social History  . Marital Status: Single    Spouse Name: N/A  . Number of Children: N/A  . Years of Education: N/A   Social History Main Topics  . Smoking status: Current Every Day Smoker -- 1.00 packs/day for 18 years    Types: Cigarettes  . Smokeless tobacco: Never Used     Comment: TRYING TO QUIT/ LESS THAN 1/2 PPD  . Alcohol Use: No     Comment: occasional  . Drug Use: No  . Sexual Activity: Yes    Birth Control/ Protection: Condom   Other Topics Concern  . Not on file   Social History Narrative   Review of Systems: Review of Systems  Constitutional: Positive for chills and weight loss (10 lb in 1 year ). Negative for fever and malaise/fatigue.  HENT: Positive for congestion and ear pain (b/l  pressure like). Negative for ear discharge and sore throat.        Non-purulent rhinorrhea and sneezing  Respiratory: Positive for cough and sputum production (yellow-green ). Negative for shortness of breath and wheezing.   Cardiovascular: Negative for chest pain and leg swelling.  Gastrointestinal: Negative for nausea, vomiting, abdominal pain, diarrhea, constipation and blood in stool.  Genitourinary: Negative for dysuria, urgency, frequency and hematuria.  Musculoskeletal: Positive for back pain (chronic right low ). Negative for falls.       Right low back and right LE muscle spasms  Neurological: Positive for sensory change (chronic right LE ) and headaches. Negative for dizziness and focal weakness.  Endo/Heme/Allergies: Positive for environmental allergies (seasonal ).  Psychiatric/Behavioral: Positive for depression. Negative for substance abuse. The patient is nervous/anxious. The patient does not have insomnia.     Objective:  Physical Exam: Filed Vitals:   10/26/14 1003  BP: 129/74  Pulse: 78  Temp: 97.5 F (36.4 C)  TempSrc: Oral  Height: 5\' 3"  (1.6 m)  Weight: 84 lb 11.2 oz (38.42 kg)  SpO2: 100%    Physical Exam  Constitutional: She is oriented to  person, place, and time. No distress.  Thin-appearing   HENT:  Head: Normocephalic and atraumatic.  Right Ear: External ear normal.  Left Ear: External ear normal.  Nose: Nose normal.  Mouth/Throat: Oropharynx is clear and moist. No oropharyngeal exudate.  Tympanic membranes clear bilaterally  Eyes: Conjunctivae and EOM are normal. Pupils are equal, round, and reactive to light. Right eye exhibits no discharge. Left eye exhibits no discharge. No scleral icterus.  Neck: Normal range of motion. Neck supple.  Cardiovascular: Normal rate, regular rhythm and normal heart sounds.   No murmur heard. Pulmonary/Chest: Effort normal and breath sounds normal. No respiratory distress. She has no wheezes. She has no rales.  Abdominal: Soft. She exhibits no distension. There is no tenderness. There is no rebound and no guarding.  Musculoskeletal: Normal range of motion. She exhibits no edema or tenderness.  Neurological: She is alert and oriented to person, place, and time. She has normal reflexes. She displays normal reflexes. She exhibits normal muscle tone. Coordination normal.  Normal  5/5 LE muscle strength with normal sensation to light touch of LE. Negative b/l straight leg test.    Skin: Skin is warm and dry. No rash noted. She is not diaphoretic. No erythema. No pallor.  Psychiatric: She has a normal mood and affect. Her behavior is normal. Judgment and thought content normal.    Assessment & Plan:   Please see problem list for problem-based assessment and plan

## 2014-10-26 NOTE — Patient Instructions (Addendum)
-Start taking azithromycin 2 pills today then 1 pill for the next 4 days for your cold  -Take tessalon twice a day as needed for cough -You can try OTC zyrtec 10 mg daily as needed for allergies -Start taking zoloft 50 mg daily for anxiety and come back in 6 weeks for further adjustment  -I refilled your norco, please come in 6 weeks so we can put you on a pain contract after you see your surgeon  -Will give you a flu shot next time when you are not sick -Very nice seeing you again!  Generalized Anxiety Disorder Generalized anxiety disorder (GAD) is a mental disorder. It interferes with life functions, including relationships, work, and school. GAD is different from normal anxiety, which everyone experiences at some point in their lives in response to specific life events and activities. Normal anxiety actually helps [place] prepare for and get through these life events and activities. Normal anxiety goes away after the event or activity is over.  GAD causes anxiety that is not necessarily related to specific events or activities. It also causes excess anxiety in proportion to specific events or activities. The anxiety associated with GAD is also difficult to control. GAD can vary from mild to severe. People with severe GAD can have intense waves of anxiety with physical symptoms (panic attacks).  SYMPTOMS The anxiety and worry associated with GAD are difficult to control. This anxiety and worry are related to many life events and activities and also occur more days than not for 6 months or longer. People with GAD also have three or more of the following symptoms (one or more in children):  Restlessness.   Fatigue.  Difficulty concentrating.   Irritability.  Muscle tension.  Difficulty sleeping or unsatisfying sleep. DIAGNOSIS GAD is diagnosed through an assessment by your health care provider. Your health care provider will ask you questions aboutyour mood,physical symptoms, and events in  your life. Your health care provider may ask you about your medical history and use of alcohol or drugs, including prescription medicines. Your health care provider may also do a physical exam and blood tests. Certain medical conditions and the use of certain substances can cause symptoms similar to those associated with GAD. Your health care provider may refer you to a mental health specialist for further evaluation. TREATMENT The following therapies are usually used to treat GAD:   Medication. Antidepressant medication usually is prescribed for long-term daily control. Antianxiety medicines may be added in severe cases, especially when panic attacks occur.   Talk therapy (psychotherapy). Certain types of talk therapy can be helpful in treating GAD by providing support, education, and guidance. A form of talk therapy called cognitive behavioral therapy can teach you healthy ways to think about and react to daily life events and activities.  Stress managementtechniques. These include yoga, meditation, and exercise and can be very helpful when they are practiced regularly. A mental health specialist can help determine which treatment is best for you. Some people see improvement with one therapy. However, other people require a combination of therapies. Document Released: 06/13/2012 Document Revised: 07/03/2013 Document Reviewed: 06/13/2012 [facility] Patient Information 2015 [place], [place]. This information is not intended to replace advice given to you by your health care provider. Make sure you discuss any questions you have with your health care provider.    Upper Respiratory Infection, Adult An upper respiratory infection (URI) is also sometimes known as the common cold. The upper respiratory tract includes the nose, sinuses, throat, trachea,  and bronchi. Bronchi are the airways leading to the lungs. Most people improve within 1 week, but symptoms can last up to 2 weeks. A residual cough may last  even longer.  CAUSES Many different viruses can infect the tissues lining the upper respiratory tract. The tissues become irritated and inflamed and often become very moist. Mucus production is also common. A cold is contagious. You can easily spread the virus to others by oral contact. This includes kissing, sharing a glass, coughing, or sneezing. Touching your mouth or nose and then touching a surface, which is then touched by another person, can also spread the virus. SYMPTOMS  Symptoms typically develop 1 to 3 days after you come in contact with a cold virus. Symptoms vary from person to person. They may include:  Runny nose.  Sneezing.  Nasal congestion.  Sinus irritation.  Sore throat.  Loss of voice (laryngitis).  Cough.  Fatigue.  Muscle aches.  Loss of appetite.  Headache.  Low-grade fever. DIAGNOSIS  You might diagnose your own cold based on familiar symptoms, since most people get a cold 2 to 3 times a year. Your caregiver can confirm this based on your exam. Most importantly, your caregiver can check that your symptoms are not due to another disease such as strep throat, sinusitis, pneumonia, asthma, or epiglottitis. Blood tests, throat tests, and X-rays are not necessary to diagnose a common cold, but they may sometimes be helpful in excluding other more serious diseases. Your caregiver will decide if any further tests are required. RISKS AND COMPLICATIONS  You may be at risk for a more severe case of the common cold if you smoke cigarettes, have chronic heart disease (such as heart failure) or lung disease (such as asthma), or if you have a weakened immune system. The very young and very old are also at risk for more serious infections. Bacterial sinusitis, middle ear infections, and bacterial pneumonia can complicate the common cold. The common cold can worsen asthma and chronic obstructive pulmonary disease (COPD). Sometimes, these complications can require emergency  medical care and may be life-threatening. PREVENTION  The best way to protect against getting a cold is to practice good hygiene. Avoid oral or hand contact with people with cold symptoms. Wash your hands often if contact occurs. There is no clear evidence that vitamin C, vitamin E, echinacea, or exercise reduces the chance of developing a cold. However, it is always recommended to get plenty of rest and practice good nutrition. TREATMENT  Treatment is directed at relieving symptoms. There is no cure. Antibiotics are not effective, because the infection is caused by a virus, not by bacteria. Treatment may include:  Increased fluid intake. Sports drinks offer valuable electrolytes, sugars, and fluids.  Breathing heated mist or steam (vaporizer or shower).  Eating chicken soup or other clear broths, and maintaining good nutrition.  Getting plenty of rest.  Using gargles or lozenges for comfort.  Controlling fevers with ibuprofen or acetaminophen as directed by your caregiver.  Increasing usage of your inhaler if you have asthma. Zinc gel and zinc lozenges, taken in the first 24 hours of the common cold, can shorten the duration and lessen the severity of symptoms. Pain medicines may help with fever, muscle aches, and throat pain. A variety of non-prescription medicines are available to treat congestion and runny nose. Your caregiver can make recommendations and may suggest nasal or lung inhalers for other symptoms.  HOME CARE INSTRUCTIONS   Only take over-the-counter or prescription medicines for  pain, discomfort, or fever as directed by your caregiver.  Use a warm mist humidifier or inhale steam from a shower to increase air moisture. This may keep secretions moist and make it easier to breathe.  Drink enough water and fluids to keep your urine clear or pale yellow.  Rest as needed.  Return to work when your temperature has returned to normal or as your caregiver advises. You may need to  stay home longer to avoid infecting others. You can also use a face mask and careful hand washing to prevent spread of the virus. SEEK MEDICAL CARE IF:   After the first few days, you feel you are getting worse rather than better.  You need your caregiver's advice about medicines to control symptoms.  You develop chills, worsening shortness of breath, or brown or red sputum. These may be signs of pneumonia.  You develop yellow or brown nasal discharge or pain in the face, especially when you bend forward. These may be signs of sinusitis.  You develop a fever, swollen neck glands, pain with swallowing, or white areas in the back of your throat. These may be signs of strep throat. SEEK IMMEDIATE MEDICAL CARE IF:   You have a fever.  You develop severe or persistent headache, ear pain, sinus pain, or chest pain.  You develop wheezing, a prolonged cough, cough up blood, or have a change in your usual mucus (if you have chronic lung disease).  You develop sore muscles or a stiff neck. Document Released: 08/12/2000 Document Revised: 05/11/2011 Document Reviewed: 05/24/2013 [facility] Patient Information 2015 [place], [place]. This information is not intended to replace advice given to you by your health care provider. Make sure you discuss any questions you have with your health care provider.     General Instructions:   Please bring your medicines with you each time you come to clinic.  Medicines may include prescription medications, over-the-counter medications, herbal remedies, eye drops, vitamins, or other pills.   Progress Toward Treatment Goals:  No flowsheet data found.  Self Care Goals & Plans:  Self Care Goal 10/26/2014  Manage my medications take my medicines as prescribed; bring my medications to every visit; refill my medications on time  Eat healthy foods drink diet soda or water instead of juice or soda; eat more vegetables; eat foods that are low in salt; eat baked foods  instead of fried foods  Be physically active -    No flowsheet data found.   Care Management & Community Referrals:  No flowsheet data found.

## 2014-10-26 NOTE — Assessment & Plan Note (Signed)
Assessment: Pt with two-day history of upper respiratory symptoms most likely due to acute respiratory infection vs flare of allergic rhinitis.   Plan:  -Prescribe zpack (500 mg for 1 day followed by 250 mg for 4 days) due to pt preference and history of similar symptoms requiring antibiotic therapy -Prescribe tessalon perles 100 mg BID PRN cough -Pt instructed to use OTC zyrtec 10 mg daily as needed for allergic rhinitis  -Pt instructed to rest and increase fluid intake  -Pt instructed to take OTC ibuprofen for fever and/or pain -Pt instructed to return if symptoms do not improve

## 2014-10-29 NOTE — Progress Notes (Signed)
Internal Medicine Clinic Attending  Case discussed with Dr. Rabbani soon after the resident saw the patient.  We reviewed the resident's history and exam and pertinent patient test results.  I agree with the assessment, diagnosis, and plan of care documented in the resident's note.  

## 2014-11-30 ENCOUNTER — Encounter: Payer: Medicaid Other | Admitting: Internal Medicine

## 2014-12-03 ENCOUNTER — Encounter: Payer: Self-pay | Admitting: Internal Medicine

## 2014-12-03 ENCOUNTER — Ambulatory Visit (INDEPENDENT_AMBULATORY_CARE_PROVIDER_SITE_OTHER): Payer: Medicaid Other | Admitting: Internal Medicine

## 2014-12-03 VITALS — BP 117/68 | HR 84 | Temp 98.2°F | Ht 63.0 in | Wt 81.6 lb

## 2014-12-03 DIAGNOSIS — F329 Major depressive disorder, single episode, unspecified: Secondary | ICD-10-CM

## 2014-12-03 DIAGNOSIS — L709 Acne, unspecified: Secondary | ICD-10-CM

## 2014-12-03 DIAGNOSIS — F419 Anxiety disorder, unspecified: Secondary | ICD-10-CM

## 2014-12-03 DIAGNOSIS — L700 Acne vulgaris: Secondary | ICD-10-CM | POA: Diagnosis not present

## 2014-12-03 DIAGNOSIS — Z681 Body mass index (BMI) 19 or less, adult: Secondary | ICD-10-CM | POA: Diagnosis not present

## 2014-12-03 DIAGNOSIS — R636 Underweight: Secondary | ICD-10-CM | POA: Diagnosis not present

## 2014-12-03 DIAGNOSIS — M5417 Radiculopathy, lumbosacral region: Secondary | ICD-10-CM

## 2014-12-03 MED ORDER — TRETINOIN 0.025 % EX CREA: TOPICAL_CREAM | Freq: Every day | CUTANEOUS | Status: DC

## 2014-12-03 MED ORDER — BENZOYL PEROXIDE 2.5 % EX CREA: TOPICAL_CREAM | CUTANEOUS | Status: DC

## 2014-12-03 NOTE — Patient Instructions (Addendum)
Melissa Walker it was nice meeting you today!   -Please apply Tretinoin cream and Benzoyl peroxide cream to face, chest, and back as directed.   -Continue taking Sertraline 50 mg daily.   -Return for a follow up visit in 1 month.

## 2014-12-04 DIAGNOSIS — L709 Acne, unspecified: Secondary | ICD-10-CM | POA: Insufficient documentation

## 2014-12-04 NOTE — Assessment & Plan Note (Signed)
Patient denies any symptoms of eating disorder such as binging or purging. Her BMI is 14.5 which puts her at risk for malnutrition.  -She has been advised to add Boost supplement to her diet.

## 2014-12-04 NOTE — Assessment & Plan Note (Signed)
Patient states she took Norco this morning.  -Random UDS ordered today. Please f/u results.

## 2014-12-04 NOTE — Progress Notes (Signed)
Patient ID: Melissa Walker, female   DOB: 01/30/1981, 34 y.o.   MRN: [id number]   Subjective:   Patient ID: Melissa Walker female   DOB: [birth year] 34 y.o.   MRN: [id number]  HPI: Melissa Walker is a 34 y.o. F with a PMHx of chronic lower back pain, underweight, cholelithiasis, and tobacco abuse presenting for a follow up of depression and anxiety. Patient states she was started on Zoloft during her previous visit and has mood has been good with this new medication. Denies any changes in sleep, energy level, or appetite. Denies loss of interest, feelings of guilt, or decreased concentration. Denies having any suicidal or homicidal ideation. Patient is also concerned about acne on her face, chest, and back that she has had since teenage years. States she has never seen a dermatologist due to high insurance co-payments. States the acne is worse before her menstrual cycle starts each month. As per patient, she has been prescribed oral antibiotics by urgent care centers in the past but the acne returns after she stops taking them. Patient reports having tubal ligation done and is not currently on OCPs.     Past Medical History  Diagnosis Date  . Neuromuscular disorder ([place])   . Tobacco abuse   . Chronic back pain   . Abnormal uterine bleeding 02/04/2012    Occurred 01/12/12-01/15/12. Evaluated in [facility] and all labs, exam, and pelvic u/s WNL. Given rx for ibuprofen and told to follow up as otupatient.     Current Outpatient Prescriptions  Medication Sig Dispense Refill  . azithromycin (ZITHROMAX Z-PAK) 250 MG tablet 2 po day one, then 1 daily x 4 days 6 tablet 0  . benzonatate (TESSALON PERLES) 100 MG capsule Take 1 capsule (100 mg total) by mouth 2 (two) times daily as needed for cough. 20 capsule 0  . Benzoyl Peroxide 2.5 % CREA Apply topically every morning. 21 g 2  . HYDROcodone-acetaminophen (NORCO) 5-325 MG per tablet Take 1 tablet by mouth every 6 (six) hours as needed for moderate  pain. 120 tablet 0  . methocarbamol (ROBAXIN) 500 MG tablet Take 2 tablets (1,000 mg total) by mouth 4 (four) times daily. 240 tablet 2  . sertraline (ZOLOFT) 50 MG tablet Take 1 tablet (50 mg total) by mouth daily. 30 tablet 2  . tretinoin (RETIN-A) 0.025 % cream Apply topically at bedtime. 45 g 2   No current facility-administered medications for this visit.   Family History  Problem Relation Age of Onset  . Eclampsia Maternal Grandmother   . Heart attack Maternal Grandmother   . Cancer Maternal Grandmother     lung  . Heart disease Maternal Grandmother   . Hyperlipidemia Maternal Grandmother   . Hypertension Maternal Grandmother   . Cancer Father     brain tumor    Social History   Social History  . Marital Status: Single    Spouse Name: N/A  . Number of Children: N/A  . Years of Education: N/A   Social History Main Topics  . Smoking status: Current Every Day Smoker -- 1.00 packs/day for 18 years    Types: Cigarettes  . Smokeless tobacco: Never Used     Comment: TRYING TO QUIT/ LESS THAN 1/2 PPD  . Alcohol Use: No     Comment: occasional  . Drug Use: No  . Sexual Activity: Yes    Birth Control/ Protection: Condom   Other Topics Concern  . None   Social History Narrative  Review of Systems: Review of Systems  Constitutional: Negative for fever, chills and malaise/fatigue.  HENT: Negative for ear pain.   Eyes: Negative for blurred vision and pain.  Respiratory: Negative for cough, shortness of breath and wheezing.   Cardiovascular: Negative for chest pain, palpitations and leg swelling.  Gastrointestinal: Negative for nausea, vomiting, abdominal pain, diarrhea and constipation.  Genitourinary: Negative for dysuria, urgency and frequency.  Musculoskeletal: Negative for myalgias and joint pain.  Skin: Negative for itching and rash.  Neurological: Negative for dizziness, sensory change, focal weakness and headaches.  Psychiatric/Behavioral: Positive for  depression. Negative for suicidal ideas. The patient is nervous/anxious. The patient does not have insomnia.    Objective:  Physical Exam: Filed Vitals:   12/03/14 1513  BP: 117/68  Pulse: 84  Temp: 98.2 F (36.8 C)  TempSrc: Oral  Height: 5\' 3"  (1.6 m)  Weight: 81 lb 9.6 oz (37.014 kg)  SpO2: 99%   Physical Exam  Constitutional: She is oriented to person, place, and time. No distress.  Underweight   HENT:  Head: Normocephalic and atraumatic.  Eyes: EOM are normal. Pupils are equal, round, and reactive to light.  Neck: Neck supple. No tracheal deviation present.  Cardiovascular: Normal rate, regular rhythm and intact distal pulses.   Pulmonary/Chest: Effort normal. No respiratory distress. She has no wheezes. She has no rales.  Abdominal: Soft. Bowel sounds are normal. She exhibits no distension. There is no tenderness.  Musculoskeletal: Normal range of motion. She exhibits no edema.  Neurological: She is alert and oriented to person, place, and time.  Skin: Skin is warm and dry.  Mild papulopustular acne on forehead, chin, neck, upper chest, and back.        Assessment & Plan:

## 2014-12-04 NOTE — Assessment & Plan Note (Signed)
Patient reports having better mood and less anxiety since she has been started on Zoloft. Her PHQ-9 score is 3 and GAD-7 score is 2 at present.  -Zoloft 50 mg qd refilled today -Please reassess severity of depression via PHQ-9 and anxiety via GAD-7 questionnaires during follow up visit in 1 month. Adjust medication dosage based on results.

## 2014-12-05 NOTE — Assessment & Plan Note (Signed)
Patient has mild papulopustular acne on her forehead, chin, neck, upper chest, and back. She admits to scratching her skin and has residual scarring. Since her acne is hormonal in nature - only before her monthly menstrual cycle, patient has been started on topical agents instead of oral antibiotics.  -Tretinoin 0.025% cream to be applied at bedtime. This might help reduce scarring.  -Benzoyl peroxide 2.5% cream to be applied in the morning  -Reassess at follow-up visit in 1 month. If there is no improvement, consider Dermatology referral.

## 2014-12-05 NOTE — Progress Notes (Signed)
Internal Medicine Clinic Attending  I saw and evaluated the patient.  I personally confirmed the key portions of the history and exam documented by Dr. Marlowe Sax and I reviewed pertinent patient test results.  The assessment, diagnosis, and plan were formulated together and I agree with the documentation in the resident's note.  Most notably the patient is very underweight with BMI around 14. She denies calorie restriction or purging behavior to [place]. Says she has always been very thin, runs in her family. We can monitor this for now, encouraged high calorie supplements, and should have a weight recheck in our clinic in a few months. Would strongly consider referral to nutrition if this does not improve. 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 We advised strongly against this, she has insight into it, follow up with [place] in 1-2 months.

## 2014-12-06 LAB — OPIATES CONFIRMATION, URINE
Codeine: NEGATIVE
HYDROCODONE CONFIRM: 169 ng/mL
Hydrocodone: POSITIVE — AB
Hydromorphone Confirm: 174 ng/mL
Hydromorphone: POSITIVE — AB
Morphine: NEGATIVE
OPIATES: POSITIVE ng/mL — AB

## 2014-12-06 LAB — PRESCRIPTION ABUSE MONITORING 17P, URINE
6-Acetylmorphine, Urine: NEGATIVE ng/mL
Amphetamine Scrn, Ur: NEGATIVE ng/mL
BARBITURATE SCREEN URINE: NEGATIVE ng/mL
BENZODIAZEPINE SCREEN, URINE: NEGATIVE ng/mL
Buprenorphine, Urine: NEGATIVE ng/mL
CANNABINOIDS UR QL SCN: NEGATIVE ng/mL
COCAINE(METAB.)SCREEN, URINE: NEGATIVE ng/mL
Carisoprodol/Meprobamate, Ur: NEGATIVE ng/mL
Creatinine(Crt), U: 70.2 mg/dL (ref 20.0–300.0)
EDDP, Urine: NEGATIVE ng/mL
FENTANYL, URINE: NEGATIVE pg/mL
MDMA Screen, Urine: NEGATIVE ng/mL
MEPERIDINE SCREEN, URINE: NEGATIVE ng/mL
Methadone Screen, Urine: NEGATIVE ng/mL
Nitrite Urine, Quantitative: NEGATIVE ug/mL
OXYCODONE+OXYMORPHONE UR QL SCN: NEGATIVE ng/mL
Ph of Urine: 6 (ref 4.5–8.9)
Phencyclidine Qn, Ur: NEGATIVE ng/mL
Propoxyphene Scrn, Ur: NEGATIVE ng/mL
SPECIFIC GRAVITY: 1.009
Tapentadol, Urine: NEGATIVE ng/mL
Tramadol Screen, Urine: NEGATIVE ng/mL

## 2015-01-08 ENCOUNTER — Other Ambulatory Visit: Payer: Self-pay

## 2015-01-08 DIAGNOSIS — M5417 Radiculopathy, lumbosacral region: Secondary | ICD-10-CM

## 2015-01-08 NOTE — Telephone Encounter (Signed)
Patient is requesting refill on pain medication

## 2015-01-08 NOTE — Telephone Encounter (Signed)
Last written in clinic 8/26 Last uds 12/03/2014 Last appt 10/3 No future appt

## 2015-01-11 MED ORDER — HYDROCODONE-ACETAMINOPHEN 5-325 MG PO TABS: 1.0000 | ORAL_TABLET | Freq: Four times a day (QID) | ORAL | Status: DC | PRN

## 2015-01-14 NOTE — Telephone Encounter (Signed)
Called to inform rx ready for pick up, left vm

## 2015-01-18 ENCOUNTER — Other Ambulatory Visit: Payer: Self-pay | Admitting: Internal Medicine

## 2015-01-18 DIAGNOSIS — F329 Major depressive disorder, single episode, unspecified: Secondary | ICD-10-CM

## 2015-01-18 DIAGNOSIS — F419 Anxiety disorder, unspecified: Principal | ICD-10-CM

## 2015-01-18 MED ORDER — SERTRALINE HCL 50 MG PO TABS: 50.0000 mg | ORAL_TABLET | Freq: Every day | ORAL | Status: DC

## 2015-01-18 NOTE — Telephone Encounter (Signed)
Pt requesting sertraline to be filled.

## 2015-01-18 NOTE — Telephone Encounter (Signed)
Needs appt with PCP to F/U depression

## 2015-02-14 ENCOUNTER — Other Ambulatory Visit: Payer: Self-pay

## 2015-02-14 DIAGNOSIS — M545 Low back pain: Secondary | ICD-10-CM

## 2015-02-14 MED ORDER — METHOCARBAMOL 500 MG PO TABS: 1000.0000 mg | ORAL_TABLET | Freq: Four times a day (QID) | ORAL | Status: DC

## 2015-04-18 ENCOUNTER — Telehealth: Payer: Self-pay | Admitting: Internal Medicine

## 2015-04-18 NOTE — Telephone Encounter (Signed)
APPT REMINDER CALL/LMTCB IF SHE NEEDS TO CANCEL °

## 2015-04-19 ENCOUNTER — Encounter: Payer: Self-pay | Admitting: Internal Medicine

## 2015-04-19 ENCOUNTER — Ambulatory Visit (INDEPENDENT_AMBULATORY_CARE_PROVIDER_SITE_OTHER): Payer: Medicaid Other | Admitting: Internal Medicine

## 2015-04-19 VITALS — BP 145/76 | HR 67 | Temp 98.1°F | Wt 89.2 lb

## 2015-04-19 DIAGNOSIS — Z79899 Other long term (current) drug therapy: Secondary | ICD-10-CM

## 2015-04-19 DIAGNOSIS — F419 Anxiety disorder, unspecified: Secondary | ICD-10-CM

## 2015-04-19 DIAGNOSIS — L709 Acne, unspecified: Secondary | ICD-10-CM

## 2015-04-19 DIAGNOSIS — F418 Other specified anxiety disorders: Secondary | ICD-10-CM | POA: Diagnosis not present

## 2015-04-19 DIAGNOSIS — F329 Major depressive disorder, single episode, unspecified: Secondary | ICD-10-CM

## 2015-04-19 DIAGNOSIS — Z0000 Encounter for general adult medical examination without abnormal findings: Secondary | ICD-10-CM

## 2015-04-19 DIAGNOSIS — M5417 Radiculopathy, lumbosacral region: Secondary | ICD-10-CM

## 2015-04-19 MED ORDER — HYDROCODONE-ACETAMINOPHEN 5-325 MG PO TABS: 1.0000 | ORAL_TABLET | Freq: Four times a day (QID) | ORAL | Status: DC | PRN

## 2015-04-19 MED ORDER — DOXYCYCLINE HYCLATE 100 MG PO CAPS: 100.0000 mg | ORAL_CAPSULE | Freq: Two times a day (BID) | ORAL | Status: DC

## 2015-04-19 MED ORDER — SERTRALINE HCL 50 MG PO TABS: 100.0000 mg | ORAL_TABLET | Freq: Every day | ORAL | Status: DC

## 2015-04-19 MED ORDER — CLINDAMYCIN PHOSPHATE 1 % EX GEL
Freq: Two times a day (BID) | CUTANEOUS | Status: DC
Start: 2015-04-19 — End: 2016-09-08

## 2015-04-19 NOTE — Assessment & Plan Note (Addendum)
Assessment: Pt with evidence of moderate inflammatory adult acne who presents with no improvement of symptoms after topical benzoyl peroxide and retinoid therapy.   Plan: -Continue benzoyl peroxide 2.5% cream in morning -Continue retin-A 0.025% cream at bedtime  -Prescribe clindamycin gel 1% to apply in the morning -Prescribe PO doxycycline 100 mg BID for 12-week course -Pt to return in 12 weeks, if does not improve will refer to dermatology for oral isotretinoin

## 2015-04-19 NOTE — Patient Instructions (Signed)
-  Start taking zoloft 100 mg daily instead of 50 mg daily for your mood -Start taking doxycyline 100 mg twice a day for 12 weeks for your acne. Keep taking the topicals.  -I gave you 3 prescriptions for norco on your pain contract -Very nice seeing you, please come back in May for follow-up  General Instructions:   Please bring your medicines with you each time you come to clinic.  Medicines may include prescription medications, over-the-counter medications, herbal remedies, eye drops, vitamins, or other pills.   Progress Toward Treatment Goals:  No flowsheet data found.  Self Care Goals & Plans:  Self Care Goal 04/19/2015  Manage my medications take my medicines as prescribed; bring my medications to every visit  Eat healthy foods eat foods that are low in salt; eat baked foods instead of fried foods  Be physically active find an activity I enjoy  Stop smoking go to the [employer] ([email or web address]    No flowsheet data found.   Care Management & Community Referrals:  No flowsheet data found.

## 2015-04-19 NOTE — Assessment & Plan Note (Signed)
Did not address influenza vaccination at visit due to time constraint, will call her and inquire if would like to return to have it done

## 2015-04-19 NOTE — Progress Notes (Signed)
Copy of Pain Contract given to pt. 

## 2015-04-19 NOTE — Assessment & Plan Note (Signed)
Assessment: Pt with chronic low back pain with last MRI on 06/07/14 with stable appearing central and slightly right paracentral disc protrusion at L5-S1 with potential irritation of the right S1 nerve root s/p right L5-S1 laminectomy and microdisectomy on 07/23/14 compliant with muscle relaxant therapy and narcotic therapy who presents with uncontrolled pain since running out of narcotic therapy 76-month ago with no alarm symptoms.   Plan:  -Pt placed on monthly pain contract for norco5-325 mg Q 6 hr (# 120) PRN pain, pt given copy of contract and 3 printed prescriptions today to last until middle of May 2017 -Continue methocarbamol 1000 mg QID for muscle spasms -Last prescription abuse monitoring on 12/03/14 was appropriate  -Pt instructed to return in 3 months to reassess pain and function

## 2015-04-19 NOTE — Assessment & Plan Note (Signed)
Assessment: Pt with anxiety and depression compliant with SSRI therapy who presents with moderately well-controlled mood.   Plan:  -Increase zoloft 50 mg daily to 100 mg daily  -Continue to monitor mood

## 2015-04-19 NOTE — Progress Notes (Signed)
Patient ID: Melissa Walker, female   DOB: 1980/09/26, 35 y.o.   MRN: [id number]    Subjective:   Patient ID: Melissa Walker female   DOB: [birth year] 35 y.o.   MRN: [id number]  HPI: [name] is a 35 y.o. very pleasant woman with past medical history of chronic low back pain, underweight, cholelithiasis, anxiety, depression, adult acne, and tobacco use who presents for follow-up of low back pain, depression, and acne.   She reports chronic low back pain since an injury following childbirth about 36 years ago. Her last lumbar MRI on 06/07/14 revealed stable appearing central and slightly right paracentral disc protrusion at L5-S1 with potential irritation of the right S1 nerve root. She is status post right L5-S1 laminectomy and microdisectomy on 07/23/14 by Dr. Sherwood Gambler. She reports she was doing well until about 1 month after the surgery when her right leg numbness returned along with pain. She followed-up with Dr. Sherwood Gambler the end of August 2016 who told her she has residual scar tissue as well as right hip bursitis which she has been receiving corticosteroid injections. She is currently taking methocarbamol 1000 mg four times daily for muscle spasms but ran out of norco about 1 month ago which helps ease her pain from 9/10 to 6/10 and allows her to walk and play with her children. The pain is located in her right lower back with radiation to her right hip and leg. She has numbness and tingling of her right LE and sometimes has problems driving due to her right foot numbness. She is ambulating with mild difficulty depending on how far she has to walk with no recent fall, injury, heavy lifting (besides her baby), or trauma. She denies LE weakness or bladder/bowel incontinence. She is not currently undergoing physical rehab but is performing exercises she was given at home. She does not use recreation drugs and very rarely drinks alcohol. Her last UDS on 12/03/14 was appropriate for norco that  was prescribed to her. She would like to be on a monthly pain contract with our clinic.    She reports her depression and generalized anxiety is moderately well controlled on zoloft 50 mg daily and would like to increase the dosage.   She has adult acne which has not improved since last visit on 12/04/14  which she was prescribed retinoid cream and benzoyl peroxide. It is now also present on her back. She reports being given 7-day course of oral doxycyline in the past which cleared it up.     Past Medical History  Diagnosis Date  . Neuromuscular disorder ([place])   . Tobacco abuse   . Chronic back pain   . Abnormal uterine bleeding 02/04/2012    Occurred 01/12/12-01/15/12. Evaluated in [facility] and all labs, exam, and pelvic u/s WNL. Given rx for ibuprofen and told to follow up as otupatient.     Current Outpatient Prescriptions  Medication Sig Dispense Refill  . Benzoyl Peroxide 2.5 % CREA Apply topically every morning. 21 g 2  . doxycycline (VIBRAMYCIN) 100 MG capsule Take 1 capsule (100 mg total) by mouth 2 (two) times daily. 60 capsule 2  . HYDROcodone-acetaminophen (NORCO) 5-325 MG tablet Take 1 tablet by mouth every 6 (six) hours as needed for moderate pain. 120 tablet 0  . methocarbamol (ROBAXIN) 500 MG tablet Take 2 tablets (1,000 mg total) by mouth 4 (four) times daily. 240 tablet 2  . sertraline (ZOLOFT) 50 MG tablet Take 2 tablets (100 mg total) by mouth  daily. 60 tablet 2  . tretinoin (RETIN-A) 0.025 % cream Apply topically at bedtime. 45 g 2   No current facility-administered medications for this visit.   Family History  Problem Relation Age of Onset  . Eclampsia Maternal Grandmother   . Heart attack Maternal Grandmother   . Cancer Maternal Grandmother     lung  . Heart disease Maternal Grandmother   . Hyperlipidemia Maternal Grandmother   . Hypertension Maternal Grandmother   . Cancer Father     brain tumor    Social History   Social History  . Marital Status: Single     Spouse Name: N/A  . Number of Children: N/A  . Years of Education: N/A   Social History Main Topics  . Smoking status: Current Every Day Smoker -- 1.00 packs/day for 18 years    Types: Cigarettes  . Smokeless tobacco: Never Used     Comment: TRYING TO QUIT/ LESS THAN 1/2 PPD  . Alcohol Use: No     Comment: occasional  . Drug Use: No  . Sexual Activity: Yes    Birth Control/ Protection: Condom   Other Topics Concern  . None   Social History Narrative   Review of Systems: Review of Systems  Constitutional: Positive for diaphoresis (night time). Negative for fever, chills and weight loss.  Eyes: Negative for blurred vision.  Respiratory: Negative for cough, shortness of breath and wheezing.   Cardiovascular: Negative for chest pain and leg swelling.  Gastrointestinal: Negative for nausea, vomiting, abdominal pain, diarrhea and constipation.  Genitourinary: Negative for dysuria, urgency and frequency.  Musculoskeletal: Positive for back pain (chronic low back pain). Negative for falls.  Skin:       Acne on face and back  Neurological: Positive for sensory change (right sided sciatica ). Negative for dizziness and headaches.  Psychiatric/Behavioral: Positive for depression (improved on zoloft).     Objective:  Physical Exam: Filed Vitals:   04/19/15 1638  BP: 145/76  Pulse: 67  Temp: 98.1 F (36.7 C)  TempSrc: Oral  Weight: 89 lb 3.2 oz (40.461 kg)  SpO2: 98%    Physical Exam  Constitutional: She is oriented to person, place, and time. No distress.  Thin appearing   HENT:  Head: Normocephalic and atraumatic.  Right Ear: External ear normal.  Left Ear: External ear normal.  Nose: Nose normal.  Mouth/Throat: Oropharynx is clear and moist. No oropharyngeal exudate.  Eyes: Conjunctivae and EOM are normal. Pupils are equal, round, and reactive to light. Right eye exhibits no discharge. Left eye exhibits no discharge. No scleral icterus.  Neck: Normal range of motion.  Neck supple.  Cardiovascular: Normal rate, regular rhythm and normal heart sounds.   No murmur heard. Pulmonary/Chest: Effort normal and breath sounds normal. No respiratory distress. She has no wheezes. She has no rales.  Abdominal: Soft. Bowel sounds are normal. She exhibits no distension. There is no tenderness. There is no rebound and no guarding.  Musculoskeletal: Normal range of motion. She exhibits tenderness (Right paraspinal tenderness). She exhibits no edema.  Neurological: She is alert and oriented to person, place, and time.  Positive right sided straight leg test. Normal 5/5 LE muscle strength. Normal sensation to light touch of LE extremities.   Skin: Skin is warm and dry. No rash noted. She is not diaphoretic. No erythema. No pallor.  Inflammatory acne on face and upper back   Psychiatric: She has a normal mood and affect. Her behavior is normal. Judgment and thought  content normal.    Assessment & Plan:   Please see problem list for problem-based assessment and plan

## 2015-04-22 NOTE — Progress Notes (Signed)
Medicine attending: Medical history, presenting problems, physical findings, and medications, reviewed with resident physician Dr Marjan Rabbani on the day of the patient visit and I concur with her evaluation and management plan. 

## 2015-05-09 ENCOUNTER — Other Ambulatory Visit: Payer: Self-pay | Admitting: Internal Medicine

## 2015-05-10 NOTE — Telephone Encounter (Signed)
Last appt 04/19/15; no f/u appt.

## 2015-05-20 ENCOUNTER — Other Ambulatory Visit: Payer: Self-pay | Admitting: Internal Medicine

## 2015-06-07 ENCOUNTER — Other Ambulatory Visit: Payer: Self-pay

## 2015-06-07 MED ORDER — METHOCARBAMOL 500 MG PO TABS: ORAL_TABLET | ORAL | Status: DC

## 2015-06-07 NOTE — Telephone Encounter (Signed)
Sent request to PCP

## 2015-06-07 NOTE — Telephone Encounter (Signed)
Patient is calling for Robaxin 500 mg

## 2015-07-22 ENCOUNTER — Other Ambulatory Visit: Payer: Self-pay | Admitting: Internal Medicine

## 2015-07-23 ENCOUNTER — Other Ambulatory Visit: Payer: Self-pay

## 2015-07-23 DIAGNOSIS — M5417 Radiculopathy, lumbosacral region: Secondary | ICD-10-CM

## 2015-07-23 NOTE — Telephone Encounter (Signed)
Last OV/refill 04/19/15 UDS 12/2014 No future appointments

## 2015-07-23 NOTE — Telephone Encounter (Signed)
Patient is calling about pain medication

## 2015-07-24 ENCOUNTER — Other Ambulatory Visit: Payer: Self-pay | Admitting: Internal Medicine

## 2015-07-24 DIAGNOSIS — M5417 Radiculopathy, lumbosacral region: Secondary | ICD-10-CM

## 2015-07-24 MED ORDER — HYDROCODONE-ACETAMINOPHEN 5-325 MG PO TABS: 1.0000 | ORAL_TABLET | Freq: Four times a day (QID) | ORAL | Status: DC | PRN

## 2015-07-24 NOTE — Telephone Encounter (Signed)
No answer

## 2015-07-26 ENCOUNTER — Other Ambulatory Visit: Payer: Self-pay

## 2015-07-26 NOTE — Telephone Encounter (Signed)
Please call pt back regarding pain med.  

## 2015-07-26 NOTE — Telephone Encounter (Signed)
Notified patient that prescription was ready for pick up

## 2015-08-27 ENCOUNTER — Encounter: Payer: Self-pay | Admitting: *Deleted

## 2015-09-18 ENCOUNTER — Other Ambulatory Visit: Payer: Self-pay | Admitting: Internal Medicine

## 2015-09-18 DIAGNOSIS — M5417 Radiculopathy, lumbosacral region: Secondary | ICD-10-CM

## 2015-11-01 ENCOUNTER — Encounter: Payer: Self-pay | Admitting: Internal Medicine

## 2015-11-01 ENCOUNTER — Ambulatory Visit (INDEPENDENT_AMBULATORY_CARE_PROVIDER_SITE_OTHER): Payer: Medicaid Other | Admitting: Internal Medicine

## 2015-11-01 VITALS — BP 142/77 | HR 88 | Temp 98.0°F | Ht 62.0 in | Wt 96.7 lb

## 2015-11-01 DIAGNOSIS — M5441 Lumbago with sciatica, right side: Secondary | ICD-10-CM | POA: Diagnosis not present

## 2015-11-01 DIAGNOSIS — M5417 Radiculopathy, lumbosacral region: Secondary | ICD-10-CM

## 2015-11-01 DIAGNOSIS — Z0000 Encounter for general adult medical examination without abnormal findings: Secondary | ICD-10-CM

## 2015-11-01 DIAGNOSIS — G8929 Other chronic pain: Secondary | ICD-10-CM | POA: Diagnosis present

## 2015-11-01 MED ORDER — HYDROCODONE-ACETAMINOPHEN 5-325 MG PO TABS: 1.0000 | ORAL_TABLET | Freq: Four times a day (QID) | ORAL | 0 refills | Status: DC | PRN

## 2015-11-01 NOTE — Assessment & Plan Note (Signed)
Patient declined flu vaccine today.

## 2015-11-01 NOTE — Assessment & Plan Note (Addendum)
A: Patient here today asking for referral back to orthopedic surgery ([facility]) for continued corticosteroid injections for her lumbosacral radiculopathy and hip bursitis.  She has been compliant with her opiate prescriptions per the  database and is asking today for a refill of her pain medications.  She is contracted for Norco 5-325 #120 pills per month.  Last fill was 10/01/2015.  Pain described as sharp, constant, radiating down her leg.  Rated as 10/10 at its worst.  No alarm symptoms.  P: - no alarm symptoms such as incontinence, saddle anesthesia.   Pain is unchanged from previous.  - 1 refill given for her pain medications.  She has appt with her PCP on 11/13/2015 for further management.   Last UDS was October 2016 and was appropriate. - discussed potential for other therapeutic options such as yoga, tai chi, acupuncture, PT/OT - referral placed for orthopedic surgery today.

## 2015-11-01 NOTE — Progress Notes (Signed)
   CC: here for f/u of low back pain and right hip pain  HPI:  [name] is a 35 y.o. woman with a past medical history listed below here today for follow up of her back pain and hip pain.  For details of today's visit and the status of her chronic medical issues please refer to the assessment and plan.   Past Medical History:  Diagnosis Date  . Abnormal uterine bleeding 02/04/2012   Occurred 01/12/12-01/15/12. Evaluated in [facility] and all labs, exam, and pelvic u/s WNL. Given rx for ibuprofen and told to follow up as otupatient.    . Chronic back pain   . Neuromuscular disorder ([place])   . Tobacco abuse     Review of Systems:   Review of Systems  Constitutional: Negative for chills and fever.  Musculoskeletal: Positive for back pain and joint pain. Negative for falls.       Positive for radiculopathy. Negative for incontinence or saddle anesthesia.  Neurological: Negative for dizziness and loss of consciousness.     Physical Exam:  Vitals:   11/01/15 1009  BP: (!) 142/77  Pulse: 88  Temp: 98 F (36.7 C)  TempSrc: Oral  SpO2: 99%  Weight: 96 lb 11.2 oz (43.9 kg)  Height: '5\' 2"'$  (1.575 m)   Physical Exam  Constitutional: She is oriented to person, place, and time.  Pleasant, young woman, accompanied by young child.  No distress.  Comfortable appearing.  Ambulating well.  HENT:  Head: Normocephalic and atraumatic.  Eyes: EOM are normal.  Pulmonary/Chest: Effort normal.  Neurological: She is alert and oriented to person, place, and time. Gait normal.  Skin:  Acne scars present.  Psychiatric: Mood and affect normal.    Assessment & Plan:   See Encounters Tab for problem based charting.  Patient discussed with Dr. Daryll Drown  Lumbosacral radiculopathy A: Patient here today asking for referral back to orthopedic surgery ([facility]) for continued corticosteroid injections for her lumbosacral radiculopathy and hip bursitis.  She has been compliant with her  opiate prescriptions per the [facility] database and is asking today for a refill of her pain medications.  She is contracted for Norco 5-325 #120 pills per month.  Last fill was 10/01/2015.  Pain described as sharp, constant, radiating down her leg.  Rated as 10/10 at its worst.  No alarm symptoms.  P: - no alarm symptoms such as incontinence, saddle anesthesia.   Pain is unchanged from previous.  - 1 refill given for her pain medications.  She has appt with her PCP on 11/13/2015 for further management.   Last UDS was October 2016 and was appropriate. - discussed potential for other therapeutic options such as yoga, tai chi, acupuncture, PT/OT - referral placed for orthopedic surgery today.  Healthcare maintenance Patient declined flu vaccine today.

## 2015-11-01 NOTE — Patient Instructions (Signed)
Thank you for coming to see me today. It was a pleasure. Today we talked about:   Back and Hip pain: - I have referred you to your orthopedic surgeon for continued steroid injections. - I have given you a 1 month supply of your pain medication.   Please follow-up with your Primary Doctor as scheduled in a couple weeks.  If you have any questions or concerns, please do not hesitate to call the office at [phone].  Take Care,   Jule Ser, DO

## 2015-11-07 NOTE — Progress Notes (Signed)
Internal Medicine Clinic Attending  Case discussed with Dr. Wallace soon after the resident saw the patient.  We reviewed the resident's history and exam and pertinent patient test results.  I agree with the assessment, diagnosis, and plan of care documented in the resident's note. 

## 2015-11-08 ENCOUNTER — Other Ambulatory Visit: Payer: Self-pay | Admitting: *Deleted

## 2015-11-08 NOTE — Telephone Encounter (Signed)
Updated Medicaid Prior Approval Criteria for Opioid Analgesics Effective 10/27/2015, prior approval will be required for opioid analgesic (including tramadol) doses for [place] Medicaid which: 1. Exceeds 120mg  of morphine equivalents (MME) per day 2. Are greater that a 14-day supply of any opioid, or 3. Are non-preferred opioid products on the [place] Medicaid Preferred Drug List (PDL)   Form completed by MD and records were faxed to [facility]... Turn around time is 24-48 business days.[name]9/8/20172:12 PM

## 2015-11-11 ENCOUNTER — Emergency Department (HOSPITAL_COMMUNITY)
Admission: EM | Admit: 2015-11-11 | Discharge: 2015-11-11 | Disposition: A | Discharge status: Home / Self Care | Payer: Medicaid Other | Attending: Emergency Medicine | Admitting: Emergency Medicine

## 2015-11-11 ENCOUNTER — Encounter (HOSPITAL_COMMUNITY): Payer: Self-pay | Admitting: Emergency Medicine

## 2015-11-11 DIAGNOSIS — F17210 Nicotine dependence, cigarettes, uncomplicated: Secondary | ICD-10-CM | POA: Insufficient documentation

## 2015-11-11 DIAGNOSIS — M79604 Pain in right leg: Secondary | ICD-10-CM | POA: Insufficient documentation

## 2015-11-11 MED ORDER — KETOROLAC TROMETHAMINE 60 MG/2ML IM SOLN
60.0000 mg | Freq: Once | INTRAMUSCULAR | Status: AC
  Administered 2015-11-11: 60 mg via INTRAMUSCULAR
  Filled 2015-11-11: qty 2

## 2015-11-11 NOTE — ED Provider Notes (Signed)
[facility] DEPT Provider Note   CSN: [id number] Arrival date & time: 11/11/15  [id number]     History   Chief Complaint Chief Complaint  Patient presents with  . Leg Pain    HPI Melissa Walker is a 35 y.o. female.  HPI  Patient has PMH of chronic back pain and neuromuscular disorder. She typically takes Hydrocodone but ran out. She saw her PCP who wrote her a prescription but the pharmacist refused to fill it unless her physician calls in and he has not. She has been taking her Robaxin which somewhat helps and use a heating pad. Her right glut down into her mid thigh is having pain and spasm.  No N/V/D, fevers, weakness, numbness, CP, SOB   Past Medical History:  Diagnosis Date  . Abnormal uterine bleeding 02/04/2012   Occurred 01/12/12-01/15/12. Evaluated in MAU and all labs, exam, and pelvic u/s WNL. Given rx for ibuprofen and told to follow up as otupatient.    . Chronic back pain   . Neuromuscular disorder ([place])   . Tobacco abuse     Patient Active Problem List   Diagnosis Date Noted  . Adult acne 12/04/2014  . Cholelithiasis 10/26/2014  . Anxiety and depression 10/26/2014  . Healthcare maintenance 10/26/2014  . Underweight 10/26/2014  . HNP (herniated nucleus pulposus), lumbar 07/23/2014  . Lumbosacral radiculopathy 04/13/2008  . TOBACCO ABUSE 02/02/2006    Past Surgical History:  Procedure Laterality Date  . DENTAL REHABILITATION     full set dentures  . LUMBAR LAMINECTOMY/DECOMPRESSION MICRODISCECTOMY Right 07/23/2014   Procedure: Right Lumbar five-Sacral one laminotomy and microdiskectomy;  Surgeon: Jovita Gamma, MD;  Location: [facility];  Service: Neurosurgery;  Laterality: Right;  Right Lumbar five-Sacral one laminotomy and microdiskectomy  . Multiple epidural steroid injections in back     Over past 2 years  . TUBAL LIGATION Bilateral 05/11/2013   Procedure: POST PARTUM TUBAL LIGATION;  Surgeon: Osborne Oman, MD;  Location: [facility];  Service:  Gynecology;  Laterality: Bilateral;    OB History    Gravida Para Term Preterm AB Living   2 2 2     2    SAB TAB Ectopic Multiple Live Births           2       Home Medications    Prior to Admission medications   Medication Sig Start Date End Date Taking? Authorizing Provider  Benzoyl Peroxide 2.5 % CREA Apply topically every morning. 12/03/14   Shela Leff, MD  clindamycin (CLINDAGEL) 1 % gel Apply topically 2 (two) times daily. Apply in the morning 04/19/15   Juluis Mire, MD  doxycycline (VIBRAMYCIN) 100 MG capsule Take 1 capsule (100 mg total) by mouth 2 (two) times daily. 04/19/15   Juluis Mire, MD  HYDROcodone-acetaminophen (NORCO) 5-325 MG tablet Take 1 tablet by mouth every 6 (six) hours as needed for moderate pain. 11/01/15   Jule Ser, DO  methocarbamol (ROBAXIN) 500 MG tablet TAKE 2 TABLETS(1000 MG) BY MOUTH FOUR TIMES DAILY 09/20/15   Alphonzo Grieve, MD  sertraline (ZOLOFT) 50 MG tablet TAKE 2 TABLETS(100 MG) BY MOUTH DAILY 07/22/15   Juluis Mire, MD  tretinoin (RETIN-A) 0.025 % cream Apply topically at bedtime. 12/03/14   Shela Leff, MD    Family History Family History  Problem Relation Age of Onset  . Cancer Father     brain tumor   . Eclampsia Maternal Grandmother   . Heart attack Maternal Grandmother   . Cancer  Maternal Grandmother     lung  . Heart disease Maternal Grandmother   . Hyperlipidemia Maternal Grandmother   . Hypertension Maternal Grandmother     Social History Social History  Substance Use Topics  . Smoking status: Current Every Day Smoker    Packs/day: 1.00    Years: 18.00    Types: Cigarettes  . Smokeless tobacco: Never Used     Comment: TRYING TO QUIT/ LESS THAN 1/2 PPD  . Alcohol use 0.0 oz/week     Comment: occasional     Allergies   Penicillins and Nicotine   Review of Systems Review of Systems Review of Systems All other systems negative except as documented in the HPI. All pertinent positives and  negatives as reviewed in the HPI.   Physical Exam Updated Vital Signs BP 164/87   Pulse 83   Temp 97.9 F (36.6 C) (Oral)   Resp 18   Ht 5\' 3"  (1.6 m)   Wt 43.5 kg   LMP 11/04/2015 (Approximate)   SpO2 100%   BMI 17.01 kg/m   Physical Exam  Constitutional: She appears well-developed and well-nourished. No distress.  HENT:  Head: Normocephalic and atraumatic.  Eyes: Pupils are equal, round, and reactive to light.  Neck: Normal range of motion. Neck supple.  Cardiovascular: Normal rate and regular rhythm.   Pulmonary/Chest: Effort normal.  Abdominal: Soft.  Musculoskeletal:  Symmetrical and physiologic strength to bilateral lower extremities.  Neurosensory function adequate to both legs Skin color is normal. Skin is warm and moist.  No step off deformity appreciated and no midline bony tenderness.  Ambulatory  No crepitus, laceration, effusion, induration, lesions Pedal pulses are symmetrical and palpable bilaterally  No tenderness to midline, positive tenderness to paraspinal muscles-- to right hip and right mid thigh. No clonus on dorsiflextion   Neurological: She is alert.  Skin: Skin is warm and dry.  Nursing note and vitals reviewed.    ED Treatments / Results  Labs (all labs ordered are listed, but only abnormal results are displayed) Labs Reviewed - No data to display  EKG  EKG Interpretation None       Radiology No results found.  Procedures Procedures (including critical care time)  Medications Ordered in ED Medications  ketorolac (TORADOL) injection 60 mg (not administered)     Initial Impression / Assessment and Plan / ED Course  I have reviewed the triage vital signs and the nursing notes.  Pertinent labs & imaging results that were available during my care of the patient were reviewed by me and considered in my medical decision making (see chart for details).  Clinical Course    35 y.o.Melissa Walker's  with back pain.   No  neurological deficits and normal neuro exam. No loss of bowel or bladder control. No concern for cauda equina at this time base on HPI and physical exam findings. No fever, night sweats, weight loss, h/o cancer, IVDU. The patient can walk with some discomfort.   Patient Plan 1. Medications: NSAIDs and/or muscle relaxer. Cont usual home medications unless otherwise directed. 2. Treatment: rest, drink plenty of fluids, gentle stretching as discussed, alternate ice and heat  3. Follow Up: Please followup with your primary doctor for discussion of your diagnoses and further evaluation after today's visit; if you do not have a primary care doctor use the resource guide provided to find one  Advised to follow-up with the orthopedist if symptoms do not start to resolve in the next 2-3 days.  If develop loss of bowel or urinary control return to the ED as soon as possible for further evaluation. To take the medications as prescribed as they can cause harm if not taken appropriately.   Vital signs are stable at discharge. Vitals:   11/11/15 0326 11/11/15 0330  BP: (!) 160/105 164/87  Pulse: 72 83  Resp: 18   Temp: 97.9 F (36.6 C)     Patient/guardian has voiced understanding and agreed to follow-up with the PCP or specialist.      Final Clinical Impressions(s) / ED Diagnoses   Final diagnoses:  Right leg pain    New Prescriptions New Prescriptions   No medications on file     Delos Haring, PA-C 11/11/15 0424    Rolland Porter, MD 11/11/15 0710

## 2015-11-11 NOTE — ED Triage Notes (Signed)
Pt arrives with R sided leg pain that is chronic in nature, states hydrocodone, Robaxin typically help with pain - states out of hydrocodone d/t MD not filling it. States R leg numb.

## 2015-11-11 NOTE — ED Notes (Signed)
Family at bedside. 

## 2015-11-11 NOTE — Telephone Encounter (Signed)
Call made to [facility] approved through March 7th 2018.  Message left on recorder that rx has been approved.Regenia Skeeter, Marwah Disbro Cassady9/11/201712:00 PM

## 2015-11-12 ENCOUNTER — Telehealth: Payer: Self-pay | Admitting: Internal Medicine

## 2015-11-12 NOTE — Telephone Encounter (Signed)
APT, REMINDER CALL, LMTCB °

## 2015-11-13 ENCOUNTER — Ambulatory Visit (INDEPENDENT_AMBULATORY_CARE_PROVIDER_SITE_OTHER): Payer: Medicaid Other | Admitting: Internal Medicine

## 2015-11-13 ENCOUNTER — Encounter: Payer: Self-pay | Admitting: Internal Medicine

## 2015-11-13 VITALS — BP 157/75 | HR 70 | Temp 98.3°F | Ht 62.0 in | Wt 95.4 lb

## 2015-11-13 DIAGNOSIS — Z720 Tobacco use: Secondary | ICD-10-CM

## 2015-11-13 DIAGNOSIS — F17210 Nicotine dependence, cigarettes, uncomplicated: Secondary | ICD-10-CM

## 2015-11-13 DIAGNOSIS — Z87891 Personal history of nicotine dependence: Secondary | ICD-10-CM | POA: Diagnosis not present

## 2015-11-13 DIAGNOSIS — L709 Acne, unspecified: Secondary | ICD-10-CM | POA: Diagnosis not present

## 2015-11-13 DIAGNOSIS — Z716 Tobacco abuse counseling: Secondary | ICD-10-CM | POA: Diagnosis not present

## 2015-11-13 DIAGNOSIS — M5417 Radiculopathy, lumbosacral region: Secondary | ICD-10-CM

## 2015-11-13 MED ORDER — HYDROCODONE-ACETAMINOPHEN 5-325 MG PO TABS: 1.0000 | ORAL_TABLET | Freq: Four times a day (QID) | ORAL | 0 refills | Status: DC | PRN

## 2015-11-13 NOTE — Progress Notes (Signed)
   CC: Back pain  HPI:  [name] is a 35 y.o. with a PMH of adult acne, lumbosacral radiculopathy and herniated disc s/p lumbar laminectomy and microdiscectomy 07/2014, anxiety and depression.   Please see problem based Assessment and Plan for status of patients chronic conditions.  Past Medical History:  Diagnosis Date  . Abnormal uterine bleeding 02/04/2012   Occurred 01/12/12-01/15/12. Evaluated in [facility]U and all labs, exam, and pelvic u/s WNL. Given rx for ibuprofen and told to follow up as otupatient.    . Chronic back pain   . Neuromuscular disorder ([place])   . Tobacco abuse     Review of Systems:   Review of Systems  Constitutional: Negative for chills and fever.  HENT: Negative for hearing loss.   Eyes: Negative for blurred vision and double vision.  Respiratory: Negative for cough, hemoptysis, shortness of breath and wheezing.   Cardiovascular: Negative for chest pain, palpitations and leg swelling.  Gastrointestinal: Positive for diarrhea. Negative for abdominal pain, blood in stool, constipation, heartburn, melena, nausea and vomiting.  Genitourinary: Negative for dysuria, frequency, hematuria and urgency.  Musculoskeletal: Positive for back pain.  Skin: Negative for rash.  Neurological: Positive for tingling and sensory change (in R leg). Negative for headaches.    Physical Exam:  Vitals:   11/13/15 1527  BP: (!) 157/75  Pulse: 70  Temp: 98.3 F (36.8 C)  TempSrc: Oral  SpO2: 100%  Weight: 95 lb 6.4 oz (43.3 kg)  Height: 5\' 2"  (1.575 m)   Physical Exam   Constitutional: NAD, sitting in chair comfortably with legs crossed CV: RRR, no murmurs, rubs or gallops appreciated Resp: CTAB, no increased work of breathing appreciated, no wheezing or crackles Abd: soft, +BS, NDNT Ext: warm, 2+ pulses throughout, gait normal and able to get up on exam table without trouble, needing to hold on to extra support, or increased time for transfer; LE strength 5/5  bilaterally Back: at time of exam was spine, bony prominences of spine/hips/femur, and paraspinal musculature were without tenderness to palpation  Assessment & Plan:   See Encounters Tab for problem based charting.   Patient seen with Dr. Abelardo Diesel, MD Internal Medicine PGY1

## 2015-11-13 NOTE — Patient Instructions (Signed)
I have refilled your pain medicine. You will have to be seen in the clinic before you run out of pills.   For your acne, continue the clindamycin cream in the morning and afternoon after washing your face thoroughly. Then apply the retin-A cream at night before bed after washing your face thoroughly.   It was very nice meeting you!

## 2015-11-14 NOTE — Assessment & Plan Note (Signed)
Patient with history and evidence of adult acne with excoriations on face. Patient states she completed 3 month course of doxycycline without improvement. She states she has been compliant with the clindamycin and retin-A cream, but can't specify which one she uses when. She states she has not noticed improvement.  Plan:  --continue clindamycin cream in morning and afternoon after washing face  --continue retin-A cream in evening after washing face --discouraged picking --will f/u at next visit, if still not improving, consider derm referral for retin-A

## 2015-11-14 NOTE — Assessment & Plan Note (Addendum)
Patient with history of lower back pain and radiculopathy into R leg that began after childbirth. She had an MRI 06/07/2014 which showed stable central and right paracentral disc protrusion with irritation of R S1 nerve root. Patient is s/p right L5-S1 laminectomy and microdisectomy on 07/23/2014 by Dr. Sherwood Gambler after which she had relief from pain for about a month before her symptoms began again. On f/u with Dr. Sherwood Gambler in 2016, he stated that she had residual scar tissue and R hip bursitis. Patient has been managed in the past with corticosteroid injections, PT after surgery, and Norco 5-325mg  q6hr PRN and robaxin 1000mg  QID. She has been without Norco for about a month due to problems with re-authorization for Medicaid. She visited the ED on 9/11 for pain management and received a toradol injection which helped with the pain. She states that she has had some diarrhea recently since being out of Norco, but denies incontinence (bowel or urinary), or other alarm symptoms. She endorses persistent low back pain, as well as a sharp-shooting pain in her right leg that is present in every position. She does endorse numbness if the right leg extending to her toes that is present only with sitting or laying on her back. When speaking about need for opioid management, she states that without it, she is unable to play with her children or do housework without extreme discomfort. She uses a heating pad at night on her hip which allows her to sleep. She denies sedation or change in mental status with use of Norco.   Plan: --opioid contract today; UDS - f/u results --refilled norco 5-325mg  q6hrs as needed --continue using heat; continue doing home exercises that were taught in PT --she is to f/u with ortho for corticosteroid injections --f/u on her visit with Dr. Sherwood Gambler

## 2015-11-14 NOTE — Progress Notes (Signed)
Internal Medicine Clinic Attending  I saw and evaluated the patient.  I personally confirmed the key portions of the history and exam documented by Dr. Svalina and I reviewed pertinent patient test results.  The assessment, diagnosis, and plan were formulated together and I agree with the documentation in the resident's note.  

## 2015-11-14 NOTE — Assessment & Plan Note (Signed)
Patient endorses continued tobacco use, about 1 pack per day (down from 2ppd). She states that a barrier to stopping completely is that she is by herself a lot during the day, due to limitations from her pain, and gets bored. She tries reading and keeping busy throughout the house but ultimately goes to smoking anyway. She has had allergic reactions to the nicotine patch in the past with a rash. She is aware of counseling classes for smoking cessation but is not interested at this time. She is also not interested in medical therapy to help her quit. We spoke about other ways to keep her hands busy throughout the day and she states that she has done crochet in the past but is not good at it. She stated she would think about other activities she would be able to do even at home that she enjoys for the benefit of her overall mental state and to help her quit smoking.  Plan: --made patient aware of counseling and medical resources for smoking cessation --Patient is not interested at this time --will address at f/u visits

## 2015-11-16 ENCOUNTER — Other Ambulatory Visit: Payer: Self-pay | Admitting: Internal Medicine

## 2015-11-16 DIAGNOSIS — M5417 Radiculopathy, lumbosacral region: Secondary | ICD-10-CM

## 2015-11-21 LAB — TOXASSURE SELECT,+ANTIDEPR,UR

## 2015-11-22 ENCOUNTER — Other Ambulatory Visit: Payer: Self-pay | Admitting: *Deleted

## 2015-11-22 DIAGNOSIS — Z9889 Other specified postprocedural states: Secondary | ICD-10-CM

## 2015-12-17 ENCOUNTER — Ambulatory Visit (INDEPENDENT_AMBULATORY_CARE_PROVIDER_SITE_OTHER): Payer: Medicaid Other | Admitting: Neurology

## 2015-12-17 ENCOUNTER — Other Ambulatory Visit: Payer: Self-pay | Admitting: Internal Medicine

## 2015-12-17 DIAGNOSIS — R202 Paresthesia of skin: Secondary | ICD-10-CM

## 2015-12-17 DIAGNOSIS — Z9889 Other specified postprocedural states: Secondary | ICD-10-CM

## 2015-12-17 DIAGNOSIS — M5417 Radiculopathy, lumbosacral region: Secondary | ICD-10-CM

## 2015-12-17 NOTE — Procedures (Signed)
[facility]  [address], [address]  [place], [place] [address] Tel: [phone] Fax:  [phone] Test Date:  12/17/2015  Patient: Melissa Walker DOB: 1980/12/18 Physician: Narda Amber, DO  Sex: Female Height: 5\' 3"  Ref Phys: Berle Mull, MD  ID#: [id number] Temp: 33.4C Technician: Jerilynn Mages. Dean   Patient Complaints: This is a 35 year old female with history of L5-S1 laminectomy and microdiscectomy (2016) referred for evaluation of right leg paresthesias and weakness.  NCV & EMG Findings: Extensive electrodiagnostic testing of the right lower extremity shows: 1. Right sural and superficial peroneal sensory responses within normal limits. 2. Right peroneal and tibial motor responses are within normal limits. 3. Bilateral tibial H reflex studies are symmetric and within normal limits.  Impression: This is a normal study.  In particular, there is no evidence of a generalized sensorimotor polyneuropathy, lumbosacral radiculopathy, or diffuse myopathy affecting the right lower extremity.   ___________________________ Narda Amber, DO    Nerve Conduction Studies Anti Sensory Summary Table   Stim Site NR Peak (ms) Norm Peak (ms) P-T Amp (V) Norm P-T Amp  Right Sup Peroneal Anti Sensory (Ant Lat Mall)  33.4C  12 cm    2.8 <4.5 23.6 >5  Right Sural Anti Sensory (Lat Mall)  33.4C  Calf    3.0 <4.5 23.1 >5   Motor Summary Table   Stim Site NR Onset (ms) Norm Onset (ms) O-P Amp (mV) Norm O-P Amp Site1 Site2 Delta-0 (ms) Dist (cm) Vel (m/s) Norm Vel (m/s)  Right Peroneal Motor (Ext Dig Brev)  33.4C  Ankle    2.8 <5.5 8.4 >3 B Fib Ankle 6.7 31.0 46 >40  B Fib    9.5  7.9  Poplt B Fib 1.8 10.0 56 >40  Poplt    11.3  7.5         Right Tibial Motor (Abd Hall Brev)  33.4C  Ankle    2.4 <6.0 23.6 >8 Knee Ankle 8.5 38.0 45 >40  Knee    10.9  15.7          H Reflex Studies   NR H-Lat (ms) Lat Norm (ms) L-R H-Lat (ms) M-Lat (ms) HLat-MLat (ms)  Left Tibial (Gastroc)   33.4C     30.20 <35 0.41 4.22 25.98  Right Tibial (Gastroc)  33.4C     29.80 <35 0.41 4.22 25.58   EMG   Side Muscle Ins Act Fibs Psw Fasc Number Recrt Dur Dur. Amp Amp. Poly Poly. Comment  Right AntTibialis Nml Nml Nml Nml Nml Nml Nml Nml Nml Nml Nml Nml N/A  Right Gastroc Nml Nml Nml Nml Nml Nml Nml Nml Nml Nml Nml Nml N/A  Right Flex Dig Long Nml Nml Nml Nml Nml Nml Nml Nml Nml Nml Nml Nml N/A  Right RectFemoris Nml Nml Nml Nml Nml Nml Nml Nml Nml Nml Nml Nml N/A  Right GluteusMed Nml Nml Nml Nml Nml Nml Nml Nml Nml Nml Nml Nml N/A  Right BicepsFemS Nml Nml Nml Nml Nml Nml Nml Nml Nml Nml Nml Nml N/A  Right GluteusMax Nml Nml Nml Nml Nml Nml Nml Nml Nml Nml Nml Nml N/A      Waveforms:

## 2016-01-13 ENCOUNTER — Other Ambulatory Visit: Payer: Self-pay | Admitting: Internal Medicine

## 2016-01-13 DIAGNOSIS — M5417 Radiculopathy, lumbosacral region: Secondary | ICD-10-CM

## 2016-02-04 ENCOUNTER — Telehealth: Payer: Self-pay | Admitting: Internal Medicine

## 2016-02-04 NOTE — Telephone Encounter (Signed)
APT. REMINDER CALL, LMTCB °

## 2016-02-05 ENCOUNTER — Encounter: Payer: Self-pay | Admitting: Internal Medicine

## 2016-02-05 ENCOUNTER — Ambulatory Visit (INDEPENDENT_AMBULATORY_CARE_PROVIDER_SITE_OTHER): Payer: Medicaid Other | Admitting: Internal Medicine

## 2016-02-05 ENCOUNTER — Encounter (INDEPENDENT_AMBULATORY_CARE_PROVIDER_SITE_OTHER): Payer: Self-pay

## 2016-02-05 VITALS — BP 130/84 | HR 80 | Temp 97.6°F | Ht 62.0 in | Wt 98.6 lb

## 2016-02-05 DIAGNOSIS — M5417 Radiculopathy, lumbosacral region: Secondary | ICD-10-CM | POA: Diagnosis not present

## 2016-02-05 DIAGNOSIS — F418 Other specified anxiety disorders: Secondary | ICD-10-CM | POA: Diagnosis not present

## 2016-02-05 DIAGNOSIS — F32A Depression, unspecified: Secondary | ICD-10-CM

## 2016-02-05 DIAGNOSIS — F17200 Nicotine dependence, unspecified, uncomplicated: Secondary | ICD-10-CM

## 2016-02-05 DIAGNOSIS — Z79891 Long term (current) use of opiate analgesic: Secondary | ICD-10-CM | POA: Diagnosis not present

## 2016-02-05 DIAGNOSIS — F329 Major depressive disorder, single episode, unspecified: Secondary | ICD-10-CM

## 2016-02-05 DIAGNOSIS — Z79899 Other long term (current) drug therapy: Secondary | ICD-10-CM

## 2016-02-05 DIAGNOSIS — L709 Acne, unspecified: Secondary | ICD-10-CM

## 2016-02-05 DIAGNOSIS — F17210 Nicotine dependence, cigarettes, uncomplicated: Secondary | ICD-10-CM | POA: Diagnosis not present

## 2016-02-05 DIAGNOSIS — L708 Other acne: Secondary | ICD-10-CM | POA: Diagnosis not present

## 2016-02-05 DIAGNOSIS — G8929 Other chronic pain: Secondary | ICD-10-CM

## 2016-02-05 DIAGNOSIS — Z716 Tobacco abuse counseling: Secondary | ICD-10-CM

## 2016-02-05 DIAGNOSIS — F419 Anxiety disorder, unspecified: Secondary | ICD-10-CM

## 2016-02-05 MED ORDER — HYDROCODONE-ACETAMINOPHEN 5-325 MG PO TABS: 1.0000 | ORAL_TABLET | Freq: Four times a day (QID) | ORAL | 0 refills | Status: DC | PRN

## 2016-02-05 NOTE — Patient Instructions (Signed)
We filled your pain medicine for 3 months.  Please continue to do the exercises and keep active!   Back Exercises The following exercises strengthen the muscles that help to support the back. They also help to keep the lower back flexible. Doing these exercises can help to prevent back pain or lessen existing pain. If you have back pain or discomfort, try doing these exercises 2-3 times each day or as told by your health care provider. When the pain goes away, do them once each day, but increase the number of times that you repeat the steps for each exercise (do more repetitions). If you do not have back pain or discomfort, do these exercises once each day or as told by your health care provider. Exercises Single Knee to Chest  Repeat these steps 3-5 times for each leg: 1. Lie on your back on a firm bed or the floor with your legs extended. 2. Bring one knee to your chest. Your other leg should stay extended and in contact with the floor. 3. Hold your knee in place by grabbing your knee or thigh. 4. Pull on your knee until you feel a gentle stretch in your lower back. 5. Hold the stretch for 10-30 seconds. 6. Slowly release and straighten your leg. Pelvic Tilt  Repeat these steps 5-10 times: 1. Lie on your back on a firm bed or the floor with your legs extended. 2. Bend your knees so they are pointing toward the ceiling and your feet are flat on the floor. 3. Tighten your lower abdominal muscles to press your lower back against the floor. This motion will tilt your pelvis so your tailbone points up toward the ceiling instead of pointing to your feet or the floor. 4. With gentle tension and even breathing, hold this position for 5-10 seconds. Cat-Cow  Repeat these steps until your lower back becomes more flexible: 1. Get into a hands-and-knees position on a firm surface. Keep your hands under your shoulders, and keep your knees under your hips. You may place padding under your knees for  comfort. 2. Let your head hang down, and point your tailbone toward the floor so your lower back becomes rounded like the back of a cat. 3. Hold this position for 5 seconds. 4. Slowly lift your head and point your tailbone up toward the ceiling so your back forms a sagging arch like the back of a cow. 5. Hold this position for 5 seconds. Press-Ups  Repeat these steps 5-10 times: 1. Lie on your abdomen (face-down) on the floor. 2. Place your palms near your head, about shoulder-width apart. 3. While you keep your back as relaxed as possible and keep your hips on the floor, slowly straighten your arms to raise the top half of your body and lift your shoulders. Do not use your back muscles to raise your upper torso. You may adjust the placement of your hands to make yourself more comfortable. 4. Hold this position for 5 seconds while you keep your back relaxed. 5. Slowly return to lying flat on the floor. Bridges  Repeat these steps 10 times: 1. Lie on your back on a firm surface. 2. Bend your knees so they are pointing toward the ceiling and your feet are flat on the floor. 3. Tighten your buttocks muscles and lift your buttocks off of the floor until your waist is at almost the same height as your knees. You should feel the muscles working in your buttocks and the back of  your thighs. If you do not feel these muscles, slide your feet 1-2 inches farther away from your buttocks. 4. Hold this position for 3-5 seconds. 5. Slowly lower your hips to the starting position, and allow your buttocks muscles to relax completely. If this exercise is too easy, try doing it with your arms crossed over your chest. Abdominal Crunches  Repeat these steps 5-10 times: 1. Lie on your back on a firm bed or the floor with your legs extended. 2. Bend your knees so they are pointing toward the ceiling and your feet are flat on the floor. 3. Cross your arms over your chest. 4. Tip your chin slightly toward your chest  without bending your neck. 5. Tighten your abdominal muscles and slowly raise your trunk (torso) high enough to lift your shoulder blades a tiny bit off of the floor. Avoid raising your torso higher than that, because it can put too much stress on your low back and it does not help to strengthen your abdominal muscles. 6. Slowly return to your starting position. Back Lifts  Repeat these steps 5-10 times: 1. Lie on your abdomen (face-down) with your arms at your sides, and rest your forehead on the floor. 2. Tighten the muscles in your legs and your buttocks. 3. Slowly lift your chest off of the floor while you keep your hips pressed to the floor. Keep the back of your head in line with the curve in your back. Your eyes should be looking at the floor. 4. Hold this position for 3-5 seconds. 5. Slowly return to your starting position. Contact a health care provider if:  Your back pain or discomfort gets much worse when you do an exercise.  Your back pain or discomfort does not lessen within 2 hours after you exercise. If you have any of these problems, stop doing these exercises right away. Do not do them again unless your health care provider says that you can. Get help right away if:  You develop sudden, severe back pain. If this happens, stop doing the exercises right away. Do not do them again unless your health care provider says that you can. This information is not intended to replace advice given to you by your health care provider. Make sure you discuss any questions you have with your health care provider. Document Released: 03/26/2004 Document Revised: 06/26/2015 Document Reviewed: 04/12/2014 Elsevier Interactive Patient Education  2017 [employer].

## 2016-02-07 ENCOUNTER — Encounter: Payer: Self-pay | Admitting: Internal Medicine

## 2016-02-07 NOTE — Progress Notes (Signed)
   CC: back pain  HPI:  [name] is a 35 y.o. with a PMH of lumbar radiculopathy, tobacco abuse and adult facial acne presents to clinic for f/u of back pain.  Please see problem based Assessment and Plan for status of patients chronic conditions.  Past Medical History:  Diagnosis Date  . Abnormal uterine bleeding 02/04/2012   Occurred 01/12/12-01/15/12. Evaluated in [facility] and all labs, exam, and pelvic u/s WNL. Given rx for ibuprofen and told to follow up as otupatient.    . Chronic back pain   . Neuromuscular disorder ([place])   . Tobacco abuse     Review of Systems:   Review of Systems  Constitutional: Negative for chills and fever.  HENT: Negative for congestion and hearing loss.   Eyes: Negative for blurred vision, double vision and discharge.  Respiratory: Negative for cough, hemoptysis and shortness of breath.   Cardiovascular: Negative for chest pain, palpitations, claudication and leg swelling.  Gastrointestinal: Negative for abdominal pain, blood in stool, constipation, diarrhea, nausea and vomiting.  Genitourinary: Negative for dysuria, frequency and hematuria.  Musculoskeletal: Positive for back pain. Negative for falls and joint pain.  Skin: Negative for rash.  Neurological: Negative for dizziness, tingling, sensory change, focal weakness, weakness and headaches.  Psychiatric/Behavioral: Negative for depression. The patient is not nervous/anxious and does not have insomnia.     Physical Exam:  Vitals:   02/05/16 1546 02/05/16 1656  BP: (!) 149/84 130/84  Pulse: 80   Temp: 97.6 F (36.4 C)   TempSrc: Oral   SpO2: 100%   Weight: 98 lb 9.6 oz (44.7 kg)   Height: 5\' 2"  (1.575 m)    Physical Exam Constitutional: NAD, pleasant, VS reviewed CV: RRR, no murmurs, rubs or gallops appreciated, no LE edema, pulses intact throughout Resp: CTAB, no increased work of breathing, no wheezing or crackles appreciated Abd: soft, +BS, NDNT MSK: no spinal or  paraspinal tenderness; mild right SI joint tenderness; bilateral LE strength and sensation intact. DTR intact. Skin: ~5 comedones with evidence of excoriation on face; no erythema, edema or drainage concerning for further infection.  Assessment & Plan:   See Encounters Tab for problem based charting.   Patient seen with Dr. Verdie Drown, MD Internal Medicine PGY1

## 2016-02-07 NOTE — Assessment & Plan Note (Addendum)
Patient with history of lower back pain and radiculopathy into the right leg that began after childbirth and a fall. Since last visit, she has seen Dr. Alfonso Ramus of ortho and Dr. Posey Pronto with neurology. EMG study on 12/17/15 did not show any neural or muscular abnormalities. In the past she has not had any improvement with steroid injections so those were not attempted again at visit with Dr. Alfonso Ramus. Since MRI and EMG studies unremarkable, likely no surgical intervention at this time. She has no signs or evidence of bursitis either. Patient states that she has been compliant with exercises and stretches at home that she has been shown in PT previously. She states that she uses Norco about 4 times per day; she sleeps with heating pad on back which helps her pain greatly and allows her to sleep through the night without pain.   Plan: --refill Norco 5-325mg  q6hrs PRN #120 x 3 months --consider switching to duloxetine from Zoloft to address both depression and pain

## 2016-02-07 NOTE — Assessment & Plan Note (Signed)
Patient endorses continued tobacco use and is working of cutting down the number of cigarettes she is smoking in a day; currently is at ~0.5ppd (down from 1ppd three months ago). She again declines medical therapy or counseling.  Plan: --encouraged patient to continue her progress --will address at further visits

## 2016-02-07 NOTE — Assessment & Plan Note (Signed)
Patient with history and evidence of adult facial acne with excoriations. Patient was advised on proper use of clindamycin creme and retin-A creme at last visit. She states that she has seen improvement. Currently has about 5 comedones with evidence of excoriation.  Plan: --continue retin-A creme and clindamycin creme --continue encouraging facial hygiene and avoidance of picking

## 2016-02-07 NOTE — Progress Notes (Signed)
Medicine attending: I personally interviewed and briefly examined this patient on the day of the patient visit and reviewed pertinent clinical ,laboratory, and radiographic data  with resident physician Dr. Alphonzo Grieve and we discussed a management plan.exam  [facility] with chronic back pain. No significant improvement after spinal surgery. I conformed No focal neuro deficits on exam. Continue medical management.

## 2016-02-07 NOTE — Assessment & Plan Note (Signed)
Patient with anxiety and depression compliant with Zoloft, which she states controls her mood well.   Plan: --continue zoloft

## 2016-02-13 ENCOUNTER — Other Ambulatory Visit: Payer: Self-pay | Admitting: Internal Medicine

## 2016-02-13 DIAGNOSIS — M5417 Radiculopathy, lumbosacral region: Secondary | ICD-10-CM

## 2016-02-27 ENCOUNTER — Encounter: Payer: Self-pay | Admitting: Internal Medicine

## 2016-02-27 DIAGNOSIS — Z79891 Long term (current) use of opiate analgesic: Secondary | ICD-10-CM | POA: Insufficient documentation

## 2016-02-27 DIAGNOSIS — F1120 Opioid dependence, uncomplicated: Secondary | ICD-10-CM | POA: Insufficient documentation

## 2016-02-27 HISTORY — DX: Opioid dependence, uncomplicated: F11.20

## 2016-03-27 ENCOUNTER — Telehealth: Payer: Self-pay | Admitting: Internal Medicine

## 2016-03-27 NOTE — Telephone Encounter (Signed)
APPT. REMINDER CALL, LMTCB °

## 2016-03-30 ENCOUNTER — Ambulatory Visit (INDEPENDENT_AMBULATORY_CARE_PROVIDER_SITE_OTHER): Payer: Medicaid Other | Admitting: Internal Medicine

## 2016-03-30 VITALS — BP 147/86 | HR 78 | Temp 97.7°F | Ht 63.0 in | Wt 98.8 lb

## 2016-03-30 DIAGNOSIS — R946 Abnormal results of thyroid function studies: Secondary | ICD-10-CM | POA: Insufficient documentation

## 2016-03-30 DIAGNOSIS — R61 Generalized hyperhidrosis: Secondary | ICD-10-CM

## 2016-03-30 HISTORY — DX: Generalized hyperhidrosis: R61

## 2016-03-30 NOTE — Patient Instructions (Signed)
It was a pleasure to see you today Melissa Walker.  I do not think your mild thyroid test abnormality explains these ongoing night sweats and a persistently enlarged neck lymph node. I think we need to pursue getting a biopsy of this nodule and identify if there is some chronic infection or other problem going on.  I would also like to recheck some blood tests to see  If there is a problem elsewhere contributing to your symptoms.  I will call you back later this week after getting these test results and make sure we get progress on evaluating this nodule.

## 2016-03-30 NOTE — Assessment & Plan Note (Signed)
HPI: Her night sweats are occurring every night for an estimated 3-4 weeks but she cannot remember an exact onset. This does wake her up to twice per night. She never has these symptoms during the day. She has not noticed a recent weight change along with this. Her weight is slightly increased compared to last year but her baseline at that time was severely underweight at BMI of of 14.5. She has a single enlarged lymph node on the left side of her neck that she does not recall when it developed. She denies any other systemic symptoms such as diarrhea, dysuria, or fevers. At her recent urgent care visit thyroid function was tested and slightly abnormal. CBC at that time was unremarkable.  A: Night sweats without specific obvious etiology at this time that suggest a persistent low grade infection or concerning for B type symptoms. She is fairly young   P: -Current symptoms are very unlikely to come from a mild thyroid deficiency -I think the most prudent course is pursuing excisional biopsy of this lymph node to exclude any neoplastic cause so I will refer to general surgery today. -Checking CMP today screening for any systemic organ function abnormalities -If lymph node biopsy is nondiagnostic or suggests infection I would strongly consider evaluation for STIs or other fastidious organisms

## 2016-03-30 NOTE — Progress Notes (Signed)
   CC: Night sweats  HPI:  Melissa Walker is a 36 y.o. woman here for follow up of a recent urgent care visit for night sweats and palpitations where she was told her thyroid function is abnormal.  See problem based assessment and plan below for additional details  Past Medical History:  Diagnosis Date  . Abnormal uterine bleeding 02/04/2012   Occurred 01/12/12-01/15/12. Evaluated in [facility] and all labs, exam, and pelvic u/s WNL. Given rx for ibuprofen and told to follow up as otupatient.    . Chronic back pain   . Neuromuscular disorder ([place])   . Tobacco abuse     Review of Systems:  Review of Systems  Constitutional: Positive for diaphoresis. Negative for fever and weight loss.  HENT: Negative for sore throat.   Eyes: Negative for blurred vision.  Respiratory: Negative for shortness of breath.   Cardiovascular: Negative for chest pain.  Gastrointestinal: Negative for diarrhea.  Genitourinary: Negative for dysuria.  Skin: Negative for rash.  Neurological: Negative for dizziness.  Psychiatric/Behavioral: The patient is not nervous/anxious.     Physical Exam: Physical Exam  Constitutional:  Thin woman in no acute distress, not diaphoretic  HENT:  Head: Normocephalic.  Mouth/Throat: No oropharyngeal exudate.  Eyes: Conjunctivae are normal.  Neck:  Mobile, nontender left anterior cervical lymph node slightly less than 1cm in diameter No other lymphadenopathy, no thyromegaly or tenderness  Cardiovascular: Normal rate and regular rhythm.   Pulmonary/Chest: Effort normal and breath sounds normal.  Abdominal: Soft. There is no tenderness.  Musculoskeletal: She exhibits no edema.  Skin: Skin is warm and dry. No rash noted.    Vitals:   03/30/16 1027  BP: (!) 147/86  Pulse: 78  Temp: 97.7 F (36.5 C)  TempSrc: Oral  SpO2: 99%  Weight: 98 lb 12.8 oz (44.8 kg)  Height: 5\' 3"  (1.6 m)    Assessment & Plan:   See Encounters Tab for problem based  charting.  Patient discussed with Dr. Lynnae January

## 2016-03-30 NOTE — Assessment & Plan Note (Signed)
HPI: She does have a history of small left sided small thyroid cysts demonstrated on ultrasound in 2013. She has never had thyroid function abnormality or progression of these findings.  A: Thyroid function test at urgent care was actually within normal limits except for T3 uptake of 19% (normally > 22%) There is no obvious thyromegaly suggesting further enlargement  P: -I do not feel her very minimal thyroid abnormality explains the current symptoms of her nightsweats and lymphadenopathy and will not plan to start thyroid replacement medication today -If symptoms persists after cytological and infectious workup we should consider rechecking thyroid studies

## 2016-03-31 LAB — CMP14 + ANION GAP
A/G RATIO: 1.4 (ref 1.2–2.2)
ALT: 5 IU/L (ref 0–32)
AST: 8 IU/L (ref 0–40)
Albumin: 3.6 g/dL (ref 3.5–5.5)
Alkaline Phosphatase: 55 IU/L (ref 39–117)
Anion Gap: 13 mmol/L (ref 10.0–18.0)
BUN / CREAT RATIO: 8 — AB (ref 9–23)
BUN: 6 mg/dL (ref 6–20)
Bilirubin Total: 0.2 mg/dL (ref 0.0–1.2)
CO2: 26 mmol/L (ref 18–29)
Calcium: 9 mg/dL (ref 8.7–10.2)
Chloride: 105 mmol/L (ref 96–106)
Creatinine, Ser: 0.8 mg/dL (ref 0.57–1.00)
GFR, EST AFRICAN AMERICAN: 110 mL/min/{1.73_m2} (ref >59)
GFR, EST NON AFRICAN AMERICAN: 95 mL/min/{1.73_m2} (ref >59)
Globulin, Total: 2.6 g/dL (ref 1.5–4.5)
Glucose: 84 mg/dL (ref 65–99)
POTASSIUM: 4.5 mmol/L (ref 3.5–5.2)
Sodium: 144 mmol/L (ref 134–144)
TOTAL PROTEIN: 6.2 g/dL (ref 6.0–8.5)

## 2016-03-31 NOTE — Progress Notes (Signed)
Internal Medicine Clinic Attending  Case discussed with Dr. Rice at the time of the visit.  We reviewed the resident's history and exam and pertinent patient test results.  I agree with the assessment, diagnosis, and plan of care documented in the resident's note.  

## 2016-04-09 ENCOUNTER — Telehealth: Payer: Self-pay

## 2016-04-09 NOTE — Telephone Encounter (Signed)
Requesting lab result. Please call back. 

## 2016-04-10 NOTE — Telephone Encounter (Signed)
Tried calling back twice today without getting her on the line, second time today at 1634. A complete metabolic panel was checked and entirely normal. She does not need a change of treatment. I can send a letter if unable to reach her again.

## 2016-04-13 ENCOUNTER — Telehealth: Payer: Self-pay | Admitting: *Deleted

## 2016-04-13 NOTE — Telephone Encounter (Signed)
No answer, lm for rtc 

## 2016-04-13 NOTE — Telephone Encounter (Signed)
PATIENT CONTACTED WITH THIS APPOINTMENT WITH [facility] / 2-26-018 @ 9AM / DR. GERKIN.

## 2016-04-14 NOTE — Telephone Encounter (Signed)
Spoke to patient by phone discussed normal results.

## 2016-04-20 ENCOUNTER — Other Ambulatory Visit: Payer: Self-pay | Admitting: Internal Medicine

## 2016-04-22 IMAGING — DX DG LUMBAR SPINE 2-3V
2 series · 2 of 2 positions shown · non-contrast
Comparison: 06/07/2014

CLINICAL DATA: L5-S1 laminectomy

EXAM:
LUMBAR SPINE - 2-3 VIEW

[lat (1 of 2)]
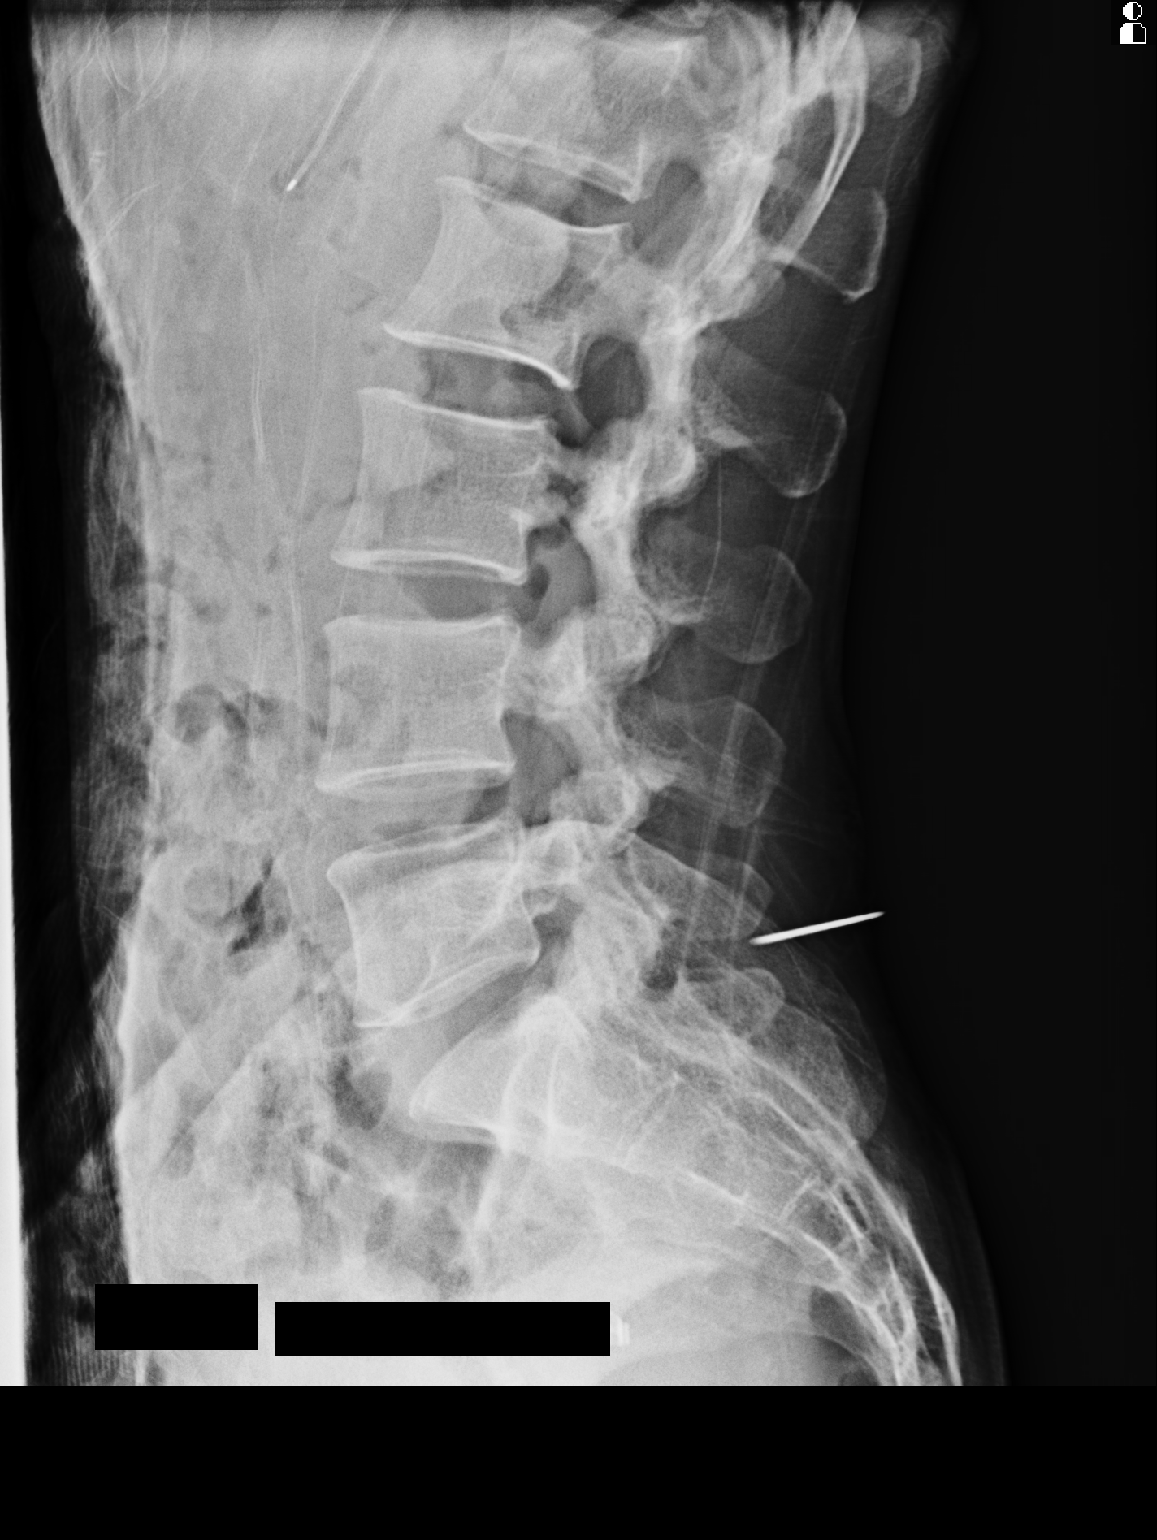

[lat (2 of 2)]
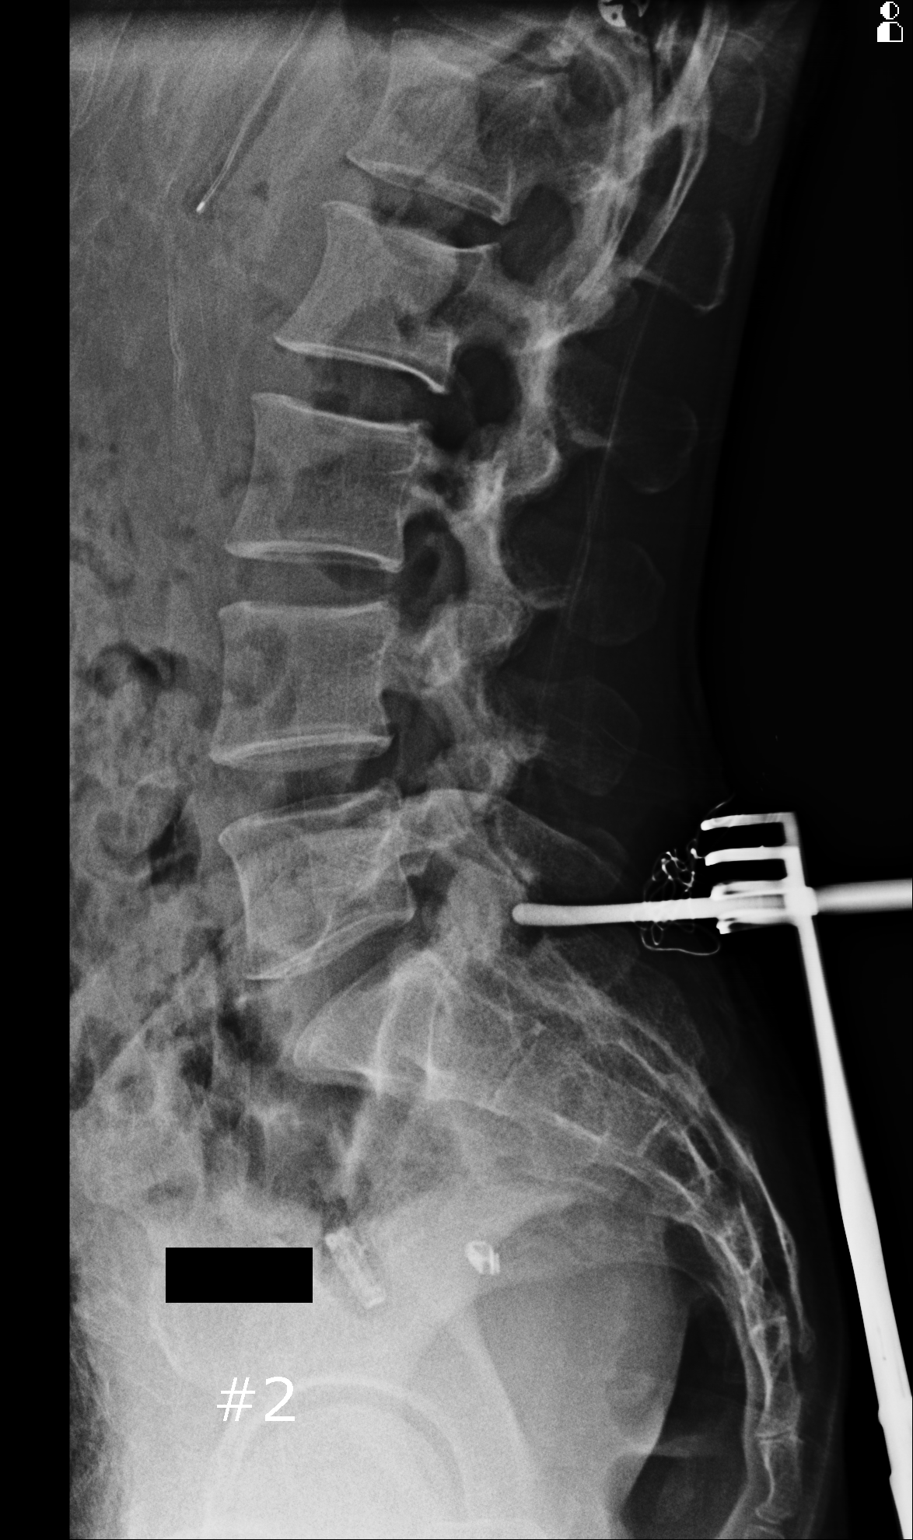

[2 of 2 positions shown; findings below may reference images not displayed]

FINDINGS: Two lateral films were obtained intraoperatively. Initial film
demonstrates a needle within the posterior soft tissues at the L5-S1
level. Subsequent image shows a surgical instrument at the level of
the posterior elements of L5 and S1.
IMPRESSION: Intraoperative localization at L5-S1

## 2016-04-27 DIAGNOSIS — R221 Localized swelling, mass and lump, neck: Secondary | ICD-10-CM | POA: Diagnosis not present

## 2016-04-30 ENCOUNTER — Other Ambulatory Visit: Payer: Self-pay | Admitting: Surgery

## 2016-04-30 ENCOUNTER — Other Ambulatory Visit: Payer: Self-pay | Admitting: Internal Medicine

## 2016-04-30 DIAGNOSIS — R221 Localized swelling, mass and lump, neck: Secondary | ICD-10-CM

## 2016-04-30 DIAGNOSIS — M5417 Radiculopathy, lumbosacral region: Secondary | ICD-10-CM

## 2016-05-06 ENCOUNTER — Ambulatory Visit
Admission: RE | Admit: 2016-05-06 | Discharge: 2016-05-06 | Disposition: A | Discharge status: Home / Self Care | Payer: Medicaid Other | Source: Ambulatory Visit | Attending: Surgery | Admitting: Surgery

## 2016-05-06 DIAGNOSIS — R221 Localized swelling, mass and lump, neck: Secondary | ICD-10-CM

## 2016-05-25 ENCOUNTER — Other Ambulatory Visit: Payer: Self-pay

## 2016-05-25 DIAGNOSIS — Z79891 Long term (current) use of opiate analgesic: Secondary | ICD-10-CM

## 2016-05-25 DIAGNOSIS — M5417 Radiculopathy, lumbosacral region: Secondary | ICD-10-CM

## 2016-05-25 NOTE — Telephone Encounter (Signed)
HYDROcodone-acetaminophen (NORCO) 5-325 MG tablet, refill request.  

## 2016-05-26 MED ORDER — HYDROCODONE-ACETAMINOPHEN 5-325 MG PO TABS: 1.0000 | ORAL_TABLET | Freq: Four times a day (QID) | ORAL | 0 refills | Status: DC | PRN

## 2016-05-26 NOTE — Telephone Encounter (Signed)
Rxs ready - pt called ; no answer, left message.

## 2016-05-26 NOTE — Assessment & Plan Note (Addendum)
Indication for chronic opioid: chronic lubosacral pain s/p surgery Medication and dose: hydrocodone-acetaminophen 5-325mg  q6hr PRN # pills per month: 120 Last UDS date: 9/17 Pain contract signed (Y/N): yes Date narcotic database last reviewed (include red flags): 3/27

## 2016-06-03 ENCOUNTER — Encounter: Payer: Self-pay | Admitting: Internal Medicine

## 2016-06-03 ENCOUNTER — Telehealth: Payer: Self-pay | Admitting: *Deleted

## 2016-06-03 ENCOUNTER — Ambulatory Visit (INDEPENDENT_AMBULATORY_CARE_PROVIDER_SITE_OTHER): Payer: Medicaid Other | Admitting: Internal Medicine

## 2016-06-03 VITALS — BP 149/83 | HR 86 | Temp 97.6°F | Ht 62.0 in | Wt 95.8 lb

## 2016-06-03 DIAGNOSIS — G8929 Other chronic pain: Secondary | ICD-10-CM

## 2016-06-03 DIAGNOSIS — L709 Acne, unspecified: Secondary | ICD-10-CM | POA: Diagnosis not present

## 2016-06-03 DIAGNOSIS — M5417 Radiculopathy, lumbosacral region: Secondary | ICD-10-CM

## 2016-06-03 DIAGNOSIS — Z79891 Long term (current) use of opiate analgesic: Secondary | ICD-10-CM

## 2016-06-03 MED ORDER — LORATADINE 10 MG PO TABS: 10.0000 mg | ORAL_TABLET | Freq: Every day | ORAL | 0 refills | Status: DC

## 2016-06-03 MED ORDER — MELOXICAM 7.5 MG PO TABS
7.5000 mg | ORAL_TABLET | Freq: Every day | ORAL | 0 refills | Status: DC
Start: 2016-06-03 — End: 2018-06-20

## 2016-06-03 MED ORDER — DICLOFENAC SODIUM 1 % TD GEL: 4.0000 g | Freq: Four times a day (QID) | TRANSDERMAL | 0 refills | Status: DC

## 2016-06-03 NOTE — Telephone Encounter (Signed)
PA request for Norco submitted online via [place] [employer]-decision pending review.[name]4/4/20181:40 PM    [phone] [facility] [id number] P Joelyn Roeder 06/03/2016  APPROVED 06/03/2016 - 11/30/2016 DMA .[name]4/4/20181:51 PM

## 2016-06-03 NOTE — Telephone Encounter (Signed)
Pt was rx'd voltaren gel during today's visit with MD. Per medicaid,  back pain is not an approved diagnosis, so authorization was denied.  Will forward info to ordering MD for an alternative mediation.  Please advise.Regenia Skeeter, Darlene Cassady4/4/20181:49 PM

## 2016-06-03 NOTE — Progress Notes (Signed)
   CC: Follow-up for back pain  HPI:  [name] is a 36 y.o. woman with past medical history as noted below who presents today for follow-up of her chronic back pain.  Chronic back pain: She has a history of lower back pain and radiculopathy into the right leg that began after childbirth. She had an MRI of her lumbar spine on 06/07/2014 which showed stable central and right paracentral disc protrusion with irritation of right S1 nerve root. She underwent surgery with Dr. Sherwood Gambler on 07/23/2014 for a right L5-S1 laminectomy and microdiscectomy. She has been managed on Norco 5-3 25 mg every 6 hours when necessary and Robaxin 1000 mg 4 times a day for her pain. She also receives corticosteroid injections with neurosurgery. She did complete physical therapy after her surgery but states that this did not help her very much. She does try to do exercises that she learned from PT at home. She reports lately her Norco has not helped her pain as much. She describes previously the Norco would drop her pain from a 10 out of 10 in severity to a 6 out of 10, but now her pain is staying constant. She denies any new trauma or inciting event for this change. She denies any warning symptoms, including saddle anesthesia, difficulty walking up stairs, or loss of bladder or bowel function control. Her radiculopathy still extends down her right leg only. She is also taking meloxicam 7. 5 mg daily. She has tried Aleve in the past in addition to the [place] with minimal relief.  Acne on back and face: She reports that acne on her back is not getting better. She notes intense itching and has been scratching the area. She admits she has been scratching so much that she has opened the skin. She has been using topical clindamycin cream, benzoyl peroxide cream, and tretinoin cream.    Past Medical History:  Diagnosis Date  . Abnormal uterine bleeding 02/04/2012   Occurred 01/12/12-01/15/12. Evaluated in [facility] and all labs,  exam, and pelvic u/s WNL. Given rx for ibuprofen and told to follow up as otupatient.    . Chronic back pain   . Neuromuscular disorder ([place])   . Tobacco abuse     Review of Systems:   General: Denies fever, chills, night sweats, changes in weight, changes in appetite HEENT: Denies headaches, ear pain, changes in vision, rhinorrhea, sore throat CV: Denies CP, palpitations, SOB, orthopnea Pulm: Denies SOB, cough, wheezing GI: Denies abdominal pain, nausea, vomiting, diarrhea, constipation, melena, hematochezia GU: Denies dysuria, hematuria, frequency Msk: See HPI Neuro: See HPI Skin: Denies bruising Psych: Denies depression, anxiety, hallucinations  Physical Exam:  Vitals:   06/03/16 0907  BP: (!) 149/83  Pulse: 86  Temp: 97.6 F (36.4 C)  TempSrc: Oral  SpO2: 100%  Weight: 95 lb 12.8 oz (43.5 kg)  Height: 5\' 2"  (1.575 m)   General: Thin young woman sitting up, NAD, pleasant Msk: No spinal or paraspinal tenderness. Strength 5/5 in bilateral lower extremities. Skin: Several comedones present on face and upper back. Two of the upper back comedones have been opened with evidence of excoriation. No surrounding erythema or drainage present.   Assessment & Plan:   See Encounters Tab for problem based charting.  Patient discussed with Dr. Eppie Gibson

## 2016-06-03 NOTE — Assessment & Plan Note (Signed)
Advised patient to avoid scratching at her acne lesions as much as possible. Recommended to start loratadine 10 mg daily to help with the itching as this is non-drowsy. Advised her to continue her current regimen of benzyl peroxide, clindamycin cream, tretinoin cream. If her comedones do not improve from refraining from scratching, she may require a 3-4 month course of oral antibiotics. Will have her follow-up in 2 weeks to reassess how her acne is doing.

## 2016-06-03 NOTE — Patient Instructions (Signed)
General Instructions: - Try the voltaren gel. Can apply up to 4 times daily - Increase Mobic to 2 pills daily - Use Claritin (loratidine) 10 mg daily to help with itching. This is non-drowsy. - Continue current acne regimen - AVOID SCRATCHING! - Return in 2 weeks to reassess your pain and acne   Please bring your medicines with you each time you come to clinic.  Medicines may include prescription medications, over-the-counter medications, herbal remedies, eye drops, vitamins, or other pills.   Progress Toward Treatment Goals:  No flowsheet data found.  Self Care Goals & Plans:  Self Care Goal 04/19/2015  Manage my medications take my medicines as prescribed; bring my medications to every visit  Eat healthy foods eat foods that are low in salt; eat baked foods instead of fried foods  Be physically active find an activity I enjoy  Stop smoking go to the [employer] (https://scott-book[employer].info/)    No flowsheet data found.   Care Management & Community Referrals:  No flowsheet data found.

## 2016-06-03 NOTE — Assessment & Plan Note (Addendum)
Melissa Walker is on chronic opioid therapy for chronic pain. The date of the controlled substances contract is referenced in the [place] and / or the overview. Date of pain contract was 11/14/15. As part of the treatment plan, the [place] controlled substance database is checked at least twice yearly and the database results are appropriate. I have last reviewed the results on 06/03/16.   The last UDS was on 11/13/15 and results are as expected. Patient needs at least a yearly UDS.   The patient is on hydrocodone/acetaminophen (Lorcet, Lortab, Norco, Vicodin) strength 5-325 mg Q6H PRN, 120 per 30 days. Adjunctive treatment includes Meloxicam, home PT exercises, and treatment for her depression with Zoloft. This regimen allows Melissa Walker to function and does not cause excessive sedation or other side effects.  "The benefits of continuing opioid therapy outweigh the risks and chronic opioids will be continued. Ongoing education about safe opioid treatment is provided  Interventions today include: Start voltaren gel 4 times daily Increase Meloxicam to 15 mg daily for next 2 weeks. She reports she has some leftover meloxicam at home, but I gave her a prescription for 30 pills just in case. Continue home PT exercises Will discuss with PCP and determine if a change needs to be made to her opioid therapy Consider switching her antidepressant to Cymbalta to help both depression and pain  Consider corticosteroid injection if she has not had one recently  Return in 2 weeks to reassess pain control       She has chronic low back pain with right-sided lower extremity radiculopathy for which she is on chronic opioid therapy. She feels her opioid therapy is not as effective as before.

## 2016-06-04 NOTE — Progress Notes (Signed)
Case discussed with Dr. Rivet soon after the resident saw the patient. We reviewed the resident's history and exam and pertinent patient test results. I agree with the assessment, diagnosis, and plan of care documented in the resident's note. 

## 2016-06-17 ENCOUNTER — Ambulatory Visit: Payer: Medicaid Other

## 2016-07-16 ENCOUNTER — Other Ambulatory Visit: Payer: Self-pay | Admitting: Internal Medicine

## 2016-07-24 ENCOUNTER — Other Ambulatory Visit: Payer: Self-pay | Admitting: Internal Medicine

## 2016-07-24 DIAGNOSIS — M5417 Radiculopathy, lumbosacral region: Secondary | ICD-10-CM

## 2016-08-04 NOTE — Progress Notes (Addendum)
CC: back pain  HPI:  [name] is a 36 y.o. 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  Back pain: Patient with history of lower back pain and radiculopathy into right leg that began after childbirth and a fall. Patient is s/p right L5-S1 laminectomy and microdisectomy in 5/16 by Dr. Sherwood Gambler. She continues to have pain; has since seen Dr. Sherwood Gambler who had no further recommendations. She has in the past received steroid injections by Dr. Alfonso Ramus but without any relief so further ones were not pursued. Patient takes norco about 4 times a day usually with some variation day to day; she has not run out early of her meds. At last visit, there was a trial of increased meloxicam from her normal with which she did not see any improvement in pain. She also complains of about a 2 month history of right knee pain that is generalized, not precipitated by any particular movement; she endorses a couple of episodes of mild swelling in it. Currently she takes meloxicam 7.5mg  daily, norco 5-325mg  q6hr PRN #120, robaxin 1000mg  q6hr PRN. She is on zoloft for depression which is well controlled - she is interested in trying cymbalta instead to also address her pain.  Lymphadenopathy, night sweats: Patient had U/S which showed a benign lymph node. She continues to have night sweats and per chart review has had them for years with no explanation. She is edentulous and denies any sores or ill fitting dentures causing discomfort in her mouth which might cause a reactive lymph node.   Acne: Patient notes improvement since last visit in her acne and has tried to stop herself from scratching at her face and back.   Please see problem based Assessment and Plan for status of patients chronic conditions.  Past Medical History:  Diagnosis Date  . Abnormal uterine bleeding 02/04/2012   Occurred 01/12/12-01/15/12. Evaluated in [facility] and all labs, exam, and pelvic u/s WNL. Given rx for ibuprofen and told to follow up as otupatient.    . Chronic back pain   . Neuromuscular disorder ([place])   . Tobacco abuse     Review of Systems:   Review of Systems  Constitutional: Negative for chills, diaphoresis, fever, malaise/fatigue and weight loss.  HENT: Negative for hearing loss and tinnitus.   Eyes: Negative for blurred vision and double vision.  Respiratory: Negative for cough, sputum production and shortness of breath.   Cardiovascular: Negative for chest pain, palpitations and leg swelling.  Gastrointestinal: Negative for abdominal pain, blood in stool, constipation, diarrhea, melena, nausea and vomiting.  Musculoskeletal: Positive for back pain. Negative for myalgias.  Skin: Negative for rash.  Neurological: Negative for dizziness, sensory change, focal weakness, weakness and headaches.  Psychiatric/Behavioral: Positive for depression (well controlled). Negative for substance abuse.    Physical Exam:  Vitals:   08/05/16 1445 08/05/16 1556  BP: (!) 160/91 (!) 181/99  Pulse: 75 62  Temp: 97.9 F (36.6 C)   TempSrc: Oral   SpO2: 100%   Weight: 97 lb 3.2 oz (44.1 kg)   Height: 5\' 2"  (1.575 m)    Physical Exam  Constitutional: She is oriented to person, place, and time. No distress.  Underweight  HENT:  Head: Normocephalic and atraumatic.  Mouth/Throat: Oropharynx is clear and moist. No oropharyngeal exudate.  Eyes: Conjunctivae and EOM are normal.  Neck: Normal range of motion.  Neck supple. No thyromegaly present.  One <1cm right anterior cervical palpable lymph node - mobile, rubbery, nontender  Cardiovascular: Normal rate, regular rhythm, normal heart sounds and intact distal pulses.  Exam reveals no gallop and no friction rub.   No murmur heard. Pulmonary/Chest: Effort normal and breath sounds normal. She has no wheezes. She has no rales.  Abdominal: Soft. Bowel  sounds are normal. She exhibits no distension. There is no tenderness.  Musculoskeletal: Normal range of motion. She exhibits no edema, tenderness or deformity.  No tenderness along spine, SI joints or surrounding musculature. ROM intact at hip, knees and ankles. Strength intact.  R knee without edema, erythema, effusion, bony or muscular tenderness.  Neurological: She is alert and oriented to person, place, and time.  Skin: Skin is warm and dry. Capillary refill takes less than 2 seconds. She is not diaphoretic. No erythema.  Healing scars from acne on face and upper back  Psychiatric: She has a normal mood and affect. Her behavior is normal. Judgment and thought content normal.    Assessment & Plan:   See Encounters Tab for problem based charting.   Patient discussed with Dr. Gerrit Friends, MD Internal Medicine PGY1

## 2016-08-05 ENCOUNTER — Ambulatory Visit (INDEPENDENT_AMBULATORY_CARE_PROVIDER_SITE_OTHER): Payer: Medicaid Other | Admitting: Internal Medicine

## 2016-08-05 ENCOUNTER — Encounter: Payer: Self-pay | Admitting: Internal Medicine

## 2016-08-05 VITALS — BP 181/99 | HR 62 | Temp 97.9°F | Ht 62.0 in | Wt 97.2 lb

## 2016-08-05 DIAGNOSIS — L709 Acne, unspecified: Secondary | ICD-10-CM | POA: Diagnosis not present

## 2016-08-05 DIAGNOSIS — Z681 Body mass index (BMI) 19 or less, adult: Secondary | ICD-10-CM

## 2016-08-05 DIAGNOSIS — R636 Underweight: Secondary | ICD-10-CM

## 2016-08-05 DIAGNOSIS — Z79891 Long term (current) use of opiate analgesic: Secondary | ICD-10-CM

## 2016-08-05 DIAGNOSIS — F32A Depression, unspecified: Secondary | ICD-10-CM

## 2016-08-05 DIAGNOSIS — R591 Generalized enlarged lymph nodes: Secondary | ICD-10-CM

## 2016-08-05 DIAGNOSIS — G8929 Other chronic pain: Secondary | ICD-10-CM

## 2016-08-05 DIAGNOSIS — F419 Anxiety disorder, unspecified: Secondary | ICD-10-CM

## 2016-08-05 DIAGNOSIS — F418 Other specified anxiety disorders: Secondary | ICD-10-CM

## 2016-08-05 DIAGNOSIS — M5417 Radiculopathy, lumbosacral region: Secondary | ICD-10-CM

## 2016-08-05 DIAGNOSIS — M5416 Radiculopathy, lumbar region: Secondary | ICD-10-CM | POA: Diagnosis not present

## 2016-08-05 DIAGNOSIS — Z981 Arthrodesis status: Secondary | ICD-10-CM | POA: Diagnosis not present

## 2016-08-05 DIAGNOSIS — R030 Elevated blood-pressure reading, without diagnosis of hypertension: Secondary | ICD-10-CM

## 2016-08-05 DIAGNOSIS — R61 Generalized hyperhidrosis: Secondary | ICD-10-CM | POA: Diagnosis not present

## 2016-08-05 DIAGNOSIS — F329 Major depressive disorder, single episode, unspecified: Secondary | ICD-10-CM

## 2016-08-05 MED ORDER — HYDROCODONE-ACETAMINOPHEN 5-325 MG PO TABS: 1.0000 | ORAL_TABLET | Freq: Four times a day (QID) | ORAL | 0 refills | Status: DC | PRN

## 2016-08-05 MED ORDER — METHOCARBAMOL 500 MG PO TABS: 1000.0000 mg | ORAL_TABLET | Freq: Four times a day (QID) | ORAL | 2 refills | Status: DC | PRN

## 2016-08-05 MED ORDER — DULOXETINE HCL 30 MG PO CPEP: 30.0000 mg | ORAL_CAPSULE | Freq: Every day | ORAL | 2 refills | Status: DC

## 2016-08-05 NOTE — Assessment & Plan Note (Signed)
Patient has been well controlled on zoloft. She is interested in switching to cymbalta to address her chronic pain as well.  Plan: --taper zoloft to 50mg  x1 week --begin cymbalta 30mg  daily x1 week with above; then increase to 60mg  daily

## 2016-08-05 NOTE — Patient Instructions (Addendum)
For one week, take 50mg  (half tablet) of zoloft and take 30mg  of cymbalta a day. After one week, stop the zoloft and increase the cymbalta to 60mg  (2 pills) a day.   I refilled your pain medicine.   We will see you in about 3 months! We'll be happy to see you before then if you need [place]!

## 2016-08-05 NOTE — Assessment & Plan Note (Signed)
Melissa Walker is on chronic opioid therapy for chronic pain. The date of the controlled substances contract is referenced in the [place] and / or the overview. Date of pain contract was 11/14/15. As part of the treatment plan, the [place] controlled substance database is checked at least twice yearly and the database results are appropriate. I have last reviewed the results on 08/05/16.   The last UDS was on 11/13/15 and results are as expected. Patient needs at least a yearly UDS.   The patient is on hydrocodone/acetaminophen (Lorcet, Lortab, Norco, Vicodin) strength 5-325mg  q6hr PRN, #120 per 30 days. Adjunctive treatment includes NSAID's and Muscle relaxants. This regimen allows Melissa Walker to function and does not cause excessive sedation or other side effects.  "The benefits of continuing opioid therapy outweigh the risks and chronic opioids will be continued. Ongoing education about safe opioid treatment is provided  Interventions today include: UDS and Refills - 3 paper Rx printed  Started on Cymbalta

## 2016-08-05 NOTE — Assessment & Plan Note (Signed)
Improved since last visit.  Plan: --continue current management --encouraged patient to refrain from scratching

## 2016-08-05 NOTE — Assessment & Plan Note (Signed)
Patient had U/S which showed a benign lymph node. She continues to have night sweats and per chart review has had them for years with no explanation. She is edentulous and denies any sores or ill fitting dentures causing discomfort in her mouth which might cause a reactive lymph node.   She has had previous STD check and HIV screen with no positives; since then, she denies IVDU, new partners, or other risk factors.   Plan: --follow clinically for now

## 2016-08-06 ENCOUNTER — Ambulatory Visit (HOSPITAL_COMMUNITY)
Admission: EM | Admit: 2016-08-06 | Discharge: 2016-08-06 | Disposition: A | Discharge status: Home / Self Care | Payer: Medicaid Other | Attending: Internal Medicine | Admitting: Internal Medicine

## 2016-08-06 ENCOUNTER — Encounter (HOSPITAL_COMMUNITY): Payer: Self-pay | Admitting: Emergency Medicine

## 2016-08-06 ENCOUNTER — Telehealth: Payer: Self-pay | Admitting: Internal Medicine

## 2016-08-06 DIAGNOSIS — G44209 Tension-type headache, unspecified, not intractable: Secondary | ICD-10-CM

## 2016-08-06 DIAGNOSIS — R030 Elevated blood-pressure reading, without diagnosis of hypertension: Secondary | ICD-10-CM | POA: Diagnosis not present

## 2016-08-06 MED ORDER — BUTALBITAL-APAP-CAFFEINE 50-325-40 MG PO TABS: 1.0000 | ORAL_TABLET | Freq: Four times a day (QID) | ORAL | 0 refills | Status: DC | PRN

## 2016-08-06 MED ORDER — KETOROLAC TROMETHAMINE 30 MG/ML IJ SOLN
INTRAMUSCULAR | Status: AC
  Filled 2016-08-06: qty 1

## 2016-08-06 MED ORDER — KETOROLAC TROMETHAMINE 30 MG/ML IJ SOLN
30.0000 mg | Freq: Once | INTRAMUSCULAR | Status: AC
  Administered 2016-08-06: 30 mg via INTRAMUSCULAR

## 2016-08-06 NOTE — Telephone Encounter (Signed)
[name] called the clinic informing us she was having a new headache that would not go away.   I called patient back shortly afterwards. She states she started having a right sided headache that came on suddenly last night for which she took 800mg  ibuprofen. At the time she had some relief and was able to go to sleep. She woke up feeling like her normal self this morning, but had a slowly increasing right sided headache again that this time was not relieved by 800mg  ibuprofen or rest. She was able to go about her day but is worried that the headache is persistent. She describes the pain as sharp with pain behind her eye, and she had some nausea but no vomiting earlier. She denies vision or hearing changes, photophobia, dizziness, numbness, tingling, focal weakness, LOC. Headache is not worsened by position or vagal maneuvers. She has never had a headache like this before; she denies a history of migraines. At clinic visits her BPs have been elevated but she has been asymptomatic - she measured her BP at home today and last reading was 165/102.   Over the phone, she sounded calm, alert and in no acute distress.  Since our clinic was already finished for the day, I advised the patient to go to an Urgent Care - the [facility] is open until 8pm. From our conversation, it sounds like a migraine headache but I am concerned that it is new for her and would like for her to be evaluated sooner rather than later.  Alphonzo Grieve, MD IMTS - PGY1

## 2016-08-06 NOTE — ED Triage Notes (Signed)
Pt c/o HTN... Reports yest her BP 181/99 at PCPs office  Last night BP was 154/109 at home... Family hx of HTN  Smokes 1 ppd  Sx today include: HA  BP today = 146/88... Smoked 30 min prior to vitals.   A&O x4... NAD... Ambulatory

## 2016-08-06 NOTE — ED Provider Notes (Signed)
CSN: [id number]     Arrival date & time 08/06/16  1736 History   None    Chief Complaint  Patient presents with  . Hypertension   (Consider location/radiation/quality/duration/timing/severity/associated sxs/prior Treatment) Patient c/o headache and elevated bp.   The history is provided by the patient and the spouse.  Hypertension  This is a new problem. The problem occurs constantly. The problem has not changed since onset.Associated symptoms include headaches.    Past Medical History:  Diagnosis Date  . Abnormal uterine bleeding 02/04/2012   Occurred 01/12/12-01/15/12. Evaluated in [facility] and all labs, exam, and pelvic u/s WNL. Given rx for ibuprofen and told to follow up as otupatient.    . Cholelithiasis 10/26/2014  . Chronic back pain   . Neuromuscular disorder ([place])   . Tobacco abuse    Past Surgical History:  Procedure Laterality Date  . DENTAL REHABILITATION     full set dentures  . LUMBAR LAMINECTOMY/DECOMPRESSION MICRODISCECTOMY Right 07/23/2014   Procedure: Right Lumbar five-Sacral one laminotomy and microdiskectomy;  Surgeon: Jovita Gamma, MD;  Location: [facility];  Service: Neurosurgery;  Laterality: Right;  Right Lumbar five-Sacral one laminotomy and microdiskectomy  . Multiple epidural steroid injections in back     Over past 2 years  . TUBAL LIGATION Bilateral 05/11/2013   Procedure: POST PARTUM TUBAL LIGATION;  Surgeon: Osborne Oman, MD;  Location: [facility];  Service: Gynecology;  Laterality: Bilateral;   Family History  Problem Relation Age of Onset  . Cancer Father        brain tumor   . Eclampsia Maternal Grandmother   . Heart attack Maternal Grandmother   . Cancer Maternal Grandmother        lung  . Heart disease Maternal Grandmother   . Hyperlipidemia Maternal Grandmother   . Hypertension Maternal Grandmother    Social History  Substance Use Topics  . Smoking status: Current Every Day Smoker    Packs/day: 1.00    Years: 18.00    Types:  Cigarettes  . Smokeless tobacco: Never Used     Comment: sometimes less.  . Alcohol use 0.0 oz/week     Comment: occasional   OB History    Gravida Para Term Preterm AB Living   2 2 2     2    SAB TAB Ectopic Multiple Live Births           2     Review of Systems  Constitutional: Negative.   HENT: Negative.   Eyes: Negative.   Respiratory: Negative.   Cardiovascular: Negative.   Gastrointestinal: Negative.   Endocrine: Negative.   Genitourinary: Negative.   Musculoskeletal: Negative.   Allergic/Immunologic: Negative.   Neurological: Positive for headaches.  Hematological: Negative.   Psychiatric/Behavioral: Negative.     Allergies  Penicillins and Nicotine  Home Medications   Prior to Admission medications   Medication Sig Start Date End Date Taking? Authorizing Provider  DULoxetine (CYMBALTA) 30 MG capsule Take 1 capsule (30 mg total) by mouth daily. For one week then take 60mg  (2 pills) once a day. 08/05/16 08/05/17 Yes Alphonzo Grieve, MD  HYDROcodone-acetaminophen (NORCO) 5-325 MG tablet Take 1 tablet by mouth every 6 (six) hours as needed for moderate pain. 08/05/16  Yes Alphonzo Grieve, MD  meloxicam (MOBIC) 7.5 MG tablet Take 1 tablet (7.5 mg total) by mouth daily. 06/03/16  Yes Rivet, Sindy Guadel[place], MD  methocarbamol (ROBAXIN) 500 MG tablet Take 2 tablets (1,000 mg total) by mouth every 6 (six) hours as  needed. 08/05/16  Yes Alphonzo Grieve, MD  sertraline (ZOLOFT) 100 MG tablet TAKE 1 TABLET BY MOUTH DAILY 07/16/16  Yes Alphonzo Grieve, MD  Benzoyl Peroxide 2.5 % CREA Apply topically every morning. 12/03/14   Shela Leff, MD  butalbital-acetaminophen-caffeine (FIORICET, ESGIC) [phone] MG tablet Take 1-2 tablets by mouth every 6 (six) hours as needed for headache. 08/06/16 08/06/17  Lysbeth Penner, FNP  clindamycin (CLINDAGEL) 1 % gel Apply topically 2 (two) times daily. Apply in the morning 04/19/15   Rabbani, Ricarda Frame, MD  diclofenac sodium (VOLTAREN) 1 % GEL Apply 4 g  topically 4 (four) times daily. 06/03/16   Rivet, Sindy Guadeloupe, MD  loratadine (CLARITIN) 10 MG tablet Take 1 tablet (10 mg total) by mouth daily. 06/03/16   Rivet, Sindy Guadeloupe, MD  tretinoin (RETIN-A) 0.025 % cream Apply topically at bedtime. 12/03/14   Shela Leff, MD   Meds Ordered and Administered this Visit   Medications  ketorolac (TORADOL) 30 MG/ML injection 30 mg (not administered)    BP (!) 146/88 (BP Location: Right Arm)   Pulse 92   Temp 98.1 F (36.7 C) (Oral)   Resp 20   LMP 07/23/2016   SpO2 100%  No data found.   Physical Exam  Constitutional: She is oriented to person, place, and time. She appears well-developed and well-nourished.  HENT:  Head: Normocephalic and atraumatic.  Right Ear: External ear normal.  Left Ear: External ear normal.  Mouth/Throat: Oropharynx is clear and moist.  Eyes: Conjunctivae and EOM are normal. Pupils are equal, round, and reactive to light.  Neck: Normal range of motion. Neck supple.  Cardiovascular: Normal rate, regular rhythm and normal heart sounds.   Pulmonary/Chest: Effort normal and breath sounds normal.  Abdominal: Soft. Bowel sounds are normal.  Musculoskeletal: Normal range of motion.  Neurological: She is alert and oriented to person, place, and time.  Nursing note and vitals reviewed.   Urgent Care Course     Procedures (including critical care time)  Labs Review Labs Reviewed - No data to display  Imaging Review No results found.   Visual Acuity Review  Right Eye Distance:   Left Eye Distance:   Bilateral Distance:    Right Eye Near:   Left Eye Near:    Bilateral Near:         MDM   1. Elevated blood pressure, situational   2. Tension headache    toradol 30mg  IM Fioricet rx    Lysbeth Penner, FNP 08/06/16 [id number]

## 2016-08-06 NOTE — Progress Notes (Signed)
Internal Medicine Clinic Attending  Case discussed with Dr. Svalina  at the time of the visit.  We reviewed the resident's history and exam and pertinent patient test results.  I agree with the assessment, diagnosis, and plan of care documented in the resident's note.  

## 2016-08-06 NOTE — Telephone Encounter (Signed)
-----   Message from [facility], [place] sent at 08/06/2016  3:53 PM EDT ----- Regarding: blood pressure readings Contact: [phone] Headache that wont go away- 154/109 08-05-2016 6-7-144/89 last reading 165/102. [address], [place] C6/7/20183:57 PM

## 2016-08-07 ENCOUNTER — Telehealth: Payer: Self-pay | Admitting: *Deleted

## 2016-08-07 NOTE — Telephone Encounter (Signed)
Thank you Bonnita Nasuti.. I agree with the plan

## 2016-08-07 NOTE — Telephone Encounter (Signed)
Pt called and stated her BP's at home on her machine are extremely high, 147/119, 122/116 etc. Went to urg care Thursday BP was 146/88 She denies chest pain, shortness of breath, n&v, weakness, vision changes, speech changes, states she did have a h/a yesterday but was given something in urg care and felt better. appt mon 6/11, advised to go to urg care or ED for any above

## 2016-08-08 LAB — TOXASSURE SELECT,+ANTIDEPR,UR

## 2016-08-10 ENCOUNTER — Encounter: Payer: Self-pay | Admitting: Internal Medicine

## 2016-08-10 ENCOUNTER — Ambulatory Visit (INDEPENDENT_AMBULATORY_CARE_PROVIDER_SITE_OTHER): Payer: Medicaid Other | Admitting: Internal Medicine

## 2016-08-10 DIAGNOSIS — I10 Essential (primary) hypertension: Secondary | ICD-10-CM | POA: Diagnosis present

## 2016-08-10 DIAGNOSIS — Z681 Body mass index (BMI) 19 or less, adult: Secondary | ICD-10-CM

## 2016-08-10 DIAGNOSIS — F17210 Nicotine dependence, cigarettes, uncomplicated: Secondary | ICD-10-CM

## 2016-08-10 DIAGNOSIS — R636 Underweight: Secondary | ICD-10-CM

## 2016-08-10 NOTE — Progress Notes (Signed)
   CC: Patient is here for an urgent care follow-up of a high blood pressure reading.  HPI:  [name] is a 36 y.o. female with a past medical history of conditions listed below presenting to the clinic for an urgent care follow-up of a high blood pressure reading. Please see problem based charting for the status of the patient's current and chronic medical conditions.   Past Medical History:  Diagnosis Date  . Abnormal uterine bleeding 02/04/2012   Occurred 01/12/12-01/15/12. Evaluated in [facility] and all labs, exam, and pelvic u/s WNL. Given rx for ibuprofen and told to follow up as otupatient.    . Cholelithiasis 10/26/2014  . Chronic back pain   . Neuromuscular disorder ([place])   . Tobacco abuse     Review of Systems: Pertinent positives mentioned in HPI. Remainder of all ROS negative.   Physical Exam:  Vitals:   08/10/16 0913 08/10/16 0954 08/10/16 0955  BP: 137/87 (!) 146/91 (!) 148/98  Pulse: 98 88 89  Temp: 97.8 F (36.6 C)    TempSrc: Oral    SpO2: 97%    Weight: 93 lb 6.4 oz (42.4 kg)    Height: 5\' 2"  (1.575 m)     Physical Exam  Constitutional: She is oriented to person, place, and time. No distress.  Underweight  Eyes: Right eye exhibits no discharge. Left eye exhibits no discharge.  Cardiovascular: Normal rate, regular rhythm and intact distal pulses.   Pulmonary/Chest: Effort normal and breath sounds normal. No respiratory distress. She has no wheezes. She has no rales.  Abdominal: Soft. Bowel sounds are normal. She exhibits no distension. There is no tenderness.  No abdominal bruit appreciated on auscultation.  Musculoskeletal: She exhibits no edema.  Neurological: She is alert and oriented to person, place, and time.  Skin: Skin is warm and dry.    Assessment & Plan:   See Encounters Tab for problem based charting.  Patient discussed with Dr. Lynnae January

## 2016-08-10 NOTE — Assessment & Plan Note (Addendum)
History of present illness Patient is here for an urgent care follow-up for a high blood pressure reading. She visited urgent care on 08/06/2016 with a complaint of a headache. Blood pressure was 146/88 at that visit. She was discharged with a prescription for Fioricet. At present, patient reports feeling well and is not having any headaches. States she has significantly cut down on her daily caffeine intake but continues to drink one glass of [facility] in a day. She does not add any additional table salt to her food but does eat out a lot. Drinks malt beverages occasionally. Does not endorse any symptoms of obstructive sleep apnea. Does not endorse any symptoms of hypothyroidism such as skin/ hair changes or heart palpitations. Reports having an extensive family history of hypertension (maternal and paternal sides of the family).  Assessment Hypertension. Blood pressure 137/87 at today's visit. However, it was elevated during recent ED visit at 146/88 and during her recent visit with her PCP on 08/05/2016 when initial reading was 160/91 and repeat 181/99. Review of chart reveals she has been intermittently hypertensive over the past year. Her home blood pressure machine was checked during this visit and seems to be working appropriately at the reading was very close to clinic reading. She is not currently on any medications such as OCPs or steroids that can cause her to be hypertensive. Fioricet was just recently prescribed at her urgent care visit; blood pressure has been intermittently elevated over the past year. She does take Mobic 7.5 mg daily for back pain but renal function was normal (creatinine 0.8, GFR 95) during prior labs done in January 2018. Patient does take Cymbalta and Zoloft which have been implicated in causing hypertension, although to a lesser degree. She does not have any symptoms of hyperthyroidism and TSH checked in December 2013 was normal. Obesity unlikely to be a cause for  hypertension in her case as she is underweight (BMI 17.8). Although she did not have hyperkalemia or metabolic alkalosis on her prior labs in January 2018, primary hyperaldosteronism is still on the differential. Other risk factors for hypertension include a strong family history. Renovascular hypertension is less likely as no abdominal bruit appreciated on exam. She does not have a history of other signs/symptoms consistent with pheochromocytoma, Cushing syndrome, or coarctation of aorta.  Plan -Repeat BMP to check renal function, potassium level, and rule out any metabolic derangements -Check renin: aldosterone ratio -DASH diet and referral to [facility] for further education -Advised her to refrain from consuming caffeinated beverages. Explained to her that Fioricet contains caffeine, as such, take the medication only as needed for headaches.  -Encouraged her to continue keeping a log of her home blood pressure readings. -Return to the clinic in 2 weeks for a blood pressure recheck. If her blood pressure is elevated at the follow-up visit, consider starting her on hydrochlorothiazide.  Addendum 08/11/2016 at 1:01 pm: BMP showing Cr 0.7, GFR 105, and normal K. Discussed results with the patient over the phone. Encouraged her to continue keeping a log of her BP readings.   Addendum 08/14/2016 at 5:32 PM: Primary hyperaldosteronism less likely as renin: aldosterone ratio normal. Tried calling the patient but could not reach her over the phone. Left a voicemail asking her to call the clinic back.

## 2016-08-10 NOTE — Patient Instructions (Addendum)
Melissa Walker it was nice meeting you today.  You are doing a great job keeping a log of your blood pressure readings at home. I would encourage you to continue doing that.   Please avoid caffeinated beverages. Take Fioricet only as needed for headaches because it does have caffeine in it.   Follow the DASH eating plan.   Return for a follow-up visit in 2 weeks.       DASH Eating Plan DASH stands for "Dietary Approaches to Stop Hypertension." The DASH eating plan is a healthy eating plan that has been shown to reduce high blood pressure (hypertension). It may also reduce your risk for type 2 diabetes, heart disease, and stroke. The DASH eating plan may also help with weight loss. What are tips for following this plan? General guidelines  Avoid eating more than 2,300 mg (milligrams) of salt (sodium) a day. If you have hypertension, you may need to reduce your sodium intake to 1,500 mg a day.  Limit alcohol intake to no more than 1 drink a day for nonpregnant women and 2 drinks a day for men. One drink equals 12 oz of beer, 5 oz of wine, or 1 oz of hard liquor.  Work with your health care provider to maintain a healthy body weight or to lose weight. Ask what an ideal weight is for you.  Get at least 30 minutes of exercise that causes your heart to beat faster (aerobic exercise) most days of the week. Activities may include walking, swimming, or biking.  Work with your health care provider or diet and nutrition specialist (dietitian) to adjust your eating plan to your individual calorie needs. Reading food labels  Check food labels for the amount of sodium per serving. Choose foods with less than 5 percent of the Daily Value of sodium. Generally, foods with less than 300 mg of sodium per serving fit into this eating plan.  To find whole grains, look for the word "whole" as the first word in the ingredient list. Shopping  Buy products labeled as "low-sodium" or "no salt added."  Buy  fresh foods. Avoid canned foods and premade or frozen meals. Cooking  Avoid adding salt when cooking. Use salt-free seasonings or herbs instead of table salt or sea salt. Check with your health care provider or pharmacist before using salt substitutes.  Do not fry foods. Cook foods using healthy methods such as baking, boiling, grilling, and broiling instead.  Cook with heart-healthy oils, such as olive, canola, soybean, or sunflower oil. Meal planning   Eat a balanced diet that includes: ? 5 or more servings of fruits and vegetables each day. At each meal, try to fill half of your plate with fruits and vegetables. ? Up to 6-8 servings of whole grains each day. ? Less than 6 oz of lean meat, poultry, or fish each day. A 3-oz serving of meat is about the same size as a deck of cards. One egg equals 1 oz. ? 2 servings of low-fat dairy each day. ? A serving of nuts, seeds, or beans 5 times each week. ? Heart-healthy fats. Healthy fats called Omega-3 fatty acids are found in foods such as flaxseeds and coldwater fish, like sardines, salmon, and mackerel.  Limit how much you eat of the following: ? Canned or prepackaged foods. ? Food that is high in trans fat, such as fried foods. ? Food that is high in saturated fat, such as fatty meat. ? Sweets, desserts, sugary drinks, and other foods  with added sugar. ? Full-fat dairy products.  Do not salt foods before eating.  Try to eat at least 2 vegetarian meals each week.  Eat more home-cooked food and less restaurant, buffet, and fast food.  When eating at a restaurant, ask that your food be prepared with less salt or no salt, if possible. What foods are recommended? The items listed may not be a complete list. Talk with your dietitian about what dietary choices are best for you. Grains Whole-grain or whole-wheat bread. Whole-grain or whole-wheat pasta. Brown rice. Melissa Walker. Bulgur. Whole-grain and low-sodium cereals. Pita bread.  Low-fat, low-sodium crackers. Whole-wheat flour tortillas. Vegetables Fresh or frozen vegetables (raw, steamed, roasted, or grilled). Low-sodium or reduced-sodium tomato and vegetable juice. Low-sodium or reduced-sodium tomato sauce and tomato paste. Low-sodium or reduced-sodium canned vegetables. Fruits All fresh, dried, or frozen fruit. Canned fruit in natural juice (without added sugar). Meat and other protein foods Skinless chicken or [place]. Ground chicken or [place]. Pork with fat trimmed off. Fish and seafood. Egg whites. Dried beans, peas, or lentils. Unsalted nuts, nut butters, and seeds. Unsalted canned beans. Lean cuts of beef with fat trimmed off. Low-sodium, lean deli meat. Dairy Low-fat (1%) or fat-free (skim) milk. Fat-free, low-fat, or reduced-fat cheeses. Nonfat, low-sodium ricotta or cottage cheese. Low-fat or nonfat yogurt. Low-fat, low-sodium cheese. Fats and oils Soft margarine without trans fats. Vegetable oil. Low-fat, reduced-fat, or light mayonnaise and salad dressings (reduced-sodium). Canola, safflower, olive, soybean, and sunflower oils. Avocado. Seasoning and other foods Herbs. Spices. Seasoning mixes without salt. Unsalted popcorn and pretzels. Fat-free sweets. What foods are not recommended? The items listed may not be a complete list. Talk with your dietitian about what dietary choices are best for you. Grains Baked goods made with fat, such as croissants, muffins, or some breads. Dry pasta or rice meal packs. Vegetables Creamed or fried vegetables. Vegetables in a cheese sauce. Regular canned vegetables (not low-sodium or reduced-sodium). Regular canned tomato sauce and paste (not low-sodium or reduced-sodium). Regular tomato and vegetable juice (not low-sodium or reduced-sodium). Melissa Walker. Olives. Fruits Canned fruit in a light or heavy syrup. Fried fruit. Fruit in cream or butter sauce. Meat and other protein foods Fatty cuts of meat. Ribs. Fried meat. Melissa Walker.  Sausage. Bologna and other processed lunch meats. Salami. Fatback. Hotdogs. Bratwurst. Salted nuts and seeds. Canned beans with added salt. Canned or smoked fish. Whole eggs or egg yolks. Chicken or [place] with skin. Dairy Whole or 2% milk, cream, and half-and-half. Whole or full-fat cream cheese. Whole-fat or sweetened yogurt. Full-fat cheese. Nondairy creamers. Whipped toppings. Processed cheese and cheese spreads. Fats and oils Butter. Stick margarine. Lard. Shortening. Ghee. Bacon fat. Tropical oils, such as coconut, palm kernel, or palm oil. Seasoning and other foods Salted popcorn and pretzels. Onion salt, garlic salt, seasoned salt, table salt, and sea salt. Worcestershire sauce. Tartar sauce. Barbecue sauce. Teriyaki sauce. Soy sauce, including reduced-sodium. Steak sauce. Canned and packaged gravies. Fish sauce. Oyster sauce. Cocktail sauce. Horseradish that you find on the shelf. Ketchup. Mustard. Meat flavorings and tenderizers. Bouillon cubes. Hot sauce and Tabasco sauce. Premade or packaged marinades. Premade or packaged taco seasonings. Relishes. Regular salad dressings. Where to find more information:  National Heart, Lung, and [place]: https://wilson-eaton.com/  American Heart Association: www.heart.org Summary  The DASH eating plan is a healthy eating plan that has been shown to reduce high blood pressure (hypertension). It may also reduce your risk for type 2 diabetes, heart disease, and stroke.  With the  DASH eating plan, you should limit salt (sodium) intake to 2,300 mg a day. If you have hypertension, you may need to reduce your sodium intake to 1,500 mg a day.  When on the DASH eating plan, aim to eat more fresh fruits and vegetables, whole grains, lean proteins, low-fat dairy, and heart-healthy fats.  Work with your health care provider or diet and nutrition specialist (dietitian) to adjust your eating plan to your individual calorie needs. This information is not intended  to replace advice given to you by your health care provider. Make sure you discuss any questions you have with your health care provider. Document Released: 02/05/2011 Document Revised: 02/10/2016 Document Reviewed: 02/10/2016 Elsevier Interactive Patient Education  2017 [employer].

## 2016-08-11 LAB — BMP8+ANION GAP
ANION GAP: 15 mmol/L (ref 10.0–18.0)
BUN/Creatinine Ratio: 8 — ABNORMAL LOW (ref 9–23)
BUN: 6 mg/dL (ref 6–20)
CALCIUM: 9.7 mg/dL (ref 8.7–10.2)
CO2: 23 mmol/L (ref 20–29)
CREATININE: 0.74 mg/dL (ref 0.57–1.00)
Chloride: 99 mmol/L (ref 96–106)
GFR calc Af Amer: 121 mL/min/{1.73_m2} (ref >59)
GFR calc non Af Amer: 105 mL/min/{1.73_m2} (ref >59)
Glucose: 86 mg/dL (ref 65–99)
POTASSIUM: 4.2 mmol/L (ref 3.5–5.2)
Sodium: 137 mmol/L (ref 134–144)

## 2016-08-11 NOTE — Progress Notes (Signed)
Internal Medicine Clinic Attending  Case discussed with Dr. Rathoreat the time of the visit. We reviewed the resident's history and exam and pertinent patient test results. I agree with the assessment, diagnosis, and plan of care documented in the resident's note.  

## 2016-08-12 ENCOUNTER — Encounter: Payer: Medicaid Other | Admitting: Internal Medicine

## 2016-08-13 ENCOUNTER — Encounter: Payer: Self-pay | Admitting: Internal Medicine

## 2016-08-14 ENCOUNTER — Other Ambulatory Visit: Payer: Self-pay

## 2016-08-14 ENCOUNTER — Telehealth: Payer: Self-pay | Admitting: Internal Medicine

## 2016-08-14 LAB — ALDOSTERONE + RENIN ACTIVITY W/ RATIO
ALDOSTERONE: <1 ng/dL (ref 0.0–30.0)
RENIN: 1.342 ng/mL/h (ref 0.167–5.380)

## 2016-08-14 NOTE — Telephone Encounter (Signed)
-----   Message from [facility], [place] sent at 08/13/2016  4:44 PM EDT ----- Regarding: Headache medication New readings 169/150, repeated after 10 minutes 171/101.Patient is also requesting headache medicine that was given to her in [facility] [place], [place] C6/14/20184:48 PM

## 2016-08-14 NOTE — Telephone Encounter (Signed)
Call to patient made her aware that Dr. Jari Favre said she was seen in clinic earlier this week and told to keep BP log and f/u in two weeks which is reasonable if she has used up all of her fioricet already in one week for recurrent headaches, she recommends that patient be seen in the clinic, it's ok to keep just checking BP once a day, if all out of fioricet and still having headaches she can try over the counter ibuprofen or tylenol (total dose of tylenol/acetaminophen should be less than 4000mg  between her norco and any extra tylenol she takes). If headaches still not relieved, she recommends  she comes in to be re-evaluated. Patient verbalized understanding of how to take OTC meds with norco denies further question to call to schedule appointment if headaches continue

## 2016-08-14 NOTE — Telephone Encounter (Addendum)
Patient called to let us know her BP is still high and asking for Fioricet refill which she initially received at [facility] one week ago.  She was seen in clinic earlier this week and told to keep BP log and f/u in two weeks which is reasonable.  If she has used up all of her fioricet already in one week for recurrent headaches, I would recommend she be seen in the clinic.  Plan: --It's ok to keep just checking BP once a day --If all out of fioricet and still having headaches she can try over the counter ibuprofen or tylenol (total dose of tylenol/acetaminophen should be less than 4000mg  between her norco and any extra tylenol she takes). If headaches still no relieved, I would recommend she comes in to be re-evaluated.  Alphonzo Grieve, MD [facility] - PGY1 Pager [phone]

## 2016-08-19 ENCOUNTER — Encounter (HOSPITAL_BASED_OUTPATIENT_CLINIC_OR_DEPARTMENT_OTHER): Payer: Self-pay

## 2016-08-19 ENCOUNTER — Emergency Department (HOSPITAL_BASED_OUTPATIENT_CLINIC_OR_DEPARTMENT_OTHER)
Admission: EM | Admit: 2016-08-19 | Discharge: 2016-08-19 | Disposition: A | Discharge status: Home / Self Care | Payer: Medicaid Other | Attending: Emergency Medicine | Admitting: Emergency Medicine

## 2016-08-19 DIAGNOSIS — F17210 Nicotine dependence, cigarettes, uncomplicated: Secondary | ICD-10-CM | POA: Diagnosis not present

## 2016-08-19 DIAGNOSIS — M79671 Pain in right foot: Secondary | ICD-10-CM | POA: Diagnosis not present

## 2016-08-19 DIAGNOSIS — I10 Essential (primary) hypertension: Secondary | ICD-10-CM | POA: Insufficient documentation

## 2016-08-19 DIAGNOSIS — Z79899 Other long term (current) drug therapy: Secondary | ICD-10-CM | POA: Insufficient documentation

## 2016-08-19 MED ORDER — KETOROLAC TROMETHAMINE 30 MG/ML IJ SOLN
15.0000 mg | Freq: Once | INTRAMUSCULAR | Status: AC
  Administered 2016-08-19: 15 mg via INTRAMUSCULAR
  Filled 2016-08-19: qty 1

## 2016-08-19 NOTE — Discharge Instructions (Signed)
Rest, Ice intermittently (in the first 24-48 hours), Gentle compression with an Ace wrap, and elevate (Limb above the level of the heart)   Take up to 800mg of ibuprofen (that is usually 4 over the counter pills)  3 times a day for 5 days. Take with food.  

## 2016-08-19 NOTE — ED Notes (Signed)
Pt verbalized understanding of discharge instructions and denies any further questions at this time.    Pt fitted for crutches

## 2016-08-19 NOTE — ED Triage Notes (Signed)
C/o pain to top of right foot x 3 days-denies injury-NAD-steady gait

## 2016-08-19 NOTE — ED Provider Notes (Signed)
[facility] DEPT [facility] Provider Note   CSN: [id number] Arrival date & time: 08/19/16  1309     History   Chief Complaint Chief Complaint  Patient presents with  . Foot Pain    HPI   Blood pressure (!) 142/84, pulse (!) 104, temperature 98.1 F (36.7 C), temperature source Oral, resp. rate 18, height 5\' 2"  (1.575 m), weight 43.1 kg (95 lb), last menstrual period 07/23/2016, SpO2 95 %.  Melissa Walker is a 36 y.o. female complaining of atraumatic pain to the dorsum of the right foot which she noticed several days ago with associated swelling. There was no trauma. She's been taking Norco for her chronic back pain but she states that is not helpful. She's also been taking ibuprofen which she says she takes for migraines but states that isn't helping either.  Past Medical History:  Diagnosis Date  . Abnormal uterine bleeding 02/04/2012   Occurred 01/12/12-01/15/12. Evaluated in [facility] and all labs, exam, and pelvic u/s WNL. Given rx for ibuprofen and told to follow up as otupatient.    . Cholelithiasis 10/26/2014  . Chronic back pain   . Neuromuscular disorder ([place])   . Tobacco abuse     Patient Active Problem List   Diagnosis Date Noted  . Hypertension 08/10/2016  . Elevated blood pressure reading 08/05/2016  . Night sweats 03/30/2016  . Thyroid function study abnormality 03/30/2016  . Long-term current use of opiate analgesic 02/27/2016  . Adult acne 12/04/2014  . Anxiety and depression 10/26/2014  . Healthcare maintenance 10/26/2014  . Underweight 10/26/2014  . HNP (herniated nucleus pulposus), lumbar 07/23/2014  . Lumbosacral radiculopathy 04/13/2008  . TOBACCO ABUSE 02/02/2006    Past Surgical History:  Procedure Laterality Date  . DENTAL REHABILITATION     full set dentures  . LUMBAR LAMINECTOMY/DECOMPRESSION MICRODISCECTOMY Right 07/23/2014   Procedure: Right Lumbar five-Sacral one laminotomy and microdiskectomy;  Surgeon: Jovita Gamma, MD;  Location: [facility];  Service: Neurosurgery;  Laterality: Right;  Right Lumbar five-Sacral one laminotomy and microdiskectomy  . Multiple epidural steroid injections in back     Over past 2 years  . TUBAL LIGATION Bilateral 05/11/2013   Procedure: POST PARTUM TUBAL LIGATION;  Surgeon: Osborne Oman, MD;  Location: [facility];  Service: Gynecology;  Laterality: Bilateral;    OB History    Gravida Para Term Preterm AB Living   2 2 2     2    SAB TAB Ectopic Multiple Live Births           2       Home Medications    Prior to Admission medications   Medication Sig Start Date End Date Taking? Authorizing Provider  Benzoyl Peroxide 2.5 % CREA Apply topically every morning. 12/03/14   Shela Leff, MD  butalbital-acetaminophen-caffeine (FIORICET, ESGIC) [phone] MG tablet Take 1-2 tablets by mouth every 6 (six) hours as needed for headache. 08/06/16 08/06/17  Lysbeth Penner, FNP  clindamycin (CLINDAGEL) 1 % gel Apply topically 2 (two) times daily. Apply in the morning 04/19/15   Rabbani, Ricarda Frame, MD  diclofenac sodium (VOLTAREN) 1 % GEL Apply 4 g topically 4 (four) times daily. 06/03/16   Rivet, Sindy Guadeloupe, MD  DULoxetine (CYMBALTA) 30 MG capsule Take 1 capsule (30 mg total) by mouth daily. For one week then take 60mg  (2 pills) once a day. 08/05/16 08/05/17  Alphonzo Grieve, MD  HYDROcodone-acetaminophen (NORCO) 5-325 MG tablet Take 1 tablet by mouth every 6 (six) hours as needed  for moderate pain. 08/05/16   Alphonzo Grieve, MD  loratadine (CLARITIN) 10 MG tablet Take 1 tablet (10 mg total) by mouth daily. 06/03/16   Rivet, Sindy Guadeloupe, MD  meloxicam (MOBIC) 7.5 MG tablet Take 1 tablet (7.5 mg total) by mouth daily. 06/03/16   Rivet, Sindy Guadeloupe, MD  methocarbamol (ROBAXIN) 500 MG tablet Take 2 tablets (1,000 mg total) by mouth every 6 (six) hours as needed. 08/05/16   Alphonzo Grieve, MD  sertraline (ZOLOFT) 100 MG tablet TAKE 1 TABLET BY MOUTH DAILY 07/16/16   Alphonzo Grieve, MD  tretinoin (RETIN-A) 0.025 % cream Apply  topically at bedtime. 12/03/14   Shela Leff, MD    Family History Family History  Problem Relation Age of Onset  . Cancer Father        brain tumor   . Eclampsia Maternal Grandmother   . Heart attack Maternal Grandmother   . Cancer Maternal Grandmother        lung  . Heart disease Maternal Grandmother   . Hyperlipidemia Maternal Grandmother   . Hypertension Maternal Grandmother     Social History Social History  Substance Use Topics  . Smoking status: Current Every Day Smoker    Packs/day: 1.00    Years: 18.00    Types: Cigarettes  . Smokeless tobacco: Never Used     Comment: sometimes less. Cutting back  . Alcohol use 0.0 oz/week     Comment: occasional     Allergies   Penicillins and Nicotine   Review of Systems Review of Systems  A complete review of systems was obtained and all systems are negative except as noted in the HPI and PMH.   Physical Exam Updated Vital Signs BP (!) 142/84 (BP Location: Right Arm)   Pulse (!) 104   Temp 98.1 F (36.7 C) (Oral)   Resp 18   Ht 5\' 2"  (1.575 m)   Wt 43.1 kg (95 lb)   LMP 07/23/2016   SpO2 95%   BMI 17.38 kg/m   Physical Exam  Constitutional: She is oriented to person, place, and time. She appears well-developed and well-nourished. No distress.  HENT:  Head: Normocephalic and atraumatic.  Mouth/Throat: Oropharynx is clear and moist.  Eyes: Conjunctivae and EOM are normal. Pupils are equal, round, and reactive to light.  Neck: Normal range of motion.  Cardiovascular: Normal rate, regular rhythm and intact distal pulses.   Pulmonary/Chest: Effort normal and breath sounds normal.  Abdominal: Soft. There is no tenderness.  Musculoskeletal: Normal range of motion. She exhibits tenderness.  Trace edema to dorsum of right foot. No warmth, induration or rash. DP and PT pulses are 2+, excellent range of motion to toes.  Neurological: She is alert and oriented to person, place, and time.  Skin: She is not  diaphoretic.  Psychiatric: She has a normal mood and affect.  Nursing note and vitals reviewed.    ED Treatments / Results  Labs (all labs ordered are listed, but only abnormal results are displayed) Labs Reviewed - No data to display  EKG  EKG Interpretation None       Radiology No results found.  Procedures Procedures (including critical care time)  Medications Ordered in ED Medications  ketorolac (TORADOL) 30 MG/ML injection 15 mg (not administered)     Initial Impression / Assessment and Plan / ED Course  I have reviewed the triage vital signs and the nursing notes.  Pertinent labs & imaging results that were available during my care of the patient  were reviewed by me and considered in my medical decision making (see chart for details).     Vitals:   08/19/16 1313  BP: (!) 142/84  Pulse: (!) 104  Resp: 18  Temp: 98.1 F (36.7 C)  TempSrc: Oral  SpO2: 95%  Weight: 43.1 kg (95 lb)  Height: 5\' 2"  (1.575 m)    Medications  ketorolac (TORADOL) 30 MG/ML injection 15 mg (not administered)    Melissa Walker is 36 y.o. female presenting with Atraumatic pain to right foot. Trace swelling to dorsum. There is no abnormality in the calf, I doubt this is a DVT. Patient will be given Ace wrap, crutches and a shot of Toradol in the ED. Encouraged her to follow closely with primary care sports medicine referral is given PRN  Evaluation does not show pathology that would require ongoing emergent intervention or inpatient treatment. Pt is hemodynamically stable and mentating appropriately. Discussed findings and plan with patient/guardian, who agrees with care plan. All questions answered. Return precautions discussed and outpatient follow up given.      Final Clinical Impressions(s) / ED Diagnoses   Final diagnoses:  Foot pain, right    New Prescriptions New Prescriptions   No medications on file     Waynetta Pean 08/19/16 1344    Veryl Speak, MD 08/20/16 [phone]

## 2016-08-26 ENCOUNTER — Other Ambulatory Visit: Payer: Self-pay | Admitting: *Deleted

## 2016-08-26 DIAGNOSIS — M5417 Radiculopathy, lumbosacral region: Secondary | ICD-10-CM

## 2016-08-31 ENCOUNTER — Telehealth: Payer: Self-pay | Admitting: *Deleted

## 2016-08-31 NOTE — Telephone Encounter (Signed)
Pt had called about Robaxin refill - called pt , no answer. Left message med was refilled on 08/05/16 and sent to [facility]. And call back for any questions

## 2016-09-01 ENCOUNTER — Other Ambulatory Visit: Payer: Self-pay | Admitting: Internal Medicine

## 2016-09-01 DIAGNOSIS — M5417 Radiculopathy, lumbosacral region: Secondary | ICD-10-CM

## 2016-09-01 NOTE — Telephone Encounter (Signed)
Pt had called/stated Robaxin is on back order at [facility]. I called [facility] this am - stated it is on back order but pt could try calling one of the other pharmacies ie [facility] and have rx transferred. I called pt - stated she will do this and if she has any problems, she will call me back.

## 2016-09-07 ENCOUNTER — Ambulatory Visit: Payer: Medicaid Other | Admitting: Dietician

## 2016-09-07 NOTE — Patient Instructions (Addendum)
You are doing a great job getting fruits and vegetable son the table for your family!  Your blood pressure should be less than 140/90 better to be 130/80 or 120/70.    The changes to help my blood pressure are:  1- eat healthier- eat 4 foods groups at each meal- fruit vegetables, protein and grain  2- Try to make 5 dinner from food at home and get food from fast food two times a week  3- cut down on smoking and soda  A, could drink something other than Mt. Dew at bedtime- chocolate milf,                 orange juice

## 2016-09-07 NOTE — Progress Notes (Signed)
  Medical Nutrition Therapy:  Appt start time: 1009 end time:  1100. Visit # 1  Assessment:  Primary concerns today: Ms. Roesler is happy with her weight, she is concerned about her blood pressure. She says she has always been very small and has weighed about 95# except when she was pregnant. She has been checking her blood pressure at home and many of the readings appear to be higher than 140/90. She gets plenty of activity, has cut down from 2 pack a day smoker to a half a pack a day. Her diet described is deficient in nutrients due to excess sugar in soda and fast food intake. 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 She eats fast food for dinner every night so her family can each get what they want to eat. She has not applied for food stamps.  Preferred Learning Style:  No preference indicated  Learning Readiness: Contemplating,  Ready, change in progress  ANTHROPOMETRICS: weight 95.5#, height-51.5#, BMI-17.75, IBW- 107 #(96-118#) WEIGHT HISTORY: Highest: 120# with pregnancy, other wise 90-100# all her life.  SLEEP:10-6 but broken by having to use th bathroom and sleeping with her 37 year old MEDICATIONS: noted DIETARY INTAKE: Usual eating pattern includes 3 meals and 1-2 snacks per day.Dining Out (times/week): 7 meals a week. Everyday foods include Mt Dew, sandwiches, fruit,   Avoided foods include: pork, salty foods   24-hr recall:  B ( AM): 16 oz Mt. Dew and dry cereal at times- mini wheat or cocoa puffs  L ( PM): [place] sandwich on wheat with olives, mayo and vinegar D ( PM): mostly fast food- [employer]. Brendolyn Patty, subway-6-12" [place] sib with vinegar olives and mayonaisse Snk ( PM): 16 oz Mt. Dew Beverages: water and Mt. Dew  Usual physical activity: walks a lot and is constantly moving.   Progress Towards Goal(s):  In progress.   Nutritional Diagnosis:  NB-1.1 Food and nutrition-related knowledge deficit As related to lack of prior education and competing  values.  As evidenced by her report and lack of resources. .    Intervention:  Nutrition education about well balanced meals, how to plan for food shopping for a family and meal ideas that are easy and her children may like Coordination of care: none at this time  Teaching Method Utilized: Visual, Auditory, Hands on Handouts given during visit include:shopping list, AVS Barriers to learning/adherence to lifestyle change: competing values Demonstrated degree of understanding via:  Teach Back   Monitoring/Evaluation:  Dietary intake, exercise, and body weight in 4 week(s). Carley Strickling, Butch Penny, [place] 09/07/2016 4:53 PM.

## 2016-09-08 ENCOUNTER — Encounter (HOSPITAL_COMMUNITY): Payer: Self-pay | Admitting: Emergency Medicine

## 2016-09-08 ENCOUNTER — Ambulatory Visit (HOSPITAL_COMMUNITY)
Admission: EM | Admit: 2016-09-08 | Discharge: 2016-09-08 | Disposition: A | Discharge status: Home / Self Care | Payer: Medicaid Other | Attending: Family Medicine | Admitting: Family Medicine

## 2016-09-08 ENCOUNTER — Ambulatory Visit (HOSPITAL_COMMUNITY): Payer: Medicaid Other

## 2016-09-08 ENCOUNTER — Ambulatory Visit (INDEPENDENT_AMBULATORY_CARE_PROVIDER_SITE_OTHER): Payer: Medicaid Other

## 2016-09-08 DIAGNOSIS — S92334A Nondisplaced fracture of third metatarsal bone, right foot, initial encounter for closed fracture: Secondary | ICD-10-CM

## 2016-09-08 MED ORDER — IBUPROFEN 600 MG PO TABS: 600.0000 mg | ORAL_TABLET | Freq: Four times a day (QID) | ORAL | 0 refills | Status: DC | PRN

## 2016-09-08 NOTE — Discharge Instructions (Signed)
Schedule to see Dr. Doran Durand for recheck in 1 week.

## 2016-09-08 NOTE — ED Triage Notes (Addendum)
The patient presented to the [facility] with a complaint of right foot pain x 3 weeks. The patient denied any known injury. The patient reported being evaluated and treated at [facility] ED for the same complaint.

## 2016-09-08 NOTE — ED Provider Notes (Signed)
CSN: [id number]     Arrival date & time 09/08/16  1120 History   None    Chief Complaint  Patient presents with  . Foot Pain   (Consider location/radiation/quality/duration/timing/severity/associated sxs/prior Treatment) The history is provided by the patient. No language interpreter was used.  Foot Pain  This is a new problem. The problem occurs constantly. The problem has not changed since onset.Nothing aggravates the symptoms. Nothing relieves the symptoms. She has tried nothing for the symptoms. The treatment provided no relief.  Pt complains of pain in her right foot.  Pain is worse with walking.  Pt noticed swelling 3 weeks ago.  Past Medical History:  Diagnosis Date  . Abnormal uterine bleeding 02/04/2012   Occurred 01/12/12-01/15/12. Evaluated in [facility] and all labs, exam, and pelvic u/s WNL. Given rx for ibuprofen and told to follow up as otupatient.    . Cholelithiasis 10/26/2014  . Chronic back pain   . Neuromuscular disorder ([place])   . Tobacco abuse    Past Surgical History:  Procedure Laterality Date  . DENTAL REHABILITATION     full set dentures  . LUMBAR LAMINECTOMY/DECOMPRESSION MICRODISCECTOMY Right 07/23/2014   Procedure: Right Lumbar five-Sacral one laminotomy and microdiskectomy;  Surgeon: Jovita Gamma, MD;  Location: [facility];  Service: Neurosurgery;  Laterality: Right;  Right Lumbar five-Sacral one laminotomy and microdiskectomy  . Multiple epidural steroid injections in back     Over past 2 years  . TUBAL LIGATION Bilateral 05/11/2013   Procedure: POST PARTUM TUBAL LIGATION;  Surgeon: Osborne Oman, MD;  Location: [facility];  Service: Gynecology;  Laterality: Bilateral;   Family History  Problem Relation Age of Onset  . Cancer Father        brain tumor   . Eclampsia Maternal Grandmother   . Heart attack Maternal Grandmother   . Cancer Maternal Grandmother        lung  . Heart disease Maternal Grandmother   . Hyperlipidemia Maternal Grandmother   .  Hypertension Maternal Grandmother    Social History  Substance Use Topics  . Smoking status: Current Every Day Smoker    Packs/day: 1.00    Years: 18.00    Types: Cigarettes  . Smokeless tobacco: Never Used     Comment: sometimes less. Cutting back  . Alcohol use 0.0 oz/week     Comment: occasional   OB History    Gravida Para Term Preterm AB Living   2 2 2     2    SAB TAB Ectopic Multiple Live Births           2     Review of Systems  All other systems reviewed and are negative.   Allergies  Penicillins and Nicotine  Home Medications   Prior to Admission medications   Medication Sig Start Date End Date Taking? Authorizing Provider  DULoxetine (CYMBALTA) 30 MG capsule Take 1 capsule (30 mg total) by mouth daily. For one week then take 60mg  (2 pills) once a day. 08/05/16 08/05/17 Yes Alphonzo Grieve, MD  HYDROcodone-acetaminophen (NORCO) 5-325 MG tablet Take 1 tablet by mouth every 6 (six) hours as needed for moderate pain. 08/05/16  Yes Alphonzo Grieve, MD  meloxicam (MOBIC) 7.5 MG tablet Take 1 tablet (7.5 mg total) by mouth daily. 06/03/16  Yes Rivet, Sindy Guadeloupe, MD  methocarbamol (ROBAXIN) 500 MG tablet Take 2 tablets (1,000 mg total) by mouth every 6 (six) hours as needed. 08/05/16  Yes Alphonzo Grieve, MD  ibuprofen (ADVIL,MOTRIN) 600 MG  tablet Take 1 tablet (600 mg total) by mouth every 6 (six) hours as needed. 09/08/16   Fransico Meadow, PA-C   Meds Ordered and Administered this Visit  Medications - No data to display  BP (!) 141/83 (BP Location: Right Arm)   Pulse 84   Temp 98.5 F (36.9 C) (Oral)   Resp 18   LMP 08/22/2016 (Exact Date)   SpO2 99%  No data found.   Physical Exam  Constitutional: She appears well-developed and well-nourished.  HENT:  Head: Normocephalic.  Musculoskeletal: She exhibits tenderness.  Tender right mid foot to palpation  nv and ns intact    Skin: Skin is warm.  Psychiatric: She has a normal mood and affect.    Urgent Care Course      Procedures (including critical care time)  Labs Review Labs Reviewed - No data to display  Imaging Review Dg Foot Complete Right  Result Date: 09/08/2016 CLINICAL DATA:  Right foot pain and swelling for 3 weeks without known injury. EXAM: RIGHT FOOT COMPLETE - 3+ VIEW COMPARISON:  None. FINDINGS: Minimally displaced fracture is seen involving the distal portion of the third metatarsal with callus formation suggesting subacute fracture. No other fracture or bony abnormality is noted. Joint spaces are intact. No soft tissue abnormality is noted. IMPRESSION: Healing subacute fracture seen involving the third metatarsal. Electronically Signed   By: Marijo Conception, M.D.   On: 09/08/2016 13:13     Visual Acuity Review  Right Eye Distance:   Left Eye Distance:   Bilateral Distance:    Right Eye Near:   Left Eye Near:    Bilateral Near:         MDM   1. Closed nondisplaced fracture of third metatarsal bone of right foot, initial encounter    Pt placed in a cam walker.   Pt advised to follow up with [facility] for recheck in 1 week.  An After Visit Summary was printed and given to the patient. Meds ordered this encounter  Medications  . ibuprofen (ADVIL,MOTRIN) 600 MG tablet    Sig: Take 1 tablet (600 mg total) by mouth every 6 (six) hours as needed.    Dispense:  30 tablet    Refill:  0    Order Specific Question:   Supervising Provider    Answer:   Shaune Spittle, PA-C 09/08/16 1643

## 2016-09-21 ENCOUNTER — Encounter (INDEPENDENT_AMBULATORY_CARE_PROVIDER_SITE_OTHER): Payer: Self-pay | Admitting: Orthopaedic Surgery

## 2016-09-21 ENCOUNTER — Ambulatory Visit (INDEPENDENT_AMBULATORY_CARE_PROVIDER_SITE_OTHER): Payer: Medicaid Other | Admitting: Orthopaedic Surgery

## 2016-09-21 DIAGNOSIS — M84374A Stress fracture, right foot, initial encounter for fracture: Secondary | ICD-10-CM | POA: Insufficient documentation

## 2016-09-21 NOTE — Progress Notes (Signed)
Office Visit Note   Patient: Melissa Walker           Date of Birth: June 22, 1980           MRN: [id number] Visit Date: 09/21/2016              Requested by: Alphonzo Grieve, MD [address] [place], [place] [address] PCP: Alphonzo Grieve, MD   Assessment & Plan: Visit Diagnoses:  1. Stress fracture of metatarsal bone of right foot, initial encounter     Plan: Patient has subacute third metatarsal fracture. Postop she was provided today. Vitamin D and calcium levels were drawn today. Follow-up in 4 weeks with 3 view x-rays of the right foot.  Follow-Up Instructions: Return in about 4 weeks (around 10/19/2016).   Orders:  Orders Placed This Encounter  Procedures  . Vitamin D (25 hydroxy)  . Calcium   No orders of the defined types were placed in this encounter.     Procedures: No procedures performed   Clinical Data: No additional findings.   Subjective: Chief Complaint  Patient presents with  . Right Foot - Pain, Fracture    Patient is a 36 year old female comes in with right foot pain for several weeks. She was diagnosed with a subacute stress fracture of her third metatarsal. She did not wear a cam boot because it throughout her back and she's had back issues in the past. She denies any numbness and tingling. Pain is worse with ambulation and weightbearing.    Review of Systems  Constitutional: Negative.   HENT: Negative.   Eyes: Negative.   Respiratory: Negative.   Cardiovascular: Negative.   Endocrine: Negative.   Musculoskeletal: Negative.   Neurological: Negative.   Hematological: Negative.   Psychiatric/Behavioral: Negative.   All other systems reviewed and are negative.    Objective: Vital Signs: LMP 08/22/2016 (Exact Date)   Physical Exam  Constitutional: She is oriented to person, place, and time. She appears well-developed and well-nourished.  HENT:  Head: Normocephalic and atraumatic.  Eyes: EOM are normal.  Neck: Neck supple.    Pulmonary/Chest: Effort normal.  Abdominal: Soft.  Neurological: She is alert and oriented to person, place, and time.  Skin: Skin is warm. Capillary refill takes less than 2 seconds.  Psychiatric: She has a normal mood and affect. Her behavior is normal. Judgment and thought content normal.  Nursing note and vitals reviewed.   Ortho Exam Right foot exam shows mild swelling and tenderness over the metatarsal fracture. Otherwise the exam is benign. Specialty Comments:  No specialty comments available.  Imaging: No results found.   PMFS History: Patient Active Problem List   Diagnosis Date Noted  . Metatarsal stress fracture of right foot 09/21/2016  . Hypertension 08/10/2016  . Elevated blood pressure reading 08/05/2016  . Night sweats 03/30/2016  . Thyroid function study abnormality 03/30/2016  . Long-term current use of opiate analgesic 02/27/2016  . Adult acne 12/04/2014  . Anxiety and depression 10/26/2014  . Healthcare maintenance 10/26/2014  . Underweight 10/26/2014  . HNP (herniated nucleus pulposus), lumbar 07/23/2014  . Lumbosacral radiculopathy 04/13/2008  . TOBACCO ABUSE 02/02/2006   Past Medical History:  Diagnosis Date  . Abnormal uterine bleeding 02/04/2012   Occurred 01/12/12-01/15/12. Evaluated in [facility]U and all labs, exam, and pelvic u/s WNL. Given rx for ibuprofen and told to follow up as otupatient.    . Cholelithiasis 10/26/2014  . Chronic back pain   . Neuromuscular disorder ([place])   . Tobacco  abuse     Family History  Problem Relation Age of Onset  . Cancer Father        brain tumor   . Eclampsia Maternal Grandmother   . Heart attack Maternal Grandmother   . Cancer Maternal Grandmother        lung  . Heart disease Maternal Grandmother   . Hyperlipidemia Maternal Grandmother   . Hypertension Maternal Grandmother     Past Surgical History:  Procedure Laterality Date  . DENTAL REHABILITATION     full set dentures  . LUMBAR  LAMINECTOMY/DECOMPRESSION MICRODISCECTOMY Right 07/23/2014   Procedure: Right Lumbar five-Sacral one laminotomy and microdiskectomy;  Surgeon: Jovita Gamma, MD;  Location: [facility];  Service: Neurosurgery;  Laterality: Right;  Right Lumbar five-Sacral one laminotomy and microdiskectomy  . Multiple epidural steroid injections in back     Over past 2 years  . TUBAL LIGATION Bilateral 05/11/2013   Procedure: POST PARTUM TUBAL LIGATION;  Surgeon: Osborne Oman, MD;  Location: [facility];  Service: Gynecology;  Laterality: Bilateral;   Social History   Occupational History  . Not on file.   Social History Main Topics  . Smoking status: Current Every Day Smoker    Packs/day: 1.00    Years: 18.00    Types: Cigarettes  . Smokeless tobacco: Never Used     Comment: sometimes less. Cutting back  . Alcohol use 0.0 oz/week     Comment: occasional  . Drug use: No  . Sexual activity: Yes    Birth control/ protection: Condom, Surgical

## 2016-09-22 LAB — VITAMIN D 25 HYDROXY (VIT D DEFICIENCY, FRACTURES): Vit D, 25-Hydroxy: 33 ng/mL (ref 30–100)

## 2016-09-22 LAB — CALCIUM: Calcium: 9.5 mg/dL (ref 8.6–10.2)

## 2016-10-05 ENCOUNTER — Telehealth (INDEPENDENT_AMBULATORY_CARE_PROVIDER_SITE_OTHER): Payer: Self-pay | Admitting: Orthopaedic Surgery

## 2016-10-05 ENCOUNTER — Ambulatory Visit: Payer: Medicaid Other | Admitting: Dietician

## 2016-10-05 NOTE — Telephone Encounter (Signed)
Patient called wanting to know the results of her lab work. CB # [phone]

## 2016-10-05 NOTE — Telephone Encounter (Signed)
See message below °

## 2016-10-05 NOTE — Telephone Encounter (Signed)
They were normal

## 2016-10-06 NOTE — Telephone Encounter (Signed)
Called pt to let her know results. No answer LMOM

## 2016-10-13 NOTE — Progress Notes (Signed)
   CC: high blood pressure  HPI:  Melissa Walker is a 36 y.o. with a PMH of chronic low back pain and depression presenting to clinic for management of elevated blood pressures.  HTN:  Patient has had elevated BPs in office and at home which were at first associated with headaches (these have since resolved). Patient has been doing a good job of taking her BP at home and keeping a log which she brings in today - readings all show a consistently elevated SBP>140 more than 80% of readings. Patient states she otherwise feels well and denies further headaches, denies vision or hearing changes, chest pain, shortness of breath, focal weakness. She has significantly cut down on the amount of caffeine she takes in and will now only have an occasional cup of Pepsi; she had decreased her cigarette use to 0.5ppd from 1ppd. She has also decreased her hydrocodone-apap to only 2-3 pills per day.   Please see problem based Assessment and Plan for status of patients chronic conditions.  Past Medical History:  Diagnosis Date  . Abnormal uterine bleeding 02/04/2012   Occurred 01/12/12-01/15/12. Evaluated in MAU and all labs, exam, and pelvic u/s WNL. Given rx for ibuprofen and told to follow up as otupatient.    . Cholelithiasis 10/26/2014  . Chronic back pain   . Neuromuscular disorder ([place])   . Tobacco abuse     Review of Systems:   Review of Systems  Constitutional: Negative for malaise/fatigue.  HENT: Negative for hearing loss.   Eyes: Negative for blurred vision and double vision.  Respiratory: Negative for shortness of breath.   Cardiovascular: Negative for chest pain.  Musculoskeletal: Negative for falls.  Neurological: Negative for dizziness, focal weakness, weakness and headaches.    Physical Exam:  Vitals:   10/14/16 1546  BP: (!) 144/62  Pulse: (!) 104  Temp: 98 F (36.7 C)  TempSrc: Oral  Weight: 95 lb 6.4 oz (43.3 kg)   Physical Exam  Constitutional: She is oriented to  person, place, and time. She appears well-developed and well-nourished. No distress.  HENT:  Head: Normocephalic and atraumatic.  Eyes: EOM are normal.  Neck: Normal range of motion.  Cardiovascular: Normal rate, regular rhythm, normal heart sounds and intact distal pulses.   No abdominal bruit appreciated  Pulmonary/Chest: Effort normal and breath sounds normal. She has no wheezes. She has no rales.  Abdominal: Soft. Bowel sounds are normal.  Musculoskeletal: Normal range of motion. She exhibits no edema.  Neurological: She is alert and oriented to person, place, and time. No cranial nerve deficit.  Skin: Skin is warm and dry. No rash noted. She is not diaphoretic. No erythema.  Psychiatric: She has a normal mood and affect. Her behavior is normal. Judgment and thought content normal.    Assessment & Plan:   See Encounters Tab for problem based charting.   Patient discussed with Dr. Carmel [place], MD Internal Medicine PGY2

## 2016-10-14 ENCOUNTER — Encounter: Payer: Self-pay | Admitting: Internal Medicine

## 2016-10-14 ENCOUNTER — Ambulatory Visit (INDEPENDENT_AMBULATORY_CARE_PROVIDER_SITE_OTHER): Payer: Medicaid Other | Admitting: Internal Medicine

## 2016-10-14 VITALS — BP 144/62 | HR 104 | Temp 98.0°F | Wt 95.4 lb

## 2016-10-14 DIAGNOSIS — Z79891 Long term (current) use of opiate analgesic: Secondary | ICD-10-CM | POA: Diagnosis not present

## 2016-10-14 DIAGNOSIS — F419 Anxiety disorder, unspecified: Secondary | ICD-10-CM

## 2016-10-14 DIAGNOSIS — F17210 Nicotine dependence, cigarettes, uncomplicated: Secondary | ICD-10-CM

## 2016-10-14 DIAGNOSIS — Z79899 Other long term (current) drug therapy: Secondary | ICD-10-CM

## 2016-10-14 DIAGNOSIS — M545 Low back pain: Secondary | ICD-10-CM | POA: Diagnosis not present

## 2016-10-14 DIAGNOSIS — F329 Major depressive disorder, single episode, unspecified: Secondary | ICD-10-CM

## 2016-10-14 DIAGNOSIS — I10 Essential (primary) hypertension: Secondary | ICD-10-CM

## 2016-10-14 DIAGNOSIS — G8929 Other chronic pain: Secondary | ICD-10-CM

## 2016-10-14 DIAGNOSIS — F418 Other specified anxiety disorders: Secondary | ICD-10-CM

## 2016-10-14 MED ORDER — HYDROCHLOROTHIAZIDE 12.5 MG PO TABS: 12.5000 mg | ORAL_TABLET | Freq: Every day | ORAL | 2 refills | Status: DC

## 2016-10-14 MED ORDER — DULOXETINE HCL 60 MG PO CPEP: 60.0000 mg | ORAL_CAPSULE | Freq: Every day | ORAL | 2 refills | Status: DC

## 2016-10-14 NOTE — Assessment & Plan Note (Signed)
Patient has had elevated BPs in office and at home which were at first associated with headaches (these have since resolved). Patient has been doing a good job of taking her BP at home and keeping a log which she brings in today - readings all show a consistently elevated SBP>140 more than 80% of readings. Patient states she otherwise feels well and denies further headaches, denies vision or hearing changes, chest pain, shortness of breath, focal weakness. She has significantly cut down on the amount of caffeine she takes in and will now only have an occasional cup of Pepsi; she had decreased her cigarette use to 0.5ppd from 1ppd. She has also decreased her hydrocodone-apap to only 2-3 pills per day.  Previous w/u included renin/aldosterone ratio which was normal. Other possibility is fibromuscular dysplasia, her chronic NSAID use, tobacco use.   Plan: --start HCTZ 12.5mg  daily --advised on continued efforts to decrease caffeine, cigarettes and NSAIDs --f/u in 1 month --if not adequately controlled on 2 class medication, would consider eval for fibromuscular dysplasia

## 2016-10-14 NOTE — Assessment & Plan Note (Addendum)
Patient tolerated switch to cymbalta from zoloft and has increased to 60mg  daily. She has been able to decrease her hydrocodone-apap 5-325mg  to BID dosing with occasional third dose if severe pain from her previous QID dosing.  Plan: --will f/u in 1 month and adjust future opioid scripts accordingly

## 2016-10-14 NOTE — Patient Instructions (Signed)
For your blood pressure, please start taking HCTZ (hydrochlorothiazide) 12.5mg  once a day.   Continue working on cutting down on smoking as you are able.   For your cymbalta, the next refill will be 60mg  capsules so you will only need to take one pill once a day.  I will see you in about one month. Let us know if anything comes up before then.

## 2016-10-14 NOTE — Assessment & Plan Note (Signed)
Patient tolerated switch to cymbalta from zoloft and has increased to 60mg  daily. She has been able to decrease her hydrocodone-apap 5-325mg  to BID dosing with occasional third dose if severe pain.  Plan: --changed script to cymbalta 60mg  capsules, patient aware of need to take only one pill per day when she picks up next refill.

## 2016-10-16 NOTE — Progress Notes (Signed)
Internal Medicine Clinic Attending  Case discussed with Dr. Svalina  at the time of the visit.  We reviewed the resident's history and exam and pertinent patient test results.  I agree with the assessment, diagnosis, and plan of care documented in the resident's note.  

## 2016-10-19 ENCOUNTER — Encounter (INDEPENDENT_AMBULATORY_CARE_PROVIDER_SITE_OTHER): Payer: Self-pay | Admitting: Orthopaedic Surgery

## 2016-10-19 ENCOUNTER — Ambulatory Visit (INDEPENDENT_AMBULATORY_CARE_PROVIDER_SITE_OTHER): Payer: Medicaid Other

## 2016-10-19 ENCOUNTER — Ambulatory Visit (INDEPENDENT_AMBULATORY_CARE_PROVIDER_SITE_OTHER): Payer: Medicaid Other | Admitting: Orthopaedic Surgery

## 2016-10-19 DIAGNOSIS — M84374D Stress fracture, right foot, subsequent encounter for fracture with routine healing: Secondary | ICD-10-CM

## 2016-10-19 NOTE — Progress Notes (Signed)
Melissa Walker is following up today for her metatarsal stress fracture. She is doing well overall. She denies any significant pain. She takes Norco as needed. X-ray show abundant callus formation with stable alignment. Her vitamin D and calcium was normal. Patient is healing fracture well. My standpoint she may use shoe wear as tolerated. Questions encouraged and answered. Follow-up as needed.

## 2016-11-08 ENCOUNTER — Other Ambulatory Visit: Payer: Self-pay | Admitting: Internal Medicine

## 2016-11-08 DIAGNOSIS — F419 Anxiety disorder, unspecified: Principal | ICD-10-CM

## 2016-11-08 DIAGNOSIS — F329 Major depressive disorder, single episode, unspecified: Secondary | ICD-10-CM

## 2016-11-14 ENCOUNTER — Other Ambulatory Visit: Payer: Self-pay | Admitting: Internal Medicine

## 2016-11-14 DIAGNOSIS — F419 Anxiety disorder, unspecified: Principal | ICD-10-CM

## 2016-11-14 DIAGNOSIS — F32A Depression, unspecified: Secondary | ICD-10-CM

## 2016-11-14 DIAGNOSIS — F329 Major depressive disorder, single episode, unspecified: Secondary | ICD-10-CM

## 2016-11-16 ENCOUNTER — Other Ambulatory Visit: Payer: Self-pay | Admitting: Internal Medicine

## 2016-11-16 DIAGNOSIS — F32A Depression, unspecified: Secondary | ICD-10-CM

## 2016-11-16 DIAGNOSIS — F329 Major depressive disorder, single episode, unspecified: Secondary | ICD-10-CM

## 2016-11-16 DIAGNOSIS — F419 Anxiety disorder, unspecified: Principal | ICD-10-CM

## 2016-11-20 NOTE — Telephone Encounter (Signed)
DULoxetine (CYMBALTA) 60 MG capsule, refill request. The pharmacy is still waiting for the office to reply for this Rx. Please call the patient back.

## 2016-12-02 ENCOUNTER — Encounter: Payer: Self-pay | Admitting: Internal Medicine

## 2016-12-18 ENCOUNTER — Other Ambulatory Visit: Payer: Self-pay | Admitting: Internal Medicine

## 2016-12-18 DIAGNOSIS — F419 Anxiety disorder, unspecified: Principal | ICD-10-CM

## 2016-12-18 DIAGNOSIS — F329 Major depressive disorder, single episode, unspecified: Secondary | ICD-10-CM

## 2016-12-18 MED ORDER — DULOXETINE HCL 60 MG PO CPEP: 60.0000 mg | ORAL_CAPSULE | Freq: Every day | ORAL | 2 refills | Status: DC

## 2016-12-18 NOTE — Telephone Encounter (Signed)
Called pt - no answer; left message - Cymbalta changed to take 1 cap daily per Dr Jari Favre and call for any questions.

## 2016-12-23 ENCOUNTER — Ambulatory Visit (INDEPENDENT_AMBULATORY_CARE_PROVIDER_SITE_OTHER): Payer: Medicaid Other | Admitting: Internal Medicine

## 2016-12-23 ENCOUNTER — Encounter: Payer: Self-pay | Admitting: Internal Medicine

## 2016-12-23 VITALS — BP 146/81 | HR 82 | Temp 97.6°F | Ht 62.0 in | Wt 97.5 lb

## 2016-12-23 DIAGNOSIS — G8929 Other chronic pain: Secondary | ICD-10-CM

## 2016-12-23 DIAGNOSIS — Z9889 Other specified postprocedural states: Secondary | ICD-10-CM

## 2016-12-23 DIAGNOSIS — M5417 Radiculopathy, lumbosacral region: Secondary | ICD-10-CM | POA: Diagnosis not present

## 2016-12-23 DIAGNOSIS — Z79899 Other long term (current) drug therapy: Secondary | ICD-10-CM

## 2016-12-23 DIAGNOSIS — Z79891 Long term (current) use of opiate analgesic: Secondary | ICD-10-CM | POA: Diagnosis not present

## 2016-12-23 DIAGNOSIS — Z681 Body mass index (BMI) 19 or less, adult: Secondary | ICD-10-CM

## 2016-12-23 DIAGNOSIS — M545 Low back pain: Secondary | ICD-10-CM | POA: Diagnosis not present

## 2016-12-23 DIAGNOSIS — Z23 Encounter for immunization: Secondary | ICD-10-CM

## 2016-12-23 DIAGNOSIS — F17210 Nicotine dependence, cigarettes, uncomplicated: Secondary | ICD-10-CM | POA: Diagnosis not present

## 2016-12-23 DIAGNOSIS — R636 Underweight: Secondary | ICD-10-CM

## 2016-12-23 DIAGNOSIS — I10 Essential (primary) hypertension: Secondary | ICD-10-CM | POA: Diagnosis not present

## 2016-12-23 MED ORDER — HYDROCODONE-ACETAMINOPHEN 5-325 MG PO TABS: 1.0000 | ORAL_TABLET | Freq: Three times a day (TID) | ORAL | 0 refills | Status: DC | PRN

## 2016-12-23 MED ORDER — CYCLOBENZAPRINE HCL 5 MG PO TABS: 5.0000 mg | ORAL_TABLET | Freq: Three times a day (TID) | ORAL | 0 refills | Status: DC | PRN

## 2016-12-23 MED ORDER — HYDROCHLOROTHIAZIDE 25 MG PO TABS: 25.0000 mg | ORAL_TABLET | Freq: Every day | ORAL | 5 refills | Status: DC

## 2016-12-23 NOTE — Patient Instructions (Addendum)
We'll increase your blood pressure medicine, HCTZ to 25mg  once a day; I'll check your electrolyte levels today and call you if they are abnormal.   I have refilled your norco that you can take up to 3 times a day. We changed your muscle relaxant to flexeril 5mg  up to 3 times a day; based on how you respond to it, we can increase the dose in the future.  Please follow up in one month to recheck your blood pressure.

## 2016-12-23 NOTE — Progress Notes (Signed)
   CC: hypertension, back pain  HPI:  [name] is a 36 y.o. with a PMH of HTN, tobacco use, and chronic back pain presenting for follow up on her HTN and back pain.  HTN: At last visit, patient was started on HCTZ 12.5mg  daily; she states that her BP at home has been in 140s SBP. She denies further headaches; she denies chest pain, shortness of breath, vision or hearing changes, or LE swelling.  Chronic low back pain: Patient s/p L5/S1 laminectomy and microdiscectomy in 2016; she is on a pain contract with [facility] for Norco and is also using cymbalta and robaxin as adjuvant therapy to control her pain. She states that overall she has been doing better since starting cymbalta and had been using only 2 norco per day usually compared to her previous 4 per day. About 4 weeks ago, her son landed on her mid back which exacerbated her pain; she now has radicular pain to both legs (previously only to right), and endorses 1 day history of numbness in her feet, but denies weakness, falls, bowel or bladder incontinence. She states since that time, she has used 3 norco per day; she ran out about 2 weeks ago and denies withdrawal symptoms. She has been attempting to control her pain with cymbalta, robaxin and PRN tylenol with some relief to accomplish her needed tasks.   Please see problem based Assessment and Plan for status of patients chronic conditions.  Past Medical History:  Diagnosis Date  . Abnormal uterine bleeding 02/04/2012   Occurred 01/12/12-01/15/12. Evaluated in MAU and all labs, exam, and pelvic u/s WNL. Given rx for ibuprofen and told to follow up as otupatient.    . Cholelithiasis 10/26/2014  . Chronic back pain   . Neuromuscular disorder ([place])   . Tobacco abuse     Review of Systems:   ROS Per HPI  Physical Exam:  Vitals:   12/23/16 1512 12/23/16 1549  BP: (!) 154/86 (!) 146/81  Pulse: 92 82  Temp: 97.6 F (36.4 C)   TempSrc: Oral   SpO2: 100%   Weight: 97 lb 8 oz  (44.2 kg)   Height: 5\' 2"  (1.575 m)    GENERAL- alert, co-operative, appears as stated age, underweight, not in any distress. HEENT- Atraumatic, normocephalic, EOMI, oral mucosa appears moist CARDIAC- RRR, no murmurs, rubs or gallops. RESP- Moving equal volumes of air, and clear to auscultation bilaterally, no wheezes or crackles. ABDOMEN- Soft, nontender, bowel sounds present. NEURO- No obvious Cr N abnormality. EXTREMITIES- pulse 2+, symmetric, no pedal edema. No spinal tenderness; mild tenderness to palpation of her R SI joint, no paraspinal tenderness. LE strength, ROM, and sensation intact. SKIN- Warm, dry, no rash or lesion. PSYCH- Normal mood and affect, appropriate thought content and speech.  Assessment & Plan:   See Encounters Tab for problem based charting.   Patient discussed with Dr. Abelardo Diesel, MD Internal Medicine PGY2

## 2016-12-24 ENCOUNTER — Encounter: Payer: Self-pay | Admitting: Internal Medicine

## 2016-12-24 ENCOUNTER — Other Ambulatory Visit: Payer: Self-pay | Admitting: Internal Medicine

## 2016-12-24 LAB — BMP8+ANION GAP
Anion Gap: 16 mmol/L (ref 10.0–18.0)
BUN/Creatinine Ratio: 6 — ABNORMAL LOW (ref 9–23)
BUN: 4 mg/dL — ABNORMAL LOW (ref 6–20)
CHLORIDE: 96 mmol/L (ref 96–106)
CO2: 26 mmol/L (ref 20–29)
CREATININE: 0.72 mg/dL (ref 0.57–1.00)
Calcium: 9.4 mg/dL (ref 8.7–10.2)
GFR calc Af Amer: 124 mL/min/{1.73_m2} (ref >59)
GFR calc non Af Amer: 107 mL/min/{1.73_m2} (ref >59)
GLUCOSE: 85 mg/dL (ref 65–99)
Potassium: 3.5 mmol/L (ref 3.5–5.2)
SODIUM: 138 mmol/L (ref 134–144)

## 2016-12-24 NOTE — Progress Notes (Signed)
Internal Medicine Clinic Attending  Case discussed with Dr. Svalina  at the time of the visit.  We reviewed the resident's history and exam and pertinent patient test results.  I agree with the assessment, diagnosis, and plan of care documented in the resident's note.  

## 2016-12-24 NOTE — Assessment & Plan Note (Signed)
Melissa Walker is on chronic opioid therapy for chronic pain. The date of the controlled substances contract is referenced in the [place] and / or the overview. Date of pain contract was 11/14/15. As part of the treatment plan, the [place] controlled substance database is checked at least twice yearly and the database results are appropriate. I have last reviewed the results on 12/24/16.   The last UDS was on 08/05/16 and results are as expected. Patient needs at least a yearly UDS.   The patient is on hydrocodone/acetaminophen (Lorcet, Lortab, Norco, Vicodin) strength 5, 120 per 30 days however this was decreased to 90 per month based on her usage and better pain control since starting cymbalta. Adjunctive treatment includes Cymbalta and Muscle relaxants. This regimen allows Melissa Walker to function and does not cause excessive sedation or other side effects.  "The benefits of continuing opioid therapy outweigh the risks and chronic opioids will be continued. Ongoing education about safe opioid treatment is provided  Interventions today include: Refills - 3 paper Rx printed - changed monthly dose to 90 D/c robaxin, start Flexeril 5mg  TID PRN  Follow up in 3 months, sooner if needed

## 2016-12-24 NOTE — Telephone Encounter (Signed)
Melissa Walker [phone], NURSE NEEDS TO CALL TO AUTHORIZE [place]ICA[place] WILL COVER PAIN MEDICATION, NORECO 500MG .

## 2016-12-24 NOTE — Telephone Encounter (Signed)
Needs PA thru [place] [facility], medicaid, for norco, please asap

## 2016-12-24 NOTE — Assessment & Plan Note (Addendum)
At last visit, patient was started on HCTZ 12.5mg  daily; she states that her BP at home has been in 140s SBP. She denies further headaches; she denies chest pain, shortness of breath, vision or hearing changes, or LE swelling.  BP elevated even on repeat today.  Plan: --increase HCTZ to 25mg  daily --f/u Bmet as only diuretic naive - electrolytes and renal function wnl --f/u in 1 month for BP check

## 2016-12-24 NOTE — Telephone Encounter (Signed)
PA request form and last office note faxed to [facility] fax# [phone].  Decision may take up to 48hrs.  Will await decision.[name]10/25/201812:06 PM

## 2016-12-24 NOTE — Assessment & Plan Note (Signed)
Patient s/p L5/S1 laminectomy and microdiscectomy in 2016; she is on a pain contract with [facility] for Norco and is also using cymbalta and robaxin as adjuvant therapy to control her pain. She states that overall she has been doing better since starting cymbalta and had been using only 2 norco per day usually compared to her previous 4 per day. About 4 weeks ago, her son landed on her mid back which exacerbated her pain; she now has radicular pain to both legs (previously only to right), and endorses 1 day history of numbness in her feet, but denies weakness, falls, bowel or bladder incontinence. She states since that time, she has used 3 norco per day; she ran out about 2 weeks ago and denies withdrawal symptoms. She has been attempting to control her pain with cymbalta, robaxin and PRN tylenol with some relief to accomplish her needed tasks.  Exam with only mild tenderness to R SI joint; otherwise strength and sensation intact. No indications for re-imaging at this time.  Plan: --change robaxin to flexeril as more available in pharmacies - start out at flexeril 5mg  TID PRN, advised about possible side effects of dizziness, drowsiness --continue cymbalta --refilled norco 5-325mg  TID PRN #90 as that was the most she was using prior to running out; if stable will re-do her Pain contract to reflect change in monthly script.

## 2016-12-31 NOTE — Telephone Encounter (Signed)
Was approved and pt has picked up

## 2017-01-04 ENCOUNTER — Inpatient Hospital Stay (HOSPITAL_COMMUNITY)
Admission: AD | Admit: 2017-01-04 | Discharge: 2017-01-04 | Discharge status: Against Medical Advice | Payer: Medicaid Other | Source: Ambulatory Visit | Attending: Obstetrics & Gynecology | Admitting: Obstetrics & Gynecology

## 2017-01-04 DIAGNOSIS — Z5321 Procedure and treatment not carried out due to patient leaving prior to being seen by health care provider: Secondary | ICD-10-CM | POA: Insufficient documentation

## 2017-01-04 LAB — URINALYSIS, ROUTINE W REFLEX MICROSCOPIC
BILIRUBIN URINE: NEGATIVE
Glucose, UA: NEGATIVE mg/dL
Hgb urine dipstick: NEGATIVE
KETONES UR: NEGATIVE mg/dL
Nitrite: NEGATIVE
PH: 5 (ref 5.0–8.0)
Protein, ur: NEGATIVE mg/dL
SPECIFIC GRAVITY, URINE: 1.011 (ref 1.005–1.030)

## 2017-01-04 LAB — POCT PREGNANCY, URINE: PREG TEST UR: NEGATIVE

## 2017-01-04 NOTE — Progress Notes (Signed)
Patient left without being seen by provider.

## 2017-01-04 NOTE — MAU Note (Signed)
Pt presents to MAU with complaints of having a knot under her left arm for  Four days.

## 2017-01-27 ENCOUNTER — Other Ambulatory Visit: Payer: Self-pay

## 2017-01-27 ENCOUNTER — Ambulatory Visit: Payer: Medicaid Other | Admitting: Internal Medicine

## 2017-01-27 VITALS — BP 128/78 | HR 103 | Temp 97.6°F | Ht 62.0 in | Wt 99.9 lb

## 2017-01-27 DIAGNOSIS — I10 Essential (primary) hypertension: Secondary | ICD-10-CM | POA: Diagnosis not present

## 2017-01-27 DIAGNOSIS — R590 Localized enlarged lymph nodes: Secondary | ICD-10-CM | POA: Insufficient documentation

## 2017-01-27 DIAGNOSIS — M5417 Radiculopathy, lumbosacral region: Secondary | ICD-10-CM | POA: Diagnosis not present

## 2017-01-27 MED ORDER — CYCLOBENZAPRINE HCL 5 MG PO TABS: 5.0000 mg | ORAL_TABLET | Freq: Three times a day (TID) | ORAL | 1 refills | Status: DC | PRN

## 2017-01-27 NOTE — Patient Instructions (Signed)
Keep taking HCTZ 25mg  daily; continue checking your blood pressure every once in a while and bring in a log to your appointments.  The lump in your armpit is a lymph node, and I agree its a reaction to the flu shot that is expected; we'll keep a watch on it for now and it should start getting smaller and less painful as time goes on.

## 2017-01-27 NOTE — Progress Notes (Signed)
   CC: hypertension  HPI:  [name] is a 36 y.o. with a PMH of HTN, chronic low back pain s/p laminectomy with lumbosacral radiculopathy presenting to clinic for follow up on hypertension and evaluation of axillary lymphadenopathy.  HTN: Patient compliant with HCTZ 25mg  daily; she has checked her BP at home, last time was a couple of days ago and SBP was 127; she has had some elevated BPs but she attributed these to eating a lot of ham during the holidays. She denies chest pain, shortness of breath, vision changes; her headaches are rare now and overall significantly improved.   Lumbosacral radiculopathy: At last visit, robaxin was changed to flexeril 5mg  TID prn; patient states flexeril has been more helpful in helping control her pain and would like to continue with this change.   Axillary lymphadenopathy: Patient reports left sided axillary nodule that began a couple of days since receiving her annual flu shot in the left arm. She states that it is tender; she was evaluated at Urgent Care and told it was a reactive lymph node. She denies other lymphadenopathy, fevers, chills, appetite loss, weight loss (has actually gained weight); she endorses chronic nightsweats for years that are cyclical with her menstrual cycle. She denies current or prior IVDU.   Please see problem based Assessment and Plan for status of patients chronic conditions.  Past Medical History:  Diagnosis Date  . Abnormal uterine bleeding 02/04/2012   Occurred 01/12/12-01/15/12. Evaluated in MAU and all labs, exam, and pelvic u/s WNL. Given rx for ibuprofen and told to follow up as otupatient.    . Cholelithiasis 10/26/2014  . Chronic back pain   . Neuromuscular disorder ([place])   . Tobacco abuse     Review of Systems:   ROS Per HPI  Physical Exam:  Vitals:   01/27/17 1518 01/27/17 1546  BP: (!) 144/76 128/78  Pulse: (!) 103   Temp: 97.6 F (36.4 C)   TempSrc: Oral   SpO2: 100%   Weight: 99 lb  14.4 oz (45.3 kg)   Height: 5\' 2"  (1.575 m)    GENERAL- alert, co-operative, appears as stated age, not in any distress. HEENT- Atraumatic, normocephalic, EOMI, oral mucosa appears moist, Sub-centimeter, rubbery, mobile, non-tender anterior cervical lymphnode on left that is chronic. No other lymphadenopathy in head/neck CARDIAC- RRR, no murmurs, rubs or gallops. RESP- Moving equal volumes of air, and clear to auscultation bilaterally, no wheezes or crackles. ABDOMEN- Soft, nontender, bowel sounds present. No inguinal lymphadenopathy NEURO- No obvious Cr N abnormality. EXTREMITIES- pulse 2+, symmetric, no pedal edema. Left axillary lymphadenopathy - ~1.5cm lymph node that is tender, mobile, rubbery. SKIN- Warm, dry, no rash or lesion. PSYCH- Normal mood and affect, appropriate thought content and speech.  Assessment & Plan:   See Encounters Tab for problem based charting.   Patient discussed with Dr. Angelia Mould   Alphonzo Grieve, MD Internal Medicine PGY2

## 2017-01-28 ENCOUNTER — Encounter: Payer: Self-pay | Admitting: Internal Medicine

## 2017-01-28 NOTE — Assessment & Plan Note (Signed)
Patient reports left sided axillary nodule that began a couple of days since receiving her annual flu shot in the left arm. She states that it is tender; she was evaluated at Urgent Care and told it was a reactive lymph node. She denies other lymphadenopathy, fevers, chills, appetite loss, weight loss (has actually gained weight); she endorses chronic nightsweats for years that are cyclical with her menstrual cycle. She denies current or prior IVDU.   Exam remarkable for 1.5cm left axillary lymphadenopathy - mobile, rubbery, tender. Other than her chronic left anterior cervical lymphnode which was found to be benign, she had no other lymphadenopathy. There was no evidence of skin break, cellulitis or abscess in axillary region.  She had a recent neg HIV screen and denies high risk factors or findings that would warrant retesting at this time.   Etiology is likely reactive from her flu vaccination.  Plan: --continue monitoring --advised to RTC if she notices change or new symptoms.

## 2017-01-28 NOTE — Assessment & Plan Note (Signed)
Patient compliant with HCTZ 25mg  daily; she has checked her BP at home, last time was a couple of days ago and SBP was 127; she has had some elevated BPs but she attributed these to eating a lot of ham during the holidays. She denies chest pain, shortness of breath, vision changes; her headaches are rare now and overall significantly improved.  BP well controlled on repeat measurement.  Plan: --continue HCTZ 25mg  daily --advised salt and caffeine abstinence --advised smoking cessation

## 2017-01-28 NOTE — Assessment & Plan Note (Signed)
At last visit, robaxin was changed to flexeril 5mg  TID prn; patient states flexeril has been more helpful in helping control her pain and would like to continue with this change.  Plan: --refilled flexeril 5mg  TID PRN

## 2017-02-01 NOTE — Progress Notes (Signed)
Internal Medicine Clinic Attending  Case discussed with Dr. Svalina  at the time of the visit.  We reviewed the resident's history and exam and pertinent patient test results.  I agree with the assessment, diagnosis, and plan of care documented in the resident's note.  

## 2017-03-16 ENCOUNTER — Other Ambulatory Visit: Payer: Self-pay | Admitting: Internal Medicine

## 2017-03-24 ENCOUNTER — Other Ambulatory Visit: Payer: Self-pay

## 2017-03-24 ENCOUNTER — Encounter: Payer: Self-pay | Admitting: Internal Medicine

## 2017-03-24 ENCOUNTER — Ambulatory Visit: Payer: Medicaid Other | Admitting: Internal Medicine

## 2017-03-24 VITALS — BP 128/80 | HR 118 | Temp 98.0°F | Ht 63.0 in | Wt 104.4 lb

## 2017-03-24 DIAGNOSIS — Z79891 Long term (current) use of opiate analgesic: Secondary | ICD-10-CM | POA: Diagnosis not present

## 2017-03-24 DIAGNOSIS — M5417 Radiculopathy, lumbosacral region: Secondary | ICD-10-CM | POA: Diagnosis present

## 2017-03-24 DIAGNOSIS — G8929 Other chronic pain: Secondary | ICD-10-CM

## 2017-03-24 DIAGNOSIS — M545 Low back pain: Secondary | ICD-10-CM | POA: Diagnosis not present

## 2017-03-24 DIAGNOSIS — Z791 Long term (current) use of non-steroidal anti-inflammatories (NSAID): Secondary | ICD-10-CM

## 2017-03-24 DIAGNOSIS — F17210 Nicotine dependence, cigarettes, uncomplicated: Secondary | ICD-10-CM

## 2017-03-24 DIAGNOSIS — Z79899 Other long term (current) drug therapy: Secondary | ICD-10-CM | POA: Diagnosis not present

## 2017-03-24 DIAGNOSIS — R0789 Other chest pain: Secondary | ICD-10-CM

## 2017-03-24 DIAGNOSIS — I10 Essential (primary) hypertension: Secondary | ICD-10-CM

## 2017-03-24 DIAGNOSIS — R079 Chest pain, unspecified: Secondary | ICD-10-CM | POA: Diagnosis not present

## 2017-03-24 DIAGNOSIS — R05 Cough: Secondary | ICD-10-CM

## 2017-03-24 MED ORDER — CYCLOBENZAPRINE HCL 5 MG PO TABS: 5.0000 mg | ORAL_TABLET | Freq: Three times a day (TID) | ORAL | 2 refills | Status: DC | PRN

## 2017-03-24 MED ORDER — HYDROCODONE-ACETAMINOPHEN 5-325 MG PO TABS: 1.0000 | ORAL_TABLET | Freq: Three times a day (TID) | ORAL | 0 refills | Status: DC | PRN

## 2017-03-24 MED ORDER — ALBUTEROL SULFATE HFA 108 (90 BASE) MCG/ACT IN AERS: 2.0000 | INHALATION_SPRAY | Freq: Four times a day (QID) | RESPIRATORY_TRACT | 2 refills | Status: DC | PRN

## 2017-03-24 NOTE — Patient Instructions (Addendum)
FOLLOW-UP INSTRUCTIONS When: 3 months For: blood pressure, back pain  We'll continue the norco three times a day and I have given you extra pills on days you may need a 4th dose.   I refilled the flexeril to take up to three times a day.  I have placed an order for a chest xray. I have also given a script for albuterol inhaler to use as needed which may help with the pain or if you notice shortness of breath.

## 2017-03-24 NOTE — Progress Notes (Signed)
   CC: back pain  HPI:  Melissa Walker is a 37 y.o. with a PMH of HTN, chronic low back pain s/p laminectomy with lumbosacral radiculopathy presenting to clinic for follow up for back pain, hypertension and cough.  Chronic low back pain w/ lumbosacral radiculpathy: Patient with chronic low back pain that she reports is more bothersome in the winter months. Current regimen is flexeril 5mg  TID PRN, duloxetine 60mg  daily, and norco 5-325mg  TID PRN (decreased from QID after starting duloxetine due to improvement of symptoms); she is also on meloxicam 7.5mg  daily per Dr. Rita Ohara (neurosurg). Today she states that during the colder weather, she feels she needs a fourth dose of norco for pain control about half of the days out of a week. She endorses compliance with above regimen and no change in her pain. She is still having intermittent radiation to her right leg and intermittent tingling to her right foot. She denies LE weakness, falls, bowel or bladder incontinence.   HTN: Patient compliant with HCTZ 25mg  daily; she denies chest pain, shortness of breath, headaches, vision or hearing changes. She states she rarely drinks coffee but still consumes ~5 cups of soda per day.   Cough: Patient with continued mild, nonproductive cough since last visit at end of Nov at which time she had a viral URI. For the past week she has noticed right sided chest pain that is intermittent and not associated with exertion, movement, cough; symptom onset and relief without apparent triggers. She denies hemoptysis, wheezing, LE swelling or calf pain/redness.  Please see problem based Assessment and Plan for status of patients chronic conditions.  Past Medical History:  Diagnosis Date  . Abnormal uterine bleeding 02/04/2012   Occurred 01/12/12-01/15/12. Evaluated in MAU and all labs, exam, and pelvic u/s WNL. Given rx for ibuprofen and told to follow up as otupatient.    . Cholelithiasis 10/26/2014  . Chronic back  pain   . Neuromuscular disorder ([place])   . Tobacco abuse     Review of Systems:   ROS Per HPI  Physical Exam:  Vitals:   03/24/17 1416 03/24/17 1500  BP: (!) 141/71 128/80  Pulse: (!) 118   Temp: 98 F (36.7 C)   TempSrc: Oral   SpO2: 98%   Weight: 104 lb 6.4 oz (47.4 kg)   Height: 5\' 3"  (1.6 m)    GENERAL- alert, co-operative, appears as stated age, not in any distress. HEENT- no lymphadenopathy CARDIAC- RRR, no murmurs, rubs or gallops. RESP- Moving equal volumes of air, and clear to auscultation bilaterally; mild wheezing focally in right lower lung base. ABDOMEN- Soft, nontender, bowel sounds present. NEURO- No obvious Cr N abnormality. EXTREMITIES- pulse 2+, symmetric, no pedal edema. No spinal tenderness, no paraspinal SI joint tenderness. Bil LE strength and sensation intact. SKIN- Warm, dry. PSYCH- Normal mood and affect, appropriate thought content and speech.  Assessment & Plan:   See Encounters Tab for problem based charting.   Patient discussed with Dr. Angelia Mould   Alphonzo Grieve, MD Internal Medicine PGY2

## 2017-03-25 ENCOUNTER — Ambulatory Visit (HOSPITAL_COMMUNITY)
Admission: RE | Admit: 2017-03-25 | Discharge: 2017-03-25 | Disposition: A | Discharge status: Home / Self Care | Payer: Medicaid Other | Source: Ambulatory Visit | Attending: Internal Medicine | Admitting: Internal Medicine

## 2017-03-25 DIAGNOSIS — R0789 Other chest pain: Secondary | ICD-10-CM | POA: Diagnosis not present

## 2017-03-25 NOTE — Assessment & Plan Note (Signed)
Stable overall with slight worsening of symptoms during the winter months. Overall she still requiring has opioid medication the last year.   She has follow-up with her surgeon next month.  Plan --Norco 5-3 25 mg 3 times a day when necessary #112; increased pill count to 112 from 90 to accommodate an extra dose about 1/2 the days. --refill flexeril 5mg  TID PRN --continue duloxetine 60mg  daily --continue meloxicam 7.5mg  daily per Dr. Rita Ohara

## 2017-03-25 NOTE — Assessment & Plan Note (Signed)
Well controlled on recheck. Patient is asymptomatic.  Plan --Continue hydrochlorothiazide 25 mg daily

## 2017-03-26 ENCOUNTER — Encounter: Payer: Self-pay | Admitting: Internal Medicine

## 2017-03-26 NOTE — Assessment & Plan Note (Signed)
Melissa Walker is on chronic opioid therapy for chronic pain. The date of the controlled substances contract is referenced in the [place] and / or the overview. Date of pain contract was 11/14/2015. As part of the treatment plan, the [place] controlled substance database is checked at least twice yearly and the database results are appropriate. I have last reviewed the results on 03/24/2016.   The last UDS was on 08/05/2016 and results are as expected. Patient needs at least a yearly UDS.   The patient is on hydrocodone/acetaminophen (Lorcet, Lortab, Norco, Vicodin) strength 3-325mg , #112 per 30 days. Adjunctive treatment includes Cymbalta, NSAID's and Muscle relaxants. This regimen allows Melissa Walker to function and does not cause excessive sedation or other side effects.  "The benefits of continuing opioid therapy outweigh the risks and chronic opioids will be continued. Ongoing education about safe opioid treatment is provided  Interventions today include: Increased pill count to #112 after discussion with patient so she may have an extra dose ~1/2 the days, as her pain is exacerbated by cold weather. #3 refilled provided Refilled flexeril 5mg  TID PRN Patient to see her neurosurgeon next month F/u in 3 months

## 2017-03-26 NOTE — Progress Notes (Signed)
Internal Medicine Clinic Attending  Case discussed with Dr. Svalina  at the time of the visit.  We reviewed the resident's history and exam and pertinent patient test results.  I agree with the assessment, diagnosis, and plan of care documented in the resident's note.  

## 2017-03-26 NOTE — Assessment & Plan Note (Signed)
Patient with right sided chest pain; exam only with focal wheezing in LL lung base. No s/s or RF for PE.  Plan: --CXR to evaluate for focal pneumonia - negative.

## 2017-04-01 ENCOUNTER — Telehealth: Payer: Self-pay | Admitting: *Deleted

## 2017-04-01 ENCOUNTER — Encounter (HOSPITAL_COMMUNITY): Payer: Self-pay | Admitting: Family Medicine

## 2017-04-01 ENCOUNTER — Other Ambulatory Visit: Payer: Self-pay

## 2017-04-01 ENCOUNTER — Ambulatory Visit (HOSPITAL_COMMUNITY)
Admission: EM | Admit: 2017-04-01 | Discharge: 2017-04-01 | Disposition: A | Discharge status: Home / Self Care | Payer: Medicaid Other | Attending: Physician Assistant | Admitting: Physician Assistant

## 2017-04-01 DIAGNOSIS — R002 Palpitations: Secondary | ICD-10-CM

## 2017-04-01 DIAGNOSIS — R000 Tachycardia, unspecified: Secondary | ICD-10-CM

## 2017-04-01 DIAGNOSIS — E861 Hypovolemia: Secondary | ICD-10-CM

## 2017-04-01 DIAGNOSIS — E876 Hypokalemia: Secondary | ICD-10-CM

## 2017-04-01 DIAGNOSIS — R358 Other polyuria: Secondary | ICD-10-CM

## 2017-04-01 DIAGNOSIS — R3589 Other polyuria: Secondary | ICD-10-CM

## 2017-04-01 LAB — POCT I-STAT, CHEM 8
BUN: 3 mg/dL — ABNORMAL LOW (ref 6–20)
CALCIUM ION: 1.15 mmol/L (ref 1.15–1.40)
Chloride: 94 mmol/L — ABNORMAL LOW (ref 101–111)
Creatinine, Ser: 0.7 mg/dL (ref 0.44–1.00)
Glucose, Bld: 104 mg/dL — ABNORMAL HIGH (ref 65–99)
HCT: 42 % (ref 36.0–46.0)
Hemoglobin: 14.3 g/dL (ref 12.0–15.0)
Potassium: 3.2 mmol/L — ABNORMAL LOW (ref 3.5–5.1)
SODIUM: 136 mmol/L (ref 135–145)
TCO2: 28 mmol/L (ref 22–32)

## 2017-04-01 MED ORDER — ATENOLOL 25 MG PO TABS: 12.5000 mg | ORAL_TABLET | Freq: Every day | ORAL | 0 refills | Status: DC

## 2017-04-01 MED ORDER — LISINOPRIL 20 MG PO TABS: 20.0000 mg | ORAL_TABLET | Freq: Every day | ORAL | 0 refills | Status: DC

## 2017-04-01 NOTE — ED Provider Notes (Addendum)
04/01/2017 6:57 PM   DOB: 04-18-1980 / MRN: [id number]  SUBJECTIVE:  Melissa Walker is a 37 y.o. female presenting for palpitations that started last night.  She has been taking hydrochlorothiazide now for about 6 months.  She denies chest pain, shortness of breath, dizziness at this time.  She tells me that she is actually gaining weight albeit slowly.  She denies any illicit drugs today.  She refuses a urinalysis at this time.  She denies dysuria, urgency, frequency, back pain, fever.  She is allergic to penicillins and nicotine.   She  has a past medical history of Abnormal uterine bleeding (02/04/2012), Cholelithiasis (10/26/2014), Chronic back pain, Neuromuscular disorder ([facility]), and Tobacco abuse.    She  reports that she has been smoking cigarettes.  She has a 18.00 pack-year smoking history. she has never used smokeless tobacco. She reports that she drinks alcohol. She reports that she does not use drugs. She  reports that she currently engages in sexual activity. She reports using the following methods of birth control/protection: Condom and Surgical. The patient  has a past surgical history that includes Dental rehabilitation; Multiple epidural steroid injections in back; Tubal ligation (Bilateral, 05/11/2013); and Lumbar laminectomy/decompression microdiscectomy (Right, 07/23/2014).  Her family history includes Cancer in her father and maternal grandmother; Eclampsia in her maternal grandmother; Heart attack in her maternal grandmother; Heart disease in her maternal grandmother; Hyperlipidemia in her maternal grandmother; Hypertension in her maternal grandmother.  Review of Systems  Constitutional: Negative for chills, diaphoresis and fever.  Eyes: Negative.   Respiratory: Negative for cough, hemoptysis, sputum production, shortness of breath and wheezing.   Cardiovascular: Positive for palpitations. Negative for chest pain, orthopnea and leg swelling.  Gastrointestinal: Negative for  nausea.  Skin: Negative for rash.  Neurological: Negative for dizziness, sensory change, speech change, focal weakness and headaches.    OBJECTIVE:  BP 139/71   Pulse (!) 117   Temp 98.3 F (36.8 C)   Resp 18   LMP 03/01/2017   SpO2 100%   Wt Readings from Last 3 Encounters:  03/24/17 104 lb 6.4 oz (47.4 kg)  01/27/17 99 lb 14.4 oz (45.3 kg)  01/04/17 97 lb (44 kg)   Temp Readings from Last 3 Encounters:  04/01/17 98.3 F (36.8 C)  03/24/17 98 F (36.7 C) (Oral)  01/27/17 97.6 F (36.4 C) (Oral)   BP Readings from Last 3 Encounters:  04/01/17 139/71  03/24/17 128/80  01/27/17 128/78   Pulse Readings from Last 3 Encounters:  04/01/17 (!) 117  03/24/17 (!) 118  01/27/17 (!) 103     Physical Exam  Constitutional: She is active.  Non-toxic appearance.  Cardiovascular: Regular rhythm and intact distal pulses.  No extrasystoles are present. Tachycardia present. Exam reveals no gallop and no friction rub.  No murmur heard. Pulmonary/Chest: Effort normal. No tachypnea.  Neurological: She is alert.  Skin: Skin is warm and dry. She is not diaphoretic. No pallor.    Results for orders placed or performed during the hospital encounter of 04/01/17  I-STAT, chem 8  Result Value Ref Range   Sodium 136 135 - 145 mmol/L   Potassium 3.2 (L) 3.5 - 5.1 mmol/L   Chloride 94 (L) 101 - 111 mmol/L   BUN 3 (L) 6 - 20 mg/dL   Creatinine, Ser 0.70 0.44 - 1.00 mg/dL   Glucose, Bld 104 (H) 65 - 99 mg/dL   Calcium, Ion 1.15 1.15 - 1.40 mmol/L   TCO2 28 22 - 32  mmol/L   Hemoglobin 14.3 12.0 - 15.0 g/dL   HCT 42.0 36.0 - 46.0 %     No results found.  ASSESSMENT AND PLAN:   Palpitations: EKG is reassuring.  I-STAT shows hypokalemia with a mildly low chloride.  Creatinine normal.  Unfortunately hemoglobin and hematocrit are normal as well.  She had a chest x-ray that was normal within the last month also reassuring.  We will go ahead and stop her hydrochlorothiazide and will start  her on a medium dose of lisinopril at a very low dose of atenolol.  She will start these tonight.  She will follow-up with her primary care provider in the next 3-4 days.  She has chest pain, shortness of breath and she will go to the ED. she has had bilateral tubal ligation.  Sinus tachycardia by electrocardiogram  Hypokalemia  Hypovolemia associated with diuresis      The patient is advised to call or return to clinic if she does not see an improvement in symptoms, or to seek the care of the closest emergency department if she worsens with the above plan.   Philis Fendt, MHS, PA-C 04/01/2017 6:57 PM    Tereasa Coop, PA-C 04/01/17 1859    Tereasa Coop, PA-C 04/01/17 1901

## 2017-04-01 NOTE — Telephone Encounter (Signed)
Called patient back ~4pm. She states she has had persistent palpitations for last day or so; not having chest pain, shortness of breath, dizziness, syncope or presyncope. She notes persistent tachycardia on BP checks from 100-120 even first thing in AM before drinking soda or smoking cigarettes.   She was seen a couple of weeks ago with right sided chest pain that was diffuse and not limiting; at the time had mild cough that was not productive and w/o hemoptysis; CXR negative. Other than smoking, no other RF or signs/symptoms for VTE. She has been persistently, mildly tachycardic on f/u visits, however had not been symptomatic.  Patient stated that it would be ~5:30pm before she could make it to the clinic, so she was advised to go to Urgent Care for evaluation of newly symptomatic tachycardia and palpitations.  Patient in agreement with plan. Discussed with Dr. Lynnae January.  Alphonzo Grieve, MD IMTS - PGY2

## 2017-04-01 NOTE — ED Notes (Signed)
Patient is unable to void at this time 

## 2017-04-01 NOTE — Telephone Encounter (Signed)
Talked to Dr Jari Favre - stated she will call pt.

## 2017-04-01 NOTE — Discharge Instructions (Signed)
Stop taking your hydrochlorothiazide.  Go ahead and start the atenolol one half tab tonight along with the lisinopril 20 mg tonight.  Please try to follow-up with your primary doctor in the next 3-4 days.  If at any point you begin having chest pain, shortness of breath, dizziness then please go to the ED.

## 2017-04-01 NOTE — Telephone Encounter (Signed)
Returned pt's call - concerned about rapid heart rate; this am BP- 129/84 P- 111. Last several days VS 129/89 103;  135/93 103; 142/82 112. Stated last time she was here (1/23) c/i chest hurting and CXR was done. Stated pain in chest is somewhat better. Also asked having cough, fever or any other pain. No other pain. C/o slight cough. No wheezing-so she has not used inhaler in several days. Stated wants to know what to do about "her chest pounding". [place] [phone].

## 2017-04-01 NOTE — ED Triage Notes (Signed)
Pt here for increased heart rate since last night. She has checked her HR multiple times and in the 100s and pounding. Denies chest pain and SOB. sts that she had an xray last week and negative. Langley Gauss recent illness.

## 2017-04-01 NOTE — Telephone Encounter (Signed)
Pt states BP 97/84 HR 120; "feels like heart is coming out of my chest" .

## 2017-04-20 ENCOUNTER — Telehealth: Payer: Self-pay | Admitting: Internal Medicine

## 2017-04-20 NOTE — Telephone Encounter (Signed)
Spoke w/ pt this am, appt [facility] her choosing for 2/20 [facility]

## 2017-04-20 NOTE — Telephone Encounter (Signed)
Patient is atenolol 25mg  1/2 pill, lisinopril got these medicine they put her on at the urgent care. Her bp 162/102 this morning heart rate 85 at resting. Please call contact patient

## 2017-04-21 ENCOUNTER — Ambulatory Visit (INDEPENDENT_AMBULATORY_CARE_PROVIDER_SITE_OTHER): Payer: Medicaid Other | Admitting: Internal Medicine

## 2017-04-21 ENCOUNTER — Other Ambulatory Visit: Payer: Self-pay

## 2017-04-21 ENCOUNTER — Encounter: Payer: Self-pay | Admitting: Internal Medicine

## 2017-04-21 VITALS — BP 135/79 | HR 81 | Temp 97.8°F | Ht 63.0 in | Wt 108.1 lb

## 2017-04-21 DIAGNOSIS — F419 Anxiety disorder, unspecified: Secondary | ICD-10-CM | POA: Diagnosis not present

## 2017-04-21 DIAGNOSIS — R000 Tachycardia, unspecified: Secondary | ICD-10-CM | POA: Insufficient documentation

## 2017-04-21 DIAGNOSIS — Z79899 Other long term (current) drug therapy: Secondary | ICD-10-CM

## 2017-04-21 DIAGNOSIS — I10 Essential (primary) hypertension: Secondary | ICD-10-CM | POA: Diagnosis present

## 2017-04-21 DIAGNOSIS — G8929 Other chronic pain: Secondary | ICD-10-CM | POA: Diagnosis not present

## 2017-04-21 DIAGNOSIS — Z8679 Personal history of other diseases of the circulatory system: Secondary | ICD-10-CM

## 2017-04-21 DIAGNOSIS — M545 Low back pain: Secondary | ICD-10-CM | POA: Diagnosis not present

## 2017-04-21 DIAGNOSIS — I4711 Inappropriate sinus tachycardia, so stated: Secondary | ICD-10-CM

## 2017-04-21 DIAGNOSIS — G4700 Insomnia, unspecified: Secondary | ICD-10-CM

## 2017-04-21 DIAGNOSIS — R6889 Other general symptoms and signs: Secondary | ICD-10-CM

## 2017-04-21 HISTORY — DX: Tachycardia, unspecified: R00.0

## 2017-04-21 HISTORY — DX: Inappropriate sinus tachycardia, so stated: I47.11

## 2017-04-21 NOTE — Patient Instructions (Addendum)
It was a pleasure to meet you today [name],   For your blood pressure, please continue taking the lisinopril  You can continue taking the atenolol if you feel that it helps with your heart racing    FOLLOW-UP INSTRUCTIONS When: April 20 with Dr. Jari Favre  For: blood pressure and heart rate check  What to bring: medication bottles

## 2017-04-21 NOTE — Assessment & Plan Note (Signed)
On 1/31 she was seen in urgent care for a one day history of palpitations, this was not associated with chest pain, shortness of breath, dizziness, or syncope. Vitals revealed BP 139/71. EKG showed sinus tachycardia with upright P waves in lead I and II,  HR 119. Labs showed K 3.2, Cl 94, and normal renal function. The sinus tachycardia was thought to be related to dehydration and then she was started on atenolol. Since the time of urgent care visit her palpitations have resolved. She says that the episodes of palpitations are not associated with anxiety or worsening of her pain, she can be sitting quietly on her couch watching TV and feel the tachycardia. She would like to continue taking the atenolol to help with her symptoms and address this further at her next PCP visit.  She has no signs or symptoms of infection, anemia, or PE. The episodes of tachycardia are not associated with worsening of her anxiety or pain, GAD 7 score is 3 today. Last TSH was low normal, she endorses trouble relaxing, heat intolerance, insomnia, and leg pain but says that the leg pain and insomnia are due to her chronic back pain.  - consider retesting TSH  -  continue Atenolol 25 mg qd, could consider switching this to long acting metoprolol possibly even with instructions to use PRN when she develops the tachycardia, this could help to alleviate the long term risk of tachycardia induced myopathy

## 2017-04-21 NOTE — Progress Notes (Addendum)
CC: follow up of hypertension   HPI:  Melissa Walker is a 37 y.o. with PMH as listed below who presents for follow up of blood pressure. Please see the assessment and plans for the status of the patient chronic medical problems.    Past Medical History:  Diagnosis Date  . Abnormal uterine bleeding 02/04/2012   Occurred 01/12/12-01/15/12. Evaluated in [facility] and all labs, exam, and pelvic u/s WNL. Given rx for ibuprofen and told to follow up as otupatient.    . Cholelithiasis 10/26/2014  . Chronic back pain   . Neuromuscular disorder ([place])   . Tobacco abuse    Review of Systems:  Refer to history of present illness and assessment and plans for pertinent review of systems, all others reviewed and negative  Physical Exam:  Vitals:   04/21/17 0836  BP: 135/79  Pulse: 81  Temp: 97.8 F (36.6 C)  TempSrc: Oral  SpO2: 97%  Weight: 108 lb 1.6 oz (49 kg)  Height: 5\' 3"  (1.6 m)   General:  well appearing, no acute distress  Cardiac: regular rate and rhythm, no peripheral edema  Skin: no rashes appreciated over the exposed hands or face  Assessment & Plan:   Hypertension  Pressure was previously made managed with hydrochlorothiazide 25 mg daily.  On 1/31 she was seen in urgent care for a one day history of palpitations, she had a BP 139/71. EKG showed sinus tachycardia with HR 119. Labs showed K 3.2, Cl 94, and normal renal function. HCTZ was stopped and she was started on lisinopril 20 mg qd. She has a mild headache today but denies blurred vision, shortness of breath, or palpitation. She brings in a log of her recent blood pressures ( BP machine was previously confirmed accurate in our office ) checked her blood pressure at 4:30 last night and it was 170/104 , at 7 PM it was 150/89 with heart rate 83, she took her blood pressure medications at 9 PM and rechecked her blood pressure at midnight it was 157/96 with heart rate 84.  This morning she checked her blood pressure at 7:15 AM  it was 147/90 heart rate 81.  Comorbid conditions include tobacco use and NSAID use, previous renin/aldosterone ratio was normal. She avoids salt in her diet.  - encouraged low sodium diet and reduced NSAID use   - continue Lisinopril 20 mg qd, cautioned on the teratogenic risk of this medication, she's had a tubal ligation and uses condoms - Repeat BMP today, if potassium is normal I will restart the HCTZ in combination with lisinopril for antihypertensive effect   Addendum: potassium and chloride are within the reference range on CMP, it appears that this low potassium at urgent care was more likely an isolated event. Will start lisinopril 20 mg - HCTZ 25 mg combination pill. I left a message regarding this change on the patients identified voicemail.   Inappropriate sinus tachycardia   On 1/31 she was seen in urgent care for a one day history of palpitations, this was not associated with chest pain, shortness of breath, dizziness, or syncope. Vitals revealed BP 139/71. EKG showed sinus tachycardia with upright P waves in lead I and II,  HR 119. Labs showed K 3.2, Cl 94, and normal renal function. The sinus tachycardia was thought to be related to dehydration and then she was started on atenolol. Since the time of urgent care visit her palpitations have resolved. She says that the episodes of palpitations are not  associated with anxiety or worsening of her pain, she can be sitting quietly on her couch watching TV and feel the tachycardia. She would like to continue taking the atenolol to help with her symptoms and address this further at her next PCP visit.  She has no signs or symptoms of infection, anemia, or PE. The episodes of tachycardia are not associated with worsening of her anxiety or pain, GAD 7 score is 3 today. Last TSH was low normal, she endorses trouble relaxing, heat intolerance, insomnia, and leg pain but says that the leg pain and insomnia are due to her chronic back pain.  - consider  retesting TSH  -  continue Atenolol 25 mg qd, could consider switching this to long acting metoprolol possibly even with instructions to use PRN when she develops the tachycardia, this could help to alleviate the long term risk of tachycardia induced myopathy   See Encounters Tab for problem based charting.  Patient discussed with Dr. Angelia Mould

## 2017-04-21 NOTE — Assessment & Plan Note (Addendum)
Pressure was previously made managed with hydrochlorothiazide 25 mg daily.  On 1/31 she was seen in urgent care for a one day history of palpitations, she had a BP 139/71. EKG showed sinus tachycardia with HR 119. Labs showed K 3.2, Cl 94, and normal renal function. HCTZ was stopped and she was started on lisinopril 20 mg qd. She has a mild headache today but denies blurred vision, shortness of breath, or palpitation. She brings in a log of her recent blood pressures ( BP machine was previously confirmed accurate in our office ) checked her blood pressure at 4:30 last night and it was 170/104 , at 7 PM it was 150/89 with heart rate 83, she took her blood pressure medications at 9 PM and rechecked her blood pressure at midnight it was 157/96 with heart rate 84.  This morning she checked her blood pressure at 7:15 AM it was 147/90 heart rate 81.  Comorbid conditions include tobacco use and NSAID use, previous renin/aldosterone ratio was normal. She avoids salt in her diet.  - encouraged low sodium diet and reduced NSAID use  - continue Lisinopril 20 mg qd, cautioned on the teratogenic risk of this medication, she's had a tubal ligation and uses condoms - Repeat BMP today, if potassium is normal I will restart the HCTZ in combination with lisinopril for antihypertensive effect   Addendum: potassium and chloride are within the reference range on CMP, it appears that this low potassium at urgent care was more likely an isolated event. Will start lisinopril 20 mg - HCTZ 25 mg combination pill.

## 2017-04-22 LAB — BMP8+ANION GAP
ANION GAP: 10 mmol/L (ref 10.0–18.0)
BUN/Creatinine Ratio: 6 — ABNORMAL LOW (ref 9–23)
BUN: 5 mg/dL — ABNORMAL LOW (ref 6–20)
CALCIUM: 8.8 mg/dL (ref 8.7–10.2)
CO2: 25 mmol/L (ref 20–29)
Chloride: 105 mmol/L (ref 96–106)
Creatinine, Ser: 0.77 mg/dL (ref 0.57–1.00)
GFR, EST AFRICAN AMERICAN: 114 mL/min/{1.73_m2} (ref >59)
GFR, EST NON AFRICAN AMERICAN: 99 mL/min/{1.73_m2} (ref >59)
Glucose: 82 mg/dL (ref 65–99)
POTASSIUM: 3.9 mmol/L (ref 3.5–5.2)
Sodium: 140 mmol/L (ref 134–144)

## 2017-04-22 MED ORDER — LISINOPRIL-HYDROCHLOROTHIAZIDE 20-25 MG PO TABS: 1.0000 | ORAL_TABLET | Freq: Every day | ORAL | 1 refills | Status: DC

## 2017-04-22 NOTE — Progress Notes (Signed)
Internal Medicine Clinic Attending  Case discussed with Dr. Blum at the time of the visit.  We reviewed the resident's history and exam and pertinent patient test results.  I agree with the assessment, diagnosis, and plan of care documented in the resident's note. 

## 2017-04-22 NOTE — Addendum Note (Signed)
Addended by: Meryl Dare on: 04/22/2017 08:53 AM   Modules accepted: Orders

## 2017-04-26 ENCOUNTER — Telehealth: Payer: Self-pay

## 2017-04-26 NOTE — Telephone Encounter (Signed)
atenolol (TENORMIN) 25 MG tablet, Refill request @ [facility] on [address]

## 2017-04-27 MED ORDER — ATENOLOL 25 MG PO TABS: 12.5000 mg | ORAL_TABLET | Freq: Every day | ORAL | 1 refills | Status: DC

## 2017-05-05 ENCOUNTER — Encounter: Payer: Self-pay | Admitting: *Deleted

## 2017-06-11 ENCOUNTER — Other Ambulatory Visit: Payer: Self-pay | Admitting: Internal Medicine

## 2017-06-11 DIAGNOSIS — M5417 Radiculopathy, lumbosacral region: Secondary | ICD-10-CM

## 2017-06-15 ENCOUNTER — Other Ambulatory Visit: Payer: Self-pay | Admitting: Internal Medicine

## 2017-06-15 DIAGNOSIS — I10 Essential (primary) hypertension: Secondary | ICD-10-CM

## 2017-06-15 DIAGNOSIS — R0789 Other chest pain: Secondary | ICD-10-CM

## 2017-06-15 NOTE — Telephone Encounter (Signed)
Patient says that she had to paid 30.00 for pain medicine because no sent the preauthorize back to the pharmacy, pls contact patient

## 2017-06-17 ENCOUNTER — Other Ambulatory Visit: Payer: Self-pay | Admitting: Internal Medicine

## 2017-06-17 MED ORDER — LISINOPRIL-HYDROCHLOROTHIAZIDE 20-25 MG PO TABS: 1.0000 | ORAL_TABLET | Freq: Every day | ORAL | 1 refills | Status: DC

## 2017-06-23 ENCOUNTER — Encounter: Payer: Medicaid Other | Admitting: Internal Medicine

## 2017-07-07 ENCOUNTER — Encounter: Payer: Self-pay | Admitting: Internal Medicine

## 2017-07-07 ENCOUNTER — Ambulatory Visit: Payer: Medicaid Other | Admitting: Internal Medicine

## 2017-07-07 ENCOUNTER — Other Ambulatory Visit: Payer: Self-pay

## 2017-07-07 VITALS — BP 122/69 | HR 86 | Temp 97.9°F | Ht 63.0 in | Wt 111.0 lb

## 2017-07-07 DIAGNOSIS — M549 Dorsalgia, unspecified: Secondary | ICD-10-CM

## 2017-07-07 DIAGNOSIS — Z79891 Long term (current) use of opiate analgesic: Secondary | ICD-10-CM

## 2017-07-07 DIAGNOSIS — R000 Tachycardia, unspecified: Secondary | ICD-10-CM | POA: Diagnosis not present

## 2017-07-07 DIAGNOSIS — Z791 Long term (current) use of non-steroidal anti-inflammatories (NSAID): Secondary | ICD-10-CM

## 2017-07-07 DIAGNOSIS — Z79899 Other long term (current) drug therapy: Secondary | ICD-10-CM

## 2017-07-07 DIAGNOSIS — G8929 Other chronic pain: Secondary | ICD-10-CM | POA: Diagnosis not present

## 2017-07-07 DIAGNOSIS — I10 Essential (primary) hypertension: Secondary | ICD-10-CM

## 2017-07-07 DIAGNOSIS — F17200 Nicotine dependence, unspecified, uncomplicated: Secondary | ICD-10-CM

## 2017-07-07 DIAGNOSIS — M5417 Radiculopathy, lumbosacral region: Secondary | ICD-10-CM

## 2017-07-07 MED ORDER — CYCLOBENZAPRINE HCL 5 MG PO TABS: 5.0000 mg | ORAL_TABLET | Freq: Three times a day (TID) | ORAL | 2 refills | Status: DC | PRN

## 2017-07-07 MED ORDER — HYDROCODONE-ACETAMINOPHEN 5-325 MG PO TABS: 1.0000 | ORAL_TABLET | Freq: Three times a day (TID) | ORAL | 0 refills | Status: DC | PRN

## 2017-07-07 NOTE — Progress Notes (Signed)
CC: back pain  HPI:  [name] is a 37 y.o. with a PMH of HTN, tachycardia, chronic back pain with lumbosacral radiculopathy presenting to clinic for follow up on hypertension, and back pain.  Please see problem based Assessment and Plan for status of patients chronic conditions.  Past Medical History:  Diagnosis Date  . Abnormal uterine bleeding 02/04/2012   Occurred 01/12/12-01/15/12. Evaluated in [facility] and all labs, exam, and pelvic u/s WNL. Given rx for ibuprofen and told to follow up as otupatient.    . Cholelithiasis 10/26/2014  . Chronic back pain   . Neuromuscular disorder ([place])   . Tobacco abuse     Review of Systems:   ROS  Physical Exam:  Vitals:   07/07/17 1444  BP: 122/69  Pulse: 86  Temp: 97.9 F (36.6 C)  TempSrc: Oral  SpO2: 100%  Weight: 111 lb (50.3 kg)  Height: 5\' 3"  (1.6 m)   GENERAL- alert, co-operative, appears as stated age, not in any distress. HEENT- EOMI, oral mucosa appears moist; no lymphadenopathy or thyromegaly CARDIAC- RRR, no murmurs, rubs or gallops. RESP- Moving equal volumes of air, and clear to auscultation bilaterally, no wheezes or crackles. ABDOMEN- Soft, nontender, bowel sounds present. NEURO- strength and sensation intact EXTREMITIES- pulse 2+ radial/PT, symmetric, no pedal edema. SKIN- Warm, dry PSYCH- Normal mood and affect, appropriate thought content and speech.  Assessment & Plan:   Hypertension Patient compliant with lisinopril-hctz 20-25mg  daily and atenolol 12.5mg  daily (added on for tachycardia). She denies headaches, vision or hearing changes, chest pain, shortness of breath, further palpitations. BPs at home have been well controlled per patient; this AM reports measurement of 98/72 - w/o dizziness or lightheadedness.  Bmet to eval K since restarting HCTZ (stopped previously by UC for slightly low K of 3.2 on stat lab but recovered at last [facility] visit) - today, K is 3.8 but sodium low at 120 with Cl at 85;  renal function stable.   Plan: --attempted calling patient (unable to reach x2) to have her come in for repeat Bmet (lab) today or tomorrow if possible - messaged triage RNs for help with getting in touch with patient - standing lab order placed --will stop HCTZ and just continue lisinopril 20mg  daily and atenolol - with the addition of atenolol she may have adequate enough control of her blood pressure w/o HCTZ  --limit fluid intake to less than 60oz per day --based on repeat labs will decide on further medication adjustments and office visits  Inappropriate sinus tachycardia Tachycardia well controlled with atenolol 12.5mg  daily. Patient denies palpitations since starting atenolol. She denies chest pain, shortness of breath, LE swelling, headaches, diarrhea. Still uncertain as to the cause of her tachycardia. No other s/sx of catecholamine excess - her BP is well controlled and she has factors that contributed to onset of HTN (smoking, chronic NSAID use, high salt diet, high caffeine intake).  Plan: --patient wishes to continue atenolol for now --will attempt in the future to see how she does with PRN BB like metoprolol --if HR becomes uncontrolled again, may pursue eval for catacholamine excess  Lumbosacral radiculopathy Stable w/o worsening of symptoms. Some days are better than others and today seems to be going well. She still requires same opioid use as previously; uses flexeril and duloxetine; chronic mobic from [employer], sport and exercise.  Plan: --continue current regimen  Long-term current use of opiate analgesic MARCUS GROLL is on chronic opioid therapy for chronic pain. The date of the controlled  substances contract is referenced in the [place] and / or the overview. Date of pain contract was 11/14/2015. As part of the treatment plan, the [place] controlled substance database is checked at least twice yearly and the database results are appropriate. I have last reviewed the results on 07/07/17.   The  last UDS was on 08/05/2016 and results are as expected. Patient needs at least a yearly UDS.   The patient is on hydrocodone/acetaminophen (Lorcet, Lortab, Norco, Vicodin) strength 5-325mg , #112 per 30 days. Adjunctive treatment includes Cymbalta, NSAID's and Muscle relaxants. This regimen allows DACHELLE MOLZAHN to function and does not cause excessive sedation or other side effects.  "The benefits of continuing opioid therapy outweigh the risks and chronic opioids will be continued. Ongoing education about safe opioid treatment is provided  Interventions today include: UDS and Refills - 3 paper Rx printed   See Encounters Tab for problem based charting.   Patient discussed with Dr. Verdie Drown, MD Internal Medicine PGY2

## 2017-07-07 NOTE — Patient Instructions (Signed)
I have refilled your medications.  We'll plan to see you in about 3 months, earlier if you need [place]!

## 2017-07-08 ENCOUNTER — Telehealth: Payer: Self-pay | Admitting: Internal Medicine

## 2017-07-08 ENCOUNTER — Telehealth: Payer: Self-pay | Admitting: *Deleted

## 2017-07-08 ENCOUNTER — Encounter: Payer: Self-pay | Admitting: Internal Medicine

## 2017-07-08 LAB — BMP8+ANION GAP
Anion Gap: 14 mmol/L (ref 10.0–18.0)
BUN / CREAT RATIO: 7 — AB (ref 9–23)
BUN: 6 mg/dL (ref 6–20)
CO2: 21 mmol/L (ref 20–29)
CREATININE: 0.84 mg/dL (ref 0.57–1.00)
Calcium: 9.4 mg/dL (ref 8.7–10.2)
Chloride: 85 mmol/L — ABNORMAL LOW (ref 96–106)
GFR calc non Af Amer: 89 mL/min/{1.73_m2} (ref >59)
GFR, EST AFRICAN AMERICAN: 103 mL/min/{1.73_m2} (ref >59)
Glucose: 76 mg/dL (ref 65–99)
Potassium: 3.8 mmol/L (ref 3.5–5.2)
Sodium: 120 mmol/L — ABNORMAL LOW (ref 134–144)

## 2017-07-08 LAB — CBC
HEMATOCRIT: 32.9 % — AB (ref 34.0–46.6)
Hemoglobin: 11.7 g/dL (ref 11.1–15.9)
MCH: 34.6 pg — ABNORMAL HIGH (ref 26.6–33.0)
MCHC: 35.6 g/dL (ref 31.5–35.7)
MCV: 97 fL (ref 79–97)
Platelets: 550 10*3/uL — ABNORMAL HIGH (ref 150–379)
RBC: 3.38 x10E6/uL — ABNORMAL LOW (ref 3.77–5.28)
RDW: 13 % (ref 12.3–15.4)
WBC: 9.6 10*3/uL (ref 3.4–10.8)

## 2017-07-08 NOTE — Assessment & Plan Note (Addendum)
Patient compliant with lisinopril-hctz 20-25mg  daily and atenolol 12.5mg  daily (added on for tachycardia). She denies headaches, vision or hearing changes, chest pain, shortness of breath, further palpitations. BPs at home have been well controlled per patient; this AM reports measurement of 98/72 - w/o dizziness or lightheadedness.  Bmet to eval K since restarting HCTZ (stopped previously by UC for slightly low K of 3.2 on stat lab but recovered at last [facility] visit) - today, K is 3.8 but sodium low at 120 with Cl at 85; renal function stable.   Plan: --attempted calling patient (unable to reach x2) to have her come in for repeat Bmet (lab) today or tomorrow if possible - messaged triage RNs for help with getting in touch with patient - standing lab order placed --will stop HCTZ and just continue lisinopril 20mg  daily and atenolol - with the addition of atenolol she may have adequate enough control of her blood pressure w/o HCTZ  --limit fluid intake to less than 60oz per day --based on repeat labs will decide on further medication adjustments and office visits

## 2017-07-08 NOTE — Telephone Encounter (Signed)
Patient said that someone called her

## 2017-07-08 NOTE — Assessment & Plan Note (Signed)
Stable w/o worsening of symptoms. Some days are better than others and today seems to be going well. She still requires same opioid use as previously; uses flexeril and duloxetine; chronic mobic from [employer], sport and exercise.  Plan: --continue current regimen

## 2017-07-08 NOTE — Progress Notes (Signed)
Medicine attending: Medical history, presenting problems, physical findings, and medications, reviewed with resident physician Dr Alphonzo Grieve on the day of the patient visit and I concur with her evaluation and management plan. Unexpected hyponatremia. Pt asymptomatic. Known sensitivity to HCTZ but K is normal. Suspect lab error. Will repeat. Hold HCTZ. Limit fluid intake pending repeat labs.

## 2017-07-08 NOTE — Telephone Encounter (Signed)
Call to [facility] for Hydrocodone-Acetaminophen has been approved 06/15/2017 thru 12/12/2017.  Sander Nephew, RN 07/08/2017 2:07 PM.

## 2017-07-08 NOTE — Assessment & Plan Note (Signed)
Melissa Walker is on chronic opioid therapy for chronic pain. The date of the controlled substances contract is referenced in the [place] and / or the overview. Date of pain contract was 11/14/2015. As part of the treatment plan, the [place] controlled substance database is checked at least twice yearly and the database results are appropriate. I have last reviewed the results on 07/07/17.   The last UDS was on 08/05/2016 and results are as expected. Patient needs at least a yearly UDS.   The patient is on hydrocodone/acetaminophen (Lorcet, Lortab, Norco, Vicodin) strength 5-325mg , #112 per 30 days. Adjunctive treatment includes Cymbalta, NSAID's and Muscle relaxants. This regimen allows Melissa Walker to function and does not cause excessive sedation or other side effects.  "The benefits of continuing opioid therapy outweigh the risks and chronic opioids will be continued. Ongoing education about safe opioid treatment is provided  Interventions today include: UDS and Refills - 3 paper Rx printed

## 2017-07-08 NOTE — Telephone Encounter (Signed)
Was able to reach patient and discuss lab results. She is in agreement with coming in tomorrow morning for repeat Bmet; she has extra lisinopril from previous so will take only lisinopril and atenolol and not take the lisinopril-hctz combo pill. She is also in agreement with drinking <60oz fluids per day for now.  Alphonzo Grieve, MD IMTS - PGY2

## 2017-07-08 NOTE — Assessment & Plan Note (Signed)
Tachycardia well controlled with atenolol 12.5mg  daily. Patient denies palpitations since starting atenolol. She denies chest pain, shortness of breath, LE swelling, headaches, diarrhea. Still uncertain as to the cause of her tachycardia. No other s/sx of catecholamine excess - her BP is well controlled and she has factors that contributed to onset of HTN (smoking, chronic NSAID use, high salt diet, high caffeine intake).  Plan: --patient wishes to continue atenolol for now --will attempt in the future to see how she does with PRN BB like metoprolol --if HR becomes uncontrolled again, may pursue eval for catacholamine excess

## 2017-07-09 ENCOUNTER — Telehealth: Payer: Self-pay | Admitting: *Deleted

## 2017-07-09 ENCOUNTER — Other Ambulatory Visit: Payer: Self-pay | Admitting: Internal Medicine

## 2017-07-09 ENCOUNTER — Other Ambulatory Visit (INDEPENDENT_AMBULATORY_CARE_PROVIDER_SITE_OTHER): Payer: Medicaid Other

## 2017-07-09 DIAGNOSIS — I10 Essential (primary) hypertension: Secondary | ICD-10-CM

## 2017-07-09 LAB — BASIC METABOLIC PANEL
ANION GAP: 13 (ref 5–15)
BUN: 7 mg/dL (ref 6–20)
CHLORIDE: 93 mmol/L — AB (ref 101–111)
CO2: 27 mmol/L (ref 22–32)
Calcium: 9.7 mg/dL (ref 8.9–10.3)
Creatinine, Ser: 0.89 mg/dL (ref 0.44–1.00)
GFR calc Af Amer: >60 mL/min (ref >60)
GFR calc non Af Amer: >60 mL/min (ref >60)
Glucose, Bld: 106 mg/dL — ABNORMAL HIGH (ref 65–99)
POTASSIUM: 3.5 mmol/L (ref 3.5–5.1)
SODIUM: 133 mmol/L — AB (ref 135–145)

## 2017-07-09 MED ORDER — LISINOPRIL 20 MG PO TABS: 20.0000 mg | ORAL_TABLET | Freq: Every day | ORAL | 1 refills | Status: DC

## 2017-07-09 NOTE — Telephone Encounter (Signed)
Called pt - informed Lisinopril rx has been sent to the pharmacy - her pharmacy had notified her. Informed sodium still a little low per Dr Jari Favre. Asked her to call back on Monday to schedule an appt - stated she would.

## 2017-07-09 NOTE — Telephone Encounter (Signed)
Melissa Walker is here for BMET and has her medications. She has Atenolol , HCTZ , and Lisinopril/HCTZ. States she has been taking Atenolol and Lisinopril/HCTZ. Instructed to stop taking Lisinopril/HCTZ as noted from Dr Winfred Burn previous telephone note. States she does not have a rx for Lisinopril; please send one to [facility]. Thanks

## 2017-07-09 NOTE — Telephone Encounter (Signed)
Lisinopril 20mg  sent to her pharmacy.  Please let her know and have her schedule appt in clinic next week. Her labwork is better today though her sodium is still a little low.   Thank you! - Estill Dooms

## 2017-07-09 NOTE — Telephone Encounter (Signed)
Called pt - no answer;left message to give [place] a call back lab result / scheduling an appt.

## 2017-07-14 ENCOUNTER — Encounter: Payer: Self-pay | Admitting: Internal Medicine

## 2017-07-14 ENCOUNTER — Other Ambulatory Visit: Payer: Self-pay

## 2017-07-14 ENCOUNTER — Ambulatory Visit: Payer: Medicaid Other | Admitting: Internal Medicine

## 2017-07-14 VITALS — BP 116/61 | HR 95 | Temp 97.7°F | Ht 63.0 in | Wt 109.0 lb

## 2017-07-14 DIAGNOSIS — M545 Low back pain: Secondary | ICD-10-CM

## 2017-07-14 DIAGNOSIS — G8929 Other chronic pain: Secondary | ICD-10-CM | POA: Diagnosis not present

## 2017-07-14 DIAGNOSIS — I10 Essential (primary) hypertension: Secondary | ICD-10-CM

## 2017-07-14 DIAGNOSIS — E871 Hypo-osmolality and hyponatremia: Secondary | ICD-10-CM

## 2017-07-14 DIAGNOSIS — M5417 Radiculopathy, lumbosacral region: Secondary | ICD-10-CM

## 2017-07-14 DIAGNOSIS — Z79899 Other long term (current) drug therapy: Secondary | ICD-10-CM | POA: Diagnosis not present

## 2017-07-14 DIAGNOSIS — Z79891 Long term (current) use of opiate analgesic: Secondary | ICD-10-CM | POA: Diagnosis not present

## 2017-07-14 LAB — TOXASSURE SELECT,+ANTIDEPR,UR

## 2017-07-14 NOTE — Assessment & Plan Note (Addendum)
HTN well controlled on new regimen. Asymptomatic. Hyponatremia back to 133 after one day of holding HCTZ - initial low was likely lab error, however will be cautious about prescribing diuretic in future as she still had some mild hyponatremia.  Plan: --continue lisinopril 20mg  daily and atenolol 12.5mg  daily --f/u in 3 months

## 2017-07-14 NOTE — Assessment & Plan Note (Addendum)
Patient with inconsistent Utox last visit which was positive for tramadol which she states is from old prescription; she also noted that she had been out of norco for about 2 weeks prior to UDS due to running out and change of appt (though refill noted on [place] controlled database on 06/14/17). She denies taking more of her medicine than previously reported.   We discussed the inconsistencies though the report of refill may be discordant. We discussed that if any other irregularities were to occur that would breach our contract, we would move to start taper of norco and/or refer her to pain clinic if she wished.   Patient in agreement with this and stated understanding.

## 2017-07-14 NOTE — Patient Instructions (Signed)
Continue your lisinopril 20mg  daily and atenolol 12.5mg  daily. We'll see you in 3 months!

## 2017-07-14 NOTE — Progress Notes (Signed)
   CC: hypertension  HPI:  Ms.Chaundra Jerilynn Mages Gutknecht is a 37 y.o. with a PMH of HTN, tachycardia, chronic back pain with lumbosacral radiculopathy presenting to clinic for follow up on hypertension and hyponatremia.  Please see problem based Assessment and Plan for status of patients chronic conditions.  Past Medical History:  Diagnosis Date  . Abnormal uterine bleeding 02/04/2012   Occurred 01/12/12-01/15/12. Evaluated in [facility]U and all labs, exam, and pelvic u/s WNL. Given rx for ibuprofen and told to follow up as otupatient.    . Cholelithiasis 10/26/2014  . Chronic back pain   . Neuromuscular disorder ([place])   . Tobacco abuse     Review of Systems:   ROS  Denies chest pain, shortness of breath, headache, vision or hearing changes, nausea, vomiting, abd pain, dizziness/lightheadedness.   Physical Exam:  Vitals:   07/14/17 1322  BP: 116/61  Pulse: 95  Temp: 97.7 F (36.5 C)  TempSrc: Oral  SpO2: 100%  Weight: 109 lb (49.4 kg)  Height: 5\' 3"  (1.6 m)   GENERAL- alert, co-operative, appears as stated age, not in any distress. HEENT- EOMI, oral mucosa appears moist CARDIAC- RRR, no murmurs, rubs or gallops. RESP- Moving equal volumes of air, and clear to auscultation bilaterally, no wheezes or crackles. ABDOMEN- Soft, nontender, bowel sounds present. NEURO- No obvious Cr N abnormality. EXTREMITIES- pulse 2+, symmetric, no pedal edema. SKIN- Warm, dry, no rash or lesion. PSYCH- Normal mood and affect, appropriate thought content and speech.  Assessment & Plan:   See Encounters Tab for problem based charting.   Patient discussed with Dr. Abelardo Diesel, MD Internal Medicine PGY2

## 2017-07-15 NOTE — Progress Notes (Signed)
Internal Medicine Clinic Attending  Case discussed with Dr. Svalina  at the time of the visit.  We reviewed the resident's history and exam and pertinent patient test results.  I agree with the assessment, diagnosis, and plan of care documented in the resident's note.  

## 2017-07-29 ENCOUNTER — Telehealth: Payer: Self-pay | Admitting: *Deleted

## 2017-07-29 NOTE — Telephone Encounter (Signed)
Patient called in c/o bilat ankle swelling x 2 days. Denies SHOB, CP, headache. States she elevates legs whenever she is sitting. Offered ACC appt tomorrow. Patient prefers to wait till Monday. ACC appt given 08/02/2017 at 0915. Patient cautioned to head to ED if develops SHOB, CP. States understanding. Hubbard Hartshorn, RN, BSN

## 2017-07-29 NOTE — Telephone Encounter (Signed)
Thank you!  Melissa Walker 

## 2017-08-02 ENCOUNTER — Ambulatory Visit: Payer: Medicaid Other | Admitting: Internal Medicine

## 2017-08-02 ENCOUNTER — Encounter: Payer: Self-pay | Admitting: Internal Medicine

## 2017-08-02 ENCOUNTER — Other Ambulatory Visit: Payer: Self-pay

## 2017-08-02 DIAGNOSIS — M25475 Effusion, left foot: Secondary | ICD-10-CM | POA: Insufficient documentation

## 2017-08-02 DIAGNOSIS — M549 Dorsalgia, unspecified: Secondary | ICD-10-CM | POA: Diagnosis not present

## 2017-08-02 DIAGNOSIS — M25472 Effusion, left ankle: Secondary | ICD-10-CM

## 2017-08-02 DIAGNOSIS — M25473 Effusion, unspecified ankle: Principal | ICD-10-CM

## 2017-08-02 DIAGNOSIS — M25471 Effusion, right ankle: Secondary | ICD-10-CM | POA: Diagnosis not present

## 2017-08-02 DIAGNOSIS — G8929 Other chronic pain: Secondary | ICD-10-CM | POA: Diagnosis not present

## 2017-08-02 DIAGNOSIS — M25476 Effusion, unspecified foot: Principal | ICD-10-CM

## 2017-08-02 NOTE — Progress Notes (Signed)
   CC: Bilateral ankle swelling  HPI:  Melissa Walker is a 37 y.o. female with a past medical history listed below here today with complaints of bilateral ankle swelling.   Past Medical History:  Diagnosis Date  . Abnormal uterine bleeding 02/04/2012   Occurred 01/12/12-01/15/12. Evaluated in [facility]U and all labs, exam, and pelvic u/s WNL. Given rx for ibuprofen and told to follow up as otupatient.    . Cholelithiasis 10/26/2014  . Chronic back pain   . Neuromuscular disorder ([place])   . Tobacco abuse    Review of Systems:   No chest pain, shortness of breath, new cough  Physical Exam:  Vitals:   08/02/17 0915  BP: (!) 143/84  Pulse: 86  Temp: 97.7 F (36.5 C)  TempSrc: Oral  SpO2: 96%  Weight: 115 lb 4.8 oz (52.3 kg)  Height: 5\' 3"  (1.6 m)   GENERAL- alert, co-operative, appears as stated age, not in any distress. CARDIAC- RRR, no murmurs, rubs or gallops. RESP- Moving equal volumes of air, and clear to auscultation bilaterally, no wheezes or crackles.Marland Kitchen NEURO-intact sensation throughout EXTREMITIES- pulse 2+, symmetric, trace edema to mid shin on left lower extremity, no edema appreciated on right ankle or foot.  Full range of passive and active movement in both ankles.  Normal gait.  Minimal tenderness over medial malleolus. SKIN- Warm, dry, No rash or lesion.   Assessment & Plan:   See Encounters Tab for problem based charting.  Patient discussed with Dr. Lynnae January

## 2017-08-02 NOTE — Progress Notes (Signed)
Internal Medicine Clinic Attending  Case discussed with Dr. Boswell at the time of the visit.  We reviewed the resident's history and exam and pertinent patient test results.  I agree with the assessment, diagnosis, and plan of care documented in the resident's note.  

## 2017-08-02 NOTE — Assessment & Plan Note (Signed)
Melissa Walker presents with complaints of bilateral ankle swelling for the past week.  She reports that last week she was outside playing with her child when she suffered a fall.  She reports falling on the lateral aspect of her left knee and twisting so that she did not fall on her back (he has chronic back pain).  She reports that after sustaining a fall she noticed swelling and paim  in her left ankle as well as swelling in her right ankle not any pain.  The swelling has been persistent since that time.  She does report some improvement with heat as well as NSAIDs.   Trace edema present on left lower extremity with minimal tenderness over medial malleolus.  Neurovascularly intact with full range of motion both active and passively of bilateral ankles.  She is able to walk without issue.  No calf pain.  Assessment and plan:  Patient with posttraumatic inflammation swelling in her left ankle.  No evidence of any fracture.  She has no swelling in her right ankle today.  She is on any medications that would explain the swelling.  We will continue with conservative therapy and NSAIDs and heat.  If no improvement in the next 2 to 3 weeks will have patient return for follow-up.

## 2017-08-02 NOTE — Patient Instructions (Addendum)
Melissa Walker,  I am sorry to hear about your fall. Your ankle swelling and pain should continue to improve over the next several weeks. Continue to use heat as able and you can take NSAIDs to help with the inflammation and pain.   If you do not see any improvement in the next 2-3 weeks please come back and see [place].   Please schedule a visit with Dr.Svalina in August or September.

## 2017-08-15 ENCOUNTER — Other Ambulatory Visit: Payer: Self-pay | Admitting: Internal Medicine

## 2017-08-30 ENCOUNTER — Other Ambulatory Visit: Payer: Self-pay | Admitting: Internal Medicine

## 2017-08-30 DIAGNOSIS — I10 Essential (primary) hypertension: Secondary | ICD-10-CM

## 2017-09-12 ENCOUNTER — Other Ambulatory Visit: Payer: Self-pay | Admitting: Internal Medicine

## 2017-10-04 ENCOUNTER — Other Ambulatory Visit: Payer: Self-pay | Admitting: Internal Medicine

## 2017-10-04 DIAGNOSIS — M5417 Radiculopathy, lumbosacral region: Secondary | ICD-10-CM

## 2017-10-05 NOTE — Telephone Encounter (Signed)
Next appt scheduled 9/25 with PCP. 

## 2017-10-12 ENCOUNTER — Other Ambulatory Visit: Payer: Self-pay | Admitting: Internal Medicine

## 2017-11-24 ENCOUNTER — Ambulatory Visit: Payer: Medicaid Other | Admitting: Internal Medicine

## 2017-11-24 ENCOUNTER — Encounter: Payer: Self-pay | Admitting: Internal Medicine

## 2017-11-24 ENCOUNTER — Encounter (INDEPENDENT_AMBULATORY_CARE_PROVIDER_SITE_OTHER): Payer: Self-pay

## 2017-11-24 ENCOUNTER — Other Ambulatory Visit: Payer: Self-pay

## 2017-11-24 VITALS — BP 132/85 | HR 88 | Temp 98.1°F | Ht 63.0 in | Wt 113.8 lb

## 2017-11-24 DIAGNOSIS — M5417 Radiculopathy, lumbosacral region: Secondary | ICD-10-CM | POA: Diagnosis not present

## 2017-11-24 DIAGNOSIS — R000 Tachycardia, unspecified: Secondary | ICD-10-CM

## 2017-11-24 DIAGNOSIS — G8929 Other chronic pain: Secondary | ICD-10-CM

## 2017-11-24 DIAGNOSIS — I10 Essential (primary) hypertension: Secondary | ICD-10-CM

## 2017-11-24 DIAGNOSIS — F17200 Nicotine dependence, unspecified, uncomplicated: Secondary | ICD-10-CM

## 2017-11-24 DIAGNOSIS — Z79899 Other long term (current) drug therapy: Secondary | ICD-10-CM | POA: Diagnosis not present

## 2017-11-24 DIAGNOSIS — Z79891 Long term (current) use of opiate analgesic: Secondary | ICD-10-CM | POA: Diagnosis not present

## 2017-11-24 DIAGNOSIS — F17210 Nicotine dependence, cigarettes, uncomplicated: Secondary | ICD-10-CM | POA: Diagnosis not present

## 2017-11-24 DIAGNOSIS — M545 Low back pain: Secondary | ICD-10-CM | POA: Diagnosis not present

## 2017-11-24 MED ORDER — LISINOPRIL 20 MG PO TABS: ORAL_TABLET | ORAL | 3 refills | Status: DC

## 2017-11-24 MED ORDER — HYDROCODONE-ACETAMINOPHEN 5-325 MG PO TABS: 1.0000 | ORAL_TABLET | Freq: Three times a day (TID) | ORAL | 0 refills | Status: DC | PRN

## 2017-11-24 NOTE — Patient Instructions (Signed)
For your pain, take hydrocodone only if your pain is severe; try using once a day but if you need it more often, can take one up to every 8 hours.

## 2017-11-24 NOTE — Progress Notes (Signed)
   CC: back pain  HPI:  [name] is a 37 y.o. with a PMH of HTN, tachycardia, chronic back pain with lumbosacral radiculopathy presenting to clinic for follow up on back pain.  Patient states she ran out of hydrocodone-apap 1 month ago and was unable to make appointment with me during that time (which is unfortunate since I was in clinic the whole month). She states prior to running out she was taking ~3 pills per day. She denies having gone through withdrawals; she endorses worsened control of her chronic back pain with right leg radiation. She has been treating with tylenol at home with mild improvement in pain but not as much as she received with hydrocodone-apap. She notes decrease in ability to complete house-work, play with her kids, etc due to the increase in pain. She denies bowel or bladder incontinence or recent trauma.   Patient denies significant EtOH use and denies illicit drug use including old prescriptions.   Further, she denies chest pain, shortness of breath, nausea, vomiting.  Please see problem based Assessment and Plan for status of patients chronic conditions.  Past Medical History:  Diagnosis Date  . Abnormal uterine bleeding 02/04/2012   Occurred 01/12/12-01/15/12. Evaluated in [facility]U and all labs, exam, and pelvic u/s WNL. Given rx for ibuprofen and told to follow up as otupatient.    . Cholelithiasis 10/26/2014  . Chronic back pain   . Neuromuscular disorder ([place])   . Tobacco abuse     Review of Systems:   Per HPI  Physical Exam:  Vitals:   11/24/17 1449  BP: 132/85  Pulse: 88  Temp: 98.1 F (36.7 C)  TempSrc: Oral  SpO2: 99%  Weight: 113 lb 12.8 oz (51.6 kg)  Height: 5\' 3"  (1.6 m)   GENERAL- alert, co-operative, appears as stated age, not in any distress. CARDIAC- RRR, no murmurs, rubs or gallops. RESP- Moving equal volumes of air, and clear to auscultation bilaterally, no wheezes or crackles. ABDOMEN- Soft, nontender, bowel sounds  present. NEURO- CN 2-12 grossly intact. Sensation intact in bil LEs EXTREMITIES- pulse 2+ PT, symmetric, no pedal edema. Strength intact in bil LEs; no pain with palpation of spine, SI joints or paraspinal musculature. SKIN- Warm, dry PSYCH- Normal mood and affect, appropriate thought content and speech.  Assessment & Plan:   See Encounters Tab for problem based charting.   Patient discussed with Dr. Gerrit Friends, MD Internal Medicine PGY-3

## 2017-11-26 NOTE — Assessment & Plan Note (Signed)
Well controlled; asymptomatic.  Plan: --continue lisinopril 20mg  daily and atenolol 12.5mg  daily

## 2017-11-26 NOTE — Assessment & Plan Note (Signed)
Patient with worsened control of her pain since running out of hydrocodone-apap, with insufficient relief of pain with tylenol alone in addition to duloxetine, flexeril and meloxicam.  Plan: --restart norco 5-325mg ; advised to start with only once a day if needed and try to limit to twice a day if necessary. Will given #90 pills and f/u in 6 wks (next available appt). She will f/u with her neurosurgeon in that period of time.

## 2017-11-26 NOTE — Assessment & Plan Note (Signed)
Patient continues to smoke 1ppd. She is not ready to quit yet. Advised that we have supportive options for her when she decides to quit.

## 2017-11-26 NOTE — Assessment & Plan Note (Signed)
Melissa Walker is on chronic opioid therapy for chronic pain. The date of the controlled substances contract is referenced in the [place] and / or the overview. Date of pain contract was 11/14/15. As part of the treatment plan, the [place] controlled substance database is checked at least twice yearly and the database results are appropriate. I have last reviewed the results on 11/24/17.   The last UDS was on 06/2017 and results are not as expected based on refill history but consistent with what patient reported. Patient needs at least a yearly UDS.   The patient is on hydrocodone/acetaminophen (Lorcet, Lortab, Norco, Vicodin) strength 5-325mg , 112 per 30 days. Adjunctive treatment includes Cymbalta, NSAID's and Muscle relaxants. This regimen allows Melissa Walker to function and does not cause excessive sedation or other side effects.  "The benefits of continuing opioid therapy outweigh the risks and chronic opioids will be continued. Ongoing education about safe opioid treatment is provided  Interventions today include: Since patient has been off of opioids for 1 month and reported a definite benefit in function while on them will do trial of lower frequency norco.  --restart norco 5-325mg ; advised to start with only once a day if needed and try to limit to twice a day if necessary. Will given #90 pills and f/u in 6 wks (next available appt). She will f/u with her neurosurgeon in that period of time. --UDS today

## 2017-11-27 NOTE — Progress Notes (Signed)
Internal Medicine Clinic Attending  Case discussed with Dr. Svalina  at the time of the visit.  We reviewed the resident's history and exam and pertinent patient test results.  I agree with the assessment, diagnosis, and plan of care documented in the resident's note.  

## 2017-12-01 LAB — TOXASSURE SELECT,+ANTIDEPR,UR

## 2017-12-16 ENCOUNTER — Telehealth: Payer: Self-pay | Admitting: Internal Medicine

## 2017-12-31 ENCOUNTER — Other Ambulatory Visit: Payer: Self-pay | Admitting: Internal Medicine

## 2017-12-31 DIAGNOSIS — M5417 Radiculopathy, lumbosacral region: Secondary | ICD-10-CM

## 2017-12-31 NOTE — Telephone Encounter (Signed)
Needs refills on HYDROcodone-acetaminophen (NORCO) 5-325 MG tablet at [facility] drug store on [address] ; pt contact [phone]  Pt also wanted to let physician that the 90 supply didn't work like the 102 supply, she would like to go back to that

## 2017-12-31 NOTE — Telephone Encounter (Signed)
Last rx written 11/24/17. Last OV 11/24/17. Next OV 01/19/18. UDS 11/24/17.  Requesting qty # 112 instead of 90. Thanks

## 2018-01-03 NOTE — Telephone Encounter (Signed)
Pt informed of dr svalina's decision, she states she will come to her appt

## 2018-01-03 NOTE — Telephone Encounter (Signed)
Refill declined as at our last visit we discussed a trial of restarting hydrocodone with #90 pill prescription with reassessment at next visit which will be 11/20.   Alphonzo Grieve, MD IMTS - PGY3

## 2018-01-10 ENCOUNTER — Other Ambulatory Visit: Payer: Self-pay | Admitting: Internal Medicine

## 2018-01-11 DIAGNOSIS — M546 Pain in thoracic spine: Secondary | ICD-10-CM | POA: Diagnosis not present

## 2018-01-11 DIAGNOSIS — M545 Low back pain: Secondary | ICD-10-CM | POA: Diagnosis not present

## 2018-01-11 DIAGNOSIS — M47816 Spondylosis without myelopathy or radiculopathy, lumbar region: Secondary | ICD-10-CM | POA: Diagnosis not present

## 2018-01-11 DIAGNOSIS — Z9889 Other specified postprocedural states: Secondary | ICD-10-CM | POA: Diagnosis not present

## 2018-01-11 DIAGNOSIS — M5136 Other intervertebral disc degeneration, lumbar region: Secondary | ICD-10-CM | POA: Diagnosis not present

## 2018-01-13 ENCOUNTER — Other Ambulatory Visit: Payer: Self-pay | Admitting: Internal Medicine

## 2018-01-13 DIAGNOSIS — M5417 Radiculopathy, lumbosacral region: Secondary | ICD-10-CM

## 2018-01-19 ENCOUNTER — Encounter (INDEPENDENT_AMBULATORY_CARE_PROVIDER_SITE_OTHER): Payer: Self-pay

## 2018-01-19 ENCOUNTER — Other Ambulatory Visit: Payer: Self-pay

## 2018-01-19 ENCOUNTER — Ambulatory Visit: Payer: Medicaid Other | Admitting: Internal Medicine

## 2018-01-19 ENCOUNTER — Encounter: Payer: Self-pay | Admitting: Internal Medicine

## 2018-01-19 VITALS — BP 133/76 | HR 81 | Temp 97.6°F | Ht 63.0 in | Wt 117.3 lb

## 2018-01-19 DIAGNOSIS — I10 Essential (primary) hypertension: Secondary | ICD-10-CM | POA: Diagnosis not present

## 2018-01-19 DIAGNOSIS — M545 Low back pain: Secondary | ICD-10-CM

## 2018-01-19 DIAGNOSIS — Z79891 Long term (current) use of opiate analgesic: Secondary | ICD-10-CM | POA: Diagnosis not present

## 2018-01-19 DIAGNOSIS — G8929 Other chronic pain: Secondary | ICD-10-CM | POA: Diagnosis not present

## 2018-01-19 DIAGNOSIS — Z79899 Other long term (current) drug therapy: Secondary | ICD-10-CM

## 2018-01-19 DIAGNOSIS — M5417 Radiculopathy, lumbosacral region: Secondary | ICD-10-CM

## 2018-01-19 MED ORDER — DULOXETINE HCL 60 MG PO CPEP: 60.0000 mg | ORAL_CAPSULE | Freq: Every day | ORAL | 5 refills | Status: DC

## 2018-01-19 NOTE — Progress Notes (Signed)
   CC: Chronic pain  HPI:  Melissa Walker is a 37 y.o. with a PMH of HTN, tachycardia, chronic back pain with lumbosacral radiculopathy presenting to clinic for follow up on back pain.  Chronic pain 2/2 lumbosacral disease: Patient had previously been on hydrocodone-apap 5-325mg  #102/month. She had refill inconsistency 07/14/17 visit; at last visit 9/25 (one month lapse for visit and refill) we restarted hydrocodone-apap 5-325 and she was given #90 for 6wks (no earlier f/u with me available). Patient states that she had to restart taking TID dosing due to pain and ran out of medicine one month ago; she has been supplementing with tylenol without much relief. She still uses heat at night which allows her to sleep through the night; she has continued duloxetine therapy as well as mobic per neurosurgery. She feels her pain has been worsening, is now in bilateral hips and notes right leg weakness; she denies further numbness/tingling in RLE, falls, loss of balance, loss of bowel or bladder control. She followed up with neurosurgery where she had an xray reveal possible worsening degenerative changes; she will f/u again in December after an MRI is obtained to see if she will need surgery again.   Please see problem based Assessment and Plan for status of patients chronic conditions.  Past Medical History:  Diagnosis Date  . Abnormal uterine bleeding 02/04/2012   Occurred 01/12/12-01/15/12. Evaluated in [facility]U and all labs, exam, and pelvic u/s WNL. Given rx for ibuprofen and told to follow up as otupatient.    . Cholelithiasis 10/26/2014  . Chronic back pain   . Neuromuscular disorder ([place])   . Tobacco abuse     Review of Systems:   Per HPI  Physical Exam:  Vitals:   01/19/18 1523  BP: 133/76  Pulse: 81  Temp: 97.6 F (36.4 C)  TempSrc: Oral  SpO2: 100%  Weight: 117 lb 4.8 oz (53.2 kg)  Height: 5\' 3"  (1.6 m)   GENERAL- alert, co-operative, appears as stated age, not in any  distress. CARDIAC- RRR, no murmurs, rubs or gallops. RESP- Moving equal volumes of air, and clear to auscultation bilaterally, no wheezes or crackles. NEURO- Sensation intact in bil LEs, 4+/5 weakness with RLE hip flexion and knee extension, RLE dorsi and plantar flexion 5/5. LLE strength 5/5 throughout. Gait unremarkable. EXTREMITIES- pulse 2+ PT, symmetric, no pedal edema.  Assessment & Plan:   See Encounters Tab for problem based charting.   Patient discussed with Dr. Gerrit Friends, MD Internal Medicine PGY-3

## 2018-01-20 ENCOUNTER — Encounter: Payer: Self-pay | Admitting: Internal Medicine

## 2018-01-20 NOTE — Assessment & Plan Note (Addendum)
Patient s/p L5-S1 laminectomy and microdisectomy 07/2014 with Dr. Sherwood Gambler. She reports worsening pain and function; has appt with neurosugery in Dec for MRI to determine if she needs another surgery.   Plan: --Cont mobic per neurosurgery --Continue duloxetine 60mg  daily --F/u UDS, if appropriate will restart hydrocodone due to current workup for worsening symptoms  **Addendum: ToxAssure appropriate. Will send in 2 month script for hydrocodone-apap 5-325mg  #90 with plan to see her in January prior to any other refills.

## 2018-01-20 NOTE — Assessment & Plan Note (Addendum)
Patient with chronic pain due to lumbar disc disease s/p surgery; has been on pain contract with me for hydrocodone-apap 5-325mg  #102 per month with red flags of inappropriate UDS in May 2019, inconsistent follow up, and using more than prescribed (has worsening pain which is being followed up with neurosurgery). Please see HPI for further details.  With the above mentioned red flags, the overall goal for opioid therapy will be tapering. In setting of worsening symptoms and workup with MRI, will hold off on more regimented taper off until more information is available from neurosurgery. Controlled substances database checked 01/19/18 is appropriate.  For now, will get UDS (patient states has been off of hydrocodone about 1 month). If appropriate, will send in hydrocodone-apap 5-325 #90 per month until I follow up with her in January; hopefully we will have more information at that time.   **Addendum: ToxAssure appropriate. Will send in 2 month script for hydrocodone-apap 5-325mg  #90 with plan to see her in January prior to any other refills.

## 2018-01-24 LAB — TOXASSURE SELECT,+ANTIDEPR,UR

## 2018-01-25 MED ORDER — HYDROCODONE-ACETAMINOPHEN 5-325 MG PO TABS: 1.0000 | ORAL_TABLET | Freq: Three times a day (TID) | ORAL | 0 refills | Status: DC | PRN

## 2018-01-25 NOTE — Addendum Note (Signed)
Addended by: Alphonzo Grieve on: 01/25/2018 03:02 PM   Modules accepted: Orders

## 2018-01-26 ENCOUNTER — Telehealth: Payer: Self-pay | Admitting: *Deleted

## 2018-01-26 NOTE — Telephone Encounter (Addendum)
Information faxed to [place] for Prior Authorization for Hydrocodone Acetaminophen 5/325.  Awaiting decision.  Sander Nephew, RN 01/26/2018 9:39 AM.  Call to Sinclair Grooms PA for Hydrocodone-Acetaminophen was approved 01/26/2018 thru 07/25/2018.  I- [id number]  [id number].  Sander Nephew, RN 02/02/2018 10:40 AM.

## 2018-01-26 NOTE — Progress Notes (Signed)
Internal Medicine Clinic Attending  Case discussed with Dr. Svalina  at the time of the visit.  We reviewed the resident's history and exam and pertinent patient test results.  I agree with the assessment, diagnosis, and plan of care documented in the resident's note.  

## 2018-02-13 NOTE — Telephone Encounter (Signed)
   Reason for call:   I received a call from [name] at 7:56 PM indicating that she has a cough.   Pertinent Data:   She mentioned that she has a cough that started one week ago, constantly present and with every breath that she takes.   She has taken [employer] children's cough medication which has not given her any relief   Household members have also been sick  Has not had her flu shot this year   Denies fever, chills, sweating,pnd, sneezing, muscle aches, headaches, conjunctival injection, or new rashes   Slight difficulty breathing and chest discomfort when coughing  She feels that she coughs to the point of gagging.   Feels that her throat is raw   Assessment / Plan / Recommendations:   Patient likely has a viral upper respiratory infection, but should get a physical exam done in person tomorrow. I instructed the patient to take over the counter medication that has guaifenesin in it and told her to follow up in [facility] tomorrow morning 12/16.  As always, pt is advised that if symptoms worsen or new symptoms arise, they should go to an urgent care facility or to to ER for further evaluation.   Lars Mage, MD   02/13/2018, 7:56 PM

## 2018-02-14 ENCOUNTER — Other Ambulatory Visit: Payer: Self-pay | Admitting: Internal Medicine

## 2018-02-14 ENCOUNTER — Encounter (HOSPITAL_COMMUNITY): Payer: Self-pay

## 2018-02-14 ENCOUNTER — Ambulatory Visit (HOSPITAL_COMMUNITY)
Admission: EM | Admit: 2018-02-14 | Discharge: 2018-02-14 | Disposition: A | Discharge status: Home / Self Care | Payer: Medicaid Other | Attending: Internal Medicine | Admitting: Internal Medicine

## 2018-02-14 DIAGNOSIS — F17210 Nicotine dependence, cigarettes, uncomplicated: Secondary | ICD-10-CM | POA: Diagnosis not present

## 2018-02-14 DIAGNOSIS — Z791 Long term (current) use of non-steroidal anti-inflammatories (NSAID): Secondary | ICD-10-CM | POA: Insufficient documentation

## 2018-02-14 DIAGNOSIS — J209 Acute bronchitis, unspecified: Secondary | ICD-10-CM | POA: Diagnosis not present

## 2018-02-14 DIAGNOSIS — R05 Cough: Secondary | ICD-10-CM | POA: Diagnosis present

## 2018-02-14 DIAGNOSIS — M5417 Radiculopathy, lumbosacral region: Secondary | ICD-10-CM

## 2018-02-14 DIAGNOSIS — Z880 Allergy status to penicillin: Secondary | ICD-10-CM | POA: Insufficient documentation

## 2018-02-14 DIAGNOSIS — R059 Cough, unspecified: Secondary | ICD-10-CM

## 2018-02-14 DIAGNOSIS — Z79899 Other long term (current) drug therapy: Secondary | ICD-10-CM | POA: Diagnosis not present

## 2018-02-14 DIAGNOSIS — Z720 Tobacco use: Secondary | ICD-10-CM

## 2018-02-14 LAB — POCT RAPID STREP A: STREPTOCOCCUS, GROUP A SCREEN (DIRECT): NEGATIVE

## 2018-02-14 MED ORDER — PROMETHAZINE-DM 6.25-15 MG/5ML PO SYRP: 5.0000 mL | ORAL_SOLUTION | Freq: Every evening | ORAL | 0 refills | Status: DC | PRN

## 2018-02-14 MED ORDER — BENZONATATE 100 MG PO CAPS: 100.0000 mg | ORAL_CAPSULE | Freq: Three times a day (TID) | ORAL | 0 refills | Status: DC | PRN

## 2018-02-14 MED ORDER — PREDNISONE 20 MG PO TABS: ORAL_TABLET | ORAL | 0 refills | Status: DC

## 2018-02-14 NOTE — ED Provider Notes (Signed)
MRN: [id number] DOB: 06-04-1980  Subjective:   HELGA ASBURY is a 37 y.o. female presenting for 1+ week history of moderate-severe, persistent/constant dry cough with intermittent coughing fits that cause shob. Cough has also elicited throat pain. Denies fever, sinus pain, ear pain, headaches, confusion, chest pain, wheezing, n/v, abdominal pain, rashes. Smokes ~1ppd. Has been using her albuterol inhaler once per day.    No current facility-administered medications for this encounter.   Current Outpatient Medications:  .  atenolol (TENORMIN) 25 MG tablet, TAKE 1/2 TABLET(12.5 MG) BY MOUTH DAILY, Disp: 45 tablet, Rfl: 0 .  cyclobenzaprine (FLEXERIL) 5 MG tablet, TAKE 1 TABLET(5 MG) BY MOUTH THREE TIMES DAILY AS NEEDED FOR MUSCLE SPASMS, Disp: 90 tablet, Rfl: 0 .  DULoxetine (CYMBALTA) 60 MG capsule, Take 1 capsule (60 mg total) by mouth daily., Disp: 30 capsule, Rfl: 5 .  HYDROcodone-acetaminophen (NORCO) 5-325 MG tablet, Take 1 tablet by mouth every 8 (eight) hours as needed for moderate pain or severe pain., Disp: 90 tablet, Rfl: 0 .  lisinopril (PRINIVIL,ZESTRIL) 20 MG tablet, TAKE 1 TABLET(20 MG) BY MOUTH DAILY, Disp: 90 tablet, Rfl: 3 .  meloxicam (MOBIC) 7.5 MG tablet, Take 1 tablet (7.5 mg total) by mouth daily., Disp: 30 tablet, Rfl: 0 .  PROVENTIL HFA 108 (90 Base) MCG/ACT inhaler, INHALE 2 PUFFS INTO THE LUNGS EVERY 6 HOURS AS NEEDED FOR WHEEZING OR SHORTNESS OF BREATH, Disp: 6.7 g, Rfl: 0    Allergies  Allergen Reactions  . Penicillins     REACTION: uncle highly allergic so was told whole family should not take it  . Nicotine Rash    Nicotine PATCH    Past Medical History:  Diagnosis Date  . Abnormal uterine bleeding 02/04/2012   Occurred 01/12/12-01/15/12. Evaluated in [facility] and all labs, exam, and pelvic u/s WNL. Given rx for ibuprofen and told to follow up as otupatient.    . Cholelithiasis 10/26/2014  . Chronic back pain   . Neuromuscular disorder ([place])   . Tobacco  abuse      Past Surgical History:  Procedure Laterality Date  . DENTAL REHABILITATION     full set dentures  . LUMBAR LAMINECTOMY/DECOMPRESSION MICRODISCECTOMY Right 07/23/2014   Procedure: Right Lumbar five-Sacral one laminotomy and microdiskectomy;  Surgeon: Jovita Gamma, MD;  Location: [facility];  Service: Neurosurgery;  Laterality: Right;  Right Lumbar five-Sacral one laminotomy and microdiskectomy  . Multiple epidural steroid injections in back     Over past 2 years  . TUBAL LIGATION Bilateral 05/11/2013   Procedure: POST PARTUM TUBAL LIGATION;  Surgeon: Osborne Oman, MD;  Location: [facility];  Service: Gynecology;  Laterality: Bilateral;    Objective:   Vitals: BP (!) 146/80 (BP Location: Right Arm)   Pulse 97   Temp 97.9 F (36.6 C) (Oral)   Resp 16   LMP 01/18/2018   SpO2 98%   Physical Exam Constitutional:      General: She is not in acute distress.    Appearance: She is well-developed. She is not ill-appearing, toxic-appearing or diaphoretic.  HENT:     Right Ear: Tympanic membrane normal.     Left Ear: Tympanic membrane normal.     Nose: Congestion present. No rhinorrhea.     Mouth/Throat:     Pharynx: No oropharyngeal exudate or posterior oropharyngeal erythema.     Comments: Significant postnasal drainage noted.  No exudates or erythema. Eyes:     General: No scleral icterus.  Right eye: No discharge.        Left eye: No discharge.     Extraocular Movements: Extraocular movements intact.     Pupils: Pupils are equal, round, and reactive to light.  Neck:     Musculoskeletal: Normal range of motion and neck supple. No neck rigidity or muscular tenderness.  Cardiovascular:     Rate and Rhythm: Normal rate and regular rhythm.     Heart sounds: No murmur. No friction rub. No gallop.   Pulmonary:     Effort: No respiratory distress.     Breath sounds: No stridor. No wheezing, rhonchi or rales.  Lymphadenopathy:     Cervical: No cervical adenopathy.   Skin:    General: Skin is warm and dry.  Neurological:     Mental Status: She is alert and oriented to person, place, and time.  Psychiatric:        Mood and Affect: Mood normal.        Behavior: Behavior normal.        Thought Content: Thought content normal.    Results for orders placed or performed during the hospital encounter of 02/14/18 (from the past 24 hour(s))  POCT rapid strep A [facility])     Status: None   Collection Time: 02/14/18  9:42 AM  Result Value Ref Range   Streptococcus, Group A Screen (Direct) NEGATIVE NEGATIVE    Assessment and Plan :   Acute bronchitis, unspecified organism  Cough  Tobacco user  Will manage her symptoms supportively with cough medications.  Due to her extensive smoking history we will also use a prednisone course. Counseled patient on potential for adverse effects with medications prescribed today, patient verbalized understanding.  Strict ER and return-to-clinic precautions discussed, patient verbalized understanding.   Jaynee Eagles, PA-C 02/14/18 1010

## 2018-02-14 NOTE — ED Triage Notes (Signed)
Pt present Dry cough that started over a week ago. Pt states that she cough so Hard her throat is hurting and she noticed bumps on the back of her tongue.

## 2018-02-16 DIAGNOSIS — M47816 Spondylosis without myelopathy or radiculopathy, lumbar region: Secondary | ICD-10-CM | POA: Diagnosis not present

## 2018-02-16 DIAGNOSIS — Z9889 Other specified postprocedural states: Secondary | ICD-10-CM | POA: Diagnosis not present

## 2018-02-16 DIAGNOSIS — M5416 Radiculopathy, lumbar region: Secondary | ICD-10-CM | POA: Diagnosis not present

## 2018-02-16 DIAGNOSIS — M5136 Other intervertebral disc degeneration, lumbar region: Secondary | ICD-10-CM | POA: Diagnosis not present

## 2018-02-16 DIAGNOSIS — M545 Low back pain: Secondary | ICD-10-CM | POA: Diagnosis not present

## 2018-02-16 DIAGNOSIS — I10 Essential (primary) hypertension: Secondary | ICD-10-CM | POA: Diagnosis not present

## 2018-02-16 LAB — CULTURE, GROUP A STREP (THRC)

## 2018-02-17 ENCOUNTER — Telehealth (HOSPITAL_COMMUNITY): Payer: Self-pay | Admitting: Emergency Medicine

## 2018-02-17 NOTE — Telephone Encounter (Signed)
Culture is positive for non group A Strep germ.  This is a finding of uncertain significance; not the typical 'strep throat' germ.  Attempted to reach patient. No answer at this time.

## 2018-03-07 ENCOUNTER — Other Ambulatory Visit: Payer: Self-pay

## 2018-03-07 DIAGNOSIS — M5417 Radiculopathy, lumbosacral region: Secondary | ICD-10-CM

## 2018-03-07 MED ORDER — HYDROCODONE-ACETAMINOPHEN 5-325 MG PO TABS: 1.0000 | ORAL_TABLET | Freq: Three times a day (TID) | ORAL | 0 refills | Status: DC | PRN

## 2018-03-07 NOTE — Telephone Encounter (Signed)
Please have patient scheduled to see me in January. I am in [facility] all month.  Alphonzo Grieve, MD IMTS - PGY3

## 2018-03-07 NOTE — Telephone Encounter (Signed)
Please schedule pt an appt this month with Dr Jari Favre since she's in [facility]. Thanks

## 2018-03-07 NOTE — Telephone Encounter (Signed)
HYDROcodone-acetaminophen (NORCO) 5-325 MG tablet   Refill request @  [id number] - [place], [place] - [address] AT [address] OF [address] [phone] (Phone) [phone] (Fax)    

## 2018-03-14 ENCOUNTER — Encounter: Payer: Self-pay | Admitting: Internal Medicine

## 2018-03-14 DIAGNOSIS — M47816 Spondylosis without myelopathy or radiculopathy, lumbar region: Secondary | ICD-10-CM | POA: Diagnosis not present

## 2018-03-15 DIAGNOSIS — M47816 Spondylosis without myelopathy or radiculopathy, lumbar region: Secondary | ICD-10-CM | POA: Diagnosis not present

## 2018-03-15 DIAGNOSIS — M5416 Radiculopathy, lumbar region: Secondary | ICD-10-CM | POA: Diagnosis not present

## 2018-03-16 DIAGNOSIS — M5126 Other intervertebral disc displacement, lumbar region: Secondary | ICD-10-CM | POA: Diagnosis not present

## 2018-03-16 DIAGNOSIS — Z9889 Other specified postprocedural states: Secondary | ICD-10-CM | POA: Diagnosis not present

## 2018-03-16 DIAGNOSIS — M545 Low back pain: Secondary | ICD-10-CM | POA: Diagnosis not present

## 2018-03-16 DIAGNOSIS — M5136 Other intervertebral disc degeneration, lumbar region: Secondary | ICD-10-CM | POA: Diagnosis not present

## 2018-03-16 DIAGNOSIS — G8929 Other chronic pain: Secondary | ICD-10-CM | POA: Diagnosis not present

## 2018-03-16 DIAGNOSIS — M47816 Spondylosis without myelopathy or radiculopathy, lumbar region: Secondary | ICD-10-CM | POA: Diagnosis not present

## 2018-03-17 ENCOUNTER — Other Ambulatory Visit: Payer: Self-pay | Admitting: Neurosurgery

## 2018-03-17 DIAGNOSIS — M5126 Other intervertebral disc displacement, lumbar region: Secondary | ICD-10-CM

## 2018-03-21 ENCOUNTER — Other Ambulatory Visit: Payer: Self-pay | Admitting: Neurosurgery

## 2018-03-21 ENCOUNTER — Ambulatory Visit
Admission: RE | Admit: 2018-03-21 | Discharge: 2018-03-21 | Disposition: A | Discharge status: Home / Self Care | Payer: Medicaid Other | Source: Ambulatory Visit | Attending: Neurosurgery | Admitting: Neurosurgery

## 2018-03-21 DIAGNOSIS — M5126 Other intervertebral disc displacement, lumbar region: Secondary | ICD-10-CM

## 2018-03-21 DIAGNOSIS — M47817 Spondylosis without myelopathy or radiculopathy, lumbosacral region: Secondary | ICD-10-CM | POA: Diagnosis not present

## 2018-03-21 MED ORDER — METHYLPREDNISOLONE ACETATE 40 MG/ML INJ SUSP (RADIOLOG
120.0000 mg | Freq: Once | INTRAMUSCULAR | Status: AC
  Administered 2018-03-21: 120 mg via EPIDURAL

## 2018-03-21 MED ORDER — IOPAMIDOL (ISOVUE-M 200) INJECTION 41%
1.0000 mL | Freq: Once | INTRAMUSCULAR | Status: AC
  Administered 2018-03-21: 1 mL via EPIDURAL

## 2018-03-22 ENCOUNTER — Ambulatory Visit: Payer: Medicaid Other | Admitting: Internal Medicine

## 2018-03-22 ENCOUNTER — Other Ambulatory Visit: Payer: Self-pay

## 2018-03-22 VITALS — BP 154/86 | HR 92 | Temp 97.4°F | Ht 63.0 in | Wt 118.0 lb

## 2018-03-22 DIAGNOSIS — R000 Tachycardia, unspecified: Secondary | ICD-10-CM | POA: Diagnosis not present

## 2018-03-22 DIAGNOSIS — R05 Cough: Secondary | ICD-10-CM | POA: Diagnosis not present

## 2018-03-22 DIAGNOSIS — M5417 Radiculopathy, lumbosacral region: Secondary | ICD-10-CM | POA: Diagnosis not present

## 2018-03-22 DIAGNOSIS — M549 Dorsalgia, unspecified: Secondary | ICD-10-CM

## 2018-03-22 DIAGNOSIS — Z79891 Long term (current) use of opiate analgesic: Secondary | ICD-10-CM

## 2018-03-22 DIAGNOSIS — G8929 Other chronic pain: Secondary | ICD-10-CM

## 2018-03-22 DIAGNOSIS — Z79899 Other long term (current) drug therapy: Secondary | ICD-10-CM

## 2018-03-22 DIAGNOSIS — I10 Essential (primary) hypertension: Secondary | ICD-10-CM

## 2018-03-22 DIAGNOSIS — Z791 Long term (current) use of non-steroidal anti-inflammatories (NSAID): Secondary | ICD-10-CM | POA: Diagnosis not present

## 2018-03-22 DIAGNOSIS — R058 Other specified cough: Secondary | ICD-10-CM | POA: Insufficient documentation

## 2018-03-22 MED ORDER — BENZONATATE 100 MG PO CAPS: 100.0000 mg | ORAL_CAPSULE | Freq: Three times a day (TID) | ORAL | 0 refills | Status: DC | PRN

## 2018-03-22 MED ORDER — FLUTICASONE PROPIONATE 50 MCG/ACT NA SUSP: 1.0000 | Freq: Every day | NASAL | 2 refills | Status: DC

## 2018-03-22 NOTE — Assessment & Plan Note (Signed)
Patient with overall stable back pain with acute exacerbation after spinal injection yesterday. She is able to perform ADLs and play with her kids however for decreased periods of time. She has f/u with Dr. Sherwood Gambler in March to assess affect of injection and decide on surgery or not.   She is on NSAIDs, duloxetine, flexeril, and opioids. Using hydrocodone-apap 3x per day. Denies constipation.  Plan: --obtain records from Dr. Donnella Bi office --continue current regimen (patient has 1 refill of hydrocodone-apap at pharmacy) until f/u with Dr. Sherwood Gambler in March then reassess. PMP database appropriate, last fill 03/07/18 --advised that if she continues to have severe pain after her injection to alert Dr. Sherwood Gambler --advised on alarm symptoms and to present to ED if these occur

## 2018-03-22 NOTE — Progress Notes (Signed)
   CC: back pain  HPI:  Melissa Walker is a 38 y.o. with a PMH of HTN, tachycardia, chronic back pain with lumbosacral radiculopathy presenting to clinic for follow upon back pain and cough.  Cough: Patient diagnosed with bronchitis in December and treated with prednisone, cough medicine by urgent care; she has had lingering cough since then, occasionally productive of yellow sputum, as well as right ear fullness. She denies fevers, chills, lymphadenopathy, chest pain, shortness of breath, congestion, rhinorrhea.  Chronic pain 2/2 lumbosacral disease: Patient follows with Dr. Sherwood Gambler; over last few months had worsening pain which she states overall has been stable since our last visit. Received epi injection yesterday and today has had acute worsening pain related to the injection. She denies radiation of pain, numbness/tingling, bowel or bladder incontinence. She has been taking meloxicam, duloxetine, flexeril, and hydrocodone.  Please see problem based Assessment and Plan for status of patients chronic conditions.  Past Medical History:  Diagnosis Date  . Abnormal uterine bleeding 02/04/2012   Occurred 01/12/12-01/15/12. Evaluated in [facility] and all labs, exam, and pelvic u/s WNL. Given rx for ibuprofen and told to follow up as otupatient.    . Cholelithiasis 10/26/2014  . Chronic back pain   . Neuromuscular disorder ([place])   . Tobacco abuse     Review of Systems:   Per HPI  Physical Exam:  Vitals:   03/22/18 1400  BP: (!) 154/86  Pulse: 92  Temp: (!) 97.4 F (36.3 C)  TempSrc: Oral  SpO2: 99%  Weight: 118 lb (53.5 kg)  Height: 5\' 3"  (1.6 m)   GENERAL- alert, co-operative, appears as stated age, not in any distress. HEENT- oral mucosa appears moist, mild nasal congestion, TMI w/o visible effusion, no sinus tenderness CARDIAC- RRR, no murmurs, rubs or gallops. RESP- Moving equal volumes of air, and clear to auscultation bilaterally, no wheezes or crackles. NEURO-  Strength and sensation intact in bil LEs. EXTREMITIES- pulse 2+ PT, symmetric, no pedal edema. TTP over right SI joint which localizes patient's pain SKIN- Warm, dry. Small ecchymosis at site of back injection  Assessment & Plan:   See Encounters Tab for problem based charting.   Patient discussed with Dr. Abelardo Diesel, MD Internal Medicine PGY-3

## 2018-03-22 NOTE — Patient Instructions (Signed)
We'll keep the same regimen. If you are still having worsening pain after you injection in the next couple of days, please let Dr. Sherwood Gambler know. Also if you start having bowel or bladder incontinence, call our clinic or go to the emergency room.   We'll see you after your visit with Dr. Sherwood Gambler in March  If your cough is ongoing despite flonase (daily to twice daily), and tessalon perles and not getting any better in the next couple of weeks, please let us know.

## 2018-03-22 NOTE — Assessment & Plan Note (Signed)
Patient with lingering cough at times productive of yellow sputum as well as right ear fullness after respiratory infection in December. Exam reveals clear lungs, nasal congestion.  Plan: --start flonase --refill tessalon --advised to f/u if no improvement in cough or new symptoms arise

## 2018-03-23 NOTE — Progress Notes (Signed)
Internal Medicine Clinic Attending  Case discussed with Dr. Svalina  at the time of the visit.  We reviewed the resident's history and exam and pertinent patient test results.  I agree with the assessment, diagnosis, and plan of care documented in the resident's note.  

## 2018-04-11 ENCOUNTER — Telehealth: Payer: Self-pay | Admitting: Internal Medicine

## 2018-04-12 ENCOUNTER — Other Ambulatory Visit: Payer: Self-pay | Admitting: *Deleted

## 2018-04-12 DIAGNOSIS — M5417 Radiculopathy, lumbosacral region: Secondary | ICD-10-CM

## 2018-04-12 MED ORDER — HYDROCODONE-ACETAMINOPHEN 5-325 MG PO TABS: 1.0000 | ORAL_TABLET | Freq: Three times a day (TID) | ORAL | 0 refills | Status: DC | PRN

## 2018-04-12 NOTE — Telephone Encounter (Signed)
1 month refill sent which will last until her f/u with me. PMP database appropriate on check today.  Alphonzo Grieve, MD IMTS - PGY3

## 2018-04-12 NOTE — Telephone Encounter (Signed)
Needs refill on   HYDROcodone-acetaminophen  [facility]) 5-325 MG tablet    [id number] [place], [address] AT [place]; pt contact [phone]

## 2018-04-14 ENCOUNTER — Other Ambulatory Visit: Payer: Self-pay | Admitting: Neurosurgery

## 2018-04-14 DIAGNOSIS — M5416 Radiculopathy, lumbar region: Secondary | ICD-10-CM | POA: Diagnosis not present

## 2018-04-14 DIAGNOSIS — M5136 Other intervertebral disc degeneration, lumbar region: Secondary | ICD-10-CM | POA: Diagnosis not present

## 2018-04-14 DIAGNOSIS — M5440 Lumbago with sciatica, unspecified side: Secondary | ICD-10-CM | POA: Diagnosis not present

## 2018-04-14 DIAGNOSIS — Z9889 Other specified postprocedural states: Secondary | ICD-10-CM | POA: Diagnosis not present

## 2018-04-14 DIAGNOSIS — M47816 Spondylosis without myelopathy or radiculopathy, lumbar region: Secondary | ICD-10-CM | POA: Diagnosis not present

## 2018-04-14 DIAGNOSIS — M5126 Other intervertebral disc displacement, lumbar region: Secondary | ICD-10-CM | POA: Diagnosis not present

## 2018-05-03 ENCOUNTER — Emergency Department (HOSPITAL_BASED_OUTPATIENT_CLINIC_OR_DEPARTMENT_OTHER): Payer: Medicaid Other

## 2018-05-03 ENCOUNTER — Other Ambulatory Visit: Payer: Self-pay

## 2018-05-03 ENCOUNTER — Emergency Department (HOSPITAL_BASED_OUTPATIENT_CLINIC_OR_DEPARTMENT_OTHER)
Admission: EM | Admit: 2018-05-03 | Discharge: 2018-05-04 | Disposition: A | Discharge status: Home / Self Care | Payer: Medicaid Other | Attending: Emergency Medicine | Admitting: Emergency Medicine

## 2018-05-03 ENCOUNTER — Encounter (HOSPITAL_BASED_OUTPATIENT_CLINIC_OR_DEPARTMENT_OTHER): Payer: Self-pay | Admitting: Emergency Medicine

## 2018-05-03 DIAGNOSIS — Z79899 Other long term (current) drug therapy: Secondary | ICD-10-CM | POA: Insufficient documentation

## 2018-05-03 DIAGNOSIS — I10 Essential (primary) hypertension: Secondary | ICD-10-CM | POA: Diagnosis not present

## 2018-05-03 DIAGNOSIS — R1031 Right lower quadrant pain: Secondary | ICD-10-CM | POA: Diagnosis not present

## 2018-05-03 DIAGNOSIS — F17210 Nicotine dependence, cigarettes, uncomplicated: Secondary | ICD-10-CM | POA: Insufficient documentation

## 2018-05-03 DIAGNOSIS — E860 Dehydration: Secondary | ICD-10-CM | POA: Diagnosis not present

## 2018-05-03 DIAGNOSIS — K279 Peptic ulcer, site unspecified, unspecified as acute or chronic, without hemorrhage or perforation: Secondary | ICD-10-CM | POA: Diagnosis not present

## 2018-05-03 DIAGNOSIS — K8020 Calculus of gallbladder without cholecystitis without obstruction: Secondary | ICD-10-CM | POA: Diagnosis not present

## 2018-05-03 DIAGNOSIS — R1011 Right upper quadrant pain: Secondary | ICD-10-CM | POA: Insufficient documentation

## 2018-05-03 LAB — CBC WITH DIFFERENTIAL/PLATELET
Abs Immature Granulocytes: 0.04 10*3/uL (ref 0.00–0.07)
Basophils Absolute: 0 10*3/uL (ref 0.0–0.1)
Basophils Relative: 0 %
EOS ABS: 0.2 10*3/uL (ref 0.0–0.5)
Eosinophils Relative: 2 %
HCT: 33.8 % — ABNORMAL LOW (ref 36.0–46.0)
Hemoglobin: 11 g/dL — ABNORMAL LOW (ref 12.0–15.0)
Immature Granulocytes: 0 %
Lymphocytes Relative: 28 %
Lymphs Abs: 3.5 10*3/uL (ref 0.7–4.0)
MCH: 34.8 pg — AB (ref 26.0–34.0)
MCHC: 32.5 g/dL (ref 30.0–36.0)
MCV: 107 fL — ABNORMAL HIGH (ref 80.0–100.0)
Monocytes Absolute: 0.5 10*3/uL (ref 0.1–1.0)
Monocytes Relative: 4 %
Neutro Abs: 8.3 10*3/uL — ABNORMAL HIGH (ref 1.7–7.7)
Neutrophils Relative %: 66 %
Platelets: 540 10*3/uL — ABNORMAL HIGH (ref 150–400)
RBC: 3.16 MIL/uL — ABNORMAL LOW (ref 3.87–5.11)
RDW: 12.3 % (ref 11.5–15.5)
WBC: 12.5 10*3/uL — AB (ref 4.0–10.5)
nRBC: 0 % (ref 0.0–0.2)

## 2018-05-03 LAB — URINALYSIS, ROUTINE W REFLEX MICROSCOPIC
Bilirubin Urine: NEGATIVE
Glucose, UA: NEGATIVE mg/dL
Ketones, ur: NEGATIVE mg/dL
LEUKOCYTE UA: NEGATIVE
Nitrite: NEGATIVE
Protein, ur: NEGATIVE mg/dL
Specific Gravity, Urine: <1.005 — ABNORMAL LOW (ref 1.005–1.030)
pH: 6.5 (ref 5.0–8.0)

## 2018-05-03 LAB — COMPREHENSIVE METABOLIC PANEL
ALT: 8 U/L (ref 0–44)
AST: 12 U/L — ABNORMAL LOW (ref 15–41)
Albumin: 3 g/dL — ABNORMAL LOW (ref 3.5–5.0)
Alkaline Phosphatase: 77 U/L (ref 38–126)
Anion gap: 6 (ref 5–15)
BUN: 11 mg/dL (ref 6–20)
CO2: 27 mmol/L (ref 22–32)
Calcium: 9 mg/dL (ref 8.9–10.3)
Chloride: 102 mmol/L (ref 98–111)
Creatinine, Ser: 0.59 mg/dL (ref 0.44–1.00)
GFR calc non Af Amer: >60 mL/min (ref >60)
Glucose, Bld: 98 mg/dL (ref 70–99)
POTASSIUM: 3.4 mmol/L — AB (ref 3.5–5.1)
Sodium: 135 mmol/L (ref 135–145)
Total Bilirubin: 0.3 mg/dL (ref 0.3–1.2)
Total Protein: 6.8 g/dL (ref 6.5–8.1)

## 2018-05-03 LAB — LACTIC ACID, PLASMA: Lactic Acid, Venous: 1.3 mmol/L (ref 0.5–1.9)

## 2018-05-03 LAB — URINALYSIS, MICROSCOPIC (REFLEX)

## 2018-05-03 LAB — PREGNANCY, URINE: Preg Test, Ur: NEGATIVE

## 2018-05-03 LAB — LIPASE, BLOOD: Lipase: 27 U/L (ref 11–51)

## 2018-05-03 MED ORDER — SODIUM CHLORIDE 0.9 % IV SOLN
2.0000 g | Freq: Once | INTRAVENOUS | Status: AC
  Administered 2018-05-03: 2 g via INTRAVENOUS
  Filled 2018-05-03: qty 2

## 2018-05-03 MED ORDER — MORPHINE SULFATE (PF) 4 MG/ML IV SOLN
4.0000 mg | Freq: Once | INTRAVENOUS | Status: AC
  Administered 2018-05-03: 4 mg via INTRAVENOUS
  Filled 2018-05-03: qty 1

## 2018-05-03 MED ORDER — SODIUM CHLORIDE 0.9 % IV BOLUS
1000.0000 mL | Freq: Once | INTRAVENOUS | Status: AC
  Administered 2018-05-03: 1000 mL via INTRAVENOUS

## 2018-05-03 MED ORDER — IOHEXOL 300 MG/ML  SOLN
100.0000 mL | Freq: Once | INTRAMUSCULAR | Status: AC | PRN
  Administered 2018-05-03: 100 mL via INTRAVENOUS

## 2018-05-03 MED ORDER — ACETAMINOPHEN 325 MG PO TABS
650.0000 mg | ORAL_TABLET | Freq: Once | ORAL | Status: AC
  Administered 2018-05-03: 650 mg via ORAL
  Filled 2018-05-03: qty 2

## 2018-05-03 MED ORDER — CEFEPIME HCL 2 G IJ SOLR
INTRAMUSCULAR | Status: AC
  Filled 2018-05-03: qty 2

## 2018-05-03 MED ORDER — ONDANSETRON HCL 4 MG/2ML IJ SOLN
4.0000 mg | Freq: Once | INTRAMUSCULAR | Status: AC
  Administered 2018-05-03: 4 mg via INTRAVENOUS
  Filled 2018-05-03: qty 2

## 2018-05-03 NOTE — ED Provider Notes (Signed)
[place] EMERGENCY DEPARTMENT Provider Note   CSN: [id number] Arrival date & time: 05/03/18  1637    History   Chief Complaint Chief Complaint  Patient presents with  . Abdominal Pain    HPI Melissa Walker is a 38 y.o. female.    38 year old female, with a PMH of chronic back pain on meloxicam, presents with abdominal pain for 3 days.  Patient states she has had some right-sided abdominal pain for the last 3 days which is been gradually worsening.  She has been taking ibuprofen for this pain.  She states today she became very nauseous and had vomiting.  She describes the vomiting as having bright red blood in it.  She is unable to quantify the amount of blood that the vomit had in it.  She notes 2 episodes of hematemesis.  She dates associated chills.  She denies any known fevers, chest pain, shortness of breath  Abdominal Pain  Associated symptoms: chills, nausea and vomiting (hematemesis)   Associated symptoms: no chest pain, no cough, no diarrhea, no dysuria, no fever, no shortness of breath and no sore throat     Past Medical History:  Diagnosis Date  . Abnormal uterine bleeding 02/04/2012   Occurred 01/12/12-01/15/12. Evaluated in [facility] and all labs, exam, and pelvic u/s WNL. Given rx for ibuprofen and told to follow up as otupatient.    . Cholelithiasis 10/26/2014  . Chronic back pain   . Neuromuscular disorder ([place])   . Tobacco abuse     Patient Active Problem List   Diagnosis Date Noted  . Cough productive of yellow sputum 03/22/2018  . Inappropriate sinus tachycardia 04/21/2017  . Hypertension 08/10/2016  . Night sweats 03/30/2016  . Thyroid function study abnormality 03/30/2016  . Long-term current use of opiate analgesic 02/27/2016  . Adult acne 12/04/2014  . Anxiety and depression 10/26/2014  . Healthcare maintenance 10/26/2014  . Underweight 10/26/2014  . HNP (herniated nucleus pulposus), lumbar 07/23/2014  . Lumbosacral radiculopathy  04/13/2008  . TOBACCO ABUSE 02/02/2006    Past Surgical History:  Procedure Laterality Date  . DENTAL REHABILITATION     full set dentures  . LUMBAR LAMINECTOMY/DECOMPRESSION MICRODISCECTOMY Right 07/23/2014   Procedure: Right Lumbar five-Sacral one laminotomy and microdiskectomy;  Surgeon: Jovita Gamma, MD;  Location: [place] [facility];  Service: Neurosurgery;  Laterality: Right;  Right Lumbar five-Sacral one laminotomy and microdiskectomy  . Multiple epidural steroid injections in back     Over past 2 years  . TUBAL LIGATION Bilateral 05/11/2013   Procedure: POST PARTUM TUBAL LIGATION;  Surgeon: Osborne Oman, MD;  Location: [facility];  Service: Gynecology;  Laterality: Bilateral;     OB History    Gravida  2   Para  2   Term  2   Preterm      AB      Living  2     SAB      TAB      Ectopic      Multiple      Live Births  2            Home Medications    Prior to Admission medications   Medication Sig Start Date End Date Taking? Authorizing Provider  atenolol (TENORMIN) 25 MG tablet TAKE 1/2 TABLET(12.5 MG) BY MOUTH DAILY 04/11/18   Alphonzo Grieve, MD  benzonatate (TESSALON) 100 MG capsule Take 1-2 capsules (100-200 mg total) by mouth 3 (three) times daily as needed. 03/22/18  Alphonzo Grieve, MD  cyclobenzaprine (FLEXERIL) 5 MG tablet TAKE 1 TABLET(5 MG) BY MOUTH THREE TIMES DAILY AS NEEDED FOR MUSCLE SPASMS 02/15/18   Alphonzo Grieve, MD  DULoxetine (CYMBALTA) 60 MG capsule Take 1 capsule (60 mg total) by mouth daily. 01/19/18   Alphonzo Grieve, MD  fluticasone (FLONASE) 50 MCG/ACT nasal spray Place 1 spray into both nostrils daily. 03/22/18 03/22/19  Alphonzo Grieve, MD  HYDROcodone-acetaminophen (NORCO) 5-325 MG tablet Take 1 tablet by mouth every 8 (eight) hours as needed for moderate pain or severe pain. 04/12/18   Alphonzo Grieve, MD  lisinopril (PRINIVIL,ZESTRIL) 20 MG tablet TAKE 1 TABLET(20 MG) BY MOUTH DAILY 11/24/17   Alphonzo Grieve, MD  meloxicam  (MOBIC) 7.5 MG tablet Take 1 tablet (7.5 mg total) by mouth daily. 06/03/16   Rivet, Sindy Guadeloupe, MD  PROVENTIL HFA 108 (90 Base) MCG/ACT inhaler INHALE 2 PUFFS INTO THE LUNGS EVERY 6 HOURS AS NEEDED FOR WHEEZING OR SHORTNESS OF BREATH 06/17/17   Alphonzo Grieve, MD    Family History Family History  Problem Relation Age of Onset  . Cancer Father        brain tumor   . Eclampsia Maternal Grandmother   . Heart attack Maternal Grandmother   . Cancer Maternal Grandmother        lung  . Heart disease Maternal Grandmother   . Hyperlipidemia Maternal Grandmother   . Hypertension Maternal Grandmother     Social History Social History   Tobacco Use  . Smoking status: Current Every Day Smoker    Packs/day: 1.00    Years: 18.00    Pack years: 18.00    Types: Cigarettes  . Smokeless tobacco: Never Used  . Tobacco comment: sometimes less. Cutting back  Substance Use Topics  . Alcohol use: Yes    Alcohol/week: 0.0 standard drinks    Comment: occasional  . Drug use: No     Allergies   Penicillins and Nicotine   Review of Systems Review of Systems  Constitutional: Positive for chills. Negative for fever.  HENT: Negative for rhinorrhea and sore throat.   Eyes: Negative for visual disturbance.  Respiratory: Negative for cough and shortness of breath.   Cardiovascular: Negative for chest pain and leg swelling.  Gastrointestinal: Positive for abdominal pain, nausea and vomiting (hematemesis). Negative for blood in stool and diarrhea.  Genitourinary: Negative for dysuria, frequency and urgency.  Skin: Negative for rash and wound.  All other systems reviewed and are negative.    Physical Exam Updated Vital Signs BP 136/63 (BP Location: Right Arm)   Pulse (!) 138   Temp 98.2 F (36.8 C) (Oral)   Resp 20   Ht 5\' 3"  (1.6 m)   Wt 50.3 kg   LMP 05/01/2018   SpO2 99%   BMI 19.66 kg/m   Physical Exam Vitals signs and nursing note reviewed.  Constitutional:      Appearance: She is  well-developed.  HENT:     Head: Normocephalic and atraumatic.  Eyes:     Conjunctiva/sclera: Conjunctivae normal.  Neck:     Musculoskeletal: Neck supple.  Cardiovascular:     Rate and Rhythm: Regular rhythm. Tachycardia present.     Heart sounds: Normal heart sounds. No murmur.  Pulmonary:     Effort: Pulmonary effort is normal. No respiratory distress.     Breath sounds: Normal breath sounds. No wheezing or rales.  Abdominal:     General: Bowel sounds are normal. There is no distension.  Palpations: Abdomen is soft.     Tenderness: There is abdominal tenderness (mild) in the right upper quadrant. Negative signs include Murphy's sign and McBurney's sign.  Musculoskeletal: Normal range of motion.        General: No tenderness or deformity.  Skin:    General: Skin is warm and dry.     Findings: No erythema or rash.  Neurological:     Mental Status: She is alert and oriented to person, place, and time.  Psychiatric:        Behavior: Behavior normal.      ED Treatments / Results  Labs (all labs ordered are listed, but only abnormal results are displayed) Labs Reviewed  CBC WITH DIFFERENTIAL/PLATELET  COMPREHENSIVE METABOLIC PANEL  LIPASE, BLOOD  URINALYSIS, ROUTINE W REFLEX MICROSCOPIC  PREGNANCY, URINE  TYPE AND SCREEN    EKG None  Radiology No results found.  Procedures Procedures (including critical care time)  Medications Ordered in ED Medications  sodium chloride 0.9 % bolus 1,000 mL (has no administration in time range)     Initial Impression / Assessment and Plan / ED Course  I have reviewed the triage vital signs and the nursing notes.  Pertinent labs & imaging results that were available during my care of the patient were reviewed by me and considered in my medical decision making (see chart for details).  Clinical Course as of May 03 13  Tue May 03, 2018  2033 Patient resting comfortably in bed, no acute distress.  She is complaining of  some of her pain returning.  CT shows some inflammation of the stomach and some inflammation around the gallbladder.  Patient does have right upper quadrant pain so we will proceed with a right upper quadrant ultrasound to rule out acute cholecystitis.  Patient remains slightly tachycardic.  She is getting her second liter of fluids.   [CK]  Wed May 04, 2018  0005 Temp: 98.2 F (36.8 C) [CK]  0012 Feeling significantly better at this time.  Her abdomen is soft and nontender to palpation.  She has been p.o. tolerant to water.  Patient has had no hematemesis or vomiting in the ED.   [CK]    Clinical Course User Index [CK] Etter Sjogren, PA-C       Patient presented with abdominal pain for 3 days and vomiting.  She noted hematemesis x2 this morning.  On arrival patient had a temperature of 100.3 and was tachycardic.  Blood cultures and lactic ordered.  Broad-spectrum antibiotics started for possible infectious process.  She had right upper quadrant abdominal pain on physical exam.  Blood work shows no significant drop in her hemoglobin.  BUN is normal.  Urine shows no evidence of UTI.  She received a CT scan to rule out acute intra-abdominal process.  Her CT shows some possible peptic ulcer disease.  CT also was concerning for possible acute cholecystitis.  She showed no evidence of acute appendicitis, perforation, abscess.  Patient received a gallbladder ultrasound which shows no evidence of acute cholecystitis.  Patient presented with a tachycardia in the 130s, this has significantly improved down to 100 after fluids.  She has had no episodes of vomiting or hematemesis in the ED.  She has been p.o. tolerant to water.  Patient is feeling significantly better.  Her lactate is normal.  Her white count is only mildly elevated.  Do not feel that patient has an infectious abscess at this time that needs further antibiotics.  History and physical  most consistent with peptic ulcer disease secondary to  increased NSAID use.    She is in no acute distress, nontoxic, non-lethargic.  Patient instructed to discontinue NSAID use and will place omeprazole 20 mg daily.  Patient given strict return precautions and expressed understanding.  Reviewed abnormal CT findings and encouraged follow-up with GI.  She is ready and stable for discharge.  Final Clinical Impressions(s) / ED Diagnoses   Final diagnoses:  None    ED Discharge Orders    None       Etter Sjogren, PA-C 05/04/18 Nori Riis, MD 05/04/18 1506

## 2018-05-03 NOTE — ED Notes (Signed)
Patient transported to Ultrasound 

## 2018-05-03 NOTE — ED Triage Notes (Signed)
RLQ abd pain x 2 days. States she vomited bright red  blood today.

## 2018-05-03 NOTE — ED Notes (Signed)
PT unable to give urine sample at this time.   

## 2018-05-03 NOTE — ED Notes (Signed)
No emesis since arrival.  

## 2018-05-04 MED ORDER — OMEPRAZOLE 20 MG PO CPDR: 20.0000 mg | DELAYED_RELEASE_CAPSULE | Freq: Every day | ORAL | 0 refills | Status: DC

## 2018-05-04 NOTE — Discharge Instructions (Signed)
Omeprazole as prescribed.  Stop meloxicam, ibuprofen and other NSAIDs.  Follow-up with GI within 1 to 2 weeks for continued evaluation and possible endoscopy regarding CT findings of PUD.  Return to the ED immediately for new or worsening symptoms or concerns, such as fevers, vomiting, new or worsening abdominal pain, chest pain, shortness of breath or any concerns at all

## 2018-05-06 ENCOUNTER — Telehealth: Payer: Self-pay | Admitting: Gastroenterology

## 2018-05-06 NOTE — Telephone Encounter (Signed)
Pt is scheduled for OV June 03, 2018.  Pt reported having blood in stool.  Please advise.

## 2018-05-06 NOTE — Telephone Encounter (Signed)
Patient can be offered an APP appt or see primary care until OV

## 2018-05-08 LAB — CULTURE, BLOOD (ROUTINE X 2)
Culture: NO GROWTH
Culture: NO GROWTH
SPECIAL REQUESTS: ADEQUATE
Special Requests: ADEQUATE

## 2018-05-10 NOTE — Progress Notes (Deleted)
   CC: ***  HPI:  Ms.Rae Jerilynn Mages Edge is a 38 y.o. with a PMH of ***  Please see problem based Assessment and Plan for status of patients chronic conditions.  Past Medical History:  Diagnosis Date  . Abnormal uterine bleeding 02/04/2012   Occurred 01/12/12-01/15/12. Evaluated in [facility]U and all labs, exam, and pelvic u/s WNL. Given rx for ibuprofen and told to follow up as otupatient.    . Cholelithiasis 10/26/2014  . Chronic back pain   . Neuromuscular disorder ([place])   . Tobacco abuse     Review of Systems:   ***  Physical Exam:  There were no vitals filed for this visit. GENERAL- alert, co-operative, appears as stated age, not in any distress. HEENT- Atraumatic, normocephalic, PERRL, EOMI, oral mucosa appears moist. CARDIAC- RRR, no murmurs, rubs or gallops. RESP- Moving equal volumes of air, and clear to auscultation bilaterally, no wheezes or crackles. ABDOMEN- Soft, nontender, bowel sounds present. NEURO- CN 2-12 grossly intact. EXTREMITIES- pulse 2+, symmetric, no pedal edema. SKIN- Warm, dry, no rash or lesion. PSYCH- Normal mood and affect, appropriate thought content and speech.  Assessment & Plan:   See Encounters Tab for problem based charting.   Patient {GC/GE:[id number]::"discussed with","seen with"} Dr. {NAMES:[id number]::"Butcher","Granfortuna","E. Hoffman","Klima","Mullen","Narendra","Raines","Vincent"}   Alphonzo Grieve, MD Internal Medicine PGY-3

## 2018-05-11 ENCOUNTER — Encounter: Payer: Medicaid Other | Admitting: Internal Medicine

## 2018-05-13 ENCOUNTER — Other Ambulatory Visit: Payer: Self-pay | Admitting: *Deleted

## 2018-05-13 DIAGNOSIS — M5417 Radiculopathy, lumbosacral region: Secondary | ICD-10-CM

## 2018-05-13 MED ORDER — HYDROCODONE-ACETAMINOPHEN 5-325 MG PO TABS: 1.0000 | ORAL_TABLET | Freq: Three times a day (TID) | ORAL | 0 refills | Status: DC | PRN

## 2018-05-13 NOTE — Telephone Encounter (Signed)
Pt called / stated she's having surgery Mar 23 and would like a refill on Hydrocodone or enough until her surgery. Thanks Next appt scheduled 5/13 with PCP.

## 2018-05-13 NOTE — Pre-Procedure Instructions (Signed)
Melissa Walker  05/13/2018      [facility] DRUG STORE [place], [address] [place] [address] Phone: [phone] Fax: [phone]    Your procedure is scheduled on Mar.23  Report to [facility] Entrance A at 5:30 A.M.  Call this number if you have problems the morning of surgery:  [phone]   Remember:  Do not eat or drink after midnight.      Take these medicines the morning of surgery with A SIP OF WATER :              Atenolol (tenormin)             Cyclobenzaprine (flexeril) if needed             Duloxetine (cymbalta)             flonase if needed             Hydrocodone if needed             Omeprazole (prilosec)             proventil inhaler--bring to hospital             Tizanidine(zanaflex)             7 days prior to surgery STOP taking any Aspirin (unless otherwise instructed by your surgeon), Aleve, Naproxen, Ibuprofen, Motrin, Advil, Goody's, BC's, all herbal medications, fish oil, and all vitamins, meloxicam (mobic)    Do not wear jewelry, make-up or nail polish.  Do not wear lotions, powders, or perfumes, or deodorant.  Do not shave 48 hours prior to surgery.  Men may shave face and neck.  Do not bring valuables to the hospital.  [facility] is not responsible for any belongings or valuables.  Contacts, dentures or bridgework may not be worn into surgery.  Leave your suitcase in the car.  After surgery it may be brought to your room.  For patients admitted to the hospital, discharge time will be determined by your treatment team.  Patients discharged the day of surgery will not be allowed to drive home.    Special instructions:  [place]- Preparing For Surgery  Before surgery, you can play an important role. Because skin is not sterile, your skin needs to be as free of germs as possible. You can reduce the number of germs on your skin by washing  with CHG (chlorahexidine gluconate) Soap before surgery.  CHG is an antiseptic cleaner which kills germs and bonds with the skin to continue killing germs even after washing.    Oral Hygiene is also important to reduce your risk of infection.  Remember - BRUSH YOUR TEETH THE MORNING OF SURGERY WITH YOUR REGULAR TOOTHPASTE  Please do not use if you have an allergy to CHG or antibacterial soaps. If your skin becomes reddened/irritated stop using the CHG.  Do not shave (including legs and underarms) for at least 48 hours prior to first CHG shower. It is OK to shave your face.  Please follow these instructions carefully.   1. Shower the NIGHT BEFORE SURGERY and the MORNING OF SURGERY with CHG.   2. If you chose to wash your hair, wash your hair first as usual with your normal shampoo.  3. After you shampoo, rinse your hair and body thoroughly to remove the shampoo.  4. Use CHG as you  would any other liquid soap. You can apply CHG directly to the skin and wash gently with a scrungie or a clean washcloth.   5. Apply the CHG Soap to your body ONLY FROM THE NECK DOWN.  Do not use on open wounds or open sores. Avoid contact with your eyes, ears, mouth and genitals (private parts). Wash Face and genitals (private parts)  with your normal soap.  6. Wash thoroughly, paying special attention to the area where your surgery will be performed.  7. Thoroughly rinse your body with warm water from the neck down.  8. DO NOT shower/wash with your normal soap after using and rinsing off the CHG Soap.  9. Pat yourself dry with a CLEAN TOWEL.  10. Wear CLEAN PAJAMAS to bed the night before surgery, wear comfortable clothes the morning of surgery  11. Place CLEAN SHEETS on your bed the night of your first shower and DO NOT SLEEP WITH PETS.    Day of Surgery:  Do not apply any deodorants/lotions.  Please wear clean clothes to the hospital/surgery center.   Remember to brush your teeth WITH YOUR REGULAR  TOOTHPASTE.    Please read over the following fact sheets that you were given. Coughing and Deep Breathing, MRSA Information and Surgical Site Infection Prevention

## 2018-05-16 ENCOUNTER — Encounter (HOSPITAL_COMMUNITY)
Admission: RE | Admit: 2018-05-16 | Discharge: 2018-05-16 | Disposition: A | Discharge status: Home / Self Care | Payer: Medicaid Other | Source: Ambulatory Visit | Attending: Neurosurgery | Admitting: Neurosurgery

## 2018-05-16 ENCOUNTER — Other Ambulatory Visit: Payer: Self-pay

## 2018-05-16 ENCOUNTER — Encounter (HOSPITAL_COMMUNITY): Payer: Self-pay

## 2018-05-16 DIAGNOSIS — Z01818 Encounter for other preprocedural examination: Secondary | ICD-10-CM | POA: Diagnosis not present

## 2018-05-16 HISTORY — DX: Bronchitis, not specified as acute or chronic: J40

## 2018-05-16 HISTORY — DX: Gastro-esophageal reflux disease without esophagitis: K21.9

## 2018-05-16 HISTORY — DX: Anxiety disorder, unspecified: F41.9

## 2018-05-16 HISTORY — DX: Essential (primary) hypertension: I10

## 2018-05-16 HISTORY — DX: Cardiac arrhythmia, unspecified: I49.9

## 2018-05-16 LAB — CBC
HCT: 28.9 % — ABNORMAL LOW (ref 36.0–46.0)
Hemoglobin: 9.2 g/dL — ABNORMAL LOW (ref 12.0–15.0)
MCH: 33.8 pg (ref 26.0–34.0)
MCHC: 31.8 g/dL (ref 30.0–36.0)
MCV: 106.3 fL — ABNORMAL HIGH (ref 80.0–100.0)
Platelets: 693 10*3/uL — ABNORMAL HIGH (ref 150–400)
RBC: 2.72 MIL/uL — ABNORMAL LOW (ref 3.87–5.11)
RDW: 12.9 % (ref 11.5–15.5)
WBC: 7.4 10*3/uL (ref 4.0–10.5)
nRBC: 0 % (ref 0.0–0.2)

## 2018-05-16 LAB — BASIC METABOLIC PANEL
Anion gap: 9 (ref 5–15)
BUN: 8 mg/dL (ref 6–20)
CO2: 25 mmol/L (ref 22–32)
Calcium: 9.3 mg/dL (ref 8.9–10.3)
Chloride: 104 mmol/L (ref 98–111)
Creatinine, Ser: 0.73 mg/dL (ref 0.44–1.00)
GFR calc Af Amer: >60 mL/min (ref >60)
GFR calc non Af Amer: >60 mL/min (ref >60)
Glucose, Bld: 91 mg/dL (ref 70–99)
Potassium: 3.6 mmol/L (ref 3.5–5.1)
Sodium: 138 mmol/L (ref 135–145)

## 2018-05-16 LAB — TYPE AND SCREEN
ABO/RH(D): A NEG
Antibody Screen: NEGATIVE

## 2018-05-16 LAB — SURGICAL PCR SCREEN
MRSA, PCR: NEGATIVE
Staphylococcus aureus: POSITIVE — AB

## 2018-05-16 NOTE — Pre-Procedure Instructions (Signed)
Melissa Walker  05/16/2018      [facility] DRUG STORE [place], [address] [place] [address] Phone: [phone] Fax: [phone]    Your procedure is scheduled on, Monday,  Mar.23rd   Report to [facility] Entrance A at 5:30 A.M.             (posted surgery 7:30a - 12:22p)   Call this number if you have problems the morning of surgery:  [phone]   Remember:   Do not eat any foods or drink any liquids after midnight, Sunday.     Take these medicines the morning of surgery with A SIP OF WATER :              Atenolol (tenormin)             Cyclobenzaprine (flexeril) if needed             Duloxetine (cymbalta)             flonase if needed             Hydrocodone if needed             Omeprazole (prilosec)             proventil inhaler--bring to hospital             Tizanidine(zanaflex)               7 days prior to surgery STOP taking any Aspirin (unless otherwise instructed by your surgeon), Aleve, Naproxen, Ibuprofen, Motrin, Advil, Goody's, BC's, all herbal medications, fish oil, and all vitamins, meloxicam (mobic)    Do not wear jewelry, make-up or nail polish.  Do not wear lotions, powders, or perfumes, or deodorant.  Do not shave 48 hours prior to surgery.    Do not bring valuables to the hospital.  [facility] is not responsible for any belongings or valuables.  Contacts, dentures or bridgework may not be worn into surgery.  Leave your suitcase in the car.  After surgery it may be brought to your room.  For patients admitted to the hospital, discharge time will be determined by your treatment team.     Special instructions:  [place]- Preparing For Surgery  Before surgery, you can play an important role. Because skin is not sterile, your skin needs to be as free of germs as possible. You can reduce the number of germs on your skin by washing with  CHG (chlorahexidine gluconate) Soap before surgery.  CHG is an antiseptic cleaner which kills germs and bonds with the skin to continue killing germs even after washing.    Oral Hygiene is also important to reduce your risk of infection.    Remember - BRUSH YOUR TEETH THE MORNING OF SURGERY WITH YOUR REGULAR TOOTHPASTE  Please do not use if you have an allergy to CHG or antibacterial soaps. If your skin becomes reddened/irritated stop using the CHG.  Do not shave (including legs and underarms) for at least 48 hours prior to first CHG shower. It is OK to shave your face.  Please follow these instructions carefully.   1. Shower the NIGHT BEFORE SURGERY and the MORNING OF SURGERY with CHG.   2. If you chose to wash your hair, wash your hair first as usual with your normal shampoo.  3. After you shampoo, rinse your hair and  body thoroughly to remove the shampoo.  4. Use CHG as you would any other liquid soap. You can apply CHG directly to the skin and wash gently with a scrungie or a clean washcloth.   5. Apply the CHG Soap to your body ONLY FROM THE NECK DOWN.  Do not use on open wounds or open sores. Avoid contact with your eyes, ears, mouth and genitals (private parts). Wash Face and genitals (private parts)  with your normal soap.  6. Wash thoroughly, paying special attention to the area where your surgery will be performed.  7. Thoroughly rinse your body with warm water from the neck down.  8. DO NOT shower/wash with your normal soap after using and rinsing off the CHG Soap.  9. Pat yourself dry with a CLEAN TOWEL.  10. Wear CLEAN PAJAMAS to bed the night before surgery, wear comfortable clothes the morning of surgery  11. Place CLEAN SHEETS on your bed the night of your first shower and DO NOT SLEEP WITH PETS.  Day of Surgery:  Do not apply any deodorants/lotions.  Please wear clean clothes to the hospital/surgery center.   Remember to brush your teeth WITH YOUR REGULAR  TOOTHPASTE.  Please read over the following fact sheets that you were given. MRSA Information and Surgical Site Infection Prevention

## 2018-05-16 NOTE — Progress Notes (Signed)
PCP -  [facility] (on ground floor)  Cardiologist -  denies  Chest x-ray - denies  EKG -  05/16/2018  Stress Test - denies ECHO - denies Cardiac Cath - denies  Sleep Study - n/a CPAP -   Fasting Blood Sugar - n/a Checks Blood Sugar _____ times a day  Blood Thinner Instructions: Aspirin Instructions:  Anesthesia review:  no  Patient denies shortness of breath, fever, cough and chest pain at PAT appointment   Patient verbalized understanding of instructions that were given to them at the PAT appointment. Patient was also instructed that they will need to review over the PAT instructions again at home before surgery.  Patient has multiple piercings in both ears.  She is aware that she needs to remove them.

## 2018-05-17 ENCOUNTER — Encounter (HOSPITAL_COMMUNITY): Payer: Self-pay | Admitting: Physician Assistant

## 2018-05-17 LAB — ABO/RH: ABO/RH(D): A NEG

## 2018-05-23 ENCOUNTER — Encounter (HOSPITAL_COMMUNITY): Payer: Self-pay

## 2018-05-23 ENCOUNTER — Inpatient Hospital Stay (HOSPITAL_COMMUNITY): Admit: 2018-05-23 | Payer: Medicaid Other | Admitting: Neurosurgery

## 2018-05-23 SURGERY — POSTERIOR LUMBAR FUSION 1 LEVEL
Anesthesia: General | Site: Back

## 2018-05-26 ENCOUNTER — Other Ambulatory Visit: Payer: Self-pay

## 2018-05-26 ENCOUNTER — Encounter: Payer: Self-pay | Admitting: Gastroenterology

## 2018-05-26 ENCOUNTER — Ambulatory Visit (INDEPENDENT_AMBULATORY_CARE_PROVIDER_SITE_OTHER): Payer: Medicaid Other | Admitting: Gastroenterology

## 2018-05-26 DIAGNOSIS — K279 Peptic ulcer, site unspecified, unspecified as acute or chronic, without hemorrhage or perforation: Secondary | ICD-10-CM | POA: Diagnosis not present

## 2018-05-26 DIAGNOSIS — R933 Abnormal findings on diagnostic imaging of other parts of digestive tract: Secondary | ICD-10-CM | POA: Diagnosis not present

## 2018-05-26 DIAGNOSIS — D539 Nutritional anemia, unspecified: Secondary | ICD-10-CM | POA: Diagnosis not present

## 2018-05-26 DIAGNOSIS — K920 Hematemesis: Secondary | ICD-10-CM

## 2018-05-26 MED ORDER — OMEPRAZOLE 40 MG PO CPDR: 40.0000 mg | DELAYED_RELEASE_CAPSULE | Freq: Every day | ORAL | 3 refills | Status: DC

## 2018-05-26 NOTE — Patient Instructions (Addendum)
I have recommended a CBC to follow your blood counts, you can come to the office and have this done Anytime Mon-Fri. 7:30 am-5:30 pm.The lab is located on our basement level. When you get on the elevator just hit B.     H pylori antigen to screen for an infection associated with peptic ulcer disease. You can wait to have this test done until your endoscopy. They will give you the container when you have your blood work done.   Increase omeprazole 40 mg daily for 8 weeks. This has been sent into your pharmacy.   Avoid all NSAIDs.   EGD recommended when the Covid19 restrictions are lifted.  Referral to GYN to evaluate a pelvic cystic mass.  Thank you for your patience with me and our technology today! Please stay home, safe, and healthy. I look forward to meeting you in person in the future.

## 2018-05-26 NOTE — Progress Notes (Signed)
TELEHEALTH VISIT  Referring Provider: Alphonzo Grieve, MD Primary Care Physician:  Alphonzo Grieve, MD   Tele-visit due to COVID-19 pandemic Patient requested visit virtually, consented to the encounter telephone call Patient seems aware of limitations, risks, security and privacy concerns of performing an evaluation and management service by telephone and the ongoing risk and availability of in-person appointments  Contact made at: 15:30 05/26/18 Patient verified by name and date of birth Location of patient: Home Location provider: Home office Names of persons participating: Me, patient Time spent on call: 26 minutes   Reason for Consultation:  Ulcer, blood in the stool   IMPRESSION:  Recent hematemesis and melena Abnormal CT 05/03/18    -  right lower quadrant pain showed a thickened pylorus    - rounded and oval soft tissue filling defects in the proximal stomach Daily NSAIDs -  meloxicam and ibuprofen for back pain Cholelithiasis Macrocytic anemia    - 05/03/18: hgb 11.0    - 05/16/18 hgb 9.2 6.1 x 2.3 cm posterior pelvic cystic mass on CT 05/03/18  Recent hematemesis with progressive macrocytic anemia. CT scan and recent NSAIDs support the diagnosis of peptic ulcer disease. She is on low dose PPI therapy with stabilization of symptoms. Neurosurgeon will not consider surgery until she has been cleared by GI.   I don't think the pelvic cystic mass is causing her recent symptoms, but, given it's size, I have suggested that she follow-up with GYN.   PLAN: CBC to monitor anemia Increase omeprazole 40 mg daily x 8 weeks for empiric PUD treatment Avoid all NSAIDs EGD with biopsies for H pylori  Referral to GYN to evaluate a pelvic cystic mass  I consented the patient at the bedside today discussing the risks, benefits, and alternatives to endoscopic evaluation. In particular, we discussed the risks that include, but are not limited to, reaction to medication, cardiopulmonary  compromise, bleeding requiring blood transfusion, aspiration resulting in pneumonia, perforation requiring surgery, lack of diagnosis, severe illness requiring hospitalization, and even death. We reviewed the risk of missed lesion including polyps or even cancer. The patient acknowledges these risks and asks that we proceed.  26 minutes spent with the patient today. Greater than 50% was spent in counseling and coordination of care with the patient   HPI: Melissa Walker is a 38 y.o. female at home with her 39 year old and a 39 year old. The history is obtained through the patient and review of her electronic health record.   Acute onset of nausea and diffuse, predominantly right-sided abdominal pain x 3 days. Hematemesis x 2. Seen in the ED. Found to be tachycardic. Diagnosed with PUD after labs and CT scan. Sent home on omeprazole 20 mg daily. Followed by 2-3 days of melena. No dark stools since that time. Was supposed to have back surgery this week. During preoperative visit her neurosurgeon for a lumbar 5 sacral 1 decompression she was called and procedure was cancelled due to the blood work. Has to be cleared by GI prior to surgery.  Nausea has improved, but, is still present. No dysphagia, odynophagia, heartburn, or other change in bowel habits.   Has been using meloxicam and ibuprofen for back pain when acetaminophen was not provided relief prior to the trip to the ED.   Labs 05/03/18 showed a normal CMP except for K 3.4, alb 3.0. Liver enzymes (AST 12, ALT 8, alk phos 77, TB 0.3) and lipase (27) were normal.  Hgb 11.0, MCV  RDW 12.3. WBC 12.5, platelets 540.  °CT abdomen/pelvis with contrast 05/03/18 for right lower quadrant pain showed a thickened pylorus, rounded and oval soft tissue filling defects in the proximal stomach. Possible edema adjacent to the gallbladder - ultrasound recommended.  There is a 6.1 x 2.3 cm posterior pelvic cystic mass.  °Abdominal ultrasound 05/03/18 for abdominal pain  and vomiting showed cholelithiasis with no other findings. Large gallstone measures up to 2.6 cm. No gallbladder wall edema identified.  °Labs 05/16/18: normal BMP, WBC 7.4, hgb 9.2, RDW 12.9, MCV 106.3  ° °No known family history of colon cancer or polyps. No family history of uterine/endometrial cancer, pancreatic cancer or gastric/stomach cancer. ° °Past Medical History:  °Diagnosis Date  °• Abnormal uterine bleeding 02/04/2012  ° Occurred 01/12/12-01/15/12. Evaluated in [facility] and all labs, exam, and pelvic u/s WNL. Given rx for ibuprofen and told to follow up as otupatient.    °• Anxiety   ° Pt states she's on cymbalta "to calm me down"  °• Bronchitis   ° APPROX 6 MTHS AGO  °• Cholelithiasis 10/26/2014  °• Chronic back pain   °• Dysrhythmia   ° RACING HEART - SHE SAID MEDICATION RELATED  °• GERD (gastroesophageal reflux disease)   ° SCHEDULED TO SEE GI MD IN APRIL 2020  °• Hypertension   °• Neuromuscular disorder (HCC)   °• Tobacco abuse   ° ° °Past Surgical History:  °Procedure Laterality Date  °• DENTAL REHABILITATION    ° full set dentures  °• LUMBAR LAMINECTOMY/DECOMPRESSION MICRODISCECTOMY Right 07/23/2014  ° Procedure: Right Lumbar five-Sacral one laminotomy and microdiskectomy;  Surgeon: Robert Nudelman, MD;  Location: [facility];  Service: Neurosurgery;  Laterality: Right;  Right Lumbar five-Sacral one laminotomy and microdiskectomy  °• Multiple epidural steroid injections in back    ° Over past 2 years  °• TUBAL LIGATION Bilateral 05/11/2013  ° Procedure: POST PARTUM TUBAL LIGATION;  Surgeon: Ugonna A Anyanwu, MD;  Location: [facility];  Service: Gynecology;  Laterality: Bilateral;  ° ° °Current Outpatient Medications  °Medication Sig Dispense Refill  °• atenolol (TENORMIN) 25 MG tablet TAKE 1/2 TABLET(12.5 MG) BY MOUTH DAILY (Patient taking differently: Take 12.5 mg by mouth daily. ) 45 tablet 1  °• benzonatate (TESSALON) 100 MG capsule Take 1-2 capsules (100-200 mg total) by mouth 3 (three) times daily as  needed. (Patient not taking: Reported on 05/10/2018) 60 capsule 0  °• cyclobenzaprine (FLEXERIL) 5 MG tablet TAKE 1 TABLET(5 MG) BY MOUTH THREE TIMES DAILY AS NEEDED FOR MUSCLE SPASMS (Patient taking differently: Take 5 mg by mouth 3 (three) times daily as needed for muscle spasms. ) 90 tablet 1  °• DULoxetine (CYMBALTA) 60 MG capsule Take 1 capsule (60 mg total) by mouth daily. 30 capsule 5  °• fluticasone (FLONASE) 50 MCG/ACT nasal spray Place 1 spray into both nostrils daily. (Patient taking differently: Place 1 spray into both nostrils daily as needed for allergies. ) 16 g 2  °• HYDROcodone-acetaminophen (NORCO) 5-325 MG tablet Take 1 tablet by mouth every 8 (eight) hours as needed for moderate pain or severe pain. 90 tablet 0  °• lisinopril (PRINIVIL,ZESTRIL) 20 MG tablet TAKE 1 TABLET(20 MG) BY MOUTH DAILY (Patient taking differently: Take 20 mg by mouth daily. ) 90 tablet 3  °• meloxicam (MOBIC) 7.5 MG tablet Take 1 tablet (7.5 mg total) by mouth daily. 30 tablet 0  °• omeprazole (PRILOSEC) 20 MG capsule Take 1 capsule (20 mg total) by mouth daily. 30 capsule 0  °•   PROVENTIL HFA 108 (90 Base) MCG/ACT inhaler INHALE 2 PUFFS INTO THE LUNGS EVERY 6 HOURS AS NEEDED FOR WHEEZING OR SHORTNESS OF BREATH (Patient taking differently: Inhale 2 puffs into the lungs every 6 (six) hours as needed for wheezing or shortness of breath. ) 6.7 g 0  °• tiZANidine (ZANAFLEX) 2 MG tablet Take 2 mg by mouth 2 (two) times daily.    ° °No current facility-administered medications for this visit.   ° ° °Allergies as of 05/26/2018 - Review Complete 05/16/2018  °Allergen Reaction Noted  °• Penicillins    °• Nicotine Rash   ° ° °Family History  °Problem Relation Age of Onset  °• Cancer Father   °     brain tumor   °• Eclampsia Maternal Grandmother   °• Heart attack Maternal Grandmother   °• Cancer Maternal Grandmother   °     lung  °• Heart disease Maternal Grandmother   °• Hyperlipidemia Maternal Grandmother   °• Hypertension Maternal  Grandmother   ° ° °Social History  ° °Socioeconomic History  °• Marital status: Single  °  Spouse name: Not on file  °• Number of children: Not on file  °• Years of education: Not on file  °• Highest education level: Not on file  °Occupational History  °• Not on file  °Social Needs  °• Financial resource strain: Not on file  °• Food insecurity:  °  Worry: Not on file  °  Inability: Not on file  °• Transportation needs:  °  Medical: Not on file  °  Non-medical: Not on file  °Tobacco Use  °• Smoking status: Current Every Day Smoker  °  Packs/day: 1.00  °  Years: 18.00  °  Pack years: 18.00  °  Types: Cigarettes  °• Smokeless tobacco: Never Used  °• Tobacco comment: sometimes less. Cutting back  °Substance and Sexual Activity  °• Alcohol use: Yes  °  Alcohol/week: 0.0 standard drinks  °  Comment: occasional  °• Drug use: No  °• Sexual activity: Yes  °  Birth control/protection: Condom, Surgical  °Lifestyle  °• Physical activity:  °  Days per week: Not on file  °  Minutes per session: Not on file  °• Stress: Not on file  °Relationships  °• Social connections:  °  Talks on phone: Not on file  °  Gets together: Not on file  °  Attends religious service: Not on file  °  Active member of club or organization: Not on file  °  Attends meetings of clubs or organizations: Not on file  °  Relationship status: Not on file  °• Intimate partner violence:  °  Fear of current or ex partner: Not on file  °  Emotionally abused: Not on file  °  Physically abused: Not on file  °  Forced sexual activity: Not on file  °Other Topics Concern  °• Not on file  °Social History Narrative  °• Not on file  ° ° °Review of Systems: °ALL ROS discussed and all others negative except listed in HPI. ° °Physical Exam: °General: in no acute distress °Neuro: Alert and appropriate °Psych: Normal affect and normal insight ° ° ° L. , MD, MPH °[facility] °05/26/2018, 3:23 PM ° ° ° °  °

## 2018-05-30 ENCOUNTER — Telehealth: Payer: Self-pay | Admitting: Gastroenterology

## 2018-05-30 MED ORDER — FAMOTIDINE 20 MG PO TABS: 20.0000 mg | ORAL_TABLET | Freq: Two times a day (BID) | ORAL | 3 refills | Status: DC

## 2018-05-30 NOTE — Telephone Encounter (Signed)
Thank you :)

## 2018-05-30 NOTE — Telephone Encounter (Signed)
Pt called said she was given omeprazole (PRILOSEC)  but is feeling nausea when she takes it.

## 2018-05-30 NOTE — Telephone Encounter (Signed)
Patient states she started taking increase dose of Omeprazole Friday. On Saturday she started feeling nauseous and it has continued until today, worse this morning. She states she hasn't felt like eating much since Saturday and she has been taking all her medications and omeprazole on an empty stomach. Please advise.

## 2018-05-30 NOTE — Telephone Encounter (Signed)
I am sorry to hear that she is having trouble. She could resume her prior dose of omeprazole at 20 mg daily (as she was tolerating that without symptoms) and add famotidine 20 mg BID to the regimen. Thanks.

## 2018-05-30 NOTE — Telephone Encounter (Signed)
Patient informed. She states she doesn't have anymore 20 mg and she has lots of 40 mg. So she will take 40 mg in the morning then take Famotidine 20 mg twice a day.

## 2018-06-03 ENCOUNTER — Telehealth: Payer: Self-pay | Admitting: Gastroenterology

## 2018-06-03 ENCOUNTER — Ambulatory Visit: Payer: Medicaid Other | Admitting: Gastroenterology

## 2018-06-03 NOTE — Telephone Encounter (Signed)
Dr. Tarri Glenn,  Your last office note states you want patient to have EGD after covid-19 restrictions are lifted but patient is requesting to have EGD before her surgery scheduled for 06/13/18. Please advise.

## 2018-06-03 NOTE — Telephone Encounter (Signed)
Pt stated that she has discussed EGD with Dr. Tarri Glenn and would like to schedule before she has back surgery on April 13th.

## 2018-06-03 NOTE — Telephone Encounter (Signed)
Due to [place], we are unable to perform her EGD until the restrictions are lifted. We will definitely reach out to her as soon as we are able to resume upper endoscopy to schedule her appointment. Thank you.

## 2018-06-03 NOTE — Telephone Encounter (Signed)
Informed patient that we cannot schedule her EGD at this time with the Covid19 restrictions and we will schedule as soon as the restrictions are lifted. Patient asked if she needs to still get labs done. Informed patient to still get lab work done. Patient verbalized understanding.

## 2018-06-09 ENCOUNTER — Other Ambulatory Visit: Payer: Self-pay

## 2018-06-09 DIAGNOSIS — M5417 Radiculopathy, lumbosacral region: Secondary | ICD-10-CM

## 2018-06-09 NOTE — Telephone Encounter (Signed)
HYDROcodone-acetaminophen (NORCO) 5-325 MG tablet   Refill request @  [id number], [place] [phone] (Phone) [phone] (Fax)

## 2018-06-10 MED ORDER — HYDROCODONE-ACETAMINOPHEN 5-325 MG PO TABS: 1.0000 | ORAL_TABLET | Freq: Three times a day (TID) | ORAL | 0 refills | Status: DC | PRN

## 2018-06-10 NOTE — Telephone Encounter (Signed)
Refill for hydrocodone-appa 5-325mg  #90 per month approved. Patient on long-term opioid therapy for lumbar spine pain; she was supposed to have repeat neurosurgery however was canceled due to COVID-19 epidemic. PMP database reviewed today and appropriate. Patient has appt with me 07/13/2018.  Of note, since last visit, patient had ED visit for hematemesis. CT abd showed slightly thickened appearance of pylorus that could be 2/2 spasm or PUD, as well as an incidental finding of 6.1x2.3cm posterior pelvic cystic mass. She was evaluated by gastroenterology and told to stop meloxicam, started PPI and was supposed to have EGD however was canceled due to COVID-19 pandemic. She is still supposed to have H pylori and cbc tested that will be followed by GI. Will wait until restrictions on visits and diagnostic testing are lifted to f/u on incidental cystic pelvic mass finding; from chart review GI note states they placed Gyn referral and discussed this with patient. Once restrictions are lifted, will make sure that this is done.  Alphonzo Grieve, MD IMTS - PGY3 Pager [phone]

## 2018-06-13 DIAGNOSIS — M5126 Other intervertebral disc displacement, lumbar region: Secondary | ICD-10-CM | POA: Diagnosis not present

## 2018-06-13 DIAGNOSIS — M47816 Spondylosis without myelopathy or radiculopathy, lumbar region: Secondary | ICD-10-CM | POA: Diagnosis not present

## 2018-06-13 DIAGNOSIS — Z9889 Other specified postprocedural states: Secondary | ICD-10-CM | POA: Diagnosis not present

## 2018-06-13 DIAGNOSIS — M5416 Radiculopathy, lumbar region: Secondary | ICD-10-CM | POA: Diagnosis not present

## 2018-06-13 DIAGNOSIS — M5136 Other intervertebral disc degeneration, lumbar region: Secondary | ICD-10-CM | POA: Diagnosis not present

## 2018-06-14 ENCOUNTER — Other Ambulatory Visit (INDEPENDENT_AMBULATORY_CARE_PROVIDER_SITE_OTHER): Payer: Medicaid Other

## 2018-06-14 DIAGNOSIS — K279 Peptic ulcer, site unspecified, unspecified as acute or chronic, without hemorrhage or perforation: Secondary | ICD-10-CM | POA: Diagnosis not present

## 2018-06-14 LAB — CBC WITH DIFFERENTIAL/PLATELET
Basophils Absolute: 0.1 10*3/uL (ref 0.0–0.1)
Basophils Relative: 1 % (ref 0.0–3.0)
Eosinophils Absolute: 0.1 10*3/uL (ref 0.0–0.7)
Eosinophils Relative: 1.8 % (ref 0.0–5.0)
HCT: 30.1 % — ABNORMAL LOW (ref 36.0–46.0)
Hemoglobin: 10.4 g/dL — ABNORMAL LOW (ref 12.0–15.0)
Lymphocytes Relative: 35 % (ref 12.0–46.0)
Lymphs Abs: 2.7 10*3/uL (ref 0.7–4.0)
MCHC: 34.5 g/dL (ref 30.0–36.0)
MCV: 102.1 fl — ABNORMAL HIGH (ref 78.0–100.0)
Monocytes Absolute: 0.5 10*3/uL (ref 0.1–1.0)
Monocytes Relative: 6.4 % (ref 3.0–12.0)
Neutro Abs: 4.2 10*3/uL (ref 1.4–7.7)
Neutrophils Relative %: 55.8 % (ref 43.0–77.0)
Platelets: 467 10*3/uL — ABNORMAL HIGH (ref 150.0–400.0)
RBC: 2.95 Mil/uL — ABNORMAL LOW (ref 3.87–5.11)
RDW: 14.1 % (ref 11.5–15.5)
WBC: 7.6 10*3/uL (ref 4.0–10.5)

## 2018-06-20 ENCOUNTER — Other Ambulatory Visit: Payer: Self-pay | Admitting: Neurosurgery

## 2018-06-20 DIAGNOSIS — M47816 Spondylosis without myelopathy or radiculopathy, lumbar region: Secondary | ICD-10-CM | POA: Diagnosis not present

## 2018-06-20 DIAGNOSIS — M5416 Radiculopathy, lumbar region: Secondary | ICD-10-CM | POA: Diagnosis not present

## 2018-06-20 DIAGNOSIS — Z9889 Other specified postprocedural states: Secondary | ICD-10-CM | POA: Diagnosis not present

## 2018-06-20 DIAGNOSIS — M5126 Other intervertebral disc displacement, lumbar region: Secondary | ICD-10-CM | POA: Diagnosis not present

## 2018-06-20 DIAGNOSIS — M5136 Other intervertebral disc degeneration, lumbar region: Secondary | ICD-10-CM | POA: Diagnosis not present

## 2018-06-21 NOTE — Pre-Procedure Instructions (Addendum)
Melissa Walker  06/21/2018      [id number] [address], [place] [address] Phone: [phone] Fax: [phone]    Your procedure is scheduled on 06-23-18, Thursday from 0730-1222  Report to [facility] Admitting,Entrance A  at [phone].  Call this number if you have problems the morning of surgery:  [phone]   Remember:  Do not eat or drink after midnight.    Take these medicines the morning of surgery with A SIP OF WATER:              Atenolol (TENORMIN)               DULoxetine (CYMBALTA)              Famotidine (PEPCID)             Omeprazole (PRILOSEC)              tiZANidine (ZANAFLEX)                               As needed:             fluticasone (FLONASE).             HYDROcodone-acetaminophen (NORCO)   PROVENTIL HFA                   Bring inhalers with you on day of surgery  7 days prior to surgery STOP taking any Aspirin (unless otherwise instructed by your surgeon), Aleve, Naproxen, Ibuprofen, Motrin, Advil, Goody's, BC's, all herbal medications, fish oil, and all vitamins.   Do not wear jewelry, make-up or nail polish.  Do not wear lotions, powders, or perfumes, or deodorant.  Do not shave 48 hours prior to surgery.  Men may shave face and neck.  Do not bring valuables to the hospital.  [facility] is not responsible for any belongings or valuables.  Contacts, dentures or bridgework may not be worn into surgery.  Leave your suitcase in the car.  After surgery it may be brought to your room.  For patients admitted to the hospital, discharge time will be determined by your treatment team.  Patients discharged the day of surgery will not be allowed to drive home.   Special instructions:  [place]- Preparing For Surgery  Before surgery, you can play an important role. Because skin is not sterile, your skin needs to be as free  of germs as possible. You can reduce the number of germs on your skin by washing with CHG (chlorahexidine gluconate) Soap before surgery.  CHG is an antiseptic cleaner which kills germs and bonds with the skin to continue killing germs even after washing.    Oral Hygiene is also important to reduce your risk of infection.  Remember - BRUSH YOUR TEETH THE MORNING OF SURGERY WITH YOUR REGULAR TOOTHPASTE  Please do not use if you have an allergy to CHG or antibacterial soaps. If your skin becomes reddened/irritated stop using the CHG.  Do not shave (including legs and underarms) for at least 48 hours prior to first CHG shower. It is OK to shave your face.  Please follow these instructions carefully.   1. Shower the NIGHT BEFORE SURGERY and the MORNING OF SURGERY with CHG.   2. If you chose to wash  your hair, wash your hair first as usual with your normal shampoo.  3. After you shampoo, rinse your hair and body thoroughly to remove the shampoo.  4. Use CHG as you would any other liquid soap. You can apply CHG directly to the skin and wash gently with a scrungie or a clean washcloth.   5. Apply the CHG Soap to your body ONLY FROM THE NECK DOWN.  Do not use on open wounds or open sores. Avoid contact with your eyes, ears, mouth and genitals (private parts). Wash Face and genitals (private parts)  with your normal soap.  6. Wash thoroughly, paying special attention to the area where your surgery will be performed.  7. Thoroughly rinse your body with warm water from the neck down.  8. DO NOT shower/wash with your normal soap after using and rinsing off the CHG Soap.  9. Pat yourself dry with a CLEAN TOWEL.  10. Wear CLEAN PAJAMAS to bed the night before surgery, wear comfortable clothes the morning of surgery  11. Place CLEAN SHEETS on your bed the night of your first shower and DO NOT SLEEP WITH PETS.  Day of Surgery:  Do not apply any deodorants/lotions.  Please wear clean clothes to the  hospital/surgery center.   Remember to brush your teeth WITH YOUR REGULAR TOOTHPASTE.  Please read over the following fact sheets that you were given. Pain Booklet, Coughing and Deep Breathing, MRSA Information and Surgical Site Infection Prevention

## 2018-06-22 ENCOUNTER — Encounter (HOSPITAL_COMMUNITY)
Admission: RE | Admit: 2018-06-22 | Discharge: 2018-06-22 | Disposition: A | Discharge status: Home / Self Care | Payer: Medicaid Other | Source: Ambulatory Visit | Attending: Neurosurgery | Admitting: Neurosurgery

## 2018-06-22 ENCOUNTER — Encounter (HOSPITAL_COMMUNITY): Payer: Self-pay

## 2018-06-22 ENCOUNTER — Other Ambulatory Visit: Payer: Self-pay

## 2018-06-22 DIAGNOSIS — Z01812 Encounter for preprocedural laboratory examination: Secondary | ICD-10-CM

## 2018-06-22 LAB — BASIC METABOLIC PANEL
Anion gap: 9 (ref 5–15)
BUN: 6 mg/dL (ref 6–20)
CO2: 25 mmol/L (ref 22–32)
Calcium: 8.5 mg/dL — ABNORMAL LOW (ref 8.9–10.3)
Chloride: 102 mmol/L (ref 98–111)
Creatinine, Ser: 0.72 mg/dL (ref 0.44–1.00)
GFR calc Af Amer: >60 mL/min (ref >60)
GFR calc non Af Amer: >60 mL/min (ref >60)
Glucose, Bld: 94 mg/dL (ref 70–99)
Potassium: 3.3 mmol/L — ABNORMAL LOW (ref 3.5–5.1)
Sodium: 136 mmol/L (ref 135–145)

## 2018-06-22 LAB — TYPE AND SCREEN
ABO/RH(D): A NEG
Antibody Screen: NEGATIVE

## 2018-06-22 LAB — SURGICAL PCR SCREEN
MRSA, PCR: NEGATIVE
Staphylococcus aureus: POSITIVE — AB

## 2018-06-22 NOTE — Progress Notes (Signed)
PCR: Staph positive  Betadine DOS

## 2018-06-22 NOTE — Progress Notes (Signed)
PCP - Alphonzo Grieve  EKG - 05-16-18   Anesthesia review: N/A   Patient denies shortness of breath, fever, cough and chest pain at PAT appointment   Patient verbalized understanding of instructions that were given to them at the PAT appointment. Patient was also instructed that they will need to review over the PAT instructions again at home before surgery.

## 2018-06-22 NOTE — Anesthesia Preprocedure Evaluation (Addendum)
Anesthesia Evaluation  Patient identified by MRN, date of birth, ID band Patient awake    Reviewed: Allergy & Precautions, NPO status , Patient's Chart, lab work & pertinent test results  Airway Mallampati: II  TM Distance: >3 FB     Dental   Pulmonary Current Smoker,    breath sounds clear to auscultation       Cardiovascular hypertension, + dysrhythmias  Rhythm:Regular Rate:Normal     Neuro/Psych PSYCHIATRIC DISORDERS Anxiety Depression  Neuromuscular disease    GI/Hepatic Neg liver ROS, GERD  ,  Endo/Other  negative endocrine ROS  Renal/GU negative Renal ROS     Musculoskeletal   Abdominal   Peds  Hematology   Anesthesia Other Findings   Reproductive/Obstetrics                            Anesthesia Physical Anesthesia Plan  ASA: III  Anesthesia Plan: General   Post-op Pain Management:    Induction: Intravenous  PONV Risk Score and Plan: Ondansetron, Dexamethasone and Midazolam  Airway Management Planned: Oral ETT  Additional Equipment:   Intra-op Plan:   Post-operative Plan: Extubation in OR  Informed Consent: I have reviewed the patients History and Physical, chart, labs and discussed the procedure including the risks, benefits and alternatives for the proposed anesthesia with the patient or authorized representative who has indicated his/her understanding and acceptance.     Dental advisory given  Plan Discussed with: Anesthesiologist and CRNA  Anesthesia Plan Comments:        Anesthesia Quick Evaluation

## 2018-06-23 ENCOUNTER — Other Ambulatory Visit: Payer: Self-pay

## 2018-06-23 ENCOUNTER — Encounter (HOSPITAL_COMMUNITY): Payer: Self-pay

## 2018-06-23 ENCOUNTER — Inpatient Hospital Stay (HOSPITAL_COMMUNITY): Payer: Medicaid Other

## 2018-06-23 ENCOUNTER — Encounter (HOSPITAL_COMMUNITY)
Admission: RE | Disposition: A | Discharge status: Home / Self Care | Payer: Self-pay | Source: Home / Self Care | Attending: Neurosurgery

## 2018-06-23 ENCOUNTER — Inpatient Hospital Stay (HOSPITAL_COMMUNITY): Payer: Medicaid Other | Admitting: Anesthesiology

## 2018-06-23 ENCOUNTER — Inpatient Hospital Stay (HOSPITAL_COMMUNITY): Payer: Medicaid Other | Admitting: Physician Assistant

## 2018-06-23 ENCOUNTER — Inpatient Hospital Stay (HOSPITAL_COMMUNITY)
Admission: RE | Admit: 2018-06-23 | Discharge: 2018-06-24 | DRG: 455 | Disposition: A | Discharge status: Home / Self Care | Payer: Medicaid Other | Attending: Neurosurgery | Admitting: Neurosurgery

## 2018-06-23 DIAGNOSIS — F419 Anxiety disorder, unspecified: Secondary | ICD-10-CM | POA: Diagnosis present

## 2018-06-23 DIAGNOSIS — Z8249 Family history of ischemic heart disease and other diseases of the circulatory system: Secondary | ICD-10-CM | POA: Diagnosis not present

## 2018-06-23 DIAGNOSIS — Z888 Allergy status to other drugs, medicaments and biological substances status: Secondary | ICD-10-CM

## 2018-06-23 DIAGNOSIS — I10 Essential (primary) hypertension: Secondary | ICD-10-CM | POA: Diagnosis present

## 2018-06-23 DIAGNOSIS — M5127 Other intervertebral disc displacement, lumbosacral region: Secondary | ICD-10-CM | POA: Diagnosis present

## 2018-06-23 DIAGNOSIS — F17210 Nicotine dependence, cigarettes, uncomplicated: Secondary | ICD-10-CM | POA: Diagnosis present

## 2018-06-23 DIAGNOSIS — Z880 Allergy status to penicillin: Secondary | ICD-10-CM | POA: Diagnosis not present

## 2018-06-23 DIAGNOSIS — M5116 Intervertebral disc disorders with radiculopathy, lumbar region: Secondary | ICD-10-CM | POA: Diagnosis not present

## 2018-06-23 DIAGNOSIS — Z981 Arthrodesis status: Secondary | ICD-10-CM | POA: Diagnosis not present

## 2018-06-23 DIAGNOSIS — M5417 Radiculopathy, lumbosacral region: Secondary | ICD-10-CM | POA: Diagnosis present

## 2018-06-23 DIAGNOSIS — M4726 Other spondylosis with radiculopathy, lumbar region: Secondary | ICD-10-CM | POA: Diagnosis not present

## 2018-06-23 DIAGNOSIS — M5136 Other intervertebral disc degeneration, lumbar region: Secondary | ICD-10-CM | POA: Diagnosis not present

## 2018-06-23 DIAGNOSIS — F418 Other specified anxiety disorders: Secondary | ICD-10-CM | POA: Diagnosis not present

## 2018-06-23 DIAGNOSIS — M4327 Fusion of spine, lumbosacral region: Secondary | ICD-10-CM | POA: Diagnosis not present

## 2018-06-23 DIAGNOSIS — K219 Gastro-esophageal reflux disease without esophagitis: Secondary | ICD-10-CM | POA: Diagnosis present

## 2018-06-23 DIAGNOSIS — Z79899 Other long term (current) drug therapy: Secondary | ICD-10-CM

## 2018-06-23 DIAGNOSIS — R000 Tachycardia, unspecified: Secondary | ICD-10-CM | POA: Diagnosis not present

## 2018-06-23 DIAGNOSIS — Z419 Encounter for procedure for purposes other than remedying health state, unspecified: Secondary | ICD-10-CM

## 2018-06-23 DIAGNOSIS — M47816 Spondylosis without myelopathy or radiculopathy, lumbar region: Secondary | ICD-10-CM | POA: Diagnosis present

## 2018-06-23 DIAGNOSIS — M5117 Intervertebral disc disorders with radiculopathy, lumbosacral region: Secondary | ICD-10-CM | POA: Diagnosis not present

## 2018-06-23 DIAGNOSIS — M5126 Other intervertebral disc displacement, lumbar region: Secondary | ICD-10-CM | POA: Diagnosis present

## 2018-06-23 DIAGNOSIS — F329 Major depressive disorder, single episode, unspecified: Secondary | ICD-10-CM | POA: Diagnosis present

## 2018-06-23 HISTORY — PX: POSTERIOR LAMINECTOMY / DECOMPRESSION LUMBAR SPINE: SUR740

## 2018-06-23 LAB — POCT PREGNANCY, URINE: Preg Test, Ur: NEGATIVE

## 2018-06-23 SURGERY — POSTERIOR LUMBAR FUSION 1 LEVEL
Anesthesia: General | Site: Spine Lumbar

## 2018-06-23 MED ORDER — ROCURONIUM BROMIDE 10 MG/ML (PF) SYRINGE
PREFILLED_SYRINGE | INTRAVENOUS | Status: DC | PRN
  Administered 2018-06-23 (×2): 10 mg via INTRAVENOUS
  Administered 2018-06-23: 50 mg via INTRAVENOUS
  Administered 2018-06-23: 30 mg via INTRAVENOUS

## 2018-06-23 MED ORDER — FENTANYL CITRATE (PF) 100 MCG/2ML IJ SOLN
INTRAMUSCULAR | Status: DC | PRN
  Administered 2018-06-23: 100 ug via INTRAVENOUS

## 2018-06-23 MED ORDER — FENTANYL CITRATE (PF) 250 MCG/5ML IJ SOLN
INTRAMUSCULAR | Status: AC
  Filled 2018-06-23: qty 5

## 2018-06-23 MED ORDER — BUPIVACAINE HCL (PF) 0.5 % IJ SOLN
INTRAMUSCULAR | Status: AC
  Filled 2018-06-23: qty 30

## 2018-06-23 MED ORDER — FENTANYL CITRATE (PF) 100 MCG/2ML IJ SOLN
INTRAMUSCULAR | Status: AC
  Filled 2018-06-23: qty 2

## 2018-06-23 MED ORDER — FLEET ENEMA 7-19 GM/118ML RE ENEM: 1.0000 | ENEMA | Freq: Once | RECTAL | Status: DC | PRN

## 2018-06-23 MED ORDER — PANTOPRAZOLE SODIUM 40 MG PO TBEC
80.0000 mg | DELAYED_RELEASE_TABLET | Freq: Every day | ORAL | Status: DC
  Administered 2018-06-24: 08:00:00 80 mg via ORAL
  Filled 2018-06-23: qty 2

## 2018-06-23 MED ORDER — ALUM & MAG HYDROXIDE-SIMETH 200-200-20 MG/5ML PO SUSP: 30.0000 mL | Freq: Four times a day (QID) | ORAL | Status: DC | PRN

## 2018-06-23 MED ORDER — THROMBIN 20000 UNITS EX SOLR
CUTANEOUS | Status: DC | PRN
  Administered 2018-06-23: 20 mL via TOPICAL

## 2018-06-23 MED ORDER — ACETAMINOPHEN 10 MG/ML IV SOLN
INTRAVENOUS | Status: AC
  Filled 2018-06-23: qty 100

## 2018-06-23 MED ORDER — PROPOFOL 10 MG/ML IV BOLUS
INTRAVENOUS | Status: AC
  Filled 2018-06-23: qty 40

## 2018-06-23 MED ORDER — BISACODYL 10 MG RE SUPP: 10.0000 mg | Freq: Every day | RECTAL | Status: DC | PRN

## 2018-06-23 MED ORDER — ALBUTEROL SULFATE HFA 108 (90 BASE) MCG/ACT IN AERS: 2.0000 | INHALATION_SPRAY | Freq: Four times a day (QID) | RESPIRATORY_TRACT | Status: DC | PRN

## 2018-06-23 MED ORDER — LACTATED RINGERS IV SOLN
INTRAVENOUS | Status: DC
  Administered 2018-06-23 (×2): via INTRAVENOUS

## 2018-06-23 MED ORDER — KCL IN DEXTROSE-NACL 40-5-0.45 MEQ/L-%-% IV SOLN
INTRAVENOUS | Status: DC
  Filled 2018-06-23: qty 1000

## 2018-06-23 MED ORDER — HYDROCODONE-ACETAMINOPHEN 5-325 MG PO TABS
ORAL_TABLET | ORAL | Status: AC
  Filled 2018-06-23: qty 2

## 2018-06-23 MED ORDER — SUCCINYLCHOLINE CHLORIDE 200 MG/10ML IV SOSY
PREFILLED_SYRINGE | INTRAVENOUS | Status: DC | PRN
  Administered 2018-06-23: 120 mg via INTRAVENOUS

## 2018-06-23 MED ORDER — DULOXETINE HCL 60 MG PO CPEP
60.0000 mg | ORAL_CAPSULE | Freq: Every day | ORAL | Status: DC
  Administered 2018-06-24: 60 mg via ORAL
  Filled 2018-06-23: qty 1

## 2018-06-23 MED ORDER — HYDROCODONE-ACETAMINOPHEN 5-325 MG PO TABS
1.0000 | ORAL_TABLET | ORAL | Status: DC | PRN
  Administered 2018-06-23 – 2018-06-24 (×5): 2 via ORAL
  Filled 2018-06-23 (×5): qty 2

## 2018-06-23 MED ORDER — LIDOCAINE-EPINEPHRINE 1 %-1:100000 IJ SOLN
INTRAMUSCULAR | Status: DC | PRN
  Administered 2018-06-23: 15 mL

## 2018-06-23 MED ORDER — THROMBIN 20000 UNITS EX KIT
PACK | CUTANEOUS | Status: AC
  Filled 2018-06-23: qty 1

## 2018-06-23 MED ORDER — ACETAMINOPHEN 650 MG RE SUPP: 650.0000 mg | RECTAL | Status: DC | PRN

## 2018-06-23 MED ORDER — SODIUM CHLORIDE 0.9% FLUSH: 3.0000 mL | INTRAVENOUS | Status: DC | PRN

## 2018-06-23 MED ORDER — FAMOTIDINE 20 MG PO TABS
20.0000 mg | ORAL_TABLET | Freq: Two times a day (BID) | ORAL | Status: DC
  Administered 2018-06-23 – 2018-06-24 (×2): 20 mg via ORAL
  Filled 2018-06-23 (×2): qty 1

## 2018-06-23 MED ORDER — FENTANYL CITRATE (PF) 100 MCG/2ML IJ SOLN
25.0000 ug | INTRAMUSCULAR | Status: DC | PRN
  Administered 2018-06-23: 50 ug via INTRAVENOUS

## 2018-06-23 MED ORDER — BUPIVACAINE HCL (PF) 0.5 % IJ SOLN
INTRAMUSCULAR | Status: DC | PRN
  Administered 2018-06-23: 15 mL

## 2018-06-23 MED ORDER — TIZANIDINE HCL 2 MG PO TABS
2.0000 mg | ORAL_TABLET | Freq: Two times a day (BID) | ORAL | Status: DC
  Administered 2018-06-23 – 2018-06-24 (×2): 2 mg via ORAL
  Filled 2018-06-23 (×2): qty 1

## 2018-06-23 MED ORDER — PHENOL 1.4 % MT LIQD: 1.0000 | OROMUCOSAL | Status: DC | PRN

## 2018-06-23 MED ORDER — SUCCINYLCHOLINE CHLORIDE 200 MG/10ML IV SOSY
PREFILLED_SYRINGE | INTRAVENOUS | Status: AC
  Filled 2018-06-23: qty 10

## 2018-06-23 MED ORDER — LIDOCAINE 2% (20 MG/ML) 5 ML SYRINGE
INTRAMUSCULAR | Status: AC
  Filled 2018-06-23: qty 5

## 2018-06-23 MED ORDER — LIDOCAINE 2% (20 MG/ML) 5 ML SYRINGE
INTRAMUSCULAR | Status: DC | PRN
  Administered 2018-06-23: 100 mg via INTRAVENOUS

## 2018-06-23 MED ORDER — MIDAZOLAM HCL 2 MG/2ML IJ SOLN
INTRAMUSCULAR | Status: AC
  Filled 2018-06-23: qty 2

## 2018-06-23 MED ORDER — MORPHINE SULFATE (PF) 4 MG/ML IV SOLN
4.0000 mg | INTRAVENOUS | Status: DC | PRN
  Administered 2018-06-24: 05:00:00 4 mg via INTRAMUSCULAR
  Filled 2018-06-23: qty 1

## 2018-06-23 MED ORDER — MAGNESIUM HYDROXIDE 400 MG/5ML PO SUSP: 30.0000 mL | Freq: Every day | ORAL | Status: DC | PRN

## 2018-06-23 MED ORDER — VANCOMYCIN HCL IN DEXTROSE 1-5 GM/200ML-% IV SOLN
1000.0000 mg | INTRAVENOUS | Status: AC
  Administered 2018-06-23: 1000 mg via INTRAVENOUS

## 2018-06-23 MED ORDER — THROMBIN 5000 UNITS EX SOLR
OROMUCOSAL | Status: DC | PRN
  Administered 2018-06-23: 5 mL via TOPICAL

## 2018-06-23 MED ORDER — ACETAMINOPHEN 10 MG/ML IV SOLN
INTRAVENOUS | Status: DC | PRN
  Administered 2018-06-23: 1000 mg via INTRAVENOUS

## 2018-06-23 MED ORDER — ONDANSETRON HCL 4 MG/2ML IJ SOLN
INTRAMUSCULAR | Status: AC
  Filled 2018-06-23: qty 2

## 2018-06-23 MED ORDER — ACETAMINOPHEN 325 MG PO TABS: 650.0000 mg | ORAL_TABLET | ORAL | Status: DC | PRN

## 2018-06-23 MED ORDER — LISINOPRIL 20 MG PO TABS
20.0000 mg | ORAL_TABLET | Freq: Every day | ORAL | Status: DC
  Administered 2018-06-23: 20:00:00 20 mg via ORAL
  Filled 2018-06-23: qty 1

## 2018-06-23 MED ORDER — SODIUM CHLORIDE 0.9% FLUSH
3.0000 mL | Freq: Two times a day (BID) | INTRAVENOUS | Status: DC
  Administered 2018-06-23: 22:00:00 3 mL via INTRAVENOUS

## 2018-06-23 MED ORDER — SODIUM CHLORIDE 0.9 % IV SOLN: 250.0000 mL | INTRAVENOUS | Status: DC

## 2018-06-23 MED ORDER — FENTANYL CITRATE (PF) 100 MCG/2ML IJ SOLN
INTRAMUSCULAR | Status: DC | PRN
  Administered 2018-06-23: 200 ug via INTRAVENOUS
  Administered 2018-06-23: 50 ug via INTRAVENOUS

## 2018-06-23 MED ORDER — ROCURONIUM BROMIDE 50 MG/5ML IV SOSY
PREFILLED_SYRINGE | INTRAVENOUS | Status: AC
  Filled 2018-06-23: qty 10

## 2018-06-23 MED ORDER — 0.9 % SODIUM CHLORIDE (POUR BTL) OPTIME
TOPICAL | Status: DC | PRN
  Administered 2018-06-23: 1000 mL

## 2018-06-23 MED ORDER — LIDOCAINE-EPINEPHRINE 1 %-1:100000 IJ SOLN
INTRAMUSCULAR | Status: AC
  Filled 2018-06-23: qty 1

## 2018-06-23 MED ORDER — ALBUTEROL SULFATE (2.5 MG/3ML) 0.083% IN NEBU: 2.5000 mg | INHALATION_SOLUTION | Freq: Four times a day (QID) | RESPIRATORY_TRACT | Status: DC | PRN

## 2018-06-23 MED ORDER — ONDANSETRON HCL 4 MG/2ML IJ SOLN: 4.0000 mg | Freq: Four times a day (QID) | INTRAMUSCULAR | Status: DC | PRN

## 2018-06-23 MED ORDER — PROPOFOL 10 MG/ML IV BOLUS
INTRAVENOUS | Status: DC | PRN
  Administered 2018-06-23: 170 mg via INTRAVENOUS

## 2018-06-23 MED ORDER — MENTHOL 3 MG MT LOZG: 1.0000 | LOZENGE | OROMUCOSAL | Status: DC | PRN

## 2018-06-23 MED ORDER — THROMBIN 5000 UNITS EX SOLR
CUTANEOUS | Status: AC
  Filled 2018-06-23: qty 5000

## 2018-06-23 MED ORDER — HYDROXYZINE HCL 25 MG PO TABS: 50.0000 mg | ORAL_TABLET | ORAL | Status: DC | PRN

## 2018-06-23 MED ORDER — CHLORHEXIDINE GLUCONATE CLOTH 2 % EX PADS: 6.0000 | MEDICATED_PAD | Freq: Once | CUTANEOUS | Status: DC

## 2018-06-23 MED ORDER — PHENYLEPHRINE 40 MCG/ML (10ML) SYRINGE FOR IV PUSH (FOR BLOOD PRESSURE SUPPORT)
PREFILLED_SYRINGE | INTRAVENOUS | Status: DC | PRN
  Administered 2018-06-23 (×2): 80 ug via INTRAVENOUS
  Administered 2018-06-23: 40 ug via INTRAVENOUS
  Administered 2018-06-23: 80 ug via INTRAVENOUS

## 2018-06-23 MED ORDER — DEXAMETHASONE SODIUM PHOSPHATE 10 MG/ML IJ SOLN
INTRAMUSCULAR | Status: DC | PRN
  Administered 2018-06-23: 10 mg via INTRAVENOUS

## 2018-06-23 MED ORDER — HYDROXYZINE HCL 50 MG/ML IM SOLN
50.0000 mg | INTRAMUSCULAR | Status: DC | PRN
  Filled 2018-06-23: qty 1

## 2018-06-23 MED ORDER — ONDANSETRON HCL 4 MG PO TABS: 4.0000 mg | ORAL_TABLET | Freq: Four times a day (QID) | ORAL | Status: DC | PRN

## 2018-06-23 MED ORDER — GENTAMICIN IN SALINE 1.6-0.9 MG/ML-% IV SOLN
80.0000 mg | INTRAVENOUS | Status: AC
  Administered 2018-06-23: 80 mg via INTRAVENOUS
  Filled 2018-06-23: qty 50

## 2018-06-23 MED ORDER — CYCLOBENZAPRINE HCL 5 MG PO TABS
5.0000 mg | ORAL_TABLET | Freq: Three times a day (TID) | ORAL | Status: DC | PRN
  Administered 2018-06-23: 10 mg via ORAL
  Administered 2018-06-23: 5 mg via ORAL
  Filled 2018-06-23 (×2): qty 2

## 2018-06-23 MED ORDER — CYCLOBENZAPRINE HCL 10 MG PO TABS
ORAL_TABLET | ORAL | Status: AC
  Filled 2018-06-23: qty 1

## 2018-06-23 MED ORDER — ONDANSETRON HCL 4 MG/2ML IJ SOLN
INTRAMUSCULAR | Status: DC | PRN
  Administered 2018-06-23: 4 mg via INTRAVENOUS

## 2018-06-23 MED ORDER — ATENOLOL 25 MG PO TABS
12.5000 mg | ORAL_TABLET | Freq: Every day | ORAL | Status: DC
  Administered 2018-06-23: 20:00:00 12.5 mg via ORAL
  Filled 2018-06-23: qty 1

## 2018-06-23 MED ORDER — DEXAMETHASONE SODIUM PHOSPHATE 10 MG/ML IJ SOLN
INTRAMUSCULAR | Status: AC
  Filled 2018-06-23: qty 1

## 2018-06-23 MED ORDER — SODIUM CHLORIDE 0.9 % IV SOLN
INTRAVENOUS | Status: DC | PRN
  Administered 2018-06-23: 500 mL

## 2018-06-23 MED ORDER — MIDAZOLAM HCL 2 MG/2ML IJ SOLN
INTRAMUSCULAR | Status: DC | PRN
  Administered 2018-06-23: 2 mg via INTRAVENOUS

## 2018-06-23 SURGICAL SUPPLY — 78 items
ADH SKN CLS APL DERMABOND .7 (GAUZE/BANDAGES/DRESSINGS) ×1
APL SKNCLS STERI-STRIP NONHPOA (GAUZE/BANDAGES/DRESSINGS) ×1
BAG DECANTER FOR FLEXI CONT (MISCELLANEOUS) ×2 IMPLANT
BENZOIN TINCTURE PRP APPL 2/3 (GAUZE/BANDAGES/DRESSINGS) ×2 IMPLANT
BLADE CLIPPER SURG (BLADE) IMPLANT
BUR ACRON 5.0MM COATED (BURR) ×2 IMPLANT
BUR MATCHSTICK NEURO 3.0 LAGG (BURR) ×2 IMPLANT
CAGE POST LUM 6D 9X23X9 (Cage) ×2 IMPLANT
CANISTER SUCT 3000ML PPV (MISCELLANEOUS) ×2 IMPLANT
CAP LCK SPNE (Orthopedic Implant) ×4 IMPLANT
CAP LOCK SPINE RADIUS (Orthopedic Implant) IMPLANT
CAP LOCKING (Orthopedic Implant) ×8 IMPLANT
CARTRIDGE OIL MAESTRO DRILL (MISCELLANEOUS) ×1 IMPLANT
CONT SPEC 4OZ CLIKSEAL STRL BL (MISCELLANEOUS) ×2 IMPLANT
COVER BACK TABLE 60X90IN (DRAPES) ×2 IMPLANT
COVER WAND RF STERILE (DRAPES) ×2 IMPLANT
DECANTER SPIKE VIAL GLASS SM (MISCELLANEOUS) IMPLANT
DERMABOND ADVANCED (GAUZE/BANDAGES/DRESSINGS) ×1
DERMABOND ADVANCED .7 DNX12 (GAUZE/BANDAGES/DRESSINGS) ×1 IMPLANT
DIFFUSER DRILL AIR PNEUMATIC (MISCELLANEOUS) ×2 IMPLANT
DRAPE C-ARM 42X72 X-RAY (DRAPES) ×3 IMPLANT
DRAPE C-ARMOR (DRAPES) ×1 IMPLANT
DRAPE HALF SHEET 40X57 (DRAPES) IMPLANT
DRAPE LAPAROTOMY 100X72X124 (DRAPES) ×2 IMPLANT
DRAPE POUCH INSTRU U-SHP 10X18 (DRAPES) ×2 IMPLANT
ELECT REM PT RETURN 9FT ADLT (ELECTROSURGICAL) ×2
ELECTRODE REM PT RTRN 9FT ADLT (ELECTROSURGICAL) ×1 IMPLANT
GAUZE 4X4 16PLY RFD (DISPOSABLE) IMPLANT
GAUZE SPONGE 4X4 12PLY STRL (GAUZE/BANDAGES/DRESSINGS) ×2 IMPLANT
GAUZE SPONGE 4X4 12PLY STRL LF (GAUZE/BANDAGES/DRESSINGS) ×1 IMPLANT
GLOVE BIO SURGEON STRL SZ8 (GLOVE) ×2 IMPLANT
GLOVE BIOGEL PI IND STRL 7.0 (GLOVE) IMPLANT
GLOVE BIOGEL PI IND STRL 8 (GLOVE) ×2 IMPLANT
GLOVE BIOGEL PI IND STRL 8.5 (GLOVE) IMPLANT
GLOVE BIOGEL PI INDICATOR 7.0 (GLOVE) ×1
GLOVE BIOGEL PI INDICATOR 8 (GLOVE) ×6
GLOVE BIOGEL PI INDICATOR 8.5 (GLOVE) ×1
GLOVE ECLIPSE 7.5 STRL STRAW (GLOVE) ×7 IMPLANT
GOWN STRL REUS W/ TWL LRG LVL3 (GOWN DISPOSABLE) IMPLANT
GOWN STRL REUS W/ TWL XL LVL3 (GOWN DISPOSABLE) ×3 IMPLANT
GOWN STRL REUS W/TWL 2XL LVL3 (GOWN DISPOSABLE) ×2 IMPLANT
GOWN STRL REUS W/TWL LRG LVL3 (GOWN DISPOSABLE)
GOWN STRL REUS W/TWL XL LVL3 (GOWN DISPOSABLE) ×6
KIT BASIN OR (CUSTOM PROCEDURE TRAY) ×2 IMPLANT
KIT INFUSE SMALL (Orthopedic Implant) ×2 IMPLANT
KIT TURNOVER KIT B (KITS) ×2 IMPLANT
NDL ASP BONE MRW 8GX15 (NEEDLE) IMPLANT
NDL HYPO 18GX1.5 BLUNT FILL (NEEDLE) IMPLANT
NDL SPNL 22GX3.5 QUINCKE BK (NEEDLE) ×1 IMPLANT
NEEDLE ASP BONE MRW 8GX15 (NEEDLE) ×2 IMPLANT
NEEDLE HYPO 18GX1.5 BLUNT FILL (NEEDLE) ×2 IMPLANT
NEEDLE SPNL 18GX3.5 QUINCKE PK (NEEDLE) IMPLANT
NEEDLE SPNL 20GX3.5 QUINCKE YW (NEEDLE) ×2 IMPLANT
NEEDLE SPNL 22GX3.5 QUINCKE BK (NEEDLE) ×2 IMPLANT
NS IRRIG 1000ML POUR BTL (IV SOLUTION) ×2 IMPLANT
OIL CARTRIDGE MAESTRO DRILL (MISCELLANEOUS) ×2
PACK LAMINECTOMY NEURO (CUSTOM PROCEDURE TRAY) ×2 IMPLANT
PAD ARMBOARD 7.5X6 YLW CONV (MISCELLANEOUS) ×8 IMPLANT
PATTIES SURGICAL .5 X.5 (GAUZE/BANDAGES/DRESSINGS) IMPLANT
PATTIES SURGICAL .5 X1 (DISPOSABLE) IMPLANT
PATTIES SURGICAL 1X1 (DISPOSABLE) IMPLANT
ROD 5.5X30MM (Rod) ×2 IMPLANT
SCREW 5.75X30MM (Screw) ×8 IMPLANT
SPONGE LAP 4X18 RFD (DISPOSABLE) IMPLANT
SPONGE NEURO XRAY DETECT 1X3 (DISPOSABLE) IMPLANT
SPONGE SURGIFOAM ABS GEL 100 (HEMOSTASIS) ×2 IMPLANT
STRIP BIOACTIVE VITOSS 25X100X (Neuro Prosthesis/Implant) ×2 IMPLANT
SUT VIC AB 1 CT1 18XBRD ANBCTR (SUTURE) ×2 IMPLANT
SUT VIC AB 1 CT1 8-18 (SUTURE) ×4
SUT VIC AB 2-0 CP2 18 (SUTURE) ×4 IMPLANT
SYR 3ML LL SCALE MARK (SYRINGE) ×1 IMPLANT
SYR BULB IRRIGATION 50ML (SYRINGE) ×2 IMPLANT
SYR CONTROL 10ML LL (SYRINGE) ×2 IMPLANT
TAPE CLOTH SURG 4X10 WHT LF (GAUZE/BANDAGES/DRESSINGS) ×1 IMPLANT
TOWEL GREEN STERILE (TOWEL DISPOSABLE) ×2 IMPLANT
TOWEL GREEN STERILE FF (TOWEL DISPOSABLE) ×2 IMPLANT
TRAY FOLEY MTR SLVR 16FR STAT (SET/KITS/TRAYS/PACK) ×2 IMPLANT
WATER STERILE IRR 1000ML POUR (IV SOLUTION) ×2 IMPLANT

## 2018-06-23 NOTE — Transfer of Care (Signed)
Immediate Anesthesia Transfer of Care Note  Patient: Melissa Walker  Procedure(s) Performed: Lumbar Five Sacral One Decompression, posterior lumbar interbody fusion, posterolateral arthrodesis (N/A Spine Lumbar)  Patient Location: PACU  Anesthesia Type:General  Level of Consciousness: awake, alert  and oriented  Airway & Oxygen Therapy: Patient Spontanous Breathing and Patient connected to face mask oxygen  Post-op Assessment: Report given to RN and Post -op Vital signs reviewed and stable  Post vital signs: Reviewed and stable  Last Vitals:  Vitals Value Taken Time  BP 110/58 06/23/2018 12:10 PM  Temp 36.7 C 06/23/2018 12:00 PM  Pulse 108 06/23/2018 12:13 PM  Resp 20 06/23/2018 12:13 PM  SpO2 96 % 06/23/2018 12:13 PM  Vitals shown include unvalidated device data.  Last Pain:  Vitals:   06/23/18 0604  TempSrc:   PainSc: 10-Worst pain ever      Patients Stated Pain Goal: 2 (50/15/86 8257)  Complications: No apparent anesthesia complications

## 2018-06-23 NOTE — Anesthesia Postprocedure Evaluation (Signed)
Anesthesia Post Note  Patient: ILINE BUCHINGER  Procedure(s) Performed: Lumbar Five Sacral One Decompression, posterior lumbar interbody fusion, posterolateral arthrodesis (N/A Spine Lumbar)     Patient location during evaluation: PACU Anesthesia Type: General Level of consciousness: awake Pain management: pain level controlled Vital Signs Assessment: post-procedure vital signs reviewed and stable Respiratory status: spontaneous breathing Cardiovascular status: stable Postop Assessment: no apparent nausea or vomiting Anesthetic complications: no    Last Vitals:  Vitals:   06/23/18 1200 06/23/18 1210  BP: 116/65 (!) 110/58  Pulse:  (!) 106  Resp:  17  Temp: 36.7 C   SpO2:  98%    Last Pain:  Vitals:   06/23/18 0604  TempSrc:   PainSc: 10-Worst pain ever                 Tomoko Sandra

## 2018-06-23 NOTE — Anesthesia Procedure Notes (Signed)
Procedure Name: Intubation Date/Time: 06/23/2018 7:39 AM Performed by: Leonor Liv, CRNA Pre-anesthesia Checklist: Patient identified, Emergency Drugs available, Suction available and Patient being monitored Patient Re-evaluated:Patient Re-evaluated prior to induction Oxygen Delivery Method: Circle System Utilized Preoxygenation: Pre-oxygenation with 100% oxygen Induction Type: IV induction and Rapid sequence Laryngoscope Size: Mac and 3 Grade View: Grade I Tube type: Oral Tube size: 7.0 mm Number of attempts: 1 Airway Equipment and Method: Stylet and Oral airway Placement Confirmation: ETT inserted through vocal cords under direct vision,  positive ETCO2 and breath sounds checked- equal and bilateral Secured at: 21 cm Tube secured with: Tape Dental Injury: Teeth and Oropharynx as per pre-operative assessment

## 2018-06-23 NOTE — Op Note (Signed)
06/23/2018  11:42 AM  PATIENT:  Melissa Walker  38 y.o. female  PRE-OPERATIVE DIAGNOSIS: Recurrent right L5-S1 lumbar disc herniation, lumbar spondylosis, lumbar degenerative disease, lumbar radiculopathy  POST-OPERATIVE DIAGNOSIS:  Recurrent right L5-S1 lumbar disc herniation, lumbar spondylosis, lumbar degenerative disease, lumbar radiculopathy  PROCEDURE:  Procedure(s): Bilateral L5-S1 lumbar decompression including laminectomy, facetectomy, and foraminotomies, for decompression of the stenotic compression of the exiting L5 and S1 nerve roots bilaterally with decompression beyond that required for interbody arthrodesis; bilateral L5-S1 discectomy; bilateral L5-S1 posterior lumbar interbody arthrodesis with Tritanium interbody implants, Vitoss BA with bone marrow aspirate, and infuse; bilateral L5-S1 posterior lateral arthrodesis with nonsegmental radius posterior instrumentation, Vitoss BA with bone marrow aspirate, and infuse  SURGEON: Jovita Gamma, MD  ASSISTANTS: Kary Kos, MD  ANESTHESIA:   general  EBL:  Total I/O In: 1000 [I.V.:1000] Out: 955 [Urine:855; Blood:100]  BLOOD ADMINISTERED:none  CELL SAVER GIVEN: Cell Saver technician felt that there was insufficient blood loss to process the collected blood  COUNT:  Correct per nursing staff  DICTATION: Patient is brought to the operating room placed under general endotracheal anesthesia. The patient was turned to prone position the lumbar region was prepped with Betadine soap and solution and draped in a sterile fashion. The midline was infiltrated with local anesthesia with epinephrine. A localizing x-ray was taken and the L5-S1 level localized.  A midline incision was made through the previous midline incision, and extended rostrally and caudally.  It was carried down through the subcutaneous tissue.  Bipolar cautery and electrocautery were used to maintain hemostasis. Dissection was carried down to the lumbar fascia. The  fascia was incised bilaterally and the paraspinal muscles were dissected from the spinous process and lamina in a subperiosteal fashion. Another x-ray was taken for localization and the L5-S1 level was localized. Dissection was then carried out laterally over the facet complex and the transverse processes of L5 and the ala of S1 were exposed and decorticated.  We then proceeded with the decompression.  The previous laminotomy on the right side was exposed, and the laminotomy extended rostrally and caudally using the high-speed drill and Kerrison punches.  Similarly a laminotomy was performed on the left side using the high-speed drill and Kerrison punches.  Dissection was carried out laterally including facetectomy and foraminotomies with decompression of the stenotic compression of the exiting L5 and S1 nerve roots.  Carefully exposed the large recurrent right L5-S1 lumbar disc condition.  Free fragment disc herniation and spondylitic disc herniation was progressively removed, decompressing the thecal sac and exiting nerve roots.  Once the decompression stenotic compression of the thecal sac and exiting nerve roots was completed we proceeded with the posterior lumbar interbody arthrodesis.  A thorough discectomy was performed using pituitary rongeurs and curettes. Once the discectomy was completed we began to prepare the endplate surfaces removing the cartilaginous endplates surfaces. We then measured the height of the intervertebral disc space. We selected a 9 x 23 x 6 Tritanium interbody implants.  The C-arm fluoroscope was then draped and brought in the field and we identified the pedicle entry points bilaterally at the L5 and S1 levels. Each of the 4 pedicles was probed, we aspirated bone marrow aspirate from the vertebral bodies, this was injected over two 10 cc strips of Vitoss BA. Then each of the pedicles was examined with the ball probe good bony surfaces were found and no bony cuts were found. Each  of the pedicles was then tapped with a 5.25 mm tap,  again examined with the ball probe good threading was found and no bony cuts were found. We then placed 5.75 by 30 millimeter screws bilaterally at each level.  We then packed the Tritanium interbody implants with Vitoss BA with bone marrow aspirate and infuse, and then placed the first implant and on the right side, carefully retracting the thecal sac and nerve root medially. We then went back to the left side and packed the midline with additional Vitoss BA with bone marrow aspirate and infuse, and then placed a second implant on the left side again retracting the thecal sac and nerve root medially. Additional Vitoss BA with bone marrow aspirate and infuse was packed lateral to the implants.  We then packed the lateral gutter over the transverse processes and ala, and intertransverse space with Vitoss BA with bone marrow aspirate and infuse. We then selected 30 millimeter pre-lordosed rods, they were placed within the screw heads and secured with locking caps.  Once all 4 locking caps were placed final tightening was performed against a counter torque.  The wound had been irrigated multiple times during the procedure with saline solution and bacitracin solution, good hemostasis was established with a combination of bipolar cautery and Gelfoam with thrombin.  The Gelfoam was removed, and a thin layer of Surgifoam applied.  Once good hemostasis was confirmed we proceeded with closure.  Prior to closure we instilled 2 cc of fentanyl in the epidural space.  The deep fascia and Scarpa's fascia were closed in separate layers with interrupted undyed 1 Vicryl sutures.  The subcutaneous and subcuticular closed with interrupted inverted 2-0 undyed Vicryl sutures and the skin edges were approximated with Dermabond.  A dressing of sterile gauze and Hypafix was applied.  Following surgery the patient was turned back to the supine position to be reversed and the  anesthetic extubated and transferred to the recovery room for further care.  PLAN OF CARE: Admit to inpatient   PATIENT DISPOSITION:  PACU - hemodynamically stable.   Delay start of Pharmacological VTE agent (>24hrs) due to surgical blood loss or risk of bleeding:  yes

## 2018-06-23 NOTE — H&P (Signed)
Subjective: Patient is a 38 y.o. right-handed white female who is admitted for treatment of large recurrent right L5-S1 lumbar disc herniation.  Patient is 4 years status post a right L5-S1 lumbar laminotomy and microdiscectomy.  She is developed recurrent disabling right lumbar radicular pain.  She has not responded to nonsurgical measures.  She is admitted now for a bilateral L5-S1 lumbar decompression, removal of the recurrent disc herniation, bilateral L5-S1 posterior lumbar interbody arthrodesis with interbody implants and bone graft, and bilateral L5-S1 posterior lateral arthrodesis with posterior instrumentation and bone graft.   Patient Active Problem List   Diagnosis Date Noted  . Cough productive of yellow sputum 03/22/2018  . Inappropriate sinus tachycardia 04/21/2017  . Hypertension 08/10/2016  . Night sweats 03/30/2016  . Thyroid function study abnormality 03/30/2016  . Long-term current use of opiate analgesic 02/27/2016  . Adult acne 12/04/2014  . Anxiety and depression 10/26/2014  . Healthcare maintenance 10/26/2014  . Underweight 10/26/2014  . HNP (herniated nucleus pulposus), lumbar 07/23/2014  . Lumbosacral radiculopathy 04/13/2008  . TOBACCO ABUSE 02/02/2006   Past Medical History:  Diagnosis Date  . Abnormal uterine bleeding 02/04/2012   Occurred 01/12/12-01/15/12. Evaluated in [facility]U and all labs, exam, and pelvic u/s WNL. Given rx for ibuprofen and told to follow up as otupatient.    . Anxiety    Pt states she's on cymbalta "to calm me down"  . Bronchitis    APPROX 6 MTHS AGO  . Cholelithiasis 10/26/2014  . Chronic back pain   . Dysrhythmia    RACING HEART - SHE SAID MEDICATION RELATED  . GERD (gastroesophageal reflux disease)    SCHEDULED TO SEE GI MD IN APRIL 2020  . Hypertension   . Neuromuscular disorder ([place])   . Tobacco abuse     Past Surgical History:  Procedure Laterality Date  . DENTAL REHABILITATION     full set dentures  . LUMBAR  LAMINECTOMY/DECOMPRESSION MICRODISCECTOMY Right 07/23/2014   Procedure: Right Lumbar five-Sacral one laminotomy and microdiskectomy;  Surgeon: Jovita Gamma, MD;  Location: [facility];  Service: Neurosurgery;  Laterality: Right;  Right Lumbar five-Sacral one laminotomy and microdiskectomy  . Multiple epidural steroid injections in back     Over past 2 years  . TUBAL LIGATION Bilateral 05/11/2013   Procedure: POST PARTUM TUBAL LIGATION;  Surgeon: Osborne Oman, MD;  Location: [facility];  Service: Gynecology;  Laterality: Bilateral;    Medications Prior to Admission  Medication Sig Dispense Refill Last Dose  . atenolol (TENORMIN) 25 MG tablet TAKE 1/2 TABLET(12.5 MG) BY MOUTH DAILY (Patient taking differently: Take 12.5 mg by mouth daily. ) 45 tablet 1 06/22/2018 at Unknown time  . DULoxetine (CYMBALTA) 60 MG capsule Take 1 capsule (60 mg total) by mouth daily. 30 capsule 5 06/23/2018 at 0500  . famotidine (PEPCID) 20 MG tablet Take 1 tablet (20 mg total) by mouth 2 (two) times daily. 60 tablet 3 06/23/2018 at 0600  . HYDROcodone-acetaminophen (NORCO) 5-325 MG tablet Take 1 tablet by mouth every 8 (eight) hours as needed for moderate pain or severe pain. 90 tablet 0 06/22/2018 at Unknown time  . lisinopril (PRINIVIL,ZESTRIL) 20 MG tablet TAKE 1 TABLET(20 MG) BY MOUTH DAILY (Patient taking differently: Take 20 mg by mouth daily. ) 90 tablet 3 06/22/2018 at Unknown time  . omeprazole (PRILOSEC) 40 MG capsule Take 1 capsule (40 mg total) by mouth daily. 90 capsule 3 06/23/2018 at 0600  . tiZANidine (ZANAFLEX) 2 MG tablet Take 2 mg by  mouth 2 (two) times daily.   06/22/2018 at Unknown time  . fluticasone (FLONASE) 50 MCG/ACT nasal spray Place 1 spray into both nostrils daily. (Patient taking differently: Place 1 spray into both nostrils daily as needed for allergies. ) 16 g 2 More than a month at Unknown time  . PROVENTIL HFA 108 (90 Base) MCG/ACT inhaler INHALE 2 PUFFS INTO THE LUNGS EVERY 6 HOURS AS NEEDED  FOR WHEEZING OR SHORTNESS OF BREATH (Patient taking differently: Inhale 2 puffs into the lungs every 6 (six) hours as needed for wheezing or shortness of breath. ) 6.7 g 0 More than a month at Unknown time   Allergies  Allergen Reactions  . Mobic [Meloxicam] Other (See Comments)    Stomach bleeding   . Penicillins Other (See Comments)    uncle highly allergic so was told whole family should not take it Did it involve swelling of the face/tongue/throat, SOB, or low BP? Unknown Did it involve sudden or severe rash/hives, skin peeling, or any reaction on the inside of your mouth or nose? Unknown Did you need to seek medical attention at a hospital or doctor's office? Unknown When did it last happen?Never taken before If all above answers are "NO", may proceed with cephalosporin use.   . Nicotine Rash    Transdermal Patch    Social History   Tobacco Use  . Smoking status: Current Every Day Smoker    Packs/day: 1.00    Years: 18.00    Pack years: 18.00    Types: Cigarettes  . Smokeless tobacco: Never Used  . Tobacco comment: sometimes less. Cutting back  Substance Use Topics  . Alcohol use: Yes    Alcohol/week: 0.0 standard drinks    Comment: occasional    Family History  Problem Relation Age of Onset  . Cancer Father        brain tumor   . Eclampsia Maternal Grandmother   . Heart attack Maternal Grandmother   . Cancer Maternal Grandmother        lung  . Heart disease Maternal Grandmother   . Hyperlipidemia Maternal Grandmother   . Hypertension Maternal Grandmother      Review of Systems Pertinent items noted in HPI and remainder of comprehensive ROS otherwise negative.  Objective: Vital signs in last 24 hours: Temp:  [98 F (36.7 C)-98.3 F (36.8 C)] 98.3 F (36.8 C) (04/23 0553) Pulse Rate:  [87-99] 87 (04/23 0553) Resp:  [18] 18 (04/23 0553) BP: (152-153)/(82-84) 153/84 (04/23 0553) SpO2:  [100 %] 100 % (04/23 0553) Weight:  [49.6 kg] 49.6 kg (04/23  0553)  EXAM: Patient is a thin white female in significant pain, but no acute distress.   Lungs are clear to auscultation , the patient has symmetrical respiratory excursion. Heart has a regular rate and rhythm normal S1 and S2 no murmur.   Abdomen is soft nontender nondistended bowel sounds are present. Extremity examination shows no clubbing cyanosis or edema. Motor examination shows 5 over 5 strength in the lower extremities including the iliopsoas quadriceps dorsiflexor extensor hallicus  longus and plantar flexor bilaterally. Sensation is intact to pinprick in the distal lower extremities. Reflexes are symmetrical bilaterally. No pathologic reflexes are present.  Gait and stance favor the right lower extremity.   Data Review:CBC    Component Value Date/Time   WBC 7.6 06/14/2018 0745   RBC 2.95 (L) 06/14/2018 0745   HGB 10.4 (L) 06/14/2018 0745   HGB 11.7 07/07/2017 1550   HCT 30.1 (L) 06/14/2018  0745   HCT 32.9 (L) 07/07/2017 1550   PLT 467.0 (H) 06/14/2018 0745   PLT 550 (H) 07/07/2017 1550   MCV 102.1 (H) 06/14/2018 0745   MCV 97 07/07/2017 1550   MCH 33.8 05/16/2018 1127   MCHC 34.5 06/14/2018 0745   RDW 14.1 06/14/2018 0745   RDW 13.0 07/07/2017 1550   LYMPHSABS 2.7 06/14/2018 0745   MONOABS 0.5 06/14/2018 0745   EOSABS 0.1 06/14/2018 0745   BASOSABS 0.1 06/14/2018 0745                          BMET    Component Value Date/Time   NA 136 06/22/2018 0844   NA 120 (L) 07/07/2017 1550   K 3.3 (L) 06/22/2018 0844   CL 102 06/22/2018 0844   CO2 25 06/22/2018 0844   GLUCOSE 94 06/22/2018 0844   BUN 6 06/22/2018 0844   BUN 6 07/07/2017 1550   CREATININE 0.72 06/22/2018 0844   CALCIUM 8.5 (L) 06/22/2018 0844   CALCIUM 9.5 02/09/2006 1033   GFRNONAA >60 06/22/2018 0844   GFRAA >60 06/22/2018 0844     Assessment/Plan: Patient with large recurrent right L5-S1 lumbar disc negation, who is admitted for lumbar decompression and stabilization.  I've discussed with the  patient the nature of his condition, the nature the surgical procedure, the typical length of surgery, hospital stay, and overall recuperation, the limitations postoperatively, and risks of surgery. I discussed risks including risks of infection, bleeding, possibly need for transfusion, the risk of nerve root dysfunction with pain, weakness, numbness, or paresthesias, the risk of dural tear and CSF leakage and possible need for further surgery, the risk of failure of the arthrodesis and possibly for further surgery, the risk of anesthetic complications including myocardial infarction, stroke, pneumonia, and death. We discussed the need for postoperative immobilization in a lumbar brace. Understanding all this the patient does wish to proceed with surgery and is admitted for such.   Hosie Spangle, MD 06/23/2018 7:16 AM

## 2018-06-24 MED ORDER — HYDROCODONE-ACETAMINOPHEN 5-325 MG PO TABS: 1.0000 | ORAL_TABLET | ORAL | 0 refills | Status: DC | PRN

## 2018-06-24 MED FILL — Sodium Chloride IV Soln 0.9%: INTRAVENOUS | Qty: 1000 | Status: AC

## 2018-06-24 MED FILL — Heparin Sodium (Porcine) Inj 1000 Unit/ML: INTRAMUSCULAR | Qty: 30 | Status: AC

## 2018-06-24 NOTE — Discharge Instructions (Signed)
°  Call Your Doctor If Any of These Occur °Redness, drainage, or swelling at the wound.  °Temperature greater than 101 degrees. °Severe pain not relieved by pain medication. °Incision starts to come apart. °Follow Up Appt °Call today for appointment in 3 weeks ([phone]) or for problems.  If you have any hardware placed in your spine, you will need an x-ray before your appointment. °

## 2018-06-24 NOTE — Progress Notes (Signed)
Patient alert and oriented, voiding adequately with no s/s of infection on incision noted. MAE's well and no c/o pain at this time. Patient discharged home per order with stated understanding of instructions given.

## 2018-06-24 NOTE — Discharge Summary (Signed)
Physician Discharge Summary  Patient ID: Melissa Walker MRN: [id number] DOB/AGE: 1980-06-08 38 y.o.  Admit date: 06/23/2018 Discharge date: 06/24/2018  Admission Diagnoses: Recurrent right L5-S1 lumbar disc herniation, lumbar spondylosis, lumbar degenerative disease, lumbar radiculopathy   Discharge Diagnoses:  Recurrent right L5-S1 lumbar disc herniation, lumbar spondylosis, lumbar degenerative disease, lumbar radiculopathy Active Problems:   Lumbar disc herniation   Discharged Condition: good  Hospital Course: Patient was admitted, underwent a bilateral L5-S1 lumbar decompression including discectomy and stabilization.  Postoperatively she has done well.  She is up and ambulating actively in the halls.  She is voiding well.  Her dressing was removed, the incision is healing nicely.  There is no erythema, swelling, ecchymosis, or drainage.  She has mild incisional discomfort that is been managed with hydrocodone and tizanidine.  She is being discharged home with instructions regarding wound care and activities.  She is scheduled for follow-up with me in the office in 3 weeks.  Discharge Exam: Blood pressure (!) 145/83, pulse 87, temperature 97.8 F (36.6 C), temperature source Oral, resp. rate 18, height 5\' 3"  (1.6 m), weight 49.6 kg, SpO2 100 %.  Disposition: Discharge disposition: 01-Home or Self Care       Discharge Instructions    Discharge wound care:   Complete by:  As directed    Leave the wound open to air. Shower daily with the wound uncovered. Water and soapy water should run over the incision area. Do not wash directly on the incision for 2 weeks. Remove the glue after 2 weeks.   Driving Restrictions   Complete by:  As directed    No driving for 2 weeks. May ride in the car locally now. May begin to drive locally in 2 weeks.   Other Restrictions   Complete by:  As directed    Walk gradually increasing distances out in the fresh air at least twice a day. Walking  additional 6 times inside the house, gradually increasing distances, daily. No bending, lifting, or twisting. Perform activities between shoulder and waist height (that is at counter height when standing or table height when sitting).     Allergies as of 06/24/2018      Reactions   Mobic [meloxicam] Other (See Comments)   Stomach bleeding    Penicillins Other (See Comments)   uncle highly allergic so was told whole family should not take it Did it involve swelling of the face/tongue/throat, SOB, or low BP? Unknown Did it involve sudden or severe rash/hives, skin peeling, or any reaction on the inside of your mouth or nose? Unknown Did you need to seek medical attention at a hospital or doctor's office? Unknown When did it last happen?Never taken before If all above answers are "NO", may proceed with cephalosporin use.   Nicotine Rash   Transdermal Patch      Medication List    TAKE these medications   atenolol 25 MG tablet Commonly known as:  TENORMIN TAKE 1/2 TABLET(12.5 MG) BY MOUTH DAILY What changed:  See the new instructions.   DULoxetine 60 MG capsule Commonly known as:  CYMBALTA Take 1 capsule (60 mg total) by mouth daily.   famotidine 20 MG tablet Commonly known as:  PEPCID Take 1 tablet (20 mg total) by mouth 2 (two) times daily.   fluticasone 50 MCG/ACT nasal spray Commonly known as:  Flonase Place 1 spray into both nostrils daily. What changed:    when to take this  reasons to take this   HYDROcodone-acetaminophen  5-325 MG tablet Commonly known as:  Norco Take 1 tablet by mouth every 8 (eight) hours as needed for moderate pain or severe pain. What changed:  Another medication with the same name was added. Make sure you understand how and when to take each.   HYDROcodone-acetaminophen 5-325 MG tablet Commonly known as:  NORCO/VICODIN Take 1-2 tablets by mouth every 4 (four) hours as needed (pain). What changed:  You were already taking a medication  with the same name, and this prescription was added. Make sure you understand how and when to take each.   lisinopril 20 MG tablet Commonly known as:  ZESTRIL TAKE 1 TABLET(20 MG) BY MOUTH DAILY What changed:    how much to take  how to take this  when to take this  additional instructions   omeprazole 40 MG capsule Commonly known as:  PRILOSEC Take 1 capsule (40 mg total) by mouth daily.   Proventil HFA 108 (90 Base) MCG/ACT inhaler Generic drug:  albuterol INHALE 2 PUFFS INTO THE LUNGS EVERY 6 HOURS AS NEEDED FOR WHEEZING OR SHORTNESS OF BREATH What changed:  See the new instructions.   tiZANidine 2 MG tablet Commonly known as:  ZANAFLEX Take 2 mg by mouth 2 (two) times daily.            Discharge Care Instructions  (From admission, onward)         Start     Ordered   06/24/18 0000  Discharge wound care:    Comments:  Leave the wound open to air. Shower daily with the wound uncovered. Water and soapy water should run over the incision area. Do not wash directly on the incision for 2 weeks. Remove the glue after 2 weeks.   06/24/18 0739           Signed: Hosie Spangle 06/24/2018, 7:39 AM

## 2018-06-29 ENCOUNTER — Telehealth: Payer: Self-pay | Admitting: Gastroenterology

## 2018-06-29 DIAGNOSIS — R1900 Intra-abdominal and pelvic swelling, mass and lump, unspecified site: Secondary | ICD-10-CM

## 2018-06-29 NOTE — Telephone Encounter (Signed)
Patient said that she does not know how long she needs to take the med Omeprazole. Patient also said she has not had a period since march and does not know if the medication is causing it.

## 2018-06-30 NOTE — Telephone Encounter (Signed)
Pt aware.

## 2018-06-30 NOTE — Telephone Encounter (Signed)
I would not expect omeprazole to cause any side effects with her menstrual period. I recommend that she review her concerns with her OB/GYN. I recommend that she complete 8 weeks of omeprazole. Thanks.

## 2018-06-30 NOTE — Telephone Encounter (Signed)
Spoke with patient informed her that she should take omeprazole x 8 weeks. She is concerned about skipping her menstrual period since march and if this may be due to the pelvic mass. She has been referred to [facility] but was waiting until her back surgery was done and COVID19 restrictions are lifted. Please advise.

## 2018-07-11 ENCOUNTER — Other Ambulatory Visit: Payer: Self-pay

## 2018-07-11 DIAGNOSIS — M5417 Radiculopathy, lumbosacral region: Secondary | ICD-10-CM

## 2018-07-11 DIAGNOSIS — Z981 Arthrodesis status: Secondary | ICD-10-CM | POA: Diagnosis not present

## 2018-07-11 DIAGNOSIS — M5126 Other intervertebral disc displacement, lumbar region: Secondary | ICD-10-CM | POA: Diagnosis not present

## 2018-07-11 NOTE — Telephone Encounter (Signed)
Pt rec'd additional 20 5/325 from dr Rita Ohara 4/24 after surgery, has not been 6 weeks

## 2018-07-11 NOTE — Telephone Encounter (Signed)
HYDROcodone-acetaminophen (NORCO) 5-325 MG tablet, refill request @  [facility], [place] [phone] (Phone) [phone] (Fax

## 2018-07-12 NOTE — Telephone Encounter (Signed)
Will discuss pain regimen at appointment tomorrow.  Alphonzo Grieve, MD IMTS - PGY3

## 2018-07-13 ENCOUNTER — Ambulatory Visit (INDEPENDENT_AMBULATORY_CARE_PROVIDER_SITE_OTHER): Payer: Medicaid Other | Admitting: Internal Medicine

## 2018-07-13 ENCOUNTER — Encounter: Payer: Self-pay | Admitting: Internal Medicine

## 2018-07-13 ENCOUNTER — Other Ambulatory Visit: Payer: Self-pay

## 2018-07-13 DIAGNOSIS — K279 Peptic ulcer, site unspecified, unspecified as acute or chronic, without hemorrhage or perforation: Secondary | ICD-10-CM | POA: Diagnosis not present

## 2018-07-13 DIAGNOSIS — G8929 Other chronic pain: Secondary | ICD-10-CM | POA: Diagnosis not present

## 2018-07-13 DIAGNOSIS — M5417 Radiculopathy, lumbosacral region: Secondary | ICD-10-CM | POA: Diagnosis not present

## 2018-07-13 DIAGNOSIS — R1900 Intra-abdominal and pelvic swelling, mass and lump, unspecified site: Secondary | ICD-10-CM | POA: Diagnosis not present

## 2018-07-13 DIAGNOSIS — M545 Low back pain: Secondary | ICD-10-CM | POA: Diagnosis not present

## 2018-07-13 DIAGNOSIS — Z981 Arthrodesis status: Secondary | ICD-10-CM | POA: Diagnosis not present

## 2018-07-13 DIAGNOSIS — Z8742 Personal history of other diseases of the female genital tract: Secondary | ICD-10-CM | POA: Insufficient documentation

## 2018-07-13 DIAGNOSIS — Z79899 Other long term (current) drug therapy: Secondary | ICD-10-CM

## 2018-07-13 DIAGNOSIS — Z9851 Tubal ligation status: Secondary | ICD-10-CM

## 2018-07-13 DIAGNOSIS — I10 Essential (primary) hypertension: Secondary | ICD-10-CM

## 2018-07-13 DIAGNOSIS — N83201 Unspecified ovarian cyst, right side: Secondary | ICD-10-CM | POA: Insufficient documentation

## 2018-07-13 DIAGNOSIS — Z79891 Long term (current) use of opiate analgesic: Secondary | ICD-10-CM

## 2018-07-13 HISTORY — DX: Intra-abdominal and pelvic swelling, mass and lump, unspecified site: R19.00

## 2018-07-13 HISTORY — DX: Peptic ulcer, site unspecified, unspecified as acute or chronic, without hemorrhage or perforation: K27.9

## 2018-07-13 MED ORDER — HYDROCODONE-ACETAMINOPHEN 5-325 MG PO TABS: 1.0000 | ORAL_TABLET | Freq: Three times a day (TID) | ORAL | 0 refills | Status: DC | PRN

## 2018-07-13 MED ORDER — CYCLOBENZAPRINE HCL 5 MG PO TABS: 5.0000 mg | ORAL_TABLET | Freq: Three times a day (TID) | ORAL | 1 refills | Status: DC | PRN

## 2018-07-13 NOTE — Assessment & Plan Note (Signed)
Had recent surgery for L5-S1 decompression with hardware placement. Had f/u appt with Dr. Sherwood Gambler and apparently healing and recovering appropriately per patient. No PT yet.   She was prescribed tinazedine post-op but feels she had greater relief with flexeril.  Plan: -change tinazedine for flexeril 5mg  TID PRN -continue chronic pain management; had received additional post-op course of hydrocodone-apap by neurosurgery which she contacted [place] about

## 2018-07-13 NOTE — Assessment & Plan Note (Signed)
Had ED visit for hematemesis; CT noted pyloric inflammation suggestive of PUD. CBC revealed anemia. She has been placed on 2 months of omeprazole. She was evaluated by GI - plan for H pylori Ag eval and EGD when restrictions lifted. She denies further symptoms and signs of bleeding.  Plan: -continue omeprazole -reminded of H pylori eval -f/u with GI for EGD

## 2018-07-13 NOTE — Assessment & Plan Note (Signed)
Melissa Walker is on chronic opioid therapy for chronic pain. The date of the controlled substances contract is referenced in the [place] and / or the overview. Date of pain contract was 11/14/2015. As part of the treatment plan, the [place] controlled substance database is checked at least twice yearly and the database results are appropriate. I have last reviewed the results on 07/13/2018.   The last UDS was on 12/2017 and results are as expected. Patient needs at least a yearly UDS.   The patient is on hydrocodone/acetaminophen (Lorcet, Lortab, Norco, Vicodin) strength 5-325, 90 per 30 days. Adjunctive treatment includes Cymbalta and Muscle relaxants. This regimen allows Melissa Walker to function and does not cause excessive sedation or other side effects.  "The benefits of continuing opioid therapy outweigh the risks and chronic opioids will be continued. Ongoing education about safe opioid treatment is provided  Interventions today include: Refill hydrocodone 5-325mg  #90 per month x3 Refill flexeril 5mg  TID PRN Continue Cymbalta 60mg  daily  Overall plan for eventual taper of opioids due to previous red flags; as patient had L5-S1 decompression surgery in April (source of her pain), plan for taper has been delayed. Next neurosurgery appt is in June/July. She has stopped meloxicam due to likely PUD presenting as hematemesis.  I advised patient to call early July to set up appt with new PCP to establish pain contract with new physician, reassess pain and attempt taper if post-surgically stable. She will need UDS at next visit. Patient stated understanding.

## 2018-07-13 NOTE — Progress Notes (Signed)
   CC: back pain, htn  This is a telephone encounter between Melissa Walker and Alphonzo Grieve on 07/13/2018 for back pain, htn, pelvic mass, PUD. The visit was conducted with the patient located at home and Alphonzo Grieve at [facility]. The patient's identity was confirmed using their DOB and current address. The patient has consented to being evaluated through a telephone encounter and understands the associated risks (an examination cannot be done and the patient may need to come in for an appointment) / benefits (allows the patient to remain at home, decreasing exposure to coronavirus). I personally spent 21 minutes on medical discussion.   HPI:  [name] is a 38 y.o. with PMH as below.   Please see A&P for assessment of the patient's acute and chronic medical conditions.   Past Medical History:  Diagnosis Date  . Abnormal uterine bleeding 02/04/2012   Occurred 01/12/12-01/15/12. Evaluated in [facility] and all labs, exam, and pelvic u/s WNL. Given rx for ibuprofen and told to follow up as otupatient.    . Anxiety    Pt states she's on cymbalta "to calm me down"  . Bronchitis    APPROX 6 MTHS AGO  . Cholelithiasis 10/26/2014  . Chronic back pain   . Dysrhythmia    RACING HEART - SHE SAID MEDICATION RELATED  . GERD (gastroesophageal reflux disease)    SCHEDULED TO SEE GI MD IN APRIL 2020  . Hypertension   . Neuromuscular disorder ([place])   . Tobacco abuse    Review of Systems:   Denies further vomiting or hematemesis; denies hematochezia and melena; denies abd or pelvic pain. Denies chest pain, shortness of breath, headaches, palpitations. Endorses missing menstrual cycle last month, but had normal period for her last week again.  Endorses lower back pain and soreness.   Assessment & Plan:   See Encounters Tab for problem based charting.  Patient discussed with Dr. Daryll Drown

## 2018-07-13 NOTE — Assessment & Plan Note (Addendum)
Incidentally noted on CT abd 05/2018. She has been referred to gynecology by GI already; referral seems to have been approved but no appt made yet. Patient states she missed April menstrual period but had normal menstruation last week; hCG was checked mid-April and was negative; she is s/p tubal ligation.   Plan: -patient will f/u on gyn referral; asked that she call us if does not have appt by next week

## 2018-07-13 NOTE — Assessment & Plan Note (Signed)
Does not check her BP at home but noted reading of 122/78 at recent neurosurgery visit; denies symptoms of uncontrolled HTN.  Plan: -continue lisinopril 20mg  daily and atenolol 12.5mg  daily

## 2018-07-14 ENCOUNTER — Telehealth: Payer: Self-pay | Admitting: *Deleted

## 2018-07-14 NOTE — Telephone Encounter (Addendum)
Information was sent to [employer] for [place] for Hydrocodone/Acetaminophen 5/325/mg  Tablets.  Awaiting determination.  Sander Nephew, RN 5/154/2020 10:39 AM. Call to [facility] for Hydrocodone/Acetaminophen 5/325 mg approved 07/14/2018 thru 01/10/2019.  [place] [phone].   Sander Nephew, RN 07/19/2018 9:30 AM.

## 2018-07-18 NOTE — Progress Notes (Signed)
Internal Medicine Clinic Attending  Case discussed with Dr. Jari Favre soon after the resident saw the patient.  We reviewed the resident's history, telephone conversation and pertinent patient test results.  I agree with the assessment, diagnosis, and plan of care documented in the resident's note.

## 2018-08-01 ENCOUNTER — Other Ambulatory Visit: Payer: Self-pay | Admitting: Emergency Medicine

## 2018-08-01 DIAGNOSIS — R933 Abnormal findings on diagnostic imaging of other parts of digestive tract: Secondary | ICD-10-CM

## 2018-08-01 DIAGNOSIS — Z8619 Personal history of other infectious and parasitic diseases: Secondary | ICD-10-CM

## 2018-08-02 ENCOUNTER — Encounter: Payer: Self-pay | Admitting: Gastroenterology

## 2018-08-02 ENCOUNTER — Ambulatory Visit (AMBULATORY_SURGERY_CENTER): Payer: Medicaid Other | Admitting: Gastroenterology

## 2018-08-02 ENCOUNTER — Other Ambulatory Visit: Payer: Self-pay

## 2018-08-02 VITALS — BP 118/63 | HR 89 | Temp 98.7°F | Resp 15 | Ht 63.0 in | Wt 118.0 lb

## 2018-08-02 DIAGNOSIS — K210 Gastro-esophageal reflux disease with esophagitis: Secondary | ICD-10-CM | POA: Diagnosis not present

## 2018-08-02 DIAGNOSIS — R933 Abnormal findings on diagnostic imaging of other parts of digestive tract: Secondary | ICD-10-CM

## 2018-08-02 DIAGNOSIS — Z8619 Personal history of other infectious and parasitic diseases: Secondary | ICD-10-CM

## 2018-08-02 DIAGNOSIS — K2970 Gastritis, unspecified, without bleeding: Secondary | ICD-10-CM | POA: Diagnosis not present

## 2018-08-02 DIAGNOSIS — K2950 Unspecified chronic gastritis without bleeding: Secondary | ICD-10-CM | POA: Diagnosis not present

## 2018-08-02 DIAGNOSIS — K209 Esophagitis, unspecified without bleeding: Secondary | ICD-10-CM

## 2018-08-02 MED ORDER — SODIUM CHLORIDE 0.9 % IV SOLN: 500.0000 mL | Freq: Once | INTRAVENOUS | Status: DC

## 2018-08-02 NOTE — Op Note (Signed)
[facility] Patient Name: Melissa Walker Procedure Date: 08/02/2018 11:27 AM MRN: [id number] Endoscopist: Thornton Park MD, MD Age: 38 Referring MD:  Date of Birth: 1980/08/09 Gender: Female Account #: [id number] Procedure:                Upper GI endoscopy Indications:              Abnormal CT of the GI tract                           Recent hematemesis and melena                           Abnormal CT 05/03/18                           - right lower quadrant pain showed a thickened                            pylorus                           - rounded and oval soft tissue filling defects in                            the proximal stomach                           Daily NSAIDs - meloxicam and ibuprofen for back pain                           Cholelithiasis                           Macrocytic anemia                           - 05/03/18: hgb 11.0                           - 05/16/18 hgb 9.2Recent hematemesis and melena Medicines:                See the Anesthesia note for documentation of the                            administered medications Procedure:                Pre-Anesthesia Assessment:                           - Prior to the procedure, a History and Physical                            was performed, and patient medications and                            allergies were reviewed. The patient's tolerance of  previous anesthesia was also reviewed. The risks                            and benefits of the procedure and the sedation                            options and risks were discussed with the patient.                            All questions were answered, and informed consent                            was obtained. Prior Anticoagulants: The patient has                            taken no previous anticoagulant or antiplatelet                            agents. ASA Grade Assessment: II - A patient with                            mild  systemic disease. After reviewing the risks                            and benefits, the patient was deemed in                            satisfactory condition to undergo the procedure.                           After obtaining informed consent, the endoscope was                            passed under direct vision. Throughout the                            procedure, the patient's blood pressure, pulse, and                            oxygen saturations were monitored continuously. The                            Endoscope was introduced through the mouth, and                            advanced to the second part of duodenum. The upper                            GI endoscopy was accomplished without difficulty.                            The patient tolerated the procedure well. Scope In: Scope Out: Findings:  LA Grade A (one or more mucosal breaks less than 5                            mm, not extending between tops of 2 mucosal folds)                            esophagitis with no bleeding was found.                           The entire examined stomach was normal. Biopsies                            were taken with a cold forceps for histology.                            Estimated blood loss was minimal.                           The examined duodenum was normal.                           The cardia and gastric fundus were normal on                            retroflexion. Complications:            No immediate complications. Estimated blood loss:                            Minimal. Estimated Blood Loss:     Estimated blood loss was minimal. Impression:               - LA Grade A reflux esophagitis.                           - Normal stomach. Biopsied.                           - Normal examined duodenum. Recommendation:           - Patient has a contact number available for                            emergencies. The signs and symptoms of potential                             delayed complications were discussed with the                            patient. Return to normal activities tomorrow.                            Written discharge instructions were provided to the                            patient.                           -  Resume regular diet today.                           - Continue present medications including omeprazole                            20 mg daily and famotidine 20 mg twice daily.                           - Avoid all NSAIDs - including meloxicam and                            ibuprofen.                           - Await pathology results.                           - Repeat upper endoscopy is not recommended.                           - Follow-up virtual encounter in 2-4 weeks. Thornton Park MD, MD 08/02/2018 11:47:24 AM This report has been signed electronically.

## 2018-08-02 NOTE — Patient Instructions (Signed)
YOU HAD AN ENDOSCOPIC PROCEDURE TODAY AT [facility]:   Refer to the procedure report that was given to you for any specific questions about what was found during the examination.  If the procedure report does not answer your questions, please call your gastroenterologist to clarify.  If you requested that your care partner not be given the details of your procedure findings, then the procedure report has been included in a sealed envelope for you to review at your convenience later.  YOU SHOULD EXPECT: Some feelings of bloating in the abdomen. Passage of more gas than usual.  Walking can help get rid of the air that was put into your GI tract during the procedure and reduce the bloating. If you had a lower endoscopy (such as a colonoscopy or flexible sigmoidoscopy) you may notice spotting of blood in your stool or on the toilet paper. If you underwent a bowel prep for your procedure, you may not have a normal bowel movement for a few days.  Please Note:  You might notice some irritation and congestion in your nose or some drainage.  This is from the oxygen used during your procedure.  There is no need for concern and it should clear up in a day or so.  SYMPTOMS TO REPORT IMMEDIATELY:     Following upper endoscopy (EGD)  Vomiting of blood or coffee ground material  New chest pain or pain under the shoulder blades  Painful or persistently difficult swallowing  New shortness of breath  Fever of 100F or higher  Black, tarry-looking stools  For urgent or emergent issues, a gastroenterologist can be reached at any hour by calling [phone].   DIET:  We do recommend a small meal at first, but then you may proceed to your regular diet.  Drink plenty of fluids but you should avoid alcoholic beverages for 24 hours.  ACTIVITY:  You should plan to take it easy for the rest of today and you should NOT DRIVE or use heavy machinery until tomorrow (because of the sedation medicines  used during the test).    FOLLOW UP: Our staff will call the number listed on your records 48-72 hours following your procedure to check on you and address any questions or concerns that you may have regarding the information given to you following your procedure. If we do not reach you, we will leave a message.  We will attempt to reach you two times.  During this call, we will ask if you have developed any symptoms of COVID 19. If you develop any symptoms (ie: fever, flu-like symptoms, shortness of breath, cough etc.) before then, please call [phone].  If you test positive for Covid 19 in the 2 weeks post procedure, please call and report this information to [place].    If any biopsies were taken you will be contacted by phone or by letter within the next 1-3 weeks.  Please call us at [phone] if you have not heard about the biopsies in 3 weeks.    SIGNATURES/CONFIDENTIALITY: You and/or your care partner have signed paperwork which will be entered into your electronic medical record.  These signatures attest to the fact that that the information above on your After Visit Summary has been reviewed and is understood.  Full responsibility of the confidentiality of this discharge information lies with you and/or your care-partner.    Handout was given to your care partner on GERD. Per Dr. Tarri Glenn continue taking OMEPRAZOLE 20 mg daily  and FAMOTIDINE 20 mg twice daily. AVOID all NSAIDs - including Meloxicam and Ibuprofen. You may resume your current medications today. Await biopsy results. The offce will call you to schedule a virtual encounter in 2-4 weeks. Please call if any questions or concerns.

## 2018-08-02 NOTE — Progress Notes (Signed)
Melissa Walker , CMA- Temp Judy Branson, CMA- Vitals  

## 2018-08-02 NOTE — Progress Notes (Signed)
Called to room to assist during endoscopic procedure.  Patient ID and intended procedure confirmed with present staff. Received instructions for my participation in the procedure from the performing physician.  

## 2018-08-02 NOTE — Progress Notes (Signed)
Spontaneous respirations throughout. VSS. Resting comfortably. To PACU on room air. Report to  RN. 

## 2018-08-03 ENCOUNTER — Encounter: Payer: Self-pay | Admitting: *Deleted

## 2018-08-03 ENCOUNTER — Telehealth: Payer: Self-pay | Admitting: *Deleted

## 2018-08-03 NOTE — Telephone Encounter (Signed)
Left message for patient to call back to reschedule if tentative 3 week follow up on 08/23/2018 at 9:00 am does not work with the patient's schedule.

## 2018-08-04 ENCOUNTER — Telehealth: Payer: Self-pay | Admitting: *Deleted

## 2018-08-04 ENCOUNTER — Telehealth: Payer: Self-pay

## 2018-08-04 NOTE — Telephone Encounter (Signed)
Left voicemail, will try again later today.

## 2018-08-04 NOTE — Telephone Encounter (Signed)
  Follow up Call-  Call back number 08/02/2018  Post procedure Call Back phone  # [phone]  Permission to leave phone message Yes  Some recent data might be hidden     Patient questions:  Do you have a fever, pain , or abdominal swelling? No. Pain Score  0 *  Have you tolerated food without any problems? Yes.    Have you been able to return to your normal activities? Yes.    Do you have any questions about your discharge instructions: Diet   No. Medications  No. Follow up visit  No.  Do you have questions or concerns about your Care? No.  Actions: * If pain score is 4 or above: No action needed, pain <4.  1. Have you developed a fever since your procedure? NO  2.   Have you had an respiratory symptoms (SOB or cough) since your procedure? NO  3.   Have you tested positive for COVID 19 since your procedure NO  4.   Have you had any family members/close contacts diagnosed with the COVID 19 since your procedure?  NO   If yes to any of these questions please route to Joylene John, RN and Alphonsa Gin, RN.

## 2018-08-08 ENCOUNTER — Encounter: Payer: Self-pay | Admitting: Gastroenterology

## 2018-08-11 ENCOUNTER — Telehealth: Payer: Self-pay | Admitting: Gastroenterology

## 2018-08-11 ENCOUNTER — Encounter: Payer: Self-pay | Admitting: *Deleted

## 2018-08-11 NOTE — Telephone Encounter (Signed)
Spoke to the patient who did not understand the results of her upper endoscopy. This RN read the letter that Dr. Tarri Glenn mailed but the patient had not received yet. The patient was still unsure of what this all meant but this RN told the patient that she had a virtual f/u visit with Dr. Tarri Glenn on 6/23 and would be able to discuss all of her questions and concerns during this meeting. Patient verbalized understand. Nothing further needed at the time of the phone call.

## 2018-08-11 NOTE — Telephone Encounter (Signed)
Patient called and wanted to speak with the nurse about lab results.

## 2018-08-15 ENCOUNTER — Other Ambulatory Visit: Payer: Self-pay | Admitting: *Deleted

## 2018-08-15 DIAGNOSIS — M5417 Radiculopathy, lumbosacral region: Secondary | ICD-10-CM

## 2018-08-15 NOTE — Telephone Encounter (Signed)
Patient has 2 more refills at the pharmacy; confirmed by calling pharmacy this afternoon.  Melissa Walker

## 2018-08-17 ENCOUNTER — Encounter: Payer: Self-pay | Admitting: Internal Medicine

## 2018-08-17 ENCOUNTER — Telehealth: Payer: Self-pay | Admitting: *Deleted

## 2018-08-17 ENCOUNTER — Ambulatory Visit (INDEPENDENT_AMBULATORY_CARE_PROVIDER_SITE_OTHER): Payer: Medicaid Other | Admitting: Internal Medicine

## 2018-08-17 ENCOUNTER — Other Ambulatory Visit: Payer: Self-pay

## 2018-08-17 ENCOUNTER — Ambulatory Visit: Payer: Medicaid Other

## 2018-08-17 DIAGNOSIS — L03012 Cellulitis of left finger: Secondary | ICD-10-CM | POA: Insufficient documentation

## 2018-08-17 MED ORDER — DOXYCYCLINE HYCLATE 100 MG PO TABS: 100.0000 mg | ORAL_TABLET | Freq: Two times a day (BID) | ORAL | 0 refills | Status: AC

## 2018-08-17 NOTE — Progress Notes (Signed)
   This is a telephone encounter between Iantha Titsworth and Andree Elk  on 08/17/2018 for left ring finger pain. The visit was conducted with the patient located at home and [facility]. The patient's identity was confirmed using their DOB and current address. The patient has consented to being evaluated through a telephone encounter and understands the associated risks/benefits. I personally spent 6 minutes on medical discussion.   HPI:   [name] is a 38 y.o. female with the medical conditions listed below who called the internal medicine clinic for pain in her left ring finger. She states she had a hangnail that she pulled off last week. She began to experience redness, pain, and swelling of the medial fingernail/finger two days ago. She states the redness has stayed localized. When she presses on the finger, she can express pus from the medial nail fold. She has been cleaning the area with peroxide and applying neosporin. She denies fevers, chills, or other systemic symptoms. No other fingers are involved. She sent a picture via mychart. Please see problem based charting for the history and status of the patient's assessment and plan.   Past Medical History:  Diagnosis Date  . Abnormal uterine bleeding 02/04/2012   Occurred 01/12/12-01/15/12. Evaluated in [facility] and all labs, exam, and pelvic u/s WNL. Given rx for ibuprofen and told to follow up as otupatient.    . Anxiety    Pt states she's on cymbalta "to calm me down"  . Cholelithiasis 10/26/2014  . Chronic back pain   . GERD (gastroesophageal reflux disease)    possible PUD; getting workup currently  . Hypertension   . Inappropriate sinus tachycardia 04/21/2017  . Pelvic mass 07/13/2018   6.1 x 2.3 cm posterior pelvic cystic mass noted on CT 05/2018.  Marland Kitchen PUD (peptic ulcer disease) 07/13/2018  . Tobacco abuse   . Underweight 10/26/2014    Review of Systems:   Pertinent positives mentioned in HPI. Remainder of  all ROS negative.   Assessment & Plan:   Patient discussed with Dr. Angelia Mould

## 2018-08-17 NOTE — Telephone Encounter (Signed)
Received message from clinic staff that pt had called requesting rx for antibiotic.  Return call made to pt-states she pulled off a "hangnail" from her ring finger (left) approximately 1 week ago and now area is red, tender, and painful.  If she pushes down on it, a little "pus" will come out.  Pt is afebrile. Pt was offered an appt for this afternoon, but states she had recent back surgery which makes it difficult to walk.  She is also concerned about picking up "germs".  Pt will attempt to send a photo via [place]. Will schedule pt for telehealth visit today in [facility].[name]6/17/202012:32 PM

## 2018-08-17 NOTE — Assessment & Plan Note (Signed)
HPI: [name] is a 38 y.o. female with the medical conditions listed below who called the internal medicine clinic for pain in her left ring finger. She states she had a hangnail that she pulled off last week. She began to experience redness, pain, and swelling of the medial fingernail/finger two days ago. She states the redness has stayed localized. When she presses on the finger, she can express pus from the medial nail fold. She has been cleaning the area with peroxide and applying neosporin. She denies fevers, chills, or other systemic symptoms. No other fingers are involved.   Assessment: Patient sent a picture via mychart, which is poor quality, but does show erythema of the medial finger. Appears consistent with paronychia. Advised patient to call back if does not improve with treatment.  Plan - Doxycycline 100mg  BID for three days - Regular finger soaking

## 2018-08-19 ENCOUNTER — Other Ambulatory Visit: Payer: Self-pay | Admitting: Internal Medicine

## 2018-08-22 ENCOUNTER — Encounter: Payer: Self-pay | Admitting: Obstetrics and Gynecology

## 2018-08-22 ENCOUNTER — Ambulatory Visit: Payer: Medicaid Other | Admitting: Obstetrics and Gynecology

## 2018-08-22 ENCOUNTER — Other Ambulatory Visit: Payer: Self-pay

## 2018-08-22 VITALS — BP 131/64 | HR 75 | Temp 98.0°F | Wt 104.0 lb

## 2018-08-22 DIAGNOSIS — R1900 Intra-abdominal and pelvic swelling, mass and lump, unspecified site: Secondary | ICD-10-CM

## 2018-08-22 NOTE — Progress Notes (Signed)
Obstetrics and Gynecology New Patient Evaluation  Appointment Date: 08/22/2018  OBGYN Clinic: [facility]  Primary Care Provider: Jose Walker  Referring Provider: Thornton Park, MD  Chief Complaint:  Chief Complaint  Patient presents with  . pelvic mass    History of Present Illness: Melissa Walker is a 38 y.o. Caucasian [id number] LMP early June 2020 , seen for the above chief complaint.  Patient went to ED in march for hematemesis and states that during physical exam she had rlq pain so a ct was ordered. Pt states it wasn't until she saw GI after that that she was told about the pelvic mass seen on CT.  No VB, discharge, itching, dyspareunia. No current pain  Review of Systems: as noted in the History of Present Illness.  Patient Active Problem List   Diagnosis Date Noted  . Paronychia of left ring finger 08/17/2018  . Pelvic mass 07/13/2018  . PUD (peptic ulcer disease) 07/13/2018  . Inappropriate sinus tachycardia 04/21/2017  . Hypertension 08/10/2016  . Night sweats 03/30/2016  . Thyroid function study abnormality 03/30/2016  . Long-term current use of opiate analgesic 02/27/2016  . Adult acne 12/04/2014  . Anxiety and depression 10/26/2014  . Healthcare maintenance 10/26/2014  . Underweight 10/26/2014  . Lumbosacral radiculopathy 04/13/2008  . TOBACCO ABUSE 02/02/2006     Past Medical History:  Past Medical History:  Diagnosis Date  . Abnormal uterine bleeding 02/04/2012   Occurred 01/12/12-01/15/12. Evaluated in [facility] and all labs, exam, and pelvic u/s WNL. Given rx for ibuprofen and told to follow up as otupatient.    . Anxiety    Pt states she's on cymbalta "to calm me down"  . Cholelithiasis 10/26/2014  . Chronic back pain   . GERD (gastroesophageal reflux disease)    possible PUD; getting workup currently  . Hypertension   . Inappropriate sinus tachycardia 04/21/2017  . Pelvic mass 07/13/2018   6.1 x 2.3 cm posterior pelvic  cystic mass noted on CT 05/2018.  Marland Kitchen PUD (peptic ulcer disease) 07/13/2018  . Tobacco abuse   . Underweight 10/26/2014    Past Surgical History:  Past Surgical History:  Procedure Laterality Date  . DENTAL REHABILITATION     full set dentures  . LUMBAR LAMINECTOMY/DECOMPRESSION MICRODISCECTOMY Right 07/23/2014   Procedure: Right Lumbar five-Sacral one laminotomy and microdiskectomy;  Surgeon: Melissa Gamma, MD;  Location: [facility];  Service: Neurosurgery;  Laterality: Right;  Right Lumbar five-Sacral one laminotomy and microdiskectomy  . Multiple epidural steroid injections in back     Over past 2 years  . POSTERIOR LAMINECTOMY / DECOMPRESSION LUMBAR SPINE  06/23/2018   L5-S1 decompression   . TUBAL LIGATION Bilateral 05/11/2013   Procedure: POST PARTUM TUBAL LIGATION;  Surgeon: Melissa Oman, MD;  Location: [facility];  Service: Gynecology;  Laterality: Bilateral;    Past Obstetrical History:  OB History  Gravida Para Term Preterm AB Living  2 2 2     2   SAB TAB Ectopic Multiple Live Births          2    # Outcome Date GA Lbr Len/2nd Weight Sex Delivery Anes PTL Lv  2 Term 05/10/13 [redacted]w[redacted]d 02:51 / 00:11 7 lb 2.8 oz (3.255 kg) M Vag-Spont None  LIV  1 Term 2005 [redacted]w[redacted]d  7 lb 1 oz (3.204 kg) M Vag-Spont EPI  LIV     Birth Comments: No complications     Past Gynecological History: As per HPI. Periods: qmonth, regular,  no irregular bleeding, one week, not particularly heavy of painful History of Pap Smear(s): Yes.   Last pap 2014, which was negative History of STI(s): No. She is currently using BTL via filshie for contraception.   Social History:  Social History   Socioeconomic History  . Marital status: Single    Spouse name: Not on file  . Number of children: Not on file  . Years of education: Not on file  . Highest education level: Not on file  Occupational History  . Not on file  Social Needs  . Financial resource strain: Not on file  . Food insecurity    Worry: Not  on file    Inability: Not on file  . Transportation needs    Medical: Not on file    Non-medical: Not on file  Tobacco Use  . Smoking status: Current Every Day Smoker    Packs/day: 0.50    Years: 18.00    Pack years: 9.00    Types: Cigarettes  . Smokeless tobacco: Never Used  . Tobacco comment: sometimes less. Cutting back  Substance and Sexual Activity  . Alcohol use: Yes    Alcohol/week: 0.0 standard drinks    Comment: occasional  . Drug use: No  . Sexual activity: Yes    Birth control/protection: Condom, Surgical  Lifestyle  . Physical activity    Days per week: Not on file    Minutes per session: Not on file  . Stress: Not on file  Relationships  . Social connections    Talks on phone: More than three times a week    Gets together: Not on file    Attends religious service: Not on file    Active member of club or organization: Not on file    Attends meetings of clubs or organizations: Not on file    Relationship status: Not on file  . Intimate partner violence    Fear of current or ex partner: No    Emotionally abused: No    Physically abused: No    Forced sexual activity: No  Other Topics Concern  . Not on file  Social History Narrative  . Not on file    Family History:  Family History  Problem Relation Age of Onset  . Cancer Father        brain tumor   . Eclampsia Maternal Grandmother   . Heart attack Maternal Grandmother   . Cancer Maternal Grandmother        lung  . Heart disease Maternal Grandmother   . Hyperlipidemia Maternal Grandmother   . Hypertension Maternal Grandmother    She denies any female cancers  Medications Melissa Walker. Melissa Walker had no medications administered during this visit. Current Outpatient Medications  Medication Sig Dispense Refill  . atenolol (TENORMIN) 25 MG tablet TAKE 1/2 TABLET(12.5 MG) BY MOUTH DAILY (Patient taking differently: Take 12.5 mg by mouth daily. ) 45 tablet 1  . cyclobenzaprine (FLEXERIL) 5 MG tablet Take 1  tablet (5 mg total) by mouth 3 (three) times daily as needed for muscle spasms. 90 tablet 1  . famotidine (PEPCID) 20 MG tablet Take 1 tablet (20 mg total) by mouth 2 (two) times daily. 60 tablet 3  . fluticasone (FLONASE) 50 MCG/ACT nasal spray Place 1 spray into both nostrils daily. (Patient taking differently: Place 1 spray into both nostrils daily as needed for allergies. ) 16 g 2  . HYDROcodone-acetaminophen (NORCO) 5-325 MG tablet Take 1 tablet by mouth every 8 (eight) hours  as needed for moderate pain or severe pain. 90 tablet 0  . lisinopril (PRINIVIL,ZESTRIL) 20 MG tablet TAKE 1 TABLET(20 MG) BY MOUTH DAILY (Patient taking differently: Take 20 mg by mouth daily. ) 90 tablet 3  . omeprazole (PRILOSEC) 40 MG capsule Take 1 capsule (40 mg total) by mouth daily. 90 capsule 3  . PROVENTIL HFA 108 (90 Base) MCG/ACT inhaler INHALE 2 PUFFS INTO THE LUNGS EVERY 6 HOURS AS NEEDED FOR WHEEZING OR SHORTNESS OF BREATH (Patient taking differently: Inhale 2 puffs into the lungs every 6 (six) hours as needed for wheezing or shortness of breath. ) 6.7 g 0  . DULoxetine (CYMBALTA) 60 MG capsule TAKE 1 CAPSULE(60 MG) BY MOUTH DAILY 30 capsule 5   No current facility-administered medications for this visit.     Allergies Mobic [meloxicam], Penicillins, and Nicotine   Physical Exam:  BP 131/64   Pulse 75   Temp 98 F (36.7 C)   Wt 104 lb (47.2 kg)   BMI 18.42 kg/m  Body mass index is 18.42 kg/m. General appearance: Well nourished, well developed female in no acute distress.  Cardiovascular: normal s1 and s2.  No murmurs, rubs or gallops. Respiratory:  Clear to auscultation bilateral. Normal respiratory effort Abdomen: patient wearing belly/back brace Neuro/Psych:  Normal mood and affect.  Skin:  Warm and dry.  Lymphatic:  No inguinal lymphadenopathy.   Pelvic exam: patient declines  Laboratory: no relevant labs  Radiology:  CLINICAL DATA:  Right lower quadrant pain  EXAM: CT ABDOMEN AND  PELVIS WITH CONTRAST  TECHNIQUE: Multidetector CT imaging of the abdomen and pelvis was performed using the standard protocol following bolus administration of intravenous contrast.  CONTRAST:  137mL OMNIPAQUE IOHEXOL 300 MG/ML  SOLN  COMPARISON:  None.  FINDINGS: Lower chest: Lung bases demonstrate no acute consolidation or effusion. The heart size is normal  Hepatobiliary: No focal hepatic abnormality. No calcified gallstone. Questionable small amount of edema adjacent to the gallbladder.  Pancreas: Unremarkable. No pancreatic ductal dilatation or surrounding inflammatory changes.  Spleen: Normal in size without focal abnormality.  Adrenals/Urinary Tract: Adrenal glands are unremarkable. Kidneys are normal, without renal calculi, focal lesion, or hydronephrosis. Bladder is unremarkable.  Stomach/Bowel: Slightly thickened appearance of the pylorus. Rounded and oval soft tissue filling defects within the proximal stomach. Appendix appears normal. No evidence of bowel wall thickening, distention, or inflammatory changes.  Vascular/Lymphatic: No significant vascular findings are present. No enlarged abdominal or pelvic lymph nodes.  Reproductive: Uterus is unremarkable. 6.1 x 2.3 cm posterior pelvic cystic mass.  Other: Negative for free air or free fluid.  Musculoskeletal: No acute or significant osseous findings.  IMPRESSION: 1. Slightly thickened appearance of the pylorus which could be secondary to spasm or peptic ulcer disease. Rounded to oval soft tissue filling defects within the proximal stomach, appearance not typical for debris. Correlation with endoscopy could be obtained to exclude soft tissue mass. 2. Questionable small amount of edema adjacent to the gallbladder. If symptomatology suggests gallbladder disease, would recommend correlation with ultrasound 3. 6.1 x 2.3 cm posterior pelvic cystic mass. Recommend correlation with pelvic  ultrasound. 4. Negative for acute appendicitis.   Electronically Signed   By: Donavan Foil M.D.   On: 05/03/2018 20:10  CLINICAL DATA:  Abdominal pain and vomiting for 3 days  EXAM: ULTRASOUND ABDOMEN LIMITED RIGHT UPPER QUADRANT  COMPARISON:  CT abdomen pelvis 05/03/2018  FINDINGS: Gallbladder:  Large gallstone measures up to 2.6 cm. A negative sonographic Percell Miller sign was reported by  the sonographer. Trace pericholecystic fluid. No gallbladder wall thickening.  Common bile duct:  Diameter: 6 mm  Liver:  Mildly increased hepatic echogenicity. Portal vein is patent on color Doppler imaging with normal direction of blood flow towards the liver.  IMPRESSION: Cholelithiasis with no other evidence of acute cholecystitis.   Electronically Signed   By: Ulyses Jarred M.D.   On: 05/03/2018 23:27  Assessment: pt stable  Plan:  1. Pelvic mass D/w her that even though she had a btl, she is still cycling like normal. Will get early cycle u/s and virtual visit after that. Pt declines pelvic exam and pap smear due to recent back surgery. D/w her that she needs another pap at some point b/c she is due. - US PELVIC COMPLETE WITH TRANSVAGINAL; Future  RTC after u/s  Durene Romans MD Attending Center [facility])

## 2018-08-22 NOTE — Progress Notes (Signed)
TELEHEALTH VISIT  Referring Provider: Alphonzo Grieve, MD Primary Care Physician:  Jose Persia, MD   Tele-visit due to COVID-19 pandemic Patient requested visit virtually, consented to the virtual encounter via video enabled telemedicine application (Zoom) Contact made at: 09:04 08/23/18 Patient verified by name and date of birth Location of patient: Home Location provider: [place] [facility] Names of persons participating: Me, patient, Tinnie Gens CMA Time spent on telehealth visit: 16 minutes I discussed the limitations of evaluation and management by telemedicine. The patient expressed understanding and agreed to proceed.   Chief complaint:  Ulcer, blood in the stool   IMPRESSION:  Gastritis presenting with recent hematemesis and melena and abnormal CT scan    - likely due to NSAIDs (meloxicam and ibuprofen for back pain)    - EGD biopsies 08/02/18: Antral and oxyntic mucosa with slight chronic inflammation and hyperemia, no H. pylori Abnormal CT 05/03/18    -  right lower quadrant pain showed a thickened pylorus    - rounded and oval soft tissue filling defects in the proximal stomach Cholelithiasis Macrocytic anemia    - 05/03/18: hgb 11.0    - 05/16/18 hgb 9.2    - 06/14/18: hgb 10.4, MCV 102, RDW 14.1 6.1 x 2.3 cm posterior pelvic cystic mass on CT 05/03/18 No known family history of colon cancer or polyps  Symptoms have resolved on daily PPI and H2 blocker therapy.  She has removed NSAIDs from her medical regimen.  Reviewed her EGD and pathology results.  All questions were answered to her satisfaction.  We will plan a taper of her PPI therapy after 8 weeks.  We will proceed with further evaluation of her unexplained macrocytic anemia.  PLAN: Continue omeprazole 40 mg daily x 8 weeks then taper to off Continue famotidine 20 mg BID Avoid all NSAIDs CBC, ferritin, iron, B12, folate Follow-up with GYN as planned Follow-up in 3 months, earlier if necessary    HPI: Melissa Walker is a 38 y.o. female returns in follow-up of nausea, hematemesis and an abnormal CT scan.  Initial consultation was performed 05/26/2018.  Upper endoscopy was performed 08/02/2018.  The interval history is obtained through the patient and review of her electronic health record.   Upper endoscopy 08/02/2018 showed LA grade a reflux esophagitis and mild H. pylori negative gastropathy.  There was no intestinal metaplasia.  The endoscopy was otherwise normal.  Her omeprazole 20 mg daily was increased to 40 mg daily and famotidine 20 mg twice daily.  She was asked to stop using meloxicam and ibuprofen.  She is successfully removed all NSAIDs from her medical regimen.  Since her endoscopy she has had complete resolution of her nausea, right-sided abdominal pain, and recent hematemesis.  She is had no further melena.  She reports no ongoing GI complaints.  She had questions about the pathology results on her recent endoscopy.  She has no new complaints or concerns today.  She saw Dr. Ilda Basset in consultation yesterday and he is evaluating her pelvic cystic mass seen on CT from 05/03/2018.  I reviewed his consultation note that shows plans for a transvaginal pelvic ultrasound.  On 06/22/1929 she had a successful Bilateral L5-S1 lumbar decompression including laminectomy, facetectomy, and foraminotomies, for decompression of the stenotic compression of the exiting L5 and S1 nerve roots bilaterally with decompression beyond that required for interbody arthrodesis; bilateral L5-S1 discectomy; bilateral L5-S1 posterior lumbar interbody arthrodesis with Tritanium interbody implants, Vitoss BA with bone marrow aspirate, and infuse; bilateral  L5-S1 posterior lateral arthrodesis with nonsegmental radius posterior instrumentation, Vitoss BA with bone marrow aspirate, and infuse.  Pre-surgical screening labs showed a hemoglobin of 10.4.  No transfusion was required.  She reports an uneventful postoperative  course.  Recent studies include: Labs 05/03/18 showed a normal CMP except for K 3.4, alb 3.0. Liver enzymes (AST 12, ALT 8, alk phos 77, TB 0.3) and lipase (27) were normal.  Hgb 11.0, MCV 107, RDW 12.3. WBC 12.5, platelets 540.  CT abdomen/pelvis with contrast 05/03/18 for right lower quadrant pain showed a thickened pylorus, rounded and oval soft tissue filling defects in the proximal stomach. Possible edema adjacent to the gallbladder - ultrasound recommended.  There is a 6.1 x 2.3 cm posterior pelvic cystic mass.  Abdominal ultrasound 05/03/18 for abdominal pain and vomiting showed cholelithiasis with no other findings. Large gallstone measures up to 2.6 cm. No gallbladder wall edema identified.  Labs 05/16/18: normal BMP, WBC 7.4, hgb 9.2, RDW 12.9, MCV 106.3  Labs 06/14/18: hgb 10.4, MCV 102, RDW 14.1   Past Medical History:  Diagnosis Date  . Abnormal uterine bleeding 02/04/2012   Occurred 01/12/12-01/15/12. Evaluated in [facility] and all labs, exam, and pelvic u/s WNL. Given rx for ibuprofen and told to follow up as otupatient.    . Anxiety    Pt states she's on cymbalta "to calm me down"  . Cholelithiasis 10/26/2014  . Chronic back pain   . GERD (gastroesophageal reflux disease)    possible PUD; getting workup currently  . Hypertension   . Inappropriate sinus tachycardia 04/21/2017  . Pelvic mass 07/13/2018   6.1 x 2.3 cm posterior pelvic cystic mass noted on CT 05/2018.  Marland Kitchen PUD (peptic ulcer disease) 07/13/2018  . Tobacco abuse   . Underweight 10/26/2014    Past Surgical History:  Procedure Laterality Date  . DENTAL REHABILITATION     full set dentures  . LUMBAR LAMINECTOMY/DECOMPRESSION MICRODISCECTOMY Right 07/23/2014   Procedure: Right Lumbar five-Sacral one laminotomy and microdiskectomy;  Surgeon: Jovita Gamma, MD;  Location: [facility];  Service: Neurosurgery;  Laterality: Right;  Right Lumbar five-Sacral one laminotomy and microdiskectomy  . Multiple epidural steroid injections in  back     Over past 2 years  . POSTERIOR LAMINECTOMY / DECOMPRESSION LUMBAR SPINE  06/23/2018   L5-S1 decompression   . TUBAL LIGATION Bilateral 05/11/2013   Procedure: POST PARTUM TUBAL LIGATION;  Surgeon: Osborne Oman, MD;  Location: [facility];  Service: Gynecology;  Laterality: Bilateral;    Current Outpatient Medications  Medication Sig Dispense Refill  . atenolol (TENORMIN) 25 MG tablet TAKE 1/2 TABLET(12.5 MG) BY MOUTH DAILY (Patient taking differently: Take 12.5 mg by mouth daily. ) 45 tablet 1  . cyclobenzaprine (FLEXERIL) 5 MG tablet Take 1 tablet (5 mg total) by mouth 3 (three) times daily as needed for muscle spasms. 90 tablet 1  . DULoxetine (CYMBALTA) 60 MG capsule TAKE 1 CAPSULE(60 MG) BY MOUTH DAILY 30 capsule 5  . famotidine (PEPCID) 20 MG tablet Take 1 tablet (20 mg total) by mouth 2 (two) times daily. 60 tablet 3  . fluticasone (FLONASE) 50 MCG/ACT nasal spray Place 1 spray into both nostrils daily. (Patient taking differently: Place 1 spray into both nostrils daily as needed for allergies. ) 16 g 2  . HYDROcodone-acetaminophen (NORCO) 5-325 MG tablet Take 1 tablet by mouth every 8 (eight) hours as needed for moderate pain or severe pain. 90 tablet 0  . lisinopril (PRINIVIL,ZESTRIL) 20 MG tablet TAKE 1  TABLET(20 MG) BY MOUTH DAILY (Patient taking differently: Take 20 mg by mouth daily. ) 90 tablet 3  . omeprazole (PRILOSEC) 40 MG capsule Take 1 capsule (40 mg total) by mouth daily. 90 capsule 3  . PROVENTIL HFA 108 (90 Base) MCG/ACT inhaler INHALE 2 PUFFS INTO THE LUNGS EVERY 6 HOURS AS NEEDED FOR WHEEZING OR SHORTNESS OF BREATH (Patient taking differently: Inhale 2 puffs into the lungs every 6 (six) hours as needed for wheezing or shortness of breath. ) 6.7 g 0   No current facility-administered medications for this visit.     Allergies as of 08/23/2018 - Review Complete 08/22/2018  Allergen Reaction Noted  . Mobic [meloxicam] Other (See Comments) 06/20/2018  . Penicillins  Other (See Comments)   . Nicotine Rash     Family History  Problem Relation Age of Onset  . Cancer Father        brain tumor   . Eclampsia Maternal Grandmother   . Heart attack Maternal Grandmother   . Cancer Maternal Grandmother        lung  . Heart disease Maternal Grandmother   . Hyperlipidemia Maternal Grandmother   . Hypertension Maternal Grandmother     Social History   Socioeconomic History  . Marital status: Single    Spouse name: Not on file  . Number of children: Not on file  . Years of education: Not on file  . Highest education level: Not on file  Occupational History  . Not on file  Social Needs  . Financial resource strain: Not on file  . Food insecurity    Worry: Not on file    Inability: Not on file  . Transportation needs    Medical: Not on file    Non-medical: Not on file  Tobacco Use  . Smoking status: Current Every Day Smoker    Packs/day: 0.50    Years: 18.00    Pack years: 9.00    Types: Cigarettes  . Smokeless tobacco: Never Used  . Tobacco comment: sometimes less. Cutting back  Substance and Sexual Activity  . Alcohol use: Yes    Alcohol/week: 0.0 standard drinks    Comment: occasional  . Drug use: No  . Sexual activity: Yes    Birth control/protection: Condom, Surgical  Lifestyle  . Physical activity    Days per week: Not on file    Minutes per session: Not on file  . Stress: Not on file  Relationships  . Social connections    Talks on phone: More than three times a week    Gets together: Not on file    Attends religious service: Not on file    Active member of club or organization: Not on file    Attends meetings of clubs or organizations: Not on file    Relationship status: Not on file  . Intimate partner violence    Fear of current or ex partner: No    Emotionally abused: No    Physically abused: No    Forced sexual activity: No  Other Topics Concern  . Not on file  Social History Narrative  . Not on file     Physical Exam: General: in no acute distress Neuro: Alert and appropriate Psych: Normal affect and normal insight   Marlei Glomski L. Tarri Glenn, MD, MPH [facility] 08/22/2018, 12:42 PM

## 2018-08-22 NOTE — Progress Notes (Signed)
Pt scheduled for pelvic transvag u/s on September 13 2018 at 1245 pm and to arrive with full bladder.

## 2018-08-22 NOTE — Progress Notes (Signed)
Internal Medicine Clinic Attending  Case discussed with Dr. Dorrell at the time of the visit.  We reviewed the resident's history and exam and pertinent patient test results.  I agree with the assessment, diagnosis, and plan of care documented in the resident's note.    

## 2018-08-23 ENCOUNTER — Ambulatory Visit (INDEPENDENT_AMBULATORY_CARE_PROVIDER_SITE_OTHER): Payer: Medicaid Other | Admitting: Gastroenterology

## 2018-08-23 ENCOUNTER — Encounter: Payer: Self-pay | Admitting: Gastroenterology

## 2018-08-23 VITALS — Ht 63.0 in | Wt 104.0 lb

## 2018-08-23 DIAGNOSIS — D539 Nutritional anemia, unspecified: Secondary | ICD-10-CM | POA: Diagnosis not present

## 2018-08-23 DIAGNOSIS — R933 Abnormal findings on diagnostic imaging of other parts of digestive tract: Secondary | ICD-10-CM | POA: Diagnosis not present

## 2018-08-23 DIAGNOSIS — K319 Disease of stomach and duodenum, unspecified: Secondary | ICD-10-CM | POA: Diagnosis not present

## 2018-08-23 NOTE — Patient Instructions (Addendum)
Complete 8 weeks of omeprazole 40 mg daily. Then, take the omeprazole 40 mg every other day for one week. Then stop all together.  Continue to take the famotidine 20 mg twice daily.  Avoid all nonsteroidal anti-inflammatory medication such as meloxicam and ibuprofen as you are doing.  I would like for you to have some labs performed at your convenience including a blood count, ferritin, and iron level. You may come to the office anytime Monday-Friday between 7:30 am-5:00 pm.  I am delighted that Dr. Ilda Basset is helping evaluate your abnormal CT scan findings.  Let us plan to check in on your symptoms in 3 months.  Please call me with any additional questions or concerns before that time.

## 2018-09-06 DIAGNOSIS — M5136 Other intervertebral disc degeneration, lumbar region: Secondary | ICD-10-CM | POA: Diagnosis not present

## 2018-09-06 DIAGNOSIS — Z981 Arthrodesis status: Secondary | ICD-10-CM | POA: Diagnosis not present

## 2018-09-06 DIAGNOSIS — M47816 Spondylosis without myelopathy or radiculopathy, lumbar region: Secondary | ICD-10-CM | POA: Diagnosis not present

## 2018-09-13 ENCOUNTER — Ambulatory Visit (HOSPITAL_COMMUNITY): Payer: Medicaid Other

## 2018-09-14 ENCOUNTER — Other Ambulatory Visit: Payer: Self-pay | Admitting: *Deleted

## 2018-09-14 DIAGNOSIS — M5417 Radiculopathy, lumbosacral region: Secondary | ICD-10-CM

## 2018-09-14 MED ORDER — HYDROCODONE-ACETAMINOPHEN 5-325 MG PO TABS: 1.0000 | ORAL_TABLET | Freq: Three times a day (TID) | ORAL | 0 refills | Status: DC | PRN

## 2018-09-15 ENCOUNTER — Telehealth: Payer: Self-pay | Admitting: Obstetrics and Gynecology

## 2018-09-15 NOTE — Telephone Encounter (Signed)
Patient called to say her ultrasound appointment was changed, so she needed her visit with the provider to be after the ultrasound.

## 2018-09-19 ENCOUNTER — Telehealth: Payer: Medicaid Other | Admitting: Obstetrics and Gynecology

## 2018-09-29 ENCOUNTER — Other Ambulatory Visit: Payer: Self-pay

## 2018-09-29 ENCOUNTER — Ambulatory Visit (HOSPITAL_COMMUNITY)
Admission: RE | Admit: 2018-09-29 | Discharge: 2018-09-29 | Disposition: A | Discharge status: Home / Self Care | Payer: Medicaid Other | Source: Ambulatory Visit | Attending: Obstetrics and Gynecology | Admitting: Obstetrics and Gynecology

## 2018-09-29 DIAGNOSIS — R1900 Intra-abdominal and pelvic swelling, mass and lump, unspecified site: Secondary | ICD-10-CM | POA: Diagnosis not present

## 2018-09-29 DIAGNOSIS — R1909 Other intra-abdominal and pelvic swelling, mass and lump: Secondary | ICD-10-CM | POA: Diagnosis not present

## 2018-10-06 ENCOUNTER — Other Ambulatory Visit: Payer: Self-pay | Admitting: *Deleted

## 2018-10-06 MED ORDER — ATENOLOL 25 MG PO TABS: ORAL_TABLET | ORAL | 1 refills | Status: DC

## 2018-10-17 ENCOUNTER — Other Ambulatory Visit: Payer: Self-pay | Admitting: *Deleted

## 2018-10-17 DIAGNOSIS — M5417 Radiculopathy, lumbosacral region: Secondary | ICD-10-CM

## 2018-10-17 NOTE — Telephone Encounter (Signed)
Last rx written 09/14/18. Last OV 08/17/18. Next OV has not been scheduled. UDS 01/19/18.

## 2018-10-18 ENCOUNTER — Telehealth: Payer: Self-pay | Admitting: Family Medicine

## 2018-10-18 NOTE — Telephone Encounter (Signed)
Called and left patient a detailed message about her mychart Virtual visit on 10-19-2018.

## 2018-10-19 ENCOUNTER — Other Ambulatory Visit: Payer: Self-pay

## 2018-10-19 ENCOUNTER — Telehealth (INDEPENDENT_AMBULATORY_CARE_PROVIDER_SITE_OTHER): Payer: Medicaid Other | Admitting: Obstetrics and Gynecology

## 2018-10-19 DIAGNOSIS — R1900 Intra-abdominal and pelvic swelling, mass and lump, unspecified site: Secondary | ICD-10-CM | POA: Diagnosis not present

## 2018-10-19 DIAGNOSIS — N83201 Unspecified ovarian cyst, right side: Secondary | ICD-10-CM

## 2018-10-19 NOTE — Progress Notes (Signed)
I connected with  [name] on 10/19/18 at  2:35 PM EDT by telephone and verified that I am speaking with the correct person using two identifiers.   I discussed the limitations, risks, security and privacy concerns of performing an evaluation and management service by telephone and the availability of in person appointments. I also discussed with the patient that there may be a patient responsible charge related to this service. The patient expressed understanding and agreed to proceed.  [place], [place] 10/19/2018  2:18 PM

## 2018-10-19 NOTE — Progress Notes (Signed)
TELEHEALTH VIRTUAL GYNECOLOGY VISIT ENCOUNTER NOTE  I connected with Melissa Walker on 10/19/18 at  2:35 PM EDT by telephone at home and verified that I am speaking with the correct person using two identifiers.   I discussed the limitations, risks, security and privacy concerns of performing an evaluation and management service by telephone and the availability of in person appointments. I also discussed with the patient that there may be a patient responsible charge related to this service. The patient expressed understanding and agreed to proceed.  Chief Complaint: follow up pelvic ultrasound  History:  Melissa Walker is a 38 y.o. G2P2002 (LMP  8/3) being evaluated today for above CC.   Patient seen by me on 6/22 for new patient referral for 6cm pelvic mass seen on CT for work up for GI issues. She was asymptomatic and I recommended getting a pelvic u/s. 7/30 pelvic u/s showed two cysts for combined size of approx 5cm  Patient still asymptomatic.     Past Medical History:  Diagnosis Date   Abnormal uterine bleeding 02/04/2012   Occurred 01/12/12-01/15/12. Evaluated in [facility] and all labs, exam, and pelvic u/s WNL. Given rx for ibuprofen and told to follow up as otupatient.     Anxiety    Pt states she's on cymbalta "to calm me down"   Cholelithiasis 10/26/2014   Chronic back pain    GERD (gastroesophageal reflux disease)    possible PUD; getting workup currently   Hypertension    Inappropriate sinus tachycardia 04/21/2017   Pelvic mass 07/13/2018   6.1 x 2.3 cm posterior pelvic cystic mass noted on CT 05/2018.   PUD (peptic ulcer disease) 07/13/2018   Tobacco abuse    Underweight 10/26/2014   Past Surgical History:  Procedure Laterality Date   DENTAL REHABILITATION     full set dentures   LUMBAR LAMINECTOMY/DECOMPRESSION MICRODISCECTOMY Right 07/23/2014   Procedure: Right Lumbar five-Sacral one laminotomy and microdiskectomy;  Surgeon: Jovita Gamma, MD;   Location: [place];  Service: Neurosurgery;  Laterality: Right;  Right Lumbar five-Sacral one laminotomy and microdiskectomy   Multiple epidural steroid injections in back     Over past 2 years   POSTERIOR LAMINECTOMY / DECOMPRESSION LUMBAR SPINE  06/23/2018   L5-S1 decompression    TUBAL LIGATION Bilateral 05/11/2013   Procedure: POST PARTUM TUBAL LIGATION;  Surgeon: Osborne Oman, MD;  Location: [facility];  Service: Gynecology;  Laterality: Bilateral;   The following portions of the patient's history were reviewed and updated as appropriate: allergies, current medications, past family history, past medical history, past social history, past surgical history and problem list.   Review of Systems:  Pertinent items noted in HPI and remainder of comprehensive ROS otherwise negative.  Physical Exam:   General:  Alert, oriented and cooperative.   Mental Status: Normal mood and affect perceived. Normal judgment and thought content.  Physical exam deferred due to nature of the encounter  Labs and Imaging No results found for this or any previous visit (from the past 336 hour(s)). US Pelvic Complete With Transvaginal  Result Date: 09/29/2018 CLINICAL DATA:  Evaluate pelvic mass on prior CT. EXAM: TRANSABDOMINAL AND TRANSVAGINAL ULTRASOUND OF PELVIS TECHNIQUE: Both transabdominal and transvaginal ultrasound examinations of the pelvis were performed. Transabdominal technique was performed for global imaging of the pelvis including uterus, ovaries, adnexal regions, and pelvic cul-de-sac. It was necessary to proceed with endovaginal exam following the transabdominal exam to visualize the adnexal structures. COMPARISON:  CT abdomen pelvis  05/03/2018 FINDINGS: Uterus Measurements: 7.5 x 4.8 x 4.8 cm = volume: 89.6 mL. No fibroids or other mass visualized. Endometrium Thickness: 13 mm.  No focal abnormality visualized. Right ovary Measurements: 5.1 x 3.7 x 4.4 cm = volume: 43.8 mL. Within the right  ovary there is a 2.8 x 3.3 x 3.6 cm cystic structure. There is an adjacent 2.0 x 2.1 x 2.8 cm cystic structure. Left ovary Measurements: 4.5 x 1.3 x 3.6 cm = volume: 11.6 mL. Resolving corpus luteum. Other findings Small amount of free fluid in the pelvis. IMPRESSION: The right ovary is enlarged and contains 2 cystic structures which may also contain septations or potentially ovarian tissue between the 2 cystic structures. Given the size of the right ovary and the possible complex nature of the cystic lesions, recommend additional follow-up pelvic ultrasound in 6-8 weeks to assess for interval change/resolution. Electronically Signed   By: Lovey Newcomer M.D.   On: 09/29/2018 14:19      Assessment and Plan:  D/w her that likely smaller but unable to 100% say b/c of comparing CT to pelvic ultrasound, but ultrasound isn't concerning. I told her I recommend rpt u/s in 6-8wks during the early part of her cycle. If she becomes symptomatic, cysts get bigger or change in characteristics, I told her I'd recommend surgical intervention but only exp management at this time. Patient amenable to plan. .    I discussed the assessment and treatment plan with the patient. The patient was provided an opportunity to ask questions and all were answered. The patient agreed with the plan and demonstrated an understanding of the instructions.   The patient was advised to call back or seek an in-person evaluation/go to the ED if the symptoms worsen or if the condition fails to improve as anticipated.  I provided 15 minutes of non-face-to-face time during this encounter. The visit was done via a Telephone visit.    Aletha Halim, MD [facility]

## 2018-10-20 NOTE — Telephone Encounter (Signed)
I cannot get the prescription to send through. Is it possible to route this to the attending pool? I saw we were doing that with prior prescriptions.

## 2018-10-21 MED ORDER — HYDROCODONE-ACETAMINOPHEN 5-325 MG PO TABS: 1.0000 | ORAL_TABLET | Freq: Three times a day (TID) | ORAL | 0 refills | Status: DC | PRN

## 2018-10-21 MED ORDER — CYCLOBENZAPRINE HCL 5 MG PO TABS: 5.0000 mg | ORAL_TABLET | Freq: Three times a day (TID) | ORAL | 0 refills | Status: DC | PRN

## 2018-11-14 ENCOUNTER — Other Ambulatory Visit: Payer: Self-pay | Admitting: Obstetrics and Gynecology

## 2018-11-14 ENCOUNTER — Other Ambulatory Visit: Payer: Self-pay

## 2018-11-14 ENCOUNTER — Ambulatory Visit (HOSPITAL_COMMUNITY)
Admission: RE | Admit: 2018-11-14 | Discharge: 2018-11-14 | Disposition: A | Discharge status: Home / Self Care | Payer: Medicaid Other | Source: Ambulatory Visit | Attending: Obstetrics and Gynecology | Admitting: Obstetrics and Gynecology

## 2018-11-14 DIAGNOSIS — R1900 Intra-abdominal and pelvic swelling, mass and lump, unspecified site: Secondary | ICD-10-CM

## 2018-11-14 DIAGNOSIS — N83201 Unspecified ovarian cyst, right side: Secondary | ICD-10-CM | POA: Diagnosis not present

## 2018-11-18 ENCOUNTER — Other Ambulatory Visit: Payer: Self-pay | Admitting: Obstetrics and Gynecology

## 2018-11-18 DIAGNOSIS — N83201 Unspecified ovarian cyst, right side: Secondary | ICD-10-CM

## 2018-11-26 ENCOUNTER — Ambulatory Visit
Admission: EM | Admit: 2018-11-26 | Discharge: 2018-11-26 | Disposition: A | Discharge status: Home / Self Care | Payer: Medicaid Other | Attending: Emergency Medicine | Admitting: Emergency Medicine

## 2018-11-26 ENCOUNTER — Encounter: Payer: Self-pay | Admitting: Emergency Medicine

## 2018-11-26 ENCOUNTER — Other Ambulatory Visit: Payer: Self-pay

## 2018-11-26 DIAGNOSIS — M25521 Pain in right elbow: Secondary | ICD-10-CM

## 2018-11-26 MED ORDER — METHYLPREDNISOLONE SODIUM SUCC 125 MG IJ SOLR: 125.0000 mg | Freq: Once | INTRAMUSCULAR | Status: DC

## 2018-11-26 MED ORDER — METHYLPREDNISOLONE SODIUM SUCC 125 MG IJ SOLR
125.0000 mg | Freq: Once | INTRAMUSCULAR | Status: AC
  Administered 2018-11-26: 10:00:00 125 mg via INTRAMUSCULAR

## 2018-11-26 NOTE — ED Provider Notes (Signed)
[facility]    CSN: [id number] Arrival date & time: 11/26/18  0848      History   Chief Complaint Chief Complaint  Patient presents with  . Arm Pain    HPI Melissa Walker is a 38 y.o. female with history of tobacco use, PAD, hypertension, anxiety presenting for 1 week course of right elbow pain.  States it is worse with certain motions or when she "pushes on it sometimes ".  Patient denies injury, trauma, fever, arthralgias or myalgias.  Denies distal extremity weakness, numbness.  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  States she is tried Tylenol without significant relief: Reporting NSAID allergy.  Denies history of HIV, syphilis, STD: Currently sexually active, though declines STD screening today and denies pelvic or vaginal pain, vaginal discharge, anogenital lesions.   Past Medical History:  Diagnosis Date  . Abnormal uterine bleeding 02/04/2012   Occurred 01/12/12-01/15/12. Evaluated in MAU and all labs, exam, and pelvic u/s WNL. Given rx for ibuprofen and told to follow up as otupatient.    . Anxiety    Pt states she's on cymbalta "to calm me down"  . Cholelithiasis 10/26/2014  . Chronic back pain   . GERD (gastroesophageal reflux disease)    possible PUD; getting workup currently  . Hypertension   . Inappropriate sinus tachycardia 04/21/2017  . Pelvic mass 07/13/2018   6.1 x 2.3 cm posterior pelvic cystic mass noted on CT 05/2018.  Marland Kitchen PUD (peptic ulcer disease) 07/13/2018  . Tobacco abuse   . Underweight 10/26/2014    Patient Active Problem List   Diagnosis Date Noted  . Paronychia of left ring finger 08/17/2018  . Right ovarian cyst 07/13/2018  . PUD (peptic ulcer disease) 07/13/2018  . Inappropriate sinus tachycardia 04/21/2017  . Hypertension 08/10/2016  . Night sweats 03/30/2016  . Thyroid function study abnormality 03/30/2016  . Long-term current use of opiate analgesic 02/27/2016  . Adult  acne 12/04/2014  . Anxiety and depression 10/26/2014  . Healthcare maintenance 10/26/2014  . Underweight 10/26/2014  . Lumbosacral radiculopathy 04/13/2008  . TOBACCO ABUSE 02/02/2006    Past Surgical History:  Procedure Laterality Date  . DENTAL REHABILITATION     full set dentures  . LUMBAR LAMINECTOMY/DECOMPRESSION MICRODISCECTOMY Right 07/23/2014   Procedure: Right Lumbar five-Sacral one laminotomy and microdiskectomy;  Surgeon: Jovita Gamma, MD;  Location: [facility];  Service: Neurosurgery;  Laterality: Right;  Right Lumbar five-Sacral one laminotomy and microdiskectomy  . Multiple epidural steroid injections in back     Over past 2 years  . POSTERIOR LAMINECTOMY / DECOMPRESSION LUMBAR SPINE  06/23/2018   L5-S1 decompression   . TUBAL LIGATION Bilateral 05/11/2013   Procedure: POST PARTUM TUBAL LIGATION;  Surgeon: Osborne Oman, MD;  Location: [facility];  Service: Gynecology;  Laterality: Bilateral;    OB History    Gravida  2   Para  2   Term  2   Preterm      AB      Living  2     SAB      TAB      Ectopic      Multiple      Live Births  2            Home Medications    Prior to Admission medications   Medication Sig Start Date End Date Taking? Authorizing Provider  atenolol (TENORMIN) 25 MG tablet TAKE  1/2 TABLET(12.5 MG) BY MOUTH DAILY 10/06/18   Jose Persia, MD  cyclobenzaprine (FLEXERIL) 5 MG tablet Take 1 tablet (5 mg total) by mouth 3 (three) times daily as needed for muscle spasms. 10/21/18   Aldine Contes, MD  DULoxetine (CYMBALTA) 60 MG capsule TAKE 1 CAPSULE(60 MG) BY MOUTH DAILY 08/22/18   Lucious Groves, DO  famotidine (PEPCID) 20 MG tablet Take 1 tablet (20 mg total) by mouth 2 (two) times daily. 05/30/18   Thornton Park, MD  fluticasone (FLONASE) 50 MCG/ACT nasal spray Place 1 spray into both nostrils daily. Patient taking differently: Place 1 spray into both nostrils daily as needed for allergies.  03/22/18 03/22/19   Alphonzo Grieve, MD  HYDROcodone-acetaminophen (NORCO) 5-325 MG tablet Take 1 tablet by mouth every 8 (eight) hours as needed for moderate pain or severe pain. 10/21/18   Aldine Contes, MD  lisinopril (PRINIVIL,ZESTRIL) 20 MG tablet TAKE 1 TABLET(20 MG) BY MOUTH DAILY Patient taking differently: Take 20 mg by mouth daily.  11/24/17   Alphonzo Grieve, MD  omeprazole (PRILOSEC) 40 MG capsule Take 1 capsule (40 mg total) by mouth daily. 05/26/18   Thornton Park, MD  PROVENTIL HFA 108 (90 Base) MCG/ACT inhaler INHALE 2 PUFFS INTO THE LUNGS EVERY 6 HOURS AS NEEDED FOR WHEEZING OR SHORTNESS OF BREATH Patient taking differently: Inhale 2 puffs into the lungs every 6 (six) hours as needed for wheezing or shortness of breath.  06/17/17   Alphonzo Grieve, MD    Family History Family History  Problem Relation Age of Onset  . Cancer Father        brain tumor   . Eclampsia Maternal Grandmother   . Heart attack Maternal Grandmother   . Cancer Maternal Grandmother        lung  . Heart disease Maternal Grandmother   . Hyperlipidemia Maternal Grandmother   . Hypertension Maternal Grandmother     Social History Social History   Tobacco Use  . Smoking status: Current Every Day Smoker    Packs/day: 0.50    Years: 18.00    Pack years: 9.00    Types: Cigarettes  . Smokeless tobacco: Never Used  . Tobacco comment: sometimes less. Cutting back  Substance Use Topics  . Alcohol use: Yes    Alcohol/week: 0.0 standard drinks    Comment: occasional  . Drug use: No     Allergies   Mobic [meloxicam], Penicillins, and Nicotine   Review of Systems Review of Systems  Constitutional: Negative for fatigue and fever.  Respiratory: Negative for cough and shortness of breath.   Cardiovascular: Negative for chest pain and palpitations.  Musculoskeletal:       Positive for left elbow pain  Neurological: Negative for weakness and numbness.     Physical Exam Triage Vital Signs ED Triage Vitals   Enc Vitals Group     BP      Pulse      Resp      Temp      Temp src      SpO2      Weight      Height      Head Circumference      Peak Flow      Pain Score      Pain Loc      Pain Edu?      Excl. in [place]?    No data found.  Updated Vital Signs BP 114/60 (BP Location: Right Arm)   Pulse 92  Temp (!) 97.5 F (36.4 C) (Oral)   Resp 18   SpO2 95%   Visual Acuity Right Eye Distance:   Left Eye Distance:   Bilateral Distance:    Right Eye Near:   Left Eye Near:    Bilateral Near:     Physical Exam Constitutional:      General: She is not in acute distress. HENT:     Head: Normocephalic and atraumatic.  Eyes:     General: No scleral icterus.    Conjunctiva/sclera: Conjunctivae normal.     Pupils: Pupils are equal, round, and reactive to light.  Cardiovascular:     Rate and Rhythm: Normal rate.  Pulmonary:     Effort: Pulmonary effort is normal. No respiratory distress.  Musculoskeletal:     Comments: No obvious bony deformity of right elbow.  Full active ROM with slight pain reported.  Patient reports to proximal and of ulna.  No epitrochlear adenopathy.  No elbow joint edema, fluctuance, crepitus.  Sensation intact, radial pulses 2+.  5/5 grip strength bilaterally and symmetric.  Skin:    Capillary Refill: Capillary refill takes less than 2 seconds.  Neurological:     General: No focal deficit present.     Mental Status: She is alert.      UC Treatments / Results  Labs (all labs ordered are listed, but only abnormal results are displayed) Labs Reviewed - No data to display  EKG   Radiology No results found.  Procedures Procedures (including critical care time)  Medications Ordered in UC Medications  methylPREDNISolone sodium succinate (SOLU-MEDROL) 125 mg/2 mL injection 125 mg (125 mg Intramuscular Given 11/26/18 0930)    Initial Impression / Assessment and Plan / UC Course  I have reviewed the triage vital signs and the nursing notes.   Pertinent labs & imaging results that were available during my care of the patient were reviewed by me and considered in my medical decision making (see chart for details).     1.  Right elbow pain Patient nontoxic, afebrile without physical evidence or reported injury: Radiography deferred.  Patient given IM Solu-Medrol in office for possible underlying mild bursitis, which she tolerated well.  Return precautions discussed, patient verbalized understanding and is agreeable to plan. Final Clinical Impressions(s) / UC Diagnoses   Final diagnoses:  Right elbow pain     Discharge Instructions     Continue taking Tylenol as needed for pain. Icing 3-4 times daily can also help. Return for worsening pain, swelling, fever, numbness, inability to use her arm.    ED Prescriptions    None     PDMP not reviewed this encounter.   Neldon [place], [place] 11/26/18 [phone]

## 2018-11-26 NOTE — ED Triage Notes (Signed)
Per pt she said for week her elbow has been hurting her. No injury. Pt. Was able to pull her sweater up over her head and move back and forth. Pt has no swelling in her arm or deformity.

## 2018-11-26 NOTE — Discharge Instructions (Addendum)
Continue taking Tylenol as needed for pain. Icing 3-4 times daily can also help. Return for worsening pain, swelling, fever, numbness, inability to use her arm.

## 2018-11-28 ENCOUNTER — Other Ambulatory Visit: Payer: Self-pay | Admitting: *Deleted

## 2018-11-28 DIAGNOSIS — M5417 Radiculopathy, lumbosacral region: Secondary | ICD-10-CM

## 2018-11-28 NOTE — Telephone Encounter (Signed)
Last rx written 10/21/18. Last OV 08/17/18. Next OV has not been scheduled. UDS 01/19/18.

## 2018-11-30 MED ORDER — HYDROCODONE-ACETAMINOPHEN 5-325 MG PO TABS: 1.0000 | ORAL_TABLET | Freq: Three times a day (TID) | ORAL | 0 refills | Status: DC | PRN

## 2018-11-30 NOTE — Telephone Encounter (Signed)
Patient needs to make a follow up visit with me in the next 1-2 months

## 2018-12-06 DIAGNOSIS — M533 Sacrococcygeal disorders, not elsewhere classified: Secondary | ICD-10-CM | POA: Diagnosis not present

## 2018-12-06 DIAGNOSIS — Z981 Arthrodesis status: Secondary | ICD-10-CM | POA: Diagnosis not present

## 2018-12-14 ENCOUNTER — Other Ambulatory Visit: Payer: Self-pay | Admitting: Internal Medicine

## 2018-12-15 NOTE — Telephone Encounter (Signed)
Next appt scheduled 11/3 with PCP.

## 2018-12-26 ENCOUNTER — Other Ambulatory Visit: Payer: Self-pay | Admitting: Internal Medicine

## 2018-12-26 DIAGNOSIS — I10 Essential (primary) hypertension: Secondary | ICD-10-CM

## 2018-12-26 MED ORDER — LISINOPRIL 20 MG PO TABS: ORAL_TABLET | ORAL | 0 refills | Status: DC

## 2019-01-03 ENCOUNTER — Other Ambulatory Visit: Payer: Self-pay

## 2019-01-03 ENCOUNTER — Encounter: Payer: Medicaid Other | Admitting: Internal Medicine

## 2019-01-03 ENCOUNTER — Ambulatory Visit: Payer: Medicaid Other | Admitting: Internal Medicine

## 2019-01-03 ENCOUNTER — Encounter: Payer: Self-pay | Admitting: Internal Medicine

## 2019-01-03 VITALS — BP 127/75 | HR 72 | Temp 98.0°F | Ht 63.0 in | Wt 103.8 lb

## 2019-01-03 DIAGNOSIS — G8929 Other chronic pain: Secondary | ICD-10-CM | POA: Diagnosis not present

## 2019-01-03 DIAGNOSIS — Z79891 Long term (current) use of opiate analgesic: Secondary | ICD-10-CM

## 2019-01-03 DIAGNOSIS — Z9889 Other specified postprocedural states: Secondary | ICD-10-CM

## 2019-01-03 DIAGNOSIS — M5417 Radiculopathy, lumbosacral region: Secondary | ICD-10-CM | POA: Diagnosis not present

## 2019-01-03 DIAGNOSIS — D539 Nutritional anemia, unspecified: Secondary | ICD-10-CM | POA: Diagnosis not present

## 2019-01-03 DIAGNOSIS — K209 Esophagitis, unspecified without bleeding: Secondary | ICD-10-CM | POA: Diagnosis not present

## 2019-01-03 DIAGNOSIS — I10 Essential (primary) hypertension: Secondary | ICD-10-CM

## 2019-01-03 DIAGNOSIS — Z79899 Other long term (current) drug therapy: Secondary | ICD-10-CM | POA: Diagnosis not present

## 2019-01-03 DIAGNOSIS — R000 Tachycardia, unspecified: Secondary | ICD-10-CM | POA: Diagnosis not present

## 2019-01-03 DIAGNOSIS — K279 Peptic ulcer, site unspecified, unspecified as acute or chronic, without hemorrhage or perforation: Secondary | ICD-10-CM

## 2019-01-03 MED ORDER — ATENOLOL 25 MG PO TABS: ORAL_TABLET | ORAL | 1 refills | Status: DC

## 2019-01-03 MED ORDER — HYDROCODONE-ACETAMINOPHEN 5-325 MG PO TABS: 1.0000 | ORAL_TABLET | Freq: Three times a day (TID) | ORAL | 0 refills | Status: DC | PRN

## 2019-01-03 NOTE — Progress Notes (Signed)
   CC: Medication Refill  HPI:  [name] is a 38 y.o. with a PMHx of HTN, chronic back pain on long-term opiates who presents to the clinic for medication refill.   Please see the Encounters tab for problem-based Assessment & Plan regarding status of patient's chronic conditions.  Past Medical History:  Diagnosis Date  . Abnormal uterine bleeding 02/04/2012   Occurred 01/12/12-01/15/12. Evaluated in MAU and all labs, exam, and pelvic u/s WNL. Given rx for ibuprofen and told to follow up as otupatient.    . Anxiety    Pt states she's on cymbalta "to calm me down"  . Cholelithiasis 10/26/2014  . Chronic back pain   . GERD (gastroesophageal reflux disease)    possible PUD; getting workup currently  . Hypertension   . Inappropriate sinus tachycardia 04/21/2017  . Pelvic mass 07/13/2018   6.1 x 2.3 cm posterior pelvic cystic mass noted on CT 05/2018.  Marland Kitchen PUD (peptic ulcer disease) 07/13/2018  . Tobacco abuse   . Underweight 10/26/2014   Review of Systems: Review of Systems  Constitutional: Negative for chills and fever.  Respiratory: Negative for cough and shortness of breath.   Cardiovascular: Negative for chest pain and leg swelling.  Gastrointestinal: Negative for abdominal pain, diarrhea, nausea and vomiting.       No urinary or bowel incontinence  Musculoskeletal: Positive for back pain.  Neurological: Negative for dizziness and focal weakness.    Physical Exam:  Vitals:   01/03/19 1318  BP: 127/75  Pulse: 72  Temp: 98 F (36.7 C)  TempSrc: Oral  SpO2: 100%  Weight: 103 lb 12.8 oz (47.1 kg)  Height: 5\' 3"  (1.6 m)   Physical Exam Vitals signs and nursing note reviewed.  Constitutional:      Appearance: She is normal weight.  Cardiovascular:     Rate and Rhythm: Normal rate and regular rhythm.     Heart sounds: No murmur.  Pulmonary:     Effort: Pulmonary effort is normal. No respiratory distress.     Breath sounds: No wheezing or rales.  Skin:  General: Skin is warm and dry.  Neurological:     General: No focal deficit present.     Mental Status: She is alert and oriented to person, place, and time. Mental status is at baseline.  Psychiatric:        Mood and Affect: Mood normal.        Behavior: Behavior normal.    Assessment & Plan:   See Encounters Tab for problem based charting.  Patient seen with Dr. Dareen Piano

## 2019-01-03 NOTE — Patient Instructions (Signed)
It was nice seeing you today! Thank you for choosing [facility] for your Primary Care.    Today we talked about:   1. Refilling medications: Norco and Atenolol

## 2019-01-05 DIAGNOSIS — D539 Nutritional anemia, unspecified: Secondary | ICD-10-CM | POA: Insufficient documentation

## 2019-01-05 NOTE — Assessment & Plan Note (Signed)
No symptoms recently and feels Atenolol has been helping greatly. No changes today.   Plan:  - Continue Atenolol 25mg  QD

## 2019-01-05 NOTE — Assessment & Plan Note (Signed)
BP well controlled today with current regimen. No changes:   Plan:  - Continue Lisinopril 20mg  QD, Atenolol 25mg  QD

## 2019-01-05 NOTE — Assessment & Plan Note (Signed)
Patient has been following up with GI and had an EGD done in June 2020 that showed Grade A esophagitis but no ulcers. She was instructed at her last visit with GI to take both Omeprazole and Famotidine for 8 weeks and then taper off. She has been able to successfully do this. No abdominal pain today.   Plan: - Continue following GI's recommendations

## 2019-01-05 NOTE — Assessment & Plan Note (Signed)
Melissa Walker is on chronic opioid therapy for chronic pain. The date of the controlled substances contract is referenced in the [place] and / or the overview. Date of pain contract was 11/2015. As part of the treatment plan, the [place] controlled substance database is checked at least twice yearly and the database results are appropriate. I have last reviewed the results on 01/03/2019.   The last UDS was on 12/2017 and results are as expected. Patient needs at least a yearly UDS.   The patient is on hydrocodone/acetaminophen (Lorcet, Lortab, Norco, Vicodin) strength 5-325mg , #90 per 30 days. Adjunctive treatment includes Cymbalta and Flexeril. This regimen allows Melissa Walker to function and does not cause excessive sedation or other side effects.  "The benefits of continuing opioid therapy outweigh the risks and chronic opioids will be continued. Ongoing education about safe opioid treatment is provided  Interventions today include: - ToxAssure - 1 refill sent to pharmacy with instructions to have pharmacy send refill requests every month.

## 2019-01-05 NOTE — Assessment & Plan Note (Signed)
Patient underwent bilateral L5-S1 lumbar decompression including discectomy and stabilization on 06/2018 with Dr. Sherwood Gambler and tolerated the procedure well. She endorses slow improvement in back pain and paresthesia in lower extremities. She follows up with Dr. Sherwood Gambler regularly since follow up and next appointment is in December.

## 2019-01-05 NOTE — Assessment & Plan Note (Signed)
Patient has a history of asymptomatic macrocytic anemia of unknown cause. At her last follow up with GI, blood work was ordered to initiate evaluation with B12, folate, ferritin and repeat CBC. I informed [name] that we could work that up and do the blood work today but she declining, wishing to pursue it with GI. She denies any fatigue, SOB, or changes in chronic paresthesia.   Plan: - Will follow up results when drawn at her next GI visit

## 2019-01-06 LAB — TOXASSURE SELECT,+ANTIDEPR,UR

## 2019-01-06 NOTE — Progress Notes (Signed)
Internal Medicine Clinic Attending  I saw and evaluated the patient.  I personally confirmed the key portions of the history and exam documented by Dr. Basaraba and I reviewed pertinent patient test results.  The assessment, diagnosis, and plan were formulated together and I agree with the documentation in the resident's note.    

## 2019-01-13 NOTE — Addendum Note (Signed)
Addended by: Jose Persia on: 01/13/2019 01:35 PM   Modules accepted: Level of Service

## 2019-01-16 ENCOUNTER — Encounter: Payer: Self-pay | Admitting: Internal Medicine

## 2019-02-03 ENCOUNTER — Other Ambulatory Visit: Payer: Self-pay | Admitting: *Deleted

## 2019-02-03 ENCOUNTER — Encounter: Payer: Self-pay | Admitting: Internal Medicine

## 2019-02-03 DIAGNOSIS — Z79891 Long term (current) use of opiate analgesic: Secondary | ICD-10-CM

## 2019-02-03 DIAGNOSIS — M5417 Radiculopathy, lumbosacral region: Secondary | ICD-10-CM

## 2019-02-05 MED ORDER — CYCLOBENZAPRINE HCL 5 MG PO TABS: 5.0000 mg | ORAL_TABLET | Freq: Three times a day (TID) | ORAL | 0 refills | Status: DC | PRN

## 2019-02-05 MED ORDER — HYDROCODONE-ACETAMINOPHEN 5-325 MG PO TABS: 1.0000 | ORAL_TABLET | Freq: Three times a day (TID) | ORAL | 0 refills | Status: DC | PRN

## 2019-02-10 ENCOUNTER — Telehealth: Payer: Self-pay | Admitting: *Deleted

## 2019-02-10 ENCOUNTER — Telehealth: Payer: Self-pay | Admitting: Internal Medicine

## 2019-02-10 DIAGNOSIS — L02432 Carbuncle of left axilla: Secondary | ICD-10-CM | POA: Diagnosis not present

## 2019-02-10 NOTE — Telephone Encounter (Signed)
Needs refill on HYDROcodone-acetaminophen (NORCO) 5-325 MG tablet  ;pt contact [employer], [place]

## 2019-02-10 NOTE — Telephone Encounter (Addendum)
Information was sent through [employer] for PA for Hydrocodone Acetaminophen 5/325/mg tablets.  Awaiting determination.  Sander Nephew, RN 02/10/2019 11:32 AM.  Call to [place] PA approved 02/10/2019 thru 06/10/2019.  [place] [id number].  I [id number].  Sander Nephew, RN 02/13/2019 9:12 AM.

## 2019-02-10 NOTE — Telephone Encounter (Signed)
#  90 sent to [facility] on 02/05/2019. Confirmed with Anik at [facility] they have this Rx, but it needs a PA. Will forward to PA Staff. Patient notified. Hubbard Hartshorn, BSN, RN-BC

## 2019-02-20 ENCOUNTER — Other Ambulatory Visit: Payer: Self-pay | Admitting: Obstetrics and Gynecology

## 2019-02-20 ENCOUNTER — Ambulatory Visit (HOSPITAL_COMMUNITY)
Admission: RE | Admit: 2019-02-20 | Discharge: 2019-02-20 | Disposition: A | Discharge status: Home / Self Care | Payer: Medicaid Other | Source: Ambulatory Visit | Attending: Obstetrics and Gynecology | Admitting: Obstetrics and Gynecology

## 2019-02-20 ENCOUNTER — Other Ambulatory Visit: Payer: Self-pay

## 2019-02-20 DIAGNOSIS — N83201 Unspecified ovarian cyst, right side: Secondary | ICD-10-CM

## 2019-02-20 DIAGNOSIS — N83291 Other ovarian cyst, right side: Secondary | ICD-10-CM | POA: Diagnosis not present

## 2019-02-20 DIAGNOSIS — L03112 Cellulitis of left axilla: Secondary | ICD-10-CM | POA: Diagnosis not present

## 2019-02-20 DIAGNOSIS — Z09 Encounter for follow-up examination after completed treatment for conditions other than malignant neoplasm: Secondary | ICD-10-CM | POA: Diagnosis not present

## 2019-02-27 ENCOUNTER — Other Ambulatory Visit: Payer: Self-pay

## 2019-02-28 ENCOUNTER — Encounter: Payer: Self-pay | Admitting: Obstetrics and Gynecology

## 2019-02-28 MED ORDER — DULOXETINE HCL 60 MG PO CPEP: ORAL_CAPSULE | ORAL | 5 refills | Status: DC

## 2019-03-10 DIAGNOSIS — Z981 Arthrodesis status: Secondary | ICD-10-CM | POA: Diagnosis not present

## 2019-03-13 ENCOUNTER — Encounter: Payer: Self-pay | Admitting: Internal Medicine

## 2019-03-13 DIAGNOSIS — M5417 Radiculopathy, lumbosacral region: Secondary | ICD-10-CM

## 2019-03-13 DIAGNOSIS — Z79891 Long term (current) use of opiate analgesic: Secondary | ICD-10-CM

## 2019-03-13 MED ORDER — HYDROCODONE-ACETAMINOPHEN 5-325 MG PO TABS: 1.0000 | ORAL_TABLET | Freq: Three times a day (TID) | ORAL | 0 refills | Status: DC | PRN

## 2019-03-13 NOTE — Telephone Encounter (Signed)
Last sent 12/6 Last visit 11/3

## 2019-03-22 ENCOUNTER — Other Ambulatory Visit: Payer: Self-pay | Admitting: Internal Medicine

## 2019-03-22 DIAGNOSIS — I10 Essential (primary) hypertension: Secondary | ICD-10-CM

## 2019-03-24 ENCOUNTER — Encounter: Payer: Self-pay | Admitting: Internal Medicine

## 2019-03-24 MED ORDER — CYCLOBENZAPRINE HCL 5 MG PO TABS: 5.0000 mg | ORAL_TABLET | Freq: Three times a day (TID) | ORAL | 0 refills | Status: DC | PRN

## 2019-04-05 ENCOUNTER — Encounter: Payer: Self-pay | Admitting: Internal Medicine

## 2019-04-05 ENCOUNTER — Ambulatory Visit: Payer: Medicaid Other | Admitting: Internal Medicine

## 2019-04-05 ENCOUNTER — Other Ambulatory Visit: Payer: Self-pay

## 2019-04-05 DIAGNOSIS — L709 Acne, unspecified: Secondary | ICD-10-CM | POA: Diagnosis not present

## 2019-04-05 DIAGNOSIS — Z79899 Other long term (current) drug therapy: Secondary | ICD-10-CM | POA: Diagnosis not present

## 2019-04-05 DIAGNOSIS — I10 Essential (primary) hypertension: Secondary | ICD-10-CM

## 2019-04-05 DIAGNOSIS — G8929 Other chronic pain: Secondary | ICD-10-CM

## 2019-04-05 DIAGNOSIS — R000 Tachycardia, unspecified: Secondary | ICD-10-CM | POA: Diagnosis not present

## 2019-04-05 DIAGNOSIS — M5416 Radiculopathy, lumbar region: Secondary | ICD-10-CM | POA: Diagnosis not present

## 2019-04-05 DIAGNOSIS — Z79891 Long term (current) use of opiate analgesic: Secondary | ICD-10-CM

## 2019-04-05 NOTE — Patient Instructions (Addendum)
It was nice seeing you today! Thank you for choosing [facility] for your Primary Care.    Today we talked about:   1. Pain medications: We filled out a new pain contract today.   2. High heart rate: Please keep a log of things you notice make your heart race.

## 2019-04-05 NOTE — Progress Notes (Signed)
Copy of pain contract given to pt. 

## 2019-04-05 NOTE — Progress Notes (Signed)
   CC:  Back pain follow up  HPI:  Melissa Walker is a 39 y.o. with a PMHx of lumbar radiculopathy, sinus tachycardia, HTN who presents to the clinic for back pain follow up.   Please see the Encounters tab for problem-based Assessment & Plan regarding status of patient's chronic conditions.  Past Medical History:  Diagnosis Date  . Abnormal uterine bleeding 02/04/2012   Occurred 01/12/12-01/15/12. Evaluated in [facility]U and all labs, exam, and pelvic u/s WNL. Given rx for ibuprofen and told to follow up as otupatient.    . Anxiety    Pt states she's on cymbalta "to calm me down"  . Cholelithiasis 10/26/2014  . Chronic back pain   . GERD (gastroesophageal reflux disease)    possible PUD; getting workup currently  . Hypertension   . Inappropriate sinus tachycardia 04/21/2017  . Pelvic mass 07/13/2018   6.1 x 2.3 cm posterior pelvic cystic mass noted on CT 05/2018.  Marland Kitchen PUD (peptic ulcer disease) 07/13/2018  . Tobacco abuse   . Underweight 10/26/2014   Review of Systems: Review of Systems  Cardiovascular: Positive for palpitations (intermittently).  Musculoskeletal: Positive for back pain.  Neurological: Negative for focal weakness.  Psychiatric/Behavioral: Negative for depression and substance abuse. The patient is not nervous/anxious.    Physical Exam:  Vitals:   04/05/19 1512 04/05/19 1550  BP: 133/73   Pulse: (!) 106 100  Temp: 97.9 F (36.6 C)   TempSrc: Oral   SpO2: 100%   Weight: 105 lb 14.4 oz (48 kg)   Height: 5\' 3"  (1.6 m)    Physical Exam Vitals and nursing note reviewed.  Constitutional:      General: She is not in acute distress.    Appearance: She is normal weight.  Cardiovascular:     Rate and Rhythm: Regular rhythm. Tachycardia present.     Pulses: Normal pulses.  Pulmonary:     Effort: Pulmonary effort is normal. No respiratory distress.  Skin:    General: Skin is warm and dry.  Neurological:     General: No focal deficit present.     Mental Status:  She is alert and oriented to person, place, and time. Mental status is at baseline.     Gait: Gait normal.  Psychiatric:        Mood and Affect: Mood normal.        Behavior: Behavior normal.    Assessment & Plan:   See Encounters Tab for problem based charting.  Patient discussed with Dr. Heber Yoder

## 2019-04-05 NOTE — Assessment & Plan Note (Signed)
Melissa Walker continues to note chest acne during her menstrual cycles that are itchy. She has tried Zatzyp without alleviation. Due to scratching, some of the pimples become quite irritated, requiring neosporin.   Plan:  - Recommend she switch to a salicylic acid based acne treatment.

## 2019-04-05 NOTE — Assessment & Plan Note (Addendum)
Melissa Walker is on chronic opioid therapy for chronic pain. The date of the controlled substances contract is referenced in the [place] and / or the overview. New pain contract signed today, 04/05/2019. As part of the treatment plan, the [place] controlled substance database is checked at least twice yearly and the database results are appropriate. I have last reviewed the results on 04/05/2019.   The last UDS was on 01/2019 and results are as expected. Patient needs at least a yearly UDS.   The patient is on hydrocodone/acetaminophen (Lorcet, Lortab, Norco, Vicodin) strength 5-325, 90 per 30 days. Adjunctive treatment includes Cymbalta and Flexeril. This regimen allows [name] to function and does not cause excessive sedation or other side effects.  "The benefits of continuing opioid therapy outweigh the risks and chronic opioids will be continued. Ongoing education about safe opioid treatment is provided  Interventions today include: Next refill required on 04/13/2019 and will be sent electronically.

## 2019-04-05 NOTE — Assessment & Plan Note (Signed)
Ms. Hawkin continues to have intermittent palpitations. Currently known trigger is salty food. She is taking Atenolol 12.5 mg daily.   Discussed that at this time, will continue with current medication regimen but encouraged patient to keep a journal with triggers to help assess cause. If she continues to be tachycardic as she is today, may require escalation to Metoprolol 50 mg.   Plan: - Atenolol 12.5 mg QD - Trigger journal

## 2019-04-09 NOTE — Progress Notes (Signed)
Internal Medicine Clinic Attending  Case discussed with Dr. Basaraba at the time of the visit.  We reviewed the resident's history and exam and pertinent patient test results.  I agree with the assessment, diagnosis, and plan of care documented in the resident's note.    

## 2019-04-11 ENCOUNTER — Other Ambulatory Visit: Payer: Self-pay | Admitting: *Deleted

## 2019-04-11 ENCOUNTER — Encounter: Payer: Self-pay | Admitting: Internal Medicine

## 2019-04-11 DIAGNOSIS — Z79891 Long term (current) use of opiate analgesic: Secondary | ICD-10-CM

## 2019-04-11 DIAGNOSIS — M5417 Radiculopathy, lumbosacral region: Secondary | ICD-10-CM

## 2019-04-11 MED ORDER — HYDROCODONE-ACETAMINOPHEN 5-325 MG PO TABS: 1.0000 | ORAL_TABLET | Freq: Three times a day (TID) | ORAL | 0 refills | Status: DC | PRN

## 2019-05-11 ENCOUNTER — Encounter: Payer: Self-pay | Admitting: Internal Medicine

## 2019-05-11 DIAGNOSIS — Z79891 Long term (current) use of opiate analgesic: Secondary | ICD-10-CM

## 2019-05-11 DIAGNOSIS — M5417 Radiculopathy, lumbosacral region: Secondary | ICD-10-CM

## 2019-05-12 MED ORDER — HYDROCODONE-ACETAMINOPHEN 5-325 MG PO TABS: 1.0000 | ORAL_TABLET | Freq: Three times a day (TID) | ORAL | 0 refills | Status: DC | PRN

## 2019-05-12 NOTE — Telephone Encounter (Signed)
Last rx written 04/11/19. Last OV 01/03/19. Next OV has not been scheduled. UDS 01/03/19.

## 2019-05-19 ENCOUNTER — Encounter: Payer: Self-pay | Admitting: Internal Medicine

## 2019-05-21 ENCOUNTER — Other Ambulatory Visit: Payer: Self-pay | Admitting: Internal Medicine

## 2019-05-21 MED ORDER — CYCLOBENZAPRINE HCL 5 MG PO TABS: 5.0000 mg | ORAL_TABLET | Freq: Three times a day (TID) | ORAL | 0 refills | Status: DC | PRN

## 2019-06-06 ENCOUNTER — Encounter: Payer: Self-pay | Admitting: Internal Medicine

## 2019-06-14 ENCOUNTER — Encounter: Payer: Self-pay | Admitting: Internal Medicine

## 2019-06-14 DIAGNOSIS — Z79891 Long term (current) use of opiate analgesic: Secondary | ICD-10-CM

## 2019-06-14 DIAGNOSIS — M5417 Radiculopathy, lumbosacral region: Secondary | ICD-10-CM

## 2019-06-14 NOTE — Telephone Encounter (Signed)
Last rx written 05/12/19. Last OV 04/05/19. Next OV has not been scheduled. UDS 01/30/19.

## 2019-06-15 ENCOUNTER — Telehealth: Payer: Self-pay | Admitting: *Deleted

## 2019-06-15 MED ORDER — HYDROCODONE-ACETAMINOPHEN 5-325 MG PO TABS: 1.0000 | ORAL_TABLET | Freq: Three times a day (TID) | ORAL | 0 refills | Status: DC | PRN

## 2019-06-15 NOTE — Telephone Encounter (Addendum)
Information was faxed to [employer] [place] for Oxycodone-Acetaminophen.  Awaiting determination.  Sander Nephew, RN 06/15/2019 4:8 PM.  PA for Hydrocodone Acetaminophen was approved 06/16/2019 thru 12/13/2019.  [place] [id number].  [id number].  Sander Nephew, RN 06/20/19 1:46 PM.

## 2019-06-19 ENCOUNTER — Other Ambulatory Visit: Payer: Self-pay | Admitting: *Deleted

## 2019-06-19 DIAGNOSIS — I10 Essential (primary) hypertension: Secondary | ICD-10-CM

## 2019-06-20 MED ORDER — LISINOPRIL 20 MG PO TABS: 20.0000 mg | ORAL_TABLET | Freq: Every day | ORAL | 1 refills | Status: DC

## 2019-07-04 DIAGNOSIS — Z23 Encounter for immunization: Secondary | ICD-10-CM | POA: Diagnosis not present

## 2019-07-11 ENCOUNTER — Encounter: Payer: Self-pay | Admitting: Internal Medicine

## 2019-07-11 ENCOUNTER — Other Ambulatory Visit: Payer: Self-pay | Admitting: *Deleted

## 2019-07-11 DIAGNOSIS — I10 Essential (primary) hypertension: Secondary | ICD-10-CM

## 2019-07-11 MED ORDER — ATENOLOL 25 MG PO TABS: ORAL_TABLET | ORAL | 1 refills | Status: DC

## 2019-07-11 MED ORDER — CYCLOBENZAPRINE HCL 5 MG PO TABS: 5.0000 mg | ORAL_TABLET | Freq: Three times a day (TID) | ORAL | 0 refills | Status: DC | PRN

## 2019-07-17 ENCOUNTER — Other Ambulatory Visit: Payer: Self-pay | Admitting: *Deleted

## 2019-07-17 ENCOUNTER — Encounter: Payer: Self-pay | Admitting: Internal Medicine

## 2019-07-17 DIAGNOSIS — Z79891 Long term (current) use of opiate analgesic: Secondary | ICD-10-CM

## 2019-07-17 DIAGNOSIS — M5417 Radiculopathy, lumbosacral region: Secondary | ICD-10-CM

## 2019-07-17 MED ORDER — HYDROCODONE-ACETAMINOPHEN 5-325 MG PO TABS: 1.0000 | ORAL_TABLET | Freq: Three times a day (TID) | ORAL | 0 refills | Status: DC | PRN

## 2019-07-21 NOTE — Telephone Encounter (Signed)
LMOM for patient to call back for a sooner appt.  The patient also has been sch with her PCP on 05/30/2019 @ 3:45 pm.

## 2019-08-15 ENCOUNTER — Encounter: Payer: Self-pay | Admitting: Internal Medicine

## 2019-08-15 DIAGNOSIS — Z79891 Long term (current) use of opiate analgesic: Secondary | ICD-10-CM

## 2019-08-15 DIAGNOSIS — M5417 Radiculopathy, lumbosacral region: Secondary | ICD-10-CM

## 2019-08-15 MED ORDER — CYCLOBENZAPRINE HCL 5 MG PO TABS: 5.0000 mg | ORAL_TABLET | Freq: Three times a day (TID) | ORAL | 0 refills | Status: DC | PRN

## 2019-08-15 MED ORDER — HYDROCODONE-ACETAMINOPHEN 5-325 MG PO TABS: 1.0000 | ORAL_TABLET | Freq: Three times a day (TID) | ORAL | 0 refills | Status: DC | PRN

## 2019-08-30 ENCOUNTER — Ambulatory Visit: Payer: Medicaid Other | Admitting: Internal Medicine

## 2019-08-30 ENCOUNTER — Encounter: Payer: Self-pay | Admitting: Internal Medicine

## 2019-08-30 ENCOUNTER — Other Ambulatory Visit: Payer: Self-pay

## 2019-08-30 DIAGNOSIS — I10 Essential (primary) hypertension: Secondary | ICD-10-CM | POA: Diagnosis not present

## 2019-08-30 DIAGNOSIS — Z0000 Encounter for general adult medical examination without abnormal findings: Secondary | ICD-10-CM

## 2019-08-30 DIAGNOSIS — M5417 Radiculopathy, lumbosacral region: Secondary | ICD-10-CM | POA: Diagnosis not present

## 2019-08-30 DIAGNOSIS — Z79891 Long term (current) use of opiate analgesic: Secondary | ICD-10-CM | POA: Diagnosis not present

## 2019-08-30 MED ORDER — LISINOPRIL 20 MG PO TABS: 20.0000 mg | ORAL_TABLET | Freq: Every day | ORAL | 1 refills | Status: DC

## 2019-08-30 MED ORDER — DULOXETINE HCL 60 MG PO CPEP: ORAL_CAPSULE | ORAL | 1 refills | Status: DC

## 2019-08-30 MED ORDER — HYDROCODONE-ACETAMINOPHEN 5-325 MG PO TABS: 1.0000 | ORAL_TABLET | Freq: Three times a day (TID) | ORAL | 0 refills | Status: DC | PRN

## 2019-08-30 MED ORDER — ATENOLOL 25 MG PO TABS: ORAL_TABLET | ORAL | 1 refills | Status: DC

## 2019-08-30 MED ORDER — CYCLOBENZAPRINE HCL 5 MG PO TABS: 5.0000 mg | ORAL_TABLET | Freq: Three times a day (TID) | ORAL | 1 refills | Status: DC | PRN

## 2019-08-30 NOTE — Assessment & Plan Note (Signed)
Pap smear with OB/GYN per patient preference

## 2019-08-30 NOTE — Assessment & Plan Note (Signed)
Melissa Walker states that she continues to have lower back pain that is unchanged in nature and without significant improvement.  She notes that since the surgery, she no longer has shooting pain down her legs or paresthesias in her legs.  She notes her previous neurosurgeon, Dr. Sherwood Gambler, has recently retired but she has an appointment on July 9 with her new neurosurgeon.  At this time she continues to use Norco, duloxetine, and Flexeril daily to keep her pain well controlled and allow her to perform her ADLs.  Assessment/plan: Discussed with Melissa Walker today that eventually we will need to work on weaning her chronic pain regimen.  We will wait to see how her visit with her new neurosurgeon goes.  At this time no medication changes planned.  -Refilled duloxetine -Refilled Flexeril -Refilled Norco, please see below for more details

## 2019-08-30 NOTE — Assessment & Plan Note (Signed)
BP: 125/62   Denies any concerns with her medications at this time.  Denies any chest pain, shortness of breath, palpitations.  Assessment/plan: Well-controlled at this time. No medications planned for today.  -Continue lisinopril 20 mg daily and atenolol 25 mg daily

## 2019-08-30 NOTE — Progress Notes (Signed)
   CC: Back pain follow up  HPI:  Melissa Walker is a 39 y.o. with a PMHx as listed below who presents to the clinic for back pain follow up.    Please see the Encounters tab for problem-based Assessment & Plan regarding status of patient's chronic conditions.  Past Medical History:  Diagnosis Date  . Abnormal uterine bleeding 02/04/2012   Occurred 01/12/12-01/15/12. Evaluated in [facility] and all labs, exam, and pelvic u/s WNL. Given rx for ibuprofen and told to follow up as otupatient.    . Anxiety    Pt states she's on cymbalta "to calm me down"  . Cholelithiasis 10/26/2014  . Chronic back pain   . GERD (gastroesophageal reflux disease)    possible PUD; getting workup currently  . Hypertension   . Inappropriate sinus tachycardia 04/21/2017  . Pelvic mass 07/13/2018   6.1 x 2.3 cm posterior pelvic cystic mass noted on CT 05/2018.  Marland Kitchen PUD (peptic ulcer disease) 07/13/2018  . Tobacco abuse   . Underweight 10/26/2014   Review of Systems: Review of Systems  Constitutional: Negative for chills and fever.  Gastrointestinal: Negative for abdominal pain, diarrhea, nausea and vomiting.  Musculoskeletal: Positive for back pain. Negative for falls and myalgias.  Neurological: Negative for dizziness and headaches.    Physical Exam:  Vitals:   08/30/19 1548  BP: 125/62  Pulse: 90  Temp: 98 F (36.7 C)  TempSrc: Oral  SpO2: 100%  Weight: 109 lb 8 oz (49.7 kg)  Height: 5\' 3"  (1.6 m)   Physical Exam Vitals and nursing note reviewed.  Constitutional:      General: She is not in acute distress.    Appearance: She is normal weight.  Pulmonary:     Effort: Pulmonary effort is normal. No respiratory distress.  Skin:    General: Skin is warm and dry.  Neurological:     General: No focal deficit present.     Mental Status: She is alert and oriented to person, place, and time. Mental status is at baseline.  Psychiatric:        Mood and Affect: Mood normal.        Behavior: Behavior  normal.    Assessment & Plan:   See Encounters Tab for problem based charting.  Patient discussed with Dr. Rebeca Alert

## 2019-08-30 NOTE — Assessment & Plan Note (Signed)
Melissa Walker is on chronic opioid therapy for chronic pain. The date of the controlled substances contract is referenced in the [place] and / or the overview. Date of pain contract was 04/05/2019. As part of the treatment plan, the [place] controlled substance database is checked at least twice yearly and the database results are appropriate. I have last reviewed the results on 08/30/2019.   The last UDS was on 01/2019 and results are as expected. Patient needs at least a yearly UDS.   The patient is on hydrocodone/acetaminophen (Lorcet, Lortab, Norco, Vicodin) strength 5 mg, #90 per 30 days. Adjunctive treatment includes Cymbalta and Muscle relaxants. This regimen allows Melissa Walker to function and does not cause excessive sedation or other side effects.  "The benefits of continuing opioid therapy outweigh the risks and chronic opioids will be continued. Ongoing education about safe opioid treatment is provided  Interventions today include: 1 refill sent to pharmacy electronically

## 2019-08-30 NOTE — Patient Instructions (Signed)
It was nice seeing you today! Thank you for choosing [facility] for your Primary Care.    Today we talked about:   1. Refills sent to the pharmacy  2. Please contact Dr. Ilda Basset to schedule appointment for Pap Smear

## 2019-08-31 NOTE — Progress Notes (Signed)
Internal Medicine Clinic Attending  Case discussed with Dr. Basaraba at the time of the visit.  We reviewed the resident's history and exam and pertinent patient test results.  I agree with the assessment, diagnosis, and plan of care documented in the resident's note.  Talyn Dessert, M.D., Ph.D.  

## 2019-09-11 ENCOUNTER — Telehealth: Payer: Self-pay | Admitting: *Deleted

## 2019-09-11 DIAGNOSIS — Z79891 Long term (current) use of opiate analgesic: Secondary | ICD-10-CM

## 2019-09-11 MED ORDER — HYDROCODONE-ACETAMINOPHEN 5-325 MG PO TABS: 1.0000 | ORAL_TABLET | Freq: Three times a day (TID) | ORAL | 0 refills | Status: DC | PRN

## 2019-09-11 NOTE — Telephone Encounter (Signed)
Anik, pharmacist at [facility] calls and states as she entered the script for hydrocodone 5/325mg  the system shut down and the script is gone, it needs to be sent again.

## 2019-09-11 NOTE — Telephone Encounter (Signed)
Refill script for 30 days (90 tabs) sent to [facility] on [facility] [facility] [address] [facility].

## 2019-10-07 ENCOUNTER — Encounter: Payer: Self-pay | Admitting: Internal Medicine

## 2019-10-16 ENCOUNTER — Other Ambulatory Visit: Payer: Self-pay | Admitting: *Deleted

## 2019-10-16 ENCOUNTER — Encounter: Payer: Self-pay | Admitting: Internal Medicine

## 2019-10-16 DIAGNOSIS — Z79891 Long term (current) use of opiate analgesic: Secondary | ICD-10-CM

## 2019-10-16 MED ORDER — HYDROCODONE-ACETAMINOPHEN 5-325 MG PO TABS: 1.0000 | ORAL_TABLET | Freq: Three times a day (TID) | ORAL | 0 refills | Status: DC | PRN

## 2019-10-23 ENCOUNTER — Other Ambulatory Visit: Payer: Self-pay | Admitting: Internal Medicine

## 2019-10-23 DIAGNOSIS — Z79891 Long term (current) use of opiate analgesic: Secondary | ICD-10-CM

## 2019-10-23 MED ORDER — HYDROCODONE-ACETAMINOPHEN 5-325 MG PO TABS: 1.0000 | ORAL_TABLET | Freq: Three times a day (TID) | ORAL | 0 refills | Status: DC | PRN

## 2019-10-23 NOTE — Telephone Encounter (Signed)
Rx sent. Thanks

## 2019-10-23 NOTE — Telephone Encounter (Signed)
Pt calling back about her medication Refill for the following:  HYDROcodone-acetaminophen ([place]) 5-325 MG tablet  [facility] states someone needs to call the pharmacy back about the Dr.'s information.

## 2019-10-23 NOTE — Telephone Encounter (Signed)
Call placed to [facility]. Rx for hydrocodone was written by Intern who is not yet registered with [facility]. They were unable to accept name of Attending from 10/16/2019 AM. They are requesting the Rx be resent. Hubbard Hartshorn, BSN, RN-BC

## 2019-10-23 NOTE — Telephone Encounter (Signed)
Pls contact pt regarding medicine [phone]

## 2019-10-23 NOTE — Telephone Encounter (Signed)
Lm for rtc 

## 2019-11-13 DIAGNOSIS — I10 Essential (primary) hypertension: Secondary | ICD-10-CM | POA: Diagnosis not present

## 2019-11-13 DIAGNOSIS — Z981 Arthrodesis status: Secondary | ICD-10-CM | POA: Diagnosis not present

## 2019-11-14 ENCOUNTER — Other Ambulatory Visit: Payer: Self-pay | Admitting: Neurosurgery

## 2019-11-14 DIAGNOSIS — Z981 Arthrodesis status: Secondary | ICD-10-CM

## 2019-11-20 ENCOUNTER — Encounter: Payer: Self-pay | Admitting: Internal Medicine

## 2019-11-20 DIAGNOSIS — Z79891 Long term (current) use of opiate analgesic: Secondary | ICD-10-CM

## 2019-11-20 MED ORDER — HYDROCODONE-ACETAMINOPHEN 5-325 MG PO TABS: 1.0000 | ORAL_TABLET | Freq: Three times a day (TID) | ORAL | 0 refills | Status: DC | PRN

## 2019-11-20 NOTE — Telephone Encounter (Signed)
PDMP reviewed, last refill for hydrocodone-acetaminophen 5-325mg  90 tablets, every 8 hours as needed on 10/23/2019. Will prescribe hydrocodone-acetaminophen 5-325mg , one tablet every 8 hours as needed for pain, #90 tablets, available on 11/23/2019.

## 2019-12-04 ENCOUNTER — Ambulatory Visit
Admission: RE | Admit: 2019-12-04 | Discharge: 2019-12-04 | Disposition: A | Discharge status: Home / Self Care | Payer: Medicaid Other | Source: Ambulatory Visit | Attending: Neurosurgery | Admitting: Neurosurgery

## 2019-12-04 DIAGNOSIS — Z981 Arthrodesis status: Secondary | ICD-10-CM

## 2019-12-04 DIAGNOSIS — M545 Low back pain, unspecified: Secondary | ICD-10-CM | POA: Diagnosis not present

## 2019-12-18 DIAGNOSIS — M5136 Other intervertebral disc degeneration, lumbar region: Secondary | ICD-10-CM | POA: Diagnosis not present

## 2019-12-18 DIAGNOSIS — Z981 Arthrodesis status: Secondary | ICD-10-CM | POA: Diagnosis not present

## 2019-12-18 DIAGNOSIS — I10 Essential (primary) hypertension: Secondary | ICD-10-CM | POA: Diagnosis not present

## 2019-12-20 IMAGING — XA Imaging study
1 series · 1 of 1 positions shown · non-contrast
Comparison: none

CLINICAL DATA: Lumbosacral spondylosis without myelopathy. Previous
disc surgery L5-S1. Recurrent back and right lower extremity pain.

[Series 1: ortho standard · 1 of 1 slices shown]
[im 1/1]
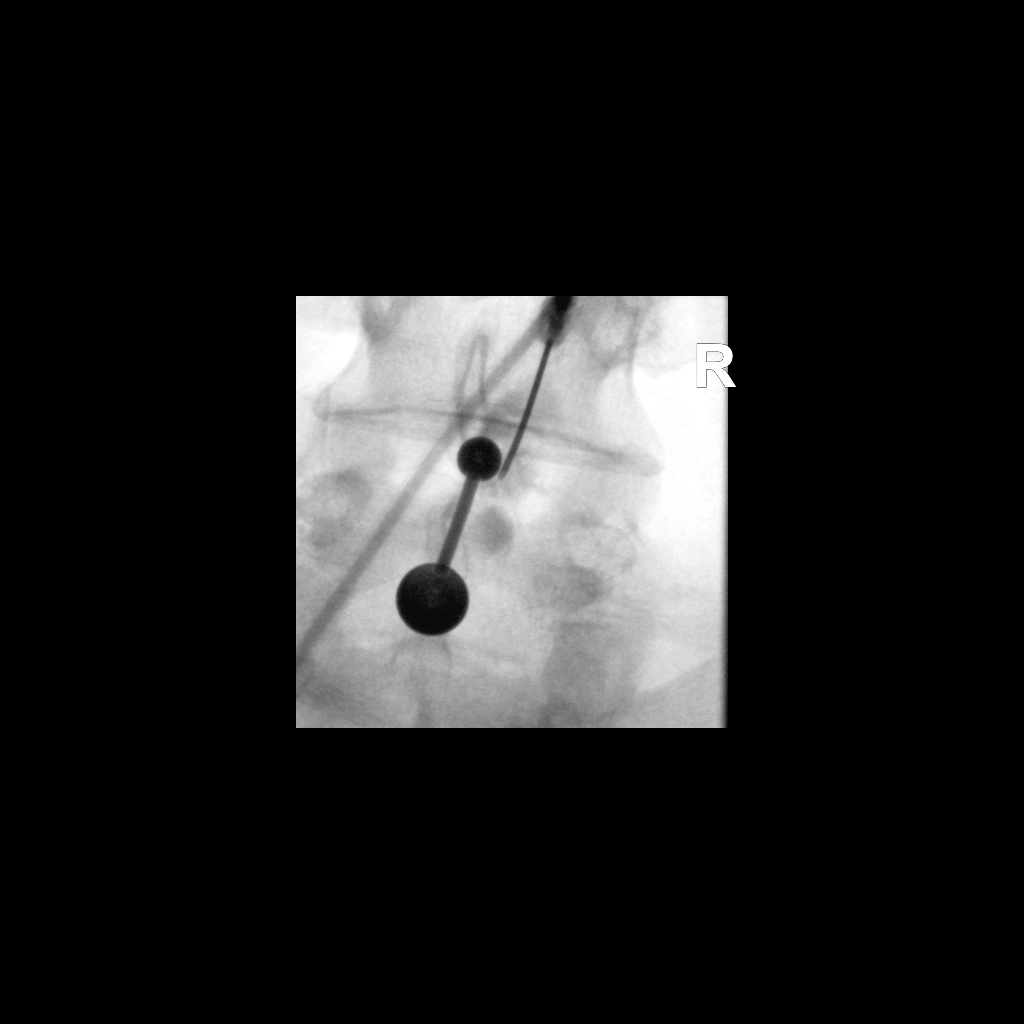

[1 of 1 positions shown; findings below may reference images not displayed]

EXAM:
LUMBAR EPIDURAL INJECTION:

DIAGNOSTIC EPIDURAL INJECTION:

THERAPEUTIC EPIDURAL INJECTION:

PROCEDURE:
The procedure, risks, benefits, and alternatives were explained to
the patient. Questions regarding the procedure were encouraged and
answered. The patient understands and consents to the procedure.

An interlaminar approach was performed on right at L4-5. The
overlying skin was cleansed and anesthetized. A 20 gauge epidural
needle was advanced using loss-of-resistance technique.

Injection of Isovue-M 200 shows a good epidural pattern with spread
above and below the level of needle placement, primarily on the
right no vascular opacification is seen.

120mg of Depo-Medrol mixed with 2ml lidocaine 1% were instilled. The
procedure was well-tolerated, and the patient was discharged thirty
minutes following the injection in good condition.

FLUOROSCOPY TIME:  10 seconds; 8 uKymD DAP

COMPLICATIONS:
None immediate
IMPRESSION: Technically successful epidural injection on the right at L4-5.

## 2019-12-24 ENCOUNTER — Encounter: Payer: Self-pay | Admitting: Internal Medicine

## 2019-12-24 DIAGNOSIS — M5417 Radiculopathy, lumbosacral region: Secondary | ICD-10-CM

## 2019-12-24 DIAGNOSIS — Z79891 Long term (current) use of opiate analgesic: Secondary | ICD-10-CM

## 2019-12-25 MED ORDER — CYCLOBENZAPRINE HCL 5 MG PO TABS: 5.0000 mg | ORAL_TABLET | Freq: Three times a day (TID) | ORAL | 0 refills | Status: DC | PRN

## 2019-12-25 MED ORDER — HYDROCODONE-ACETAMINOPHEN 5-325 MG PO TABS: 1.0000 | ORAL_TABLET | Freq: Three times a day (TID) | ORAL | 0 refills | Status: DC | PRN

## 2020-01-01 ENCOUNTER — Telehealth: Payer: Self-pay

## 2020-01-01 NOTE — Telephone Encounter (Signed)
HYDROcodone-acetaminophen (NORCO) 5-325 MG tablet, REFILL REQUEST @  [id number] [address], [place] [address] Phone:  [phone]  Fax:  [phone]

## 2020-01-01 NOTE — Telephone Encounter (Signed)
Per pharmacy pt needs PA, insurance will only allow 9 day dispense otherwise

## 2020-01-01 NOTE — Telephone Encounter (Signed)
PA was request faxed to [facility]  Awaiting determination.

## 2020-01-01 NOTE — Telephone Encounter (Signed)
RX was sent on 12/25/19 by Dr. Truman Hayward. TC to pharmacy, was told RX was at pharmacy, but they needed a PA. Will forward to [employer], RN,BSN

## 2020-01-03 ENCOUNTER — Telehealth: Payer: Self-pay | Admitting: *Deleted

## 2020-01-03 NOTE — Telephone Encounter (Signed)
Information was faxed to [facility] on 01/01/2020 for PA for Hydrocodone Acetaminophen.  Awaiting decision.  Sander Nephew, RN 11/1/20212 3;15 PM. Call to [facility] PA was approved on 11/1/ 2021.  Sander Nephew, RN 01/03/2020 10:30 AM.

## 2020-01-08 ENCOUNTER — Encounter: Payer: Self-pay | Admitting: Internal Medicine

## 2020-01-08 ENCOUNTER — Other Ambulatory Visit: Payer: Self-pay

## 2020-01-08 ENCOUNTER — Ambulatory Visit (INDEPENDENT_AMBULATORY_CARE_PROVIDER_SITE_OTHER): Payer: Medicaid Other | Admitting: Internal Medicine

## 2020-01-08 VITALS — BP 145/88 | HR 79 | Temp 97.7°F | Ht 63.0 in | Wt 113.2 lb

## 2020-01-08 DIAGNOSIS — Z79891 Long term (current) use of opiate analgesic: Secondary | ICD-10-CM

## 2020-01-08 DIAGNOSIS — F419 Anxiety disorder, unspecified: Secondary | ICD-10-CM | POA: Diagnosis not present

## 2020-01-08 DIAGNOSIS — I10 Essential (primary) hypertension: Secondary | ICD-10-CM

## 2020-01-08 DIAGNOSIS — M5417 Radiculopathy, lumbosacral region: Secondary | ICD-10-CM | POA: Diagnosis not present

## 2020-01-08 DIAGNOSIS — F32A Depression, unspecified: Secondary | ICD-10-CM | POA: Diagnosis not present

## 2020-01-08 DIAGNOSIS — G4700 Insomnia, unspecified: Secondary | ICD-10-CM | POA: Diagnosis not present

## 2020-01-08 MED ORDER — FAMOTIDINE 20 MG PO TABS: 20.0000 mg | ORAL_TABLET | Freq: Every day | ORAL | 3 refills | Status: DC | PRN

## 2020-01-08 MED ORDER — ATENOLOL 25 MG PO TABS: ORAL_TABLET | ORAL | 1 refills | Status: DC

## 2020-01-08 MED ORDER — TRAZODONE HCL 50 MG PO TABS: 25.0000 mg | ORAL_TABLET | Freq: Every day | ORAL | 0 refills | Status: DC

## 2020-01-08 MED ORDER — LISINOPRIL-HYDROCHLOROTHIAZIDE 20-12.5 MG PO TABS: 1.0000 | ORAL_TABLET | Freq: Every day | ORAL | 2 refills | Status: DC

## 2020-01-08 MED ORDER — DULOXETINE HCL 60 MG PO CPEP: ORAL_CAPSULE | ORAL | 1 refills | Status: DC

## 2020-01-08 NOTE — Assessment & Plan Note (Addendum)
Melissa Walker notes approximately 6 to 58-month history of insomnia.  She describes as difficulty falling asleep due to anxiety and being overwhelmed with her mother's cancer diagnosis.  She is requesting a prescription for trazodone, stating it is worked well for her mother.  We discussed her depression, and she feels that it is relatively well controlled given her mother's cancer diagnosis and the recent unexpected death of her grandfather.  She denies any SI.  She feels like her anxiety is all related to her mother's diagnosis.  Assessment/plan: We will trial a short-term course of trazodone, in addition to working on sleep hygiene.  In the future, consider referral to [facility] for possible CBT.   -Trazodone 25 mg before bedtime -Sleep hygiene pamphlet provided -1 month follow-up

## 2020-01-08 NOTE — Assessment & Plan Note (Signed)
Office Visit from 01/08/2020 in [facility]  PHQ-9 Total Score 6     Melissa Walker states she feels her depression is relatively well controlled in light of her mother's bladder cancer diagnosis and her grandfather's recent unexpected death.  She feels like the duloxetine does help with her depression quite a bit and would like to continue taking it at this time.  Assessment/plan: PHQ-9 of 6 today is an increase from her previous PHQ-9's, attribute it to her disruption in her sleep.  Please see insomnia tab for further details.  -Refilled duloxetine

## 2020-01-08 NOTE — Assessment & Plan Note (Addendum)
Melissa Walker states that she continues to take her lisinopril daily and has been recording her blood pressure at home.  She states her average systolic is around 012-379.  She denies any chest pain, shortness of breath, or leg swelling.  Assessment/plan: Continues to be uncontrolled, although I suspect this morning's elevation is due to her acute distress with her grandfather passing over the weekend.  We discussed that goal blood pressure for her is 120/80 and for that we will need to increase her regimen.  Melissa Walker is in agreement with that plan.  -Discontinue lisinopril -Start lisinopril-HCTZ -Continue checking her blood pressure at home and keep a log -1 month follow-up

## 2020-01-08 NOTE — Progress Notes (Signed)
   CC: HTN, chronic opioid use   HPI:  Melissa Walker is a 39 y.o. with a PMHx as listed below who presents to the clinic for HTN, chronic opioid use .   Please see the Encounters tab for problem-based Assessment & Plan regarding status of patient's acute and chronic conditions.  Past Medical History:  Diagnosis Date  . Abnormal uterine bleeding 02/04/2012   Occurred 01/12/12-01/15/12. Evaluated in [facility]U and all labs, exam, and pelvic u/s WNL. Given rx for ibuprofen and told to follow up as otupatient.    . Anxiety    Pt states she's on cymbalta "to calm me down"  . Cholelithiasis 10/26/2014  . Chronic back pain   . GERD (gastroesophageal reflux disease)    possible PUD; getting workup currently  . Hypertension   . Inappropriate sinus tachycardia 04/21/2017  . Pelvic mass 07/13/2018   6.1 x 2.3 cm posterior pelvic cystic mass noted on CT 05/2018.  Marland Kitchen PUD (peptic ulcer disease) 07/13/2018  . Tobacco abuse   . Underweight 10/26/2014   Review of Systems: Review of Systems  Constitutional: Negative for chills and fever.  Respiratory: Negative for cough and shortness of breath.   Cardiovascular: Negative for chest pain and leg swelling.  Gastrointestinal: Negative for abdominal pain, diarrhea, nausea and vomiting.  Neurological: Negative for dizziness.  Psychiatric/Behavioral: Positive for depression ("well controlled"). Negative for substance abuse and suicidal ideas. The patient is nervous/anxious and has insomnia.    Physical Exam:  Vitals:   01/08/20 1014  BP: (!) 145/88  Pulse: 79  Temp: 97.7 F (36.5 C)  TempSrc: Oral  SpO2: 99%  Weight: 113 lb 3.2 oz (51.3 kg)  Height: 5\' 3"  (1.6 m)   Physical Exam Vitals and nursing note reviewed.  Constitutional:      General: She is not in acute distress.    Appearance: She is normal weight.  HENT:     Head: Normocephalic and atraumatic.  Pulmonary:     Effort: Pulmonary effort is normal. No respiratory distress.  Skin:     General: Skin is warm and dry.  Neurological:     General: No focal deficit present.     Mental Status: She is alert and oriented to person, place, and time. Mental status is at baseline.  Psychiatric:        Mood and Affect: Mood normal.        Behavior: Behavior normal.        Thought Content: Thought content normal.        Judgment: Judgment normal.    Assessment & Plan:   See Encounters Tab for problem based charting.  Patient discussed with Dr. Rebeca Alert

## 2020-01-08 NOTE — Assessment & Plan Note (Signed)
Melissa Walker is on chronic opioid therapy for chronic pain. The date of the controlled substances contract is referenced in the [place] and / or the overview. Pain contract signed on 04/05/2019. As part of the treatment plan, the [place] controlled substance database is checked at least twice yearly and the database results are appropriate. I have last reviewed the results on 01/08/2020.   The last UDS was on 01/2019 and results are as expected. Patient needs at least a yearly UDS.  UDS will be rechecked today, however patient has been unable to obtain her medications for the past 2 weeks due to requirement of preauthorization.  Due to this, the expected results will be the absence of Norco and Flexeril.  The patient is on hydrocodone/acetaminophen (Lorcet, Lortab, Norco, Vicodin) strength 5-325, 90 per 30 days. Adjunctive treatment includes Cymbalta and Flexeril. This regimen allows Melissa Walker to function and does not cause excessive sedation or other side effects.  "The benefits of continuing opioid therapy outweigh the risks and chronic opioids will be continued. Ongoing education about safe opioid treatment is provided  Interventions today include: Refill already available at the pharmacy we have contacted to make sure preauthorization has been processed.

## 2020-01-08 NOTE — Patient Instructions (Addendum)
It was nice seeing you today! Thank you for choosing [facility] for your Primary Care.    Today we talked about:   1. Pain medication has been refilled at the pharmacy today.   2. For your blood pressure, we changed your medication from Lisinopril to the combination of Lisinopril-HCTZ. Please continue to take 1 tablet daily and keep a log of your blood pressure at home.   3. Insomnia: We can do a short trial of Trazodone, in addition to working on sleep hygiene.     Let's follow up in 1 month in regards to your blood pressure and trouble sleeping.

## 2020-01-09 ENCOUNTER — Ambulatory Visit: Payer: Medicaid Other | Admitting: Physical Therapy

## 2020-01-09 NOTE — Progress Notes (Signed)
Internal Medicine Clinic Attending  Case discussed with Dr. Basaraba at the time of the visit.  We reviewed the resident's history and exam and pertinent patient test results.  I agree with the assessment, diagnosis, and plan of care documented in the resident's note.  Cambree Hendrix, M.D., Ph.D.  

## 2020-01-11 LAB — TOXASSURE SELECT,+ANTIDEPR,UR

## 2020-01-30 ENCOUNTER — Ambulatory Visit: Payer: Medicaid Other

## 2020-02-02 ENCOUNTER — Encounter: Payer: Self-pay | Admitting: Internal Medicine

## 2020-02-02 ENCOUNTER — Other Ambulatory Visit: Payer: Self-pay | Admitting: *Deleted

## 2020-02-02 DIAGNOSIS — Z79891 Long term (current) use of opiate analgesic: Secondary | ICD-10-CM

## 2020-02-02 MED ORDER — HYDROCODONE-ACETAMINOPHEN 5-325 MG PO TABS: 1.0000 | ORAL_TABLET | Freq: Three times a day (TID) | ORAL | 0 refills | Status: DC | PRN

## 2020-02-08 ENCOUNTER — Telehealth: Payer: Self-pay

## 2020-02-08 DIAGNOSIS — Z79891 Long term (current) use of opiate analgesic: Secondary | ICD-10-CM

## 2020-02-08 MED ORDER — HYDROCODONE-ACETAMINOPHEN 5-325 MG PO TABS: 1.0000 | ORAL_TABLET | Freq: Three times a day (TID) | ORAL | 0 refills | Status: DC | PRN

## 2020-02-08 NOTE — Telephone Encounter (Signed)
Pt is having trouble getting medicine due to physician, please contact pharmacy [facility]

## 2020-02-08 NOTE — Telephone Encounter (Signed)
Done

## 2020-02-08 NOTE — Telephone Encounter (Signed)
Dr Alfonse Spruce is not medicated certified as of yet, could the attending please resend the script for hydrocodone, the pharm has already voided the script he sent

## 2020-02-20 ENCOUNTER — Ambulatory Visit: Payer: Medicaid Other | Admitting: Physical Therapy

## 2020-02-28 ENCOUNTER — Other Ambulatory Visit: Payer: Self-pay | Admitting: *Deleted

## 2020-02-28 MED ORDER — TRAZODONE HCL 50 MG PO TABS: 25.0000 mg | ORAL_TABLET | Freq: Every day | ORAL | 0 refills | Status: DC

## 2020-02-29 ENCOUNTER — Other Ambulatory Visit: Payer: Medicaid Other

## 2020-02-29 DIAGNOSIS — Z20822 Contact with and (suspected) exposure to covid-19: Secondary | ICD-10-CM | POA: Diagnosis not present

## 2020-03-08 ENCOUNTER — Encounter: Payer: Self-pay | Admitting: Internal Medicine

## 2020-03-08 DIAGNOSIS — M5417 Radiculopathy, lumbosacral region: Secondary | ICD-10-CM

## 2020-03-08 DIAGNOSIS — Z79891 Long term (current) use of opiate analgesic: Secondary | ICD-10-CM

## 2020-03-11 MED ORDER — HYDROCODONE-ACETAMINOPHEN 5-325 MG PO TABS: 1.0000 | ORAL_TABLET | Freq: Three times a day (TID) | ORAL | 0 refills | Status: DC | PRN

## 2020-03-11 MED ORDER — CYCLOBENZAPRINE HCL 5 MG PO TABS: 5.0000 mg | ORAL_TABLET | Freq: Three times a day (TID) | ORAL | 0 refills | Status: DC | PRN

## 2020-03-21 MED ORDER — HYDROCODONE-ACETAMINOPHEN 5-325 MG PO TABS: 1.0000 | ORAL_TABLET | Freq: Three times a day (TID) | ORAL | 0 refills | Status: DC | PRN

## 2020-03-21 NOTE — Telephone Encounter (Signed)
Spoke with pharmacist this AM who stated she would close out Rx for hydrocodone from Dr. Alfonse Spruce as he is not certified with Medicaid. She would fill Rx for hydrocodone and flexeril sent by Dr. Konrad Penta on 03/11/2020. She would also get refill ready for duloxetine that was sent on 01/08/2020. Stated Rxs would be ready in 2-3 hours. Left detailed message on patient's self-identified VM with this information.   Patient calls from pharmacy and states they have no Rxs for her. Spoke with Marlowe Kays at Pharmacy. States she can get the duloxetine and flexeril ready but the Rx from Dr. Konrad Penta for hydrocodone was closed out. She is requesting Dr. Konrad Penta resend Rx for hydrocodone. Hubbard Hartshorn, BSN, RN-BC

## 2020-03-21 NOTE — Addendum Note (Signed)
Addended by: Velora Heckler on: 03/21/2020 03:25 PM   Modules accepted: Orders

## 2020-04-04 ENCOUNTER — Encounter: Payer: Self-pay | Admitting: Internal Medicine

## 2020-04-04 DIAGNOSIS — I10 Essential (primary) hypertension: Secondary | ICD-10-CM

## 2020-04-08 MED ORDER — ATENOLOL 25 MG PO TABS: ORAL_TABLET | ORAL | 1 refills | Status: DC

## 2020-04-08 MED ORDER — LISINOPRIL-HYDROCHLOROTHIAZIDE 20-12.5 MG PO TABS: 1.0000 | ORAL_TABLET | Freq: Every day | ORAL | 2 refills | Status: DC

## 2020-04-19 ENCOUNTER — Encounter: Payer: Self-pay | Admitting: Internal Medicine

## 2020-04-22 ENCOUNTER — Telehealth: Payer: Self-pay | Admitting: Internal Medicine

## 2020-04-22 NOTE — Telephone Encounter (Signed)
Please refer to message below.  Attempted to contact patient for an appointment, but no answer.  Left detailed message asking to call the clinic and schedule an appointment.

## 2020-04-22 NOTE — Telephone Encounter (Signed)
-----   Message from Mosetta Anis, MD sent at 04/19/2020 11:12 AM EST ----- Regarding: Need appointment Hello this patient is requesting refills on her pain meds but she is over-due for in-person appointment. She needs an appointment prior to refills. Thank you.

## 2020-04-24 ENCOUNTER — Encounter: Payer: Medicaid Other | Admitting: Internal Medicine

## 2020-04-30 ENCOUNTER — Ambulatory Visit: Payer: Medicaid Other | Admitting: Internal Medicine

## 2020-04-30 ENCOUNTER — Encounter: Payer: Self-pay | Admitting: Internal Medicine

## 2020-04-30 ENCOUNTER — Other Ambulatory Visit: Payer: Self-pay

## 2020-04-30 VITALS — BP 100/47 | HR 79 | Temp 97.9°F | Ht 63.0 in | Wt 117.0 lb

## 2020-04-30 DIAGNOSIS — I10 Essential (primary) hypertension: Secondary | ICD-10-CM | POA: Diagnosis not present

## 2020-04-30 DIAGNOSIS — Z79891 Long term (current) use of opiate analgesic: Secondary | ICD-10-CM

## 2020-04-30 DIAGNOSIS — R21 Rash and other nonspecific skin eruption: Secondary | ICD-10-CM | POA: Diagnosis not present

## 2020-04-30 DIAGNOSIS — M5417 Radiculopathy, lumbosacral region: Secondary | ICD-10-CM | POA: Diagnosis not present

## 2020-04-30 MED ORDER — LOSARTAN POTASSIUM 50 MG PO TABS: 50.0000 mg | ORAL_TABLET | Freq: Every day | ORAL | 1 refills | Status: DC

## 2020-04-30 MED ORDER — HYDROCODONE-ACETAMINOPHEN 5-325 MG PO TABS: 1.0000 | ORAL_TABLET | Freq: Three times a day (TID) | ORAL | 0 refills | Status: DC | PRN

## 2020-04-30 MED ORDER — CYCLOBENZAPRINE HCL 5 MG PO TABS: 5.0000 mg | ORAL_TABLET | Freq: Three times a day (TID) | ORAL | 2 refills | Status: DC | PRN

## 2020-04-30 NOTE — Patient Instructions (Signed)
Thank you for allowing [place] to provide your care today. Today we discussed your back pain, chest rash     I have ordered bmp labs for you. I will call if any are abnormal.    Today we made the following changes to your medications.    Please stop your lisinopril, hydrochlorothiazide, please start losartan 50mg   Please follow-up in 3 months.    Should you have any questions or concerns please call the internal medicine clinic at [phone].     Drug Rash A drug rash occurs when a medicine causes a change in the color or texture of the skin. It can develop minutes, hours, or days after you take the medicine. The rash may appear on a small area of skin or all over your body. What are the causes? This condition is usually caused by your body's reaction (allergy) to a medicine. It can also be caused by exposure to sunlight after taking a medicine that makes your skin sensitive to light. Though any medicine can cause a rash or reaction, medicines that are more likely to cause rashes include:  Penicillin.  Antibiotic medicines.  Medicines that treat seizures.  Medicines that treat cancer (chemotherapy).  Aspirin and other NSAIDs.  Injectable dyes that contain iodine.  Insulin. What are the signs or symptoms? Symptoms of this condition include:  Redness.  Tiny bumps.  Peeling.  Itching.  Itchy welts (hives).  Swelling.   How is this diagnosed? This condition may be diagnosed based on:  A physical exam.  Tests to find out which medicine caused the rash. These tests may include: ? Skin tests. ? Blood tests. ? Challenge test. For this test, you stop taking all the medicines that you do not need to take. Then, you start taking them again by adding back one medicine at a time. How is this treated? This condition is treated with medicines, including:  Antihistamine. This may be given to relieve itching.  NSAIDs. These may be given to reduce swelling and to treat  pain.  A steroid medicine. This may be given to reduce swelling. The rash usually goes away when you stop taking the medicine that caused it. Follow these instructions at home:  Take over-the-counter and prescription medicines only as told by your health care provider.  Tell all your health care providers about any medicine reactions that you have had in the past.  If your rash was caused by sensitivity to sunlight, and while your rash is healing: ? Avoid being in the sun if possible, especially when it is strongest, usually between 10 a.m. and 4 p.m. ? Cover your skin with pants, long sleeves, and a hat when you are exposed to sunlight.  If you have hives: ? Take a cool shower or use a cool compress to relieve itchiness. ? Take over-the-counter antihistamines, as recommended by your health care provider, until the hives are gone. Hives are not contagious.  Keep all follow-up visits as told by your health care provider. This is important. Contact a health care provider if you have:  A fever.  A rash that is not going away.  A rash that gets worse.  A rash that comes back.  Wheezing or coughing. Get help right away if:  You start to have breathing problems.  You start to have shortness of breath.  Your face or throat starts to swell.  You have severe weakness with dizziness or fainting.  You have chest pain. These symptoms may represent a serious  problem that is an emergency. Do not wait to see if the symptoms will go away. Get medical help right away. Call your local emergency services (911 in the [place] Do not drive yourself to the hospital. Summary  A drug rash occurs when a medicine causes a change in the color or texture of the skin. The rash may appear on a small area of skin or all over your body.  It can develop minutes, hours, or days after you take the medicine.  Your health care provider will do various tests to determine what medicine caused your  rash.  The rash may be treated with medicine to relieve itching, swelling, and pain. This information is not intended to replace advice given to you by your health care provider. Make sure you discuss any questions you have with your health care provider. Document Revised: 05/31/2019 Document Reviewed: 05/31/2019 Elsevier Patient Education  2021 [employer].

## 2020-04-30 NOTE — Assessment & Plan Note (Signed)
Melissa Walker is a 40 yo F w/ PMH of HTN, Lumbosacral radiculopathy with chronic opioid use presenting to [facility] with complaint of rash. She was in her usual state of health until last November when she developed pruritic rash on her chest that extends from her neck to her breasts. She mentions she had no other similar skin issues in the past and also denies family history of rheumatic diseases. She denies any changes in detergent, bedding, soap or clothes. She mentions using OTC hydrocortisone cream and allergy meds with relief of her symptoms but when she stops using it, her rash returns.  A/P Presenting with new onset maculopapular rash of her chest since November. Only notable inciting event is change in her anti-hypertensive regimen from lisinopril to lisinopril-hctz. Thiazide induced rashes have been rarely documented in the past. Other differentials include contact dermatitis, atopic dermatitis, or pityriasis rosea. OTC regimen have had some improvement in her symptoms. - Increase hydrocortisone cream use to twice daily - C/w anti-histamines - D/c lisinopril-hctz, start losartan 50mg  - F/u as needed

## 2020-04-30 NOTE — Assessment & Plan Note (Signed)
BP Readings from Last 3 Encounters:  04/30/20 (!) 100/47  01/08/20 (!) 145/88  08/30/19 125/62   BP at goal. Currently on lisinopril-hctz 20-12.5mg  daily. Previously on lisinopril only with above goal bp. Currently denies any light-headedness, dizziness.   A/P New complaint of rash possible due to drug reaction with new anti-hypertensive, hctz. Previously tolerated lisinopril in the past but had poor control of bp. Will switch to losartan. - D/c lisinopril-hctz - Start losartan 50mg  daily - BMP next visit

## 2020-04-30 NOTE — Progress Notes (Signed)
CC: Rash  HPI: Melissa Walker is a 40 y.o. with PMH listed below presenting with complaint of rash. Please see problem based assessment and plan for further details.  Past Medical History:  Diagnosis Date  . Abnormal uterine bleeding 02/04/2012   Occurred 01/12/12-01/15/12. Evaluated in [facility]U and all labs, exam, and pelvic u/s WNL. Given rx for ibuprofen and told to follow up as otupatient.    . Anxiety    Pt states she's on cymbalta "to calm me down"  . Cholelithiasis 10/26/2014  . Chronic back pain   . GERD (gastroesophageal reflux disease)    possible PUD; getting workup currently  . Hypertension   . Inappropriate sinus tachycardia 04/21/2017  . Night sweats 03/30/2016  . Pelvic mass 07/13/2018   6.1 x 2.3 cm posterior pelvic cystic mass noted on CT 05/2018.  Marland Kitchen PUD (peptic ulcer disease) 07/13/2018  . Tobacco abuse   . Underweight 10/26/2014   Review of Systems: Review of Systems  Constitutional: Negative for chills, fever and malaise/fatigue.  Eyes: Negative for blurred vision.  Respiratory: Negative for shortness of breath.   Cardiovascular: Negative for chest pain, palpitations and leg swelling.  Gastrointestinal: Negative for constipation, diarrhea, nausea and vomiting.  Musculoskeletal: Positive for back pain and joint pain.  Skin: Positive for itching and rash.  Neurological: Negative for dizziness.  All other systems reviewed and are negative.    Physical Exam: Vitals:   04/30/20 0947  BP: (!) 100/47  Pulse: 79  Temp: 97.9 F (36.6 C)  TempSrc: Oral  SpO2: 98%  Weight: 117 lb (53.1 kg)  Height: 5\' 3"  (1.6 m)   Gen: Well-developed, well nourished, NAD HEENT: NCAT head, hearing intact CV: RRR, S1, S2 normal Pulm: CTAB, No rales, no wheezes Extm: ROM intact, Peripheral pulses intact, No peripheral edema Skin: Symmetrical maculopapular rash on anterior chest extending from lower neck to breast (unable to take picture due to [facility] error)  Assessment &  Plan:   Hypertension BP Readings from Last 3 Encounters:  04/30/20 (!) 100/47  01/08/20 (!) 145/88  08/30/19 125/62   BP at goal. Currently on lisinopril-hctz 20-12.5mg  daily. Previously on lisinopril only with above goal bp. Currently denies any light-headedness, dizziness.   A/P New complaint of rash possible due to drug reaction with new anti-hypertensive, hctz. Previously tolerated lisinopril in the past but had poor control of bp. Will switch to losartan. 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 As part of the treatment plan, the [place] controlled substance database is checked at least twice yearly and the database results was checked 04/30/20 and is appropriate.   The last UDS was on 01/2020 and results did not show presence of prescribed medicine. But extensive documentation shows patient was unable to pick-up her refills promptly due to complications with prior authorization. Patient needs a yearly UDS.  Pain contract was updated and signed on 04/30/20. Through the pain contract, AMARISA WILINSKI describes commitment to avoid excessive opioid use and only relying on pain medications to pursue goal of function independently and continue to achieve getting out of bed without debilitating pain.  The patient is on hydrocodone-acetaminophen 5-325mg , 90 per 30 days. Adjunctive treatment includes flexeril. This regimen allows BRIONA KORPELA to function without excessive sedation or side effects. The benefits of continuing opioid therapy outweigh the risks and chronic opioids  will be continued. Ongoing education about safe opioid treatment is provided.    - C/w hydrocone-acetaminophen 5-325mg  q8hr prn - Pain contract updated and signed - F/u in 3 months   Lumbosacral radiculopathy Pt requires refills on medications with  associated diagnosis above.  Reviewed disease process and find this medication to be necessary, will not change dose or alter current therapy.   Rash of body Melissa Walker is a 40 yo F w/ PMH of HTN, Lumbosacral radiculopathy with chronic opioid use presenting to [facility] with complaint of rash. She was in her usual state of health until last November when she developed pruritic rash on her chest that extends from her neck to her breasts. She mentions she had no other similar skin issues in the past and also denies family history of rheumatic diseases. She denies any changes in detergent, bedding, soap or clothes. She mentions using OTC hydrocortisone cream and allergy meds with relief of her symptoms but when she stops using it, her rash returns.  A/P Presenting with new onset maculopapular rash of her chest since November. Only notable inciting event is change in her anti-hypertensive regimen from lisinopril to lisinopril-hctz. Thiazide induced rashes have been rarely documented in the past. Other differentials include contact dermatitis, atopic dermatitis, or pityriasis rosea. OTC regimen have had some improvement in her symptoms. - Increase hydrocortisone cream use to twice daily - C/w anti-histamines - D/c lisinopril-hctz, start losartan 50mg  - F/u as needed    Patient discussed with Dr. Jimmye Norman  -Gilberto Better, [facility] Pager: [phone]

## 2020-04-30 NOTE — Assessment & Plan Note (Signed)
Pt requires refills on medications with associated diagnosis above.  Reviewed disease process and find this medication to be necessary, will not change dose or alter current therapy. 

## 2020-04-30 NOTE — Assessment & Plan Note (Signed)
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 As part of the treatment plan, the [place] controlled substance database is checked at least twice yearly and the database results was checked 04/30/20 and is appropriate.   The last UDS was on 01/2020 and results did not show presence of prescribed medicine. But extensive documentation shows patient was unable to pick-up her refills promptly due to complications with prior authorization. Patient needs a yearly UDS.  Pain contract was updated and signed on 04/30/20. Through the pain contract, Melissa Walker describes commitment to avoid excessive opioid use and only relying on pain medications to pursue goal of function independently and continue to achieve getting out of bed without debilitating pain.  The patient is on hydrocodone-acetaminophen 5-325mg , 90 per 30 days. Adjunctive treatment includes flexeril. This regimen allows Melissa Walker to function without excessive sedation or side effects. The benefits of continuing opioid therapy outweigh the risks and chronic opioids will be continued. Ongoing education about safe opioid treatment is provided.    - C/w hydrocone-acetaminophen 5-325mg  q8hr prn - Pain contract updated and signed - F/u in 3 months

## 2020-05-01 NOTE — Progress Notes (Signed)
Internal Medicine Clinic Attending  Case discussed with Dr. Lee  At the time of the visit.  We reviewed the resident's history and exam and pertinent patient test results.  I agree with the assessment, diagnosis, and plan of care documented in the resident's note.    

## 2020-05-05 ENCOUNTER — Encounter: Payer: Self-pay | Admitting: Internal Medicine

## 2020-05-06 ENCOUNTER — Other Ambulatory Visit: Payer: Self-pay | Admitting: Student

## 2020-05-06 DIAGNOSIS — R21 Rash and other nonspecific skin eruption: Secondary | ICD-10-CM

## 2020-05-06 MED ORDER — TRIAMCINOLONE ACETONIDE 0.5 % EX CREA: 1.0000 "application " | TOPICAL_CREAM | Freq: Two times a day (BID) | CUTANEOUS | 0 refills | Status: DC

## 2020-05-28 ENCOUNTER — Encounter: Payer: Self-pay | Admitting: Internal Medicine

## 2020-05-28 ENCOUNTER — Other Ambulatory Visit: Payer: Self-pay | Admitting: Student

## 2020-05-28 DIAGNOSIS — Z79891 Long term (current) use of opiate analgesic: Secondary | ICD-10-CM

## 2020-05-28 IMAGING — US US ABDOMEN LIMITED
1 series · 14 of 25 positions shown · non-contrast
Comparison: CT abdomen pelvis 05/03/2018

CLINICAL DATA: Abdominal pain and vomiting for 3 days

EXAM:
ULTRASOUND ABDOMEN LIMITED RIGHT UPPER QUADRANT

[Series 1: us abdomen limited · 0.17mm/px · 14 of 54 slices shown]
[im 1/54]
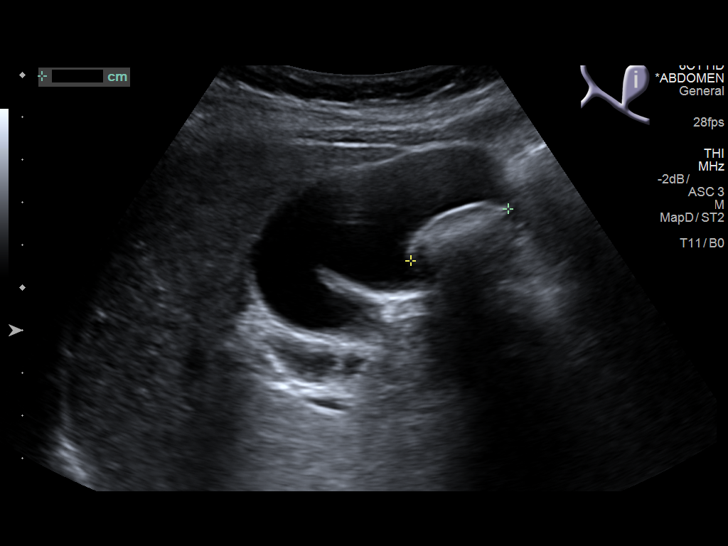
[im 5/54]
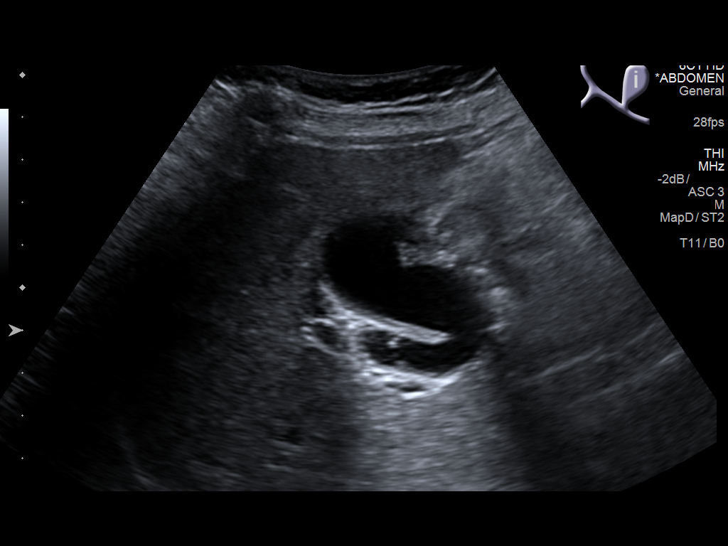
[im 9/54]
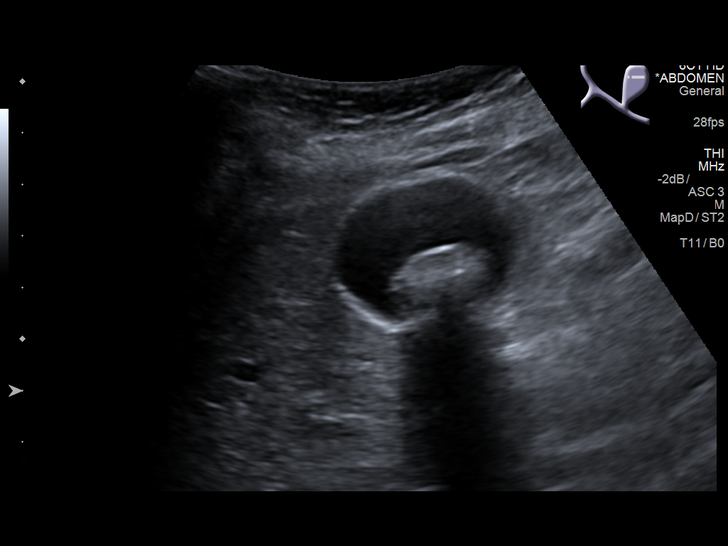
[im 14/54]
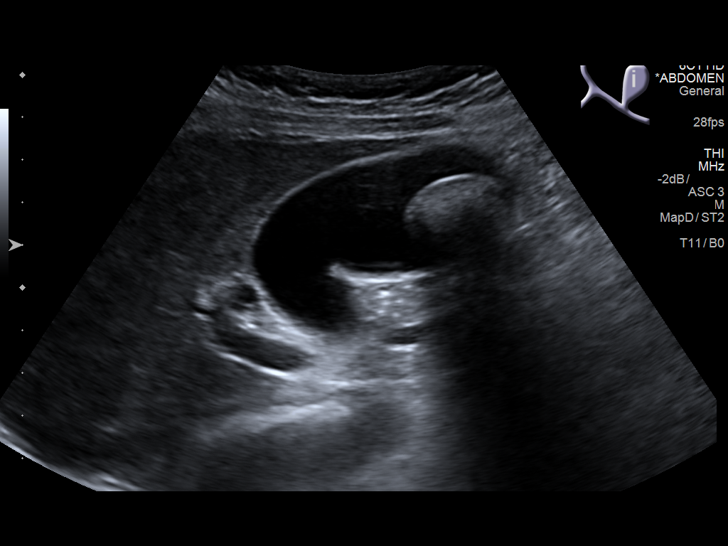
[im 18/54]
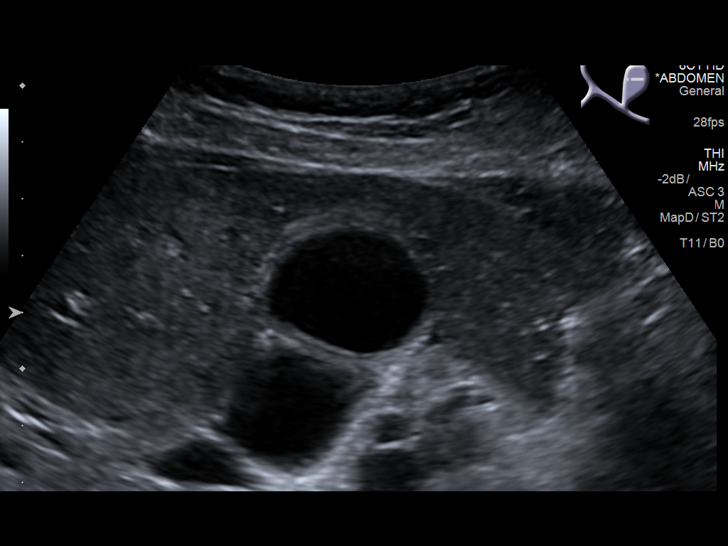
[im 20/54]
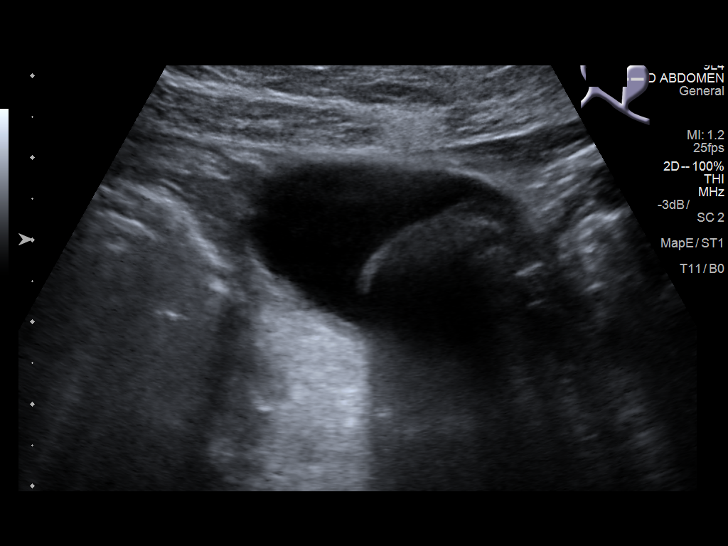
[im 25/54]
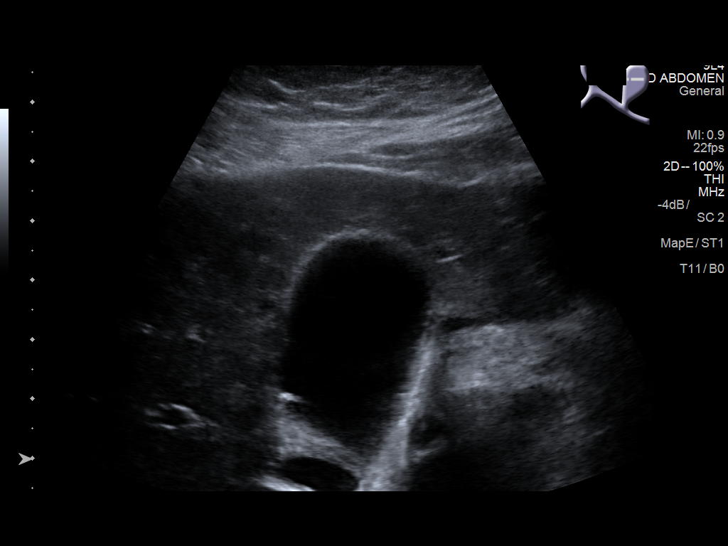
[im 29/54]
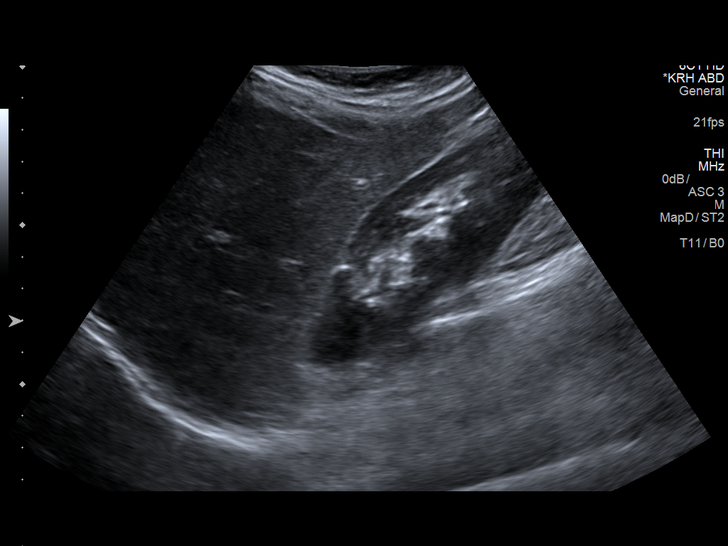
[im 34/54]
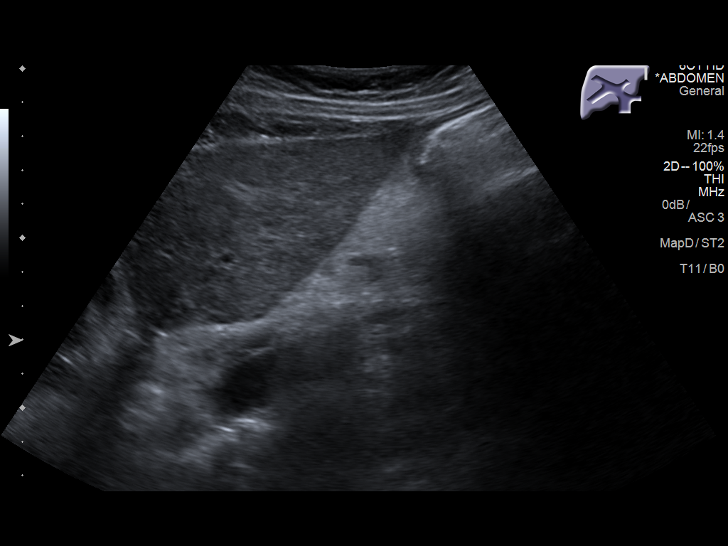
[im 36/54]
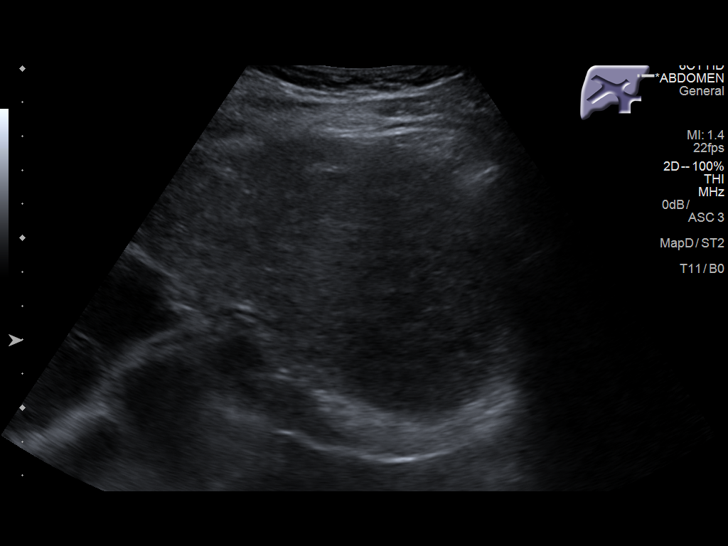
[im 40/54]
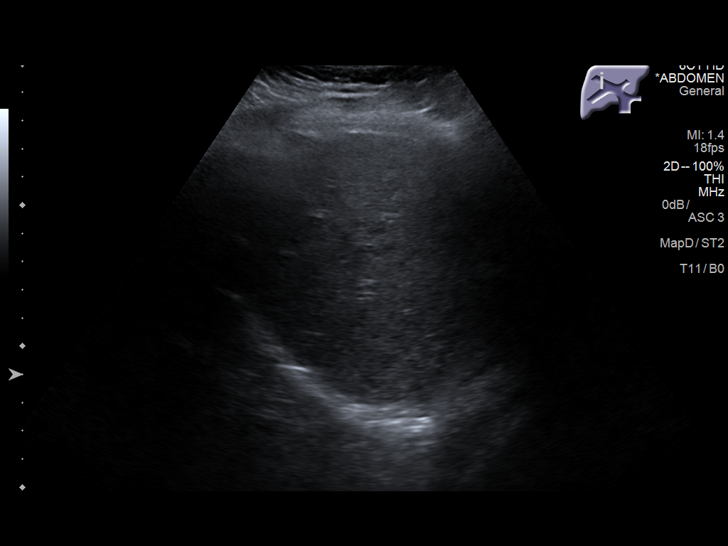
[im 45/54]
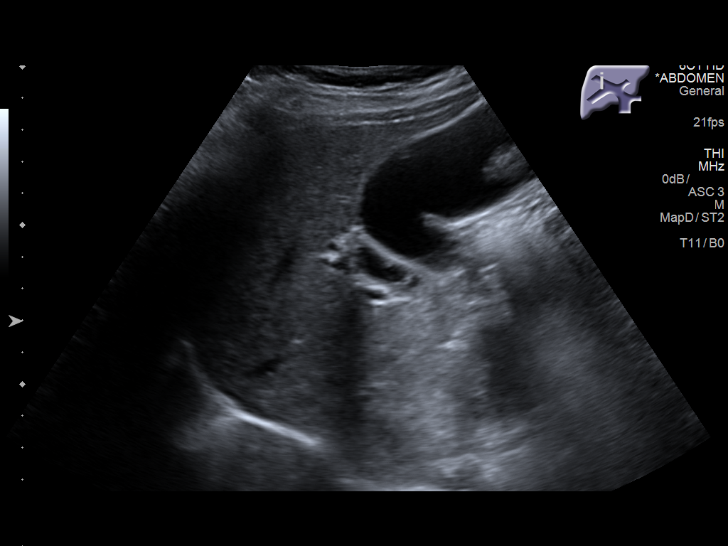
[im 49/54]
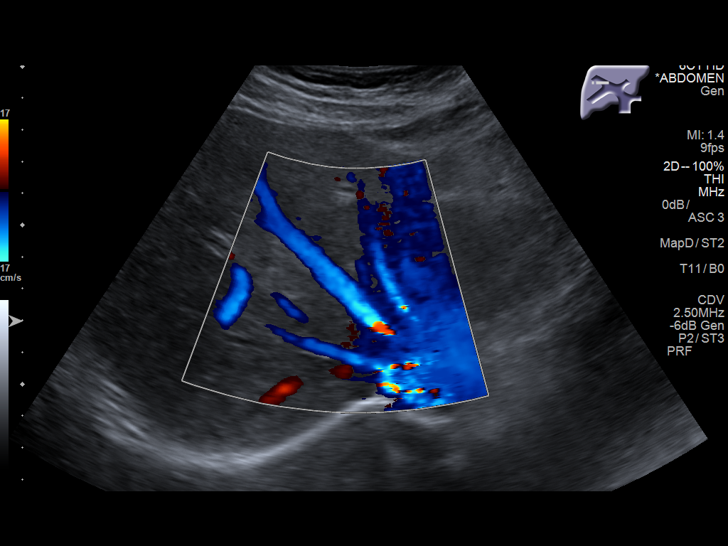
[im 54/54]
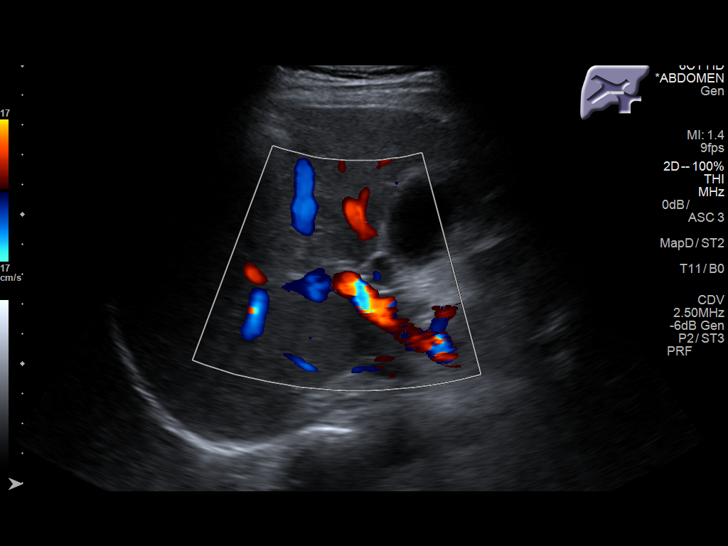

[14 of 25 positions shown; findings below may reference images not displayed]

FINDINGS: Gallbladder:

Large gallstone measures up to 2.6 cm. A negative sonographic Murphy
sign was reported by the sonographer. Trace pericholecystic fluid.
No gallbladder wall thickening.

Common bile duct:

Diameter: 6 mm

Liver:

Mildly increased hepatic echogenicity. Portal vein is patent on
color Doppler imaging with normal direction of blood flow towards
the liver.
IMPRESSION: Cholelithiasis with no other evidence of acute cholecystitis.

## 2020-05-28 MED ORDER — HYDROCODONE-ACETAMINOPHEN 5-325 MG PO TABS
1.0000 | ORAL_TABLET | Freq: Three times a day (TID) | ORAL | 0 refills | Status: DC | PRN
Start: 2020-05-30 — End: 2020-05-28

## 2020-05-28 MED ORDER — HYDROCODONE-ACETAMINOPHEN 5-325 MG PO TABS: 1.0000 | ORAL_TABLET | Freq: Three times a day (TID) | ORAL | 0 refills | Status: DC | PRN

## 2020-05-28 NOTE — Progress Notes (Addendum)
Refilled patient's chronic hydrocodone per request, 3 months sent to pharmacy. Reviewed PDMP and prior notes.  She will need a Utox at follow up.

## 2020-05-28 NOTE — Addendum Note (Signed)
Addended by: Jodean Lima on: 05/28/2020 02:43 PM   Modules accepted: Orders

## 2020-05-29 DIAGNOSIS — M79661 Pain in right lower leg: Secondary | ICD-10-CM | POA: Diagnosis not present

## 2020-05-29 DIAGNOSIS — M25571 Pain in right ankle and joints of right foot: Secondary | ICD-10-CM | POA: Diagnosis not present

## 2020-05-29 DIAGNOSIS — M7989 Other specified soft tissue disorders: Secondary | ICD-10-CM | POA: Diagnosis not present

## 2020-05-29 DIAGNOSIS — M25561 Pain in right knee: Secondary | ICD-10-CM | POA: Diagnosis not present

## 2020-06-04 DIAGNOSIS — M25561 Pain in right knee: Secondary | ICD-10-CM | POA: Diagnosis not present

## 2020-06-14 ENCOUNTER — Encounter: Payer: Self-pay | Admitting: Internal Medicine

## 2020-06-17 MED ORDER — TRAZODONE HCL 50 MG PO TABS: 25.0000 mg | ORAL_TABLET | Freq: Every day | ORAL | 0 refills | Status: DC

## 2020-06-17 NOTE — Telephone Encounter (Signed)
Trazodone refilled. Called patient and medication is helping with insomnia. In addition she denied any side effects since starting medication.

## 2020-07-08 ENCOUNTER — Other Ambulatory Visit: Payer: Self-pay

## 2020-07-08 ENCOUNTER — Ambulatory Visit: Payer: Medicaid Other | Admitting: Internal Medicine

## 2020-07-08 ENCOUNTER — Encounter: Payer: Self-pay | Admitting: Internal Medicine

## 2020-07-08 VITALS — BP 139/85 | HR 81 | Temp 98.5°F | Ht 63.0 in | Wt 118.6 lb

## 2020-07-08 DIAGNOSIS — Z79891 Long term (current) use of opiate analgesic: Secondary | ICD-10-CM | POA: Diagnosis not present

## 2020-07-08 DIAGNOSIS — G4700 Insomnia, unspecified: Secondary | ICD-10-CM

## 2020-07-08 DIAGNOSIS — M5417 Radiculopathy, lumbosacral region: Secondary | ICD-10-CM | POA: Diagnosis not present

## 2020-07-08 DIAGNOSIS — I10 Essential (primary) hypertension: Secondary | ICD-10-CM | POA: Diagnosis not present

## 2020-07-08 DIAGNOSIS — D539 Nutritional anemia, unspecified: Secondary | ICD-10-CM | POA: Diagnosis not present

## 2020-07-08 MED ORDER — HYDROCODONE-ACETAMINOPHEN 5-325 MG PO TABS: 1.0000 | ORAL_TABLET | Freq: Three times a day (TID) | ORAL | 0 refills | Status: DC | PRN

## 2020-07-08 MED ORDER — DULOXETINE HCL 60 MG PO CPEP: ORAL_CAPSULE | ORAL | 1 refills | Status: DC

## 2020-07-08 MED ORDER — TRAZODONE HCL 50 MG PO TABS: 50.0000 mg | ORAL_TABLET | Freq: Every day | ORAL | 1 refills | Status: DC

## 2020-07-08 NOTE — Progress Notes (Signed)
   CC: HTN, chronic pain, insomnia   HPI:  [name] is a 40 y.o. with a PMHx as listed below who presents to the clinic for HTN, chronic pain, insomnia.   Please see the Encounters tab for problem-based Assessment & Plan regarding status of patient's acute and chronic conditions.  Past Medical History:  Diagnosis Date  . Abnormal uterine bleeding 02/04/2012   Occurred 01/12/12-01/15/12. Evaluated in MAU and all labs, exam, and pelvic u/s WNL. Given rx for ibuprofen and told to follow up as otupatient.    . Anxiety    Pt states she's on cymbalta "to calm me down"  . Cholelithiasis 10/26/2014  . Chronic back pain   . GERD (gastroesophageal reflux disease)    possible PUD; getting workup currently  . Hypertension   . Inappropriate sinus tachycardia 04/21/2017  . Night sweats 03/30/2016  . Pelvic mass 07/13/2018   6.1 x 2.3 cm posterior pelvic cystic mass noted on CT 05/2018.  Marland Kitchen PUD (peptic ulcer disease) 07/13/2018  . Tobacco abuse   . Underweight 10/26/2014   Review of Systems: Review of Systems  Constitutional: Negative for chills, fever and weight loss.  Respiratory: Negative for shortness of breath and wheezing.   Cardiovascular: Negative for chest pain and palpitations.  Gastrointestinal: Negative for abdominal pain, diarrhea, nausea and vomiting.  Musculoskeletal: Positive for back pain and joint pain. Negative for falls.  Neurological: Negative for dizziness and headaches.  Psychiatric/Behavioral: The patient has insomnia.    Physical Exam:  Vitals:   07/08/20 0936  BP: 139/85  Pulse: 81  Temp: 98.5 F (36.9 C)  TempSrc: Oral  SpO2: 100%  Weight: 118 lb 9.6 oz (53.8 kg)  Height: 5\' 3"  (1.6 m)   Physical Exam Vitals and nursing note reviewed.  Constitutional:      General: She is not in acute distress.    Appearance: She is normal weight.  Cardiovascular:     Rate and Rhythm: Normal rate and regular rhythm.     Heart sounds: No murmur  heard.   Pulmonary:     Effort: Pulmonary effort is normal. No respiratory distress.  Musculoskeletal:     Right lower leg: No edema.     Left lower leg: No edema.  Neurological:     General: No focal deficit present.     Mental Status: She is alert and oriented to person, place, and time. Mental status is at baseline.     Gait: Gait normal.  Psychiatric:        Mood and Affect: Mood normal.        Behavior: Behavior normal.        Thought Content: Thought content normal.        Judgment: Judgment normal.    Assessment & Plan:   See Encounters Tab for problem based charting.  Patient discussed with Dr. Jimmye Norman

## 2020-07-08 NOTE — Assessment & Plan Note (Signed)
She states that she continues to have low back pain that is exacerbated by recent weather changes.  She feels that the Norco and Flexeril relatively controlled her symptoms well.  Assessment/plan: - Refilled Flexeril - Refilled Norco, please see separate tab

## 2020-07-08 NOTE — Patient Instructions (Signed)
It was nice seeing you today! Thank you for choosing [facility] for your Primary Care.    Today we talked about:   1. I have refilled your pain medication, Norco. We discussed increasing Tylenol to every 6 hours. You can take up to 4000 mg per day.   2. I refilled your Trazodone at the increased dose of 50 mg. Take 1 tablet before bedtime.  3. I am rechecking your blood counts due to your anemia last checked in 2020. I will call you with the results.

## 2020-07-08 NOTE — Assessment & Plan Note (Signed)
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 As part of the treatment plan, the [place] controlled substance database is checked at least twice yearly and the database results was checked 07/08/2020 and is appropriate.   The last UDS was on 01/2020 and results did not show presence of prescribed medicine. But extensive documentation shows patient was unable to pick-up her refills promptly due to complications with prior authorization. Patient needs a yearly UDS.   Pain contract was updated and signed on 04/30/20. Through the pain contract, Melissa Walker describes commitment to avoid excessive opioid use and only relying on pain medications to pursue goal of function independently and continue to achieve getting out of bed without debilitating pain.  The patient is on hydrocodone-acetaminophen 5-325mg , 90 per 30 days. Adjunctive treatment includes flexeril. This regimen allows Melissa Walker to function without excessive sedation or side effects. The benefits of continuing opioid therapy outweigh the risks and chronic opioids will be continued. Ongoing education about safe opioid treatment is provided.    - C/w hydrocone-acetaminophen 5-325mg  q8hr prn - Pain contract updated and signed - F/u in 3 months

## 2020-07-08 NOTE — Assessment & Plan Note (Signed)
Melissa Walker states that given recent stressors in her home, she had increased her trazodone from 25 to 50 mg at bedtime.  This was controlling her insomnia well, however she ran out of her pills early.  Requesting a refill at this time.  Assessment/plan: - We will refill trazodone at decreased dose of 50 mg prior to bedtime

## 2020-07-08 NOTE — Assessment & Plan Note (Signed)
BP: 139/85   Melissa Walker states that she is doing well on her current medications.  She was switched to losartan at her last visit in March and has been tolerating this without difficulty.  She feels like her blood pressure is slightly above goal today due to lack of sleep for the last 2 days and stressors within her home.  Assessment/plan: Blood pressure is slightly above goal today, however we will hold off on making any adjustments until acute stressors are resolved.  Long-term plan would be to increase losartan and eventually discontinue atenolol.  This was discussed with the patient.

## 2020-07-08 NOTE — Assessment & Plan Note (Signed)
Patient has a history of anemia that was previously evaluated by GI.  Last CBC was 2 years ago.  We will recheck today.

## 2020-07-09 ENCOUNTER — Telehealth: Payer: Self-pay | Admitting: *Deleted

## 2020-07-09 LAB — CBC
Hematocrit: 39 % (ref 34.0–46.6)
Hemoglobin: 13.2 g/dL (ref 11.1–15.9)
MCH: 35.7 pg — ABNORMAL HIGH (ref 26.6–33.0)
MCHC: 33.8 g/dL (ref 31.5–35.7)
MCV: 105 fL — ABNORMAL HIGH (ref 79–97)
Platelets: 448 10*3/uL (ref 150–450)
RBC: 3.7 x10E6/uL — ABNORMAL LOW (ref 3.77–5.28)
RDW: 12.9 % (ref 11.7–15.4)
WBC: 9.8 10*3/uL (ref 3.4–10.8)

## 2020-07-09 NOTE — Progress Notes (Signed)
Anemia has resolved although patient has persistent macrocytosis. Will continue to monitor.

## 2020-07-09 NOTE — Telephone Encounter (Addendum)
Information was called to [facility] for PA for Hydrocodone-Acetaminphen 5-325.  Awaiting determination within 24 hours.  Sander Nephew, RN 07/09/2020 8:54 AM. Call to [facility] Hydrocodone Acetaminophen 5-325  was approved 07/09/2020 thru 01/09/2021.  Sander Nephew, RN 07/30/2020 11:58 AM.

## 2020-07-10 NOTE — Progress Notes (Signed)
Internal Medicine Clinic Attending  Case discussed with Dr. Charleen Kirks  At the time of the visit.  We reviewed the resident's history and exam and pertinent patient test results.  I agree with the assessment, diagnosis, and plan of care documented in the resident's note. Melissa Walker will need further evaluation for her macrocytosis; former notes suggest there may have been a planned anemia w/u with prior GI, though no record of labs in system.

## 2020-07-19 DIAGNOSIS — M25561 Pain in right knee: Secondary | ICD-10-CM | POA: Diagnosis not present

## 2020-07-22 ENCOUNTER — Encounter: Payer: Self-pay | Admitting: Family Medicine

## 2020-07-22 ENCOUNTER — Other Ambulatory Visit: Payer: Self-pay

## 2020-07-22 ENCOUNTER — Other Ambulatory Visit (HOSPITAL_COMMUNITY)
Admission: RE | Admit: 2020-07-22 | Discharge: 2020-07-22 | Disposition: A | Discharge status: Home / Self Care | Payer: Medicaid Other | Source: Ambulatory Visit | Attending: Family Medicine | Admitting: Family Medicine

## 2020-07-22 ENCOUNTER — Ambulatory Visit (INDEPENDENT_AMBULATORY_CARE_PROVIDER_SITE_OTHER): Payer: Medicaid Other | Admitting: Family Medicine

## 2020-07-22 VITALS — BP 125/82 | HR 97 | Wt 115.3 lb

## 2020-07-22 DIAGNOSIS — N926 Irregular menstruation, unspecified: Secondary | ICD-10-CM | POA: Diagnosis not present

## 2020-07-22 DIAGNOSIS — Z01419 Encounter for gynecological examination (general) (routine) without abnormal findings: Secondary | ICD-10-CM

## 2020-07-22 DIAGNOSIS — Z124 Encounter for screening for malignant neoplasm of cervix: Secondary | ICD-10-CM

## 2020-07-22 MED ORDER — NORETHINDRONE 0.35 MG PO TABS: 1.0000 | ORAL_TABLET | Freq: Every day | ORAL | 11 refills | Status: DC

## 2020-07-22 NOTE — Progress Notes (Signed)
   GYNECOLOGY PROBLEM  VISIT ENCOUNTER NOTE  Subjective:   Melissa Walker is a 40 y.o. [id number] female here for a a problem GYN visit.  Current complaints:   Missed period in Jan/Feb. March has spotting and bled the whole month of April. Reports prior to jan she had monthly cycle lasting 7d that was not not particularly heavy or painful.   Has BTL for pregnancy prevention.   Denies abnormal vaginal bleeding, discharge, pelvic pain, problems with intercourse or other gynecologic concerns.    Gynecologic History Patient's last menstrual period was 07/08/2020 (approximate). Contraception: tubal ligation  Health Maintenance Due  Topic Date Due  . Hepatitis C Screening  Never done  . PAP SMEAR-Modifier  12/22/2017  . COVID-19 Vaccine (2 - Booster for Janssen series) 08/29/2019   The following portions of the patient's history were reviewed and updated as appropriate: allergies, current medications, past family history, past medical history, past social history, past surgical history and problem list.  Review of Systems Pertinent items are noted in HPI.   Objective:  BP 125/82   Pulse 97   Wt 115 lb 4.8 oz (52.3 kg)   LMP 07/08/2020 (Approximate)   BMI 20.42 kg/m  Gen: well appearing, NAD HEENT: no scleral icterus CV: RR Lung: Normal WOB Ext: warm well perfused  PELVIC: Normal appearing external genitalia; normal appearing vaginal mucosa and cervix.  No abnormal discharge noted.  Pap smear obtained.  Normal uterine size, no other palpable masses, no uterine or adnexal tenderness.   Assessment and Plan:  1. Well woman exam with routine gynecological exam Discussed Hep C screening and she will get with PCP Reviewed overall wellness measures Pap today  Health Maintenance Due  Topic Date Due  . Hepatitis C Screening  Never done  . PAP SMEAR-Modifier  12/22/2017  . COVID-19 Vaccine (2 - Booster for Janssen series) 08/29/2019   2. Irregular menses - reviewed this is  normal within 5-67yr prior to menopause. - Would like bleeding control to avoid month long cycle - Discussed IUD vs POP - Opts for POP, sent rx to pharmacy  3. Cervical cancer screening Pap today   Please refer to After Visit Summary for other counseling recommendations.   Return in about 1 year (around 07/22/2021) for Yearly wellness exam.  Caren Macadam, MD, MPH, ABFM Attending [place] for [facility]

## 2020-07-23 LAB — CYTOLOGY - PAP
Comment: NEGATIVE
Diagnosis: NEGATIVE
Diagnosis: REACTIVE
High risk HPV: NEGATIVE

## 2020-07-24 DIAGNOSIS — M222X1 Patellofemoral disorders, right knee: Secondary | ICD-10-CM | POA: Diagnosis not present

## 2020-07-29 ENCOUNTER — Emergency Department (HOSPITAL_BASED_OUTPATIENT_CLINIC_OR_DEPARTMENT_OTHER)
Admission: EM | Admit: 2020-07-29 | Discharge: 2020-07-29 | Disposition: A | Discharge status: Home / Self Care | Payer: Medicaid Other | Attending: Emergency Medicine | Admitting: Emergency Medicine

## 2020-07-29 ENCOUNTER — Encounter (HOSPITAL_BASED_OUTPATIENT_CLINIC_OR_DEPARTMENT_OTHER): Payer: Self-pay

## 2020-07-29 ENCOUNTER — Other Ambulatory Visit: Payer: Self-pay

## 2020-07-29 DIAGNOSIS — F17210 Nicotine dependence, cigarettes, uncomplicated: Secondary | ICD-10-CM | POA: Diagnosis not present

## 2020-07-29 DIAGNOSIS — Z79899 Other long term (current) drug therapy: Secondary | ICD-10-CM | POA: Insufficient documentation

## 2020-07-29 DIAGNOSIS — I10 Essential (primary) hypertension: Secondary | ICD-10-CM | POA: Insufficient documentation

## 2020-07-29 DIAGNOSIS — L02419 Cutaneous abscess of limb, unspecified: Secondary | ICD-10-CM

## 2020-07-29 DIAGNOSIS — L02411 Cutaneous abscess of right axilla: Secondary | ICD-10-CM | POA: Insufficient documentation

## 2020-07-29 MED ORDER — LIDOCAINE-EPINEPHRINE 2 %-1:100000 IJ SOLN: 20.0000 mL | Freq: Once | INTRAMUSCULAR | Status: DC

## 2020-07-29 MED ORDER — LIDOCAINE-EPINEPHRINE (PF) 2 %-1:200000 IJ SOLN
INTRAMUSCULAR | Status: AC
  Administered 2020-07-29: 20 mL
  Filled 2020-07-29: qty 20

## 2020-07-29 NOTE — ED Provider Notes (Signed)
[place] EMERGENCY DEPT Provider Note   CSN: [id number] Arrival date & time: 07/29/20  4431     History Chief Complaint  Patient presents with  . Abscess    Melissa Walker is a 40 y.o. female.  The history is provided by the patient.  Abscess  Melissa Walker is a 40 y.o. female who presents to the Emergency Department complaining of abscess. She presents the emergency department complaining of abscess to her right axillary region. This started abruptly last Wednesday. She went to urgent care that day and was started on Bactrim one tablet BID. She presents the emergency department today due to ongoing pain and lack of improvement to the area. She states that is not significantly worsening but has not improved since starting the antibiotic. She has five more days of antibiotic remaining. No associated fevers, chills, chest pain, difficulty breathing, recent illnesses. She has a history of hypertension, no additional medical problems. No history of prior abscess or soft tissue skin infection.    Past Medical History:  Diagnosis Date  . Abnormal uterine bleeding 02/04/2012   Occurred 01/12/12-01/15/12. Evaluated in [facility] and all labs, exam, and pelvic u/s WNL. Given rx for ibuprofen and told to follow up as otupatient.    . Anxiety    Pt states she's on cymbalta "to calm me down"  . Cholelithiasis 10/26/2014  . Chronic back pain   . GERD (gastroesophageal reflux disease)    possible PUD; getting workup currently  . Hypertension   . Inappropriate sinus tachycardia 04/21/2017  . Night sweats 03/30/2016  . Pelvic mass 07/13/2018   6.1 x 2.3 cm posterior pelvic cystic mass noted on CT 05/2018.  Marland Kitchen PUD (peptic ulcer disease) 07/13/2018  . Tobacco abuse   . Underweight 10/26/2014    Patient Active Problem List   Diagnosis Date Noted  . Rash of body 04/30/2020  . Insomnia 01/08/2020  . Macrocytic anemia 01/05/2019  . History of ovarian cyst 07/13/2018  . PUD (peptic  ulcer disease) 07/13/2018  . Hypertension 08/10/2016  . Thyroid function study abnormality 03/30/2016  . Long-term current use of opiate analgesic 02/27/2016  . Adult acne 12/04/2014  . Anxiety and depression 10/26/2014  . Healthcare maintenance 10/26/2014  . Lumbosacral radiculopathy 04/13/2008  . TOBACCO ABUSE 02/02/2006    Past Surgical History:  Procedure Laterality Date  . DENTAL REHABILITATION     full set dentures  . LUMBAR LAMINECTOMY/DECOMPRESSION MICRODISCECTOMY Right 07/23/2014   Procedure: Right Lumbar five-Sacral one laminotomy and microdiskectomy;  Surgeon: Jovita Gamma, MD;  Location: [facility];  Service: Neurosurgery;  Laterality: Right;  Right Lumbar five-Sacral one laminotomy and microdiskectomy  . Multiple epidural steroid injections in back     Over past 2 years  . POSTERIOR LAMINECTOMY / DECOMPRESSION LUMBAR SPINE  06/23/2018   L5-S1 decompression   . TUBAL LIGATION Bilateral 05/11/2013   Procedure: POST PARTUM TUBAL LIGATION;  Surgeon: Osborne Oman, MD;  Location: [facility];  Service: Gynecology;  Laterality: Bilateral;     OB History    Gravida  2   Para  2   Term  2   Preterm      AB      Living  2     SAB      IAB      Ectopic      Multiple      Live Births  2           Family History  Problem  Relation Age of Onset  . Cancer Father        brain tumor   . Eclampsia Maternal Grandmother   . Heart attack Maternal Grandmother   . Cancer Maternal Grandmother        lung  . Heart disease Maternal Grandmother   . Hyperlipidemia Maternal Grandmother   . Hypertension Maternal Grandmother     Social History   Tobacco Use  . Smoking status: Current Every Day Smoker    Packs/day: 0.50    Years: 18.00    Pack years: 9.00    Types: Cigarettes  . Smokeless tobacco: Never Used  Vaping Use  . Vaping Use: Never used  Substance Use Topics  . Alcohol use: Yes    Alcohol/week: 0.0 standard drinks    Comment: occasional  . Drug  use: No    Home Medications Prior to Admission medications   Medication Sig Start Date End Date Taking? Authorizing Provider  atenolol (TENORMIN) 25 MG tablet TAKE 1/2 TABLET(12.5 MG) BY MOUTH DAILY 04/08/20   Jose Persia, MD  cyclobenzaprine (FLEXERIL) 5 MG tablet Take 1 tablet (5 mg total) by mouth 3 (three) times daily as needed for muscle spasms. 04/30/20   Mosetta Anis, MD  DULoxetine (CYMBALTA) 60 MG capsule TAKE 1 CAPSULE(60 MG) BY MOUTH DAILY 07/08/20   Jose Persia, MD  famotidine (PEPCID) 20 MG tablet Take 1 tablet (20 mg total) by mouth daily as needed for heartburn or indigestion. 01/08/20   Jose Persia, MD  fluticasone (FLONASE) 50 MCG/ACT nasal spray Place 1 spray into both nostrils daily. Patient taking differently: Place 1 spray into both nostrils daily as needed for allergies.  03/22/18 03/22/19  Alphonzo Grieve, MD  HYDROcodone-acetaminophen (NORCO) 5-325 MG tablet Take 1 tablet by mouth every 8 (eight) hours as needed for moderate pain or severe pain. 07/08/20   Jose Persia, MD  losartan (COZAAR) 50 MG tablet Take 1 tablet (50 mg total) by mouth daily. 04/30/20   Mosetta Anis, MD  norethindrone (MICRONOR) 0.35 MG tablet Take 1 tablet (0.35 mg total) by mouth daily. 07/22/20   Caren Macadam, MD  traZODone (DESYREL) 50 MG tablet Take 1 tablet (50 mg total) by mouth at bedtime. 07/08/20   Jose Persia, MD  triamcinolone cream (KENALOG) 0.5 % Apply 1 application topically 2 (two) times daily. 05/06/20   Sanjuan Dame, MD    Allergies    Mobic [meloxicam], Penicillins, and Nicotine  Review of Systems   Review of Systems  All other systems reviewed and are negative.   Physical Exam Updated Vital Signs BP 119/72 (BP Location: Right Arm)   Pulse 80   Temp 98.4 F (36.9 C) (Oral)   Resp 16   Ht 5\' 3"  (1.6 m)   Wt 52.3 kg   LMP 07/08/2020 (Approximate)   SpO2 100%   BMI 20.42 kg/m   Physical Exam Vitals and nursing note reviewed.  Constitutional:       Appearance: She is well-developed.  HENT:     Head: Normocephalic and atraumatic.  Cardiovascular:     Rate and Rhythm: Normal rate and regular rhythm.  Pulmonary:     Effort: Pulmonary effort is normal. No respiratory distress.  Abdominal:     Tenderness: There is no rebound.  Musculoskeletal:     Comments: 2+ radial pulses bilaterally. Range of motion intact throughout the right upper extremity. The right axillary region has a well localized area of erythema, induration and tenderness that is approximately 2  x 1 cm in size.  Skin:    General: Skin is warm and dry.  Neurological:     Mental Status: She is alert and oriented to person, place, and time.  Psychiatric:        Behavior: Behavior normal.     ED Results / Procedures / Treatments   Labs (all labs ordered are listed, but only abnormal results are displayed) Labs Reviewed - No data to display  EKG None  Radiology No results found.  Procedures .Marland KitchenIncision and Drainage  Date/Time: 07/29/2020 7:32 AM Performed by: Quintella Reichert, MD Authorized by: Quintella Reichert, MD   Consent:    Consent obtained:  Verbal   Consent given by:  Patient   Risks discussed:  Bleeding, incomplete drainage, pain and infection Universal protocol:    Patient identity confirmed:  Verbally with patient Location:    Type:  Abscess   Size:  2x1cm   Location:  Upper extremity   Upper extremity location: right axilla. Pre-procedure details:    Skin preparation:  Povidone-iodine Anesthesia:    Anesthesia method:  Local infiltration   Local anesthetic:  Lidocaine 2% WITH epi Procedure type:    Complexity:  Simple Procedure details:    Ultrasound guidance: yes     Incision types:  Single straight   Wound management:  Probed and deloculated and irrigated with saline   Drainage:  Serosanguinous   Drainage amount:  Scant   Wound treatment:  Wound left open   Packing materials:  None     Medications Ordered in ED Medications   lidocaine-EPINEPHrine (XYLOCAINE W/EPI) 2 %-1:100000 (with pres) injection 20 mL (has no administration in time range)  lidocaine-EPINEPHrine (XYLOCAINE W/EPI) 2 %-1:200000 (PF) injection (has no administration in time range)    ED Course  I have reviewed the triage vital signs and the nursing notes.  Pertinent labs & imaging results that were available during my care of the patient were reviewed by me and considered in my medical decision making (see chart for details).    MDM Rules/Calculators/A&P                         Patient here for evaluation of right axillary swelling and tenderness. Examination with axillary abscess, already on antibiotics. I and D performed with return of small amount of serosanguinous fluid. Discussed continuing her antibiotics with outpatient follow-up for wound recheck.    Final Clinical Impression(s) / ED Diagnoses Final diagnoses:  Abscess of axillary region    Rx / DC Orders ED Discharge Orders    None       Quintella Reichert, MD 07/29/20 [phone]

## 2020-07-29 NOTE — ED Triage Notes (Signed)
Pt is present to the ED for a right armpit abscess. Pt was seen at Urgent Care on Wednesday and they told her that it was not ready to be drained and sent her home with abx.

## 2020-07-29 NOTE — ED Notes (Signed)
ED Provider at bedside. 

## 2020-07-29 NOTE — Discharge Instructions (Addendum)
Continue your antibiotics.  You may take ibuprofen or tylenol, available over the counter according to label instructions as needed for pain.

## 2020-07-30 ENCOUNTER — Telehealth: Payer: Self-pay | Admitting: *Deleted

## 2020-07-30 NOTE — Telephone Encounter (Signed)
Transition Care Management Follow-up Telephone Call  Date of discharge and from where: 07/30/2020 - [facility] ED  How have you been since you were released from the hospital? "I am okay"  Any questions or concerns? No  Items Reviewed:  Did the pt receive and understand the discharge instructions provided? Yes   Medications obtained and verified? Yes   Other? No   Any new allergies since your discharge? No   Dietary orders reviewed? No  Do you have support at home? Yes    Functional Questionnaire: (I = Independent and D = Dependent) ADLs: I  Bathing/Dressing- I  Meal Prep- I  Eating- I  Maintaining continence- I  Transferring/Ambulation- I  Managing Meds- I  Follow up appointments reviewed:   PCP Hospital f/u appt confirmed? No    Specialist Hospital f/u appt confirmed? No    Are transportation arrangements needed? No   If their condition worsens, is the pt aware to call PCP or go to the Emergency Dept.? Yes  Was the patient provided with contact information for the PCP's office or ED? Yes  Was to pt encouraged to call back with questions or concerns? Yes

## 2020-08-05 ENCOUNTER — Encounter: Payer: Self-pay | Admitting: Internal Medicine

## 2020-08-05 DIAGNOSIS — Z79891 Long term (current) use of opiate analgesic: Secondary | ICD-10-CM

## 2020-08-05 MED ORDER — HYDROCODONE-ACETAMINOPHEN 5-325 MG PO TABS: 1.0000 | ORAL_TABLET | Freq: Three times a day (TID) | ORAL | 0 refills | Status: DC | PRN

## 2020-08-05 NOTE — Telephone Encounter (Signed)
Last rx written 07/08/20. Last OV 07/08/20. Next OV  Has not been scheduled. UDS 01/08/20.

## 2020-08-06 DIAGNOSIS — M25561 Pain in right knee: Secondary | ICD-10-CM | POA: Diagnosis not present

## 2020-08-12 DIAGNOSIS — M25561 Pain in right knee: Secondary | ICD-10-CM | POA: Diagnosis not present

## 2020-09-03 ENCOUNTER — Encounter: Payer: Self-pay | Admitting: *Deleted

## 2020-09-04 DIAGNOSIS — M25561 Pain in right knee: Secondary | ICD-10-CM | POA: Diagnosis not present

## 2020-09-05 ENCOUNTER — Encounter: Payer: Self-pay | Admitting: Internal Medicine

## 2020-09-05 DIAGNOSIS — Z79891 Long term (current) use of opiate analgesic: Secondary | ICD-10-CM

## 2020-09-06 MED ORDER — HYDROCODONE-ACETAMINOPHEN 5-325 MG PO TABS: 1.0000 | ORAL_TABLET | Freq: Three times a day (TID) | ORAL | 0 refills | Status: DC | PRN

## 2020-09-10 ENCOUNTER — Other Ambulatory Visit: Payer: Self-pay | Admitting: *Deleted

## 2020-09-10 DIAGNOSIS — M5417 Radiculopathy, lumbosacral region: Secondary | ICD-10-CM

## 2020-09-10 MED ORDER — CYCLOBENZAPRINE HCL 5 MG PO TABS
5.0000 mg | ORAL_TABLET | Freq: Three times a day (TID) | ORAL | 2 refills | Status: DC | PRN
Start: 2020-09-10 — End: 2021-03-25

## 2020-09-30 DIAGNOSIS — H5213 Myopia, bilateral: Secondary | ICD-10-CM | POA: Diagnosis not present

## 2020-10-01 ENCOUNTER — Other Ambulatory Visit: Payer: Self-pay

## 2020-10-01 DIAGNOSIS — I10 Essential (primary) hypertension: Secondary | ICD-10-CM

## 2020-10-02 MED ORDER — ATENOLOL 25 MG PO TABS: ORAL_TABLET | ORAL | 1 refills | Status: DC

## 2020-10-03 ENCOUNTER — Encounter: Payer: Self-pay | Admitting: Internal Medicine

## 2020-10-03 DIAGNOSIS — Z79891 Long term (current) use of opiate analgesic: Secondary | ICD-10-CM

## 2020-10-03 MED ORDER — HYDROCODONE-ACETAMINOPHEN 5-325 MG PO TABS: 1.0000 | ORAL_TABLET | Freq: Three times a day (TID) | ORAL | 0 refills | Status: DC | PRN

## 2020-10-21 ENCOUNTER — Encounter: Payer: Self-pay | Admitting: Internal Medicine

## 2020-10-22 ENCOUNTER — Other Ambulatory Visit: Payer: Self-pay

## 2020-10-22 DIAGNOSIS — I10 Essential (primary) hypertension: Secondary | ICD-10-CM

## 2020-10-22 MED ORDER — LOSARTAN POTASSIUM 50 MG PO TABS: 50.0000 mg | ORAL_TABLET | Freq: Every day | ORAL | 1 refills | Status: DC

## 2020-11-15 ENCOUNTER — Encounter: Payer: Self-pay | Admitting: Internal Medicine

## 2020-11-18 ENCOUNTER — Other Ambulatory Visit: Payer: Self-pay | Admitting: Internal Medicine

## 2020-11-18 DIAGNOSIS — Z79891 Long term (current) use of opiate analgesic: Secondary | ICD-10-CM

## 2020-11-18 MED ORDER — HYDROCODONE-ACETAMINOPHEN 5-325 MG PO TABS: 1.0000 | ORAL_TABLET | Freq: Three times a day (TID) | ORAL | 0 refills | Status: DC | PRN

## 2020-12-13 ENCOUNTER — Encounter: Payer: Self-pay | Admitting: Internal Medicine

## 2020-12-14 ENCOUNTER — Other Ambulatory Visit: Payer: Self-pay | Admitting: Internal Medicine

## 2020-12-14 DIAGNOSIS — Z79891 Long term (current) use of opiate analgesic: Secondary | ICD-10-CM

## 2020-12-14 MED ORDER — HYDROCODONE-ACETAMINOPHEN 5-325 MG PO TABS: 1.0000 | ORAL_TABLET | Freq: Three times a day (TID) | ORAL | 0 refills | Status: DC | PRN

## 2020-12-14 MED ORDER — TRAZODONE HCL 50 MG PO TABS: 50.0000 mg | ORAL_TABLET | Freq: Every day | ORAL | 1 refills | Status: DC

## 2021-01-01 ENCOUNTER — Ambulatory Visit
Admission: EM | Admit: 2021-01-01 | Discharge: 2021-01-01 | Disposition: A | Discharge status: Home / Self Care | Payer: Medicaid Other | Attending: Physician Assistant | Admitting: Physician Assistant

## 2021-01-01 ENCOUNTER — Other Ambulatory Visit: Payer: Self-pay

## 2021-01-01 DIAGNOSIS — T148XXA Other injury of unspecified body region, initial encounter: Secondary | ICD-10-CM | POA: Diagnosis not present

## 2021-01-01 NOTE — ED Triage Notes (Signed)
Pt c/o bruise first noticed last Saturday located to anterior right thigh. Denies recalling any direct injury or trauma to the area.

## 2021-01-01 NOTE — ED Provider Notes (Signed)
[facility]    CSN: [id number] Arrival date & time: 01/01/21  1539      History   Chief Complaint Chief Complaint  Patient presents with   bruise    HPI Melissa Walker is a 40 y.o. female.   Here today for evaluation of a bruise to her right upper inner thigh that she first noticed a few days ago.  She denies any injury that would cause bruising.  She does state that it is somewhat painful today.  She has not had fever or chills.  She denies any lightheadedness.  Denies any chest pain or shortness of breath.  She denies any other bleeding from her nose or gums or other areas.  She does not have history of bleeding disorder.  The history is provided by the patient.   Past Medical History:  Diagnosis Date   Abnormal uterine bleeding 02/04/2012   Occurred 01/12/12-01/15/12. Evaluated in [facility]U and all labs, exam, and pelvic u/s WNL. Given rx for ibuprofen and told to follow up as otupatient.     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   PUD (peptic ulcer disease) 07/13/2018   Tobacco abuse    Underweight 10/26/2014    Patient Active Problem List   Diagnosis Date Noted   Rash of body 04/30/2020   Insomnia 01/08/2020   Macrocytic anemia 01/05/2019   History of ovarian cyst 07/13/2018   PUD (peptic ulcer disease) 07/13/2018   Hypertension 08/10/2016   Thyroid function study abnormality 03/30/2016   Long-term current use of opiate analgesic 02/27/2016   Adult acne 12/04/2014   Anxiety and depression 10/26/2014   Healthcare maintenance 10/26/2014   Lumbosacral radiculopathy 04/13/2008   TOBACCO ABUSE 02/02/2006    Past Surgical History:   Procedure Laterality Date   DENTAL REHABILITATION     full set dentures   LUMBAR LAMINECTOMY/DECOMPRESSION MICRODISCECTOMY Right 07/23/2014   Procedure: Right Lumbar five-Sacral one laminotomy and microdiskectomy;  Surgeon: Jovita Gamma, MD;  Location: [facility];  Service: Neurosurgery;  Laterality: Right;  Right Lumbar five-Sacral one laminotomy and microdiskectomy   Multiple epidural steroid injections in back     Over past 2 years   POSTERIOR LAMINECTOMY / DECOMPRESSION LUMBAR SPINE  06/23/2018   L5-S1 decompression    TUBAL LIGATION Bilateral 05/11/2013   Procedure: POST PARTUM TUBAL LIGATION;  Surgeon: Osborne Oman, MD;  Location: [facility];  Service: Gynecology;  Laterality: Bilateral;    OB History     Gravida  2   Para  2   Term  2   Preterm      AB      Living  2      SAB      IAB      Ectopic      Multiple      Live Births  2            Home Medications    Prior to Admission medications   Medication Sig Start Date End Date Taking? Authorizing Provider  atenolol (TENORMIN) 25 MG tablet TAKE 1/2 TABLET(12.5 MG) BY MOUTH DAILY 10/02/20  Jose Persia, MD  cyclobenzaprine (FLEXERIL) 5 MG tablet Take 1 tablet (5 mg total) by mouth 3 (three) times daily as needed for muscle spasms. 09/10/20   Jose Persia, MD  DULoxetine (CYMBALTA) 60 MG capsule TAKE 1 CAPSULE(60 MG) BY MOUTH DAILY 07/08/20   Jose Persia, MD  famotidine (PEPCID) 20 MG tablet Take 1 tablet (20 mg total) by mouth daily as needed for heartburn or indigestion. 01/08/20   Jose Persia, MD  fluticasone (FLONASE) 50 MCG/ACT nasal spray Place 1 spray into both nostrils daily. Patient taking differently: Place 1 spray into both nostrils daily as needed for allergies.  03/22/18 03/22/19  Alphonzo Grieve, MD  HYDROcodone-acetaminophen (NORCO) 5-325 MG tablet Take 1 tablet by mouth every 8 (eight) hours as needed for moderate pain or severe pain. 12/14/20   Jose Persia, MD  losartan  (COZAAR) 50 MG tablet Take 1 tablet (50 mg total) by mouth daily. 10/22/20   Madalyn Rob, MD  norethindrone (MICRONOR) 0.35 MG tablet Take 1 tablet (0.35 mg total) by mouth daily. 07/22/20   Caren Macadam, MD  traZODone (DESYREL) 50 MG tablet Take 1 tablet (50 mg total) by mouth at bedtime. 12/14/20   Jose Persia, MD  triamcinolone cream (KENALOG) 0.5 % Apply 1 application topically 2 (two) times daily. 05/06/20   Sanjuan Dame, MD    Family History Family History  Problem Relation Age of Onset   Cancer Father        brain tumor    Eclampsia Maternal Grandmother    Heart attack Maternal Grandmother    Cancer Maternal Grandmother        lung   Heart disease Maternal Grandmother    Hyperlipidemia Maternal Grandmother    Hypertension Maternal Grandmother     Social History Social History   Tobacco Use   Smoking status: Every Day    Packs/day: 0.50    Years: 18.00    Pack years: 9.00    Types: Cigarettes   Smokeless tobacco: Never  Vaping Use   Vaping Use: Never used  Substance Use Topics   Alcohol use: Yes    Alcohol/week: 0.0 standard drinks    Comment: occasional   Drug use: No     Allergies   Mobic [meloxicam], Penicillins, and Nicotine   Review of Systems Review of Systems  Constitutional:  Negative for chills and fever.  Eyes:  Negative for discharge and redness.  Respiratory:  Negative for shortness of breath.   Cardiovascular:  Negative for chest pain.  Skin:  Positive for color change.  Hematological:  Does not bruise/bleed easily.    Physical Exam Triage Vital Signs ED Triage Vitals  Enc Vitals Group     BP 01/01/21 1707 128/79     Pulse Rate 01/01/21 1707 91     Resp 01/01/21 1707 18     Temp 01/01/21 1707 97.9 F (36.6 C)     Temp Source 01/01/21 1707 Oral     SpO2 01/01/21 1707 95 %     Weight --      Height --      Head Circumference --      Peak Flow --      Pain Score 01/01/21 1708 0     Pain Loc --      Pain Edu? --       Excl. in [place]? --    No data found.  Updated Vital Signs BP 128/79 (BP Location: Right Arm)   Pulse 91   Temp  97.9 F (36.6 C) (Oral)   Resp 18   SpO2 95%       Physical Exam Vitals and nursing note reviewed.  Constitutional:      General: She is not in acute distress.    Appearance: Normal appearance. She is not ill-appearing.  Cardiovascular:     Rate and Rhythm: Normal rate.  Pulmonary:     Effort: Pulmonary effort is normal.  Skin:    Comments: Large bruise noted to her right upper inner thigh  Neurological:     Mental Status: She is alert.  Psychiatric:        Mood and Affect: Mood normal.        Behavior: Behavior normal.     UC Treatments / Results  Labs (all labs ordered are listed, but only abnormal results are displayed) Labs Reviewed  CBC WITH DIFFERENTIAL/PLATELET  BASIC METABOLIC PANEL    EKG   Radiology No results found.  Procedures Procedures (including critical care time)  Medications Ordered in UC Medications - No data to display  Initial Impression / Assessment and Plan / UC Course  I have reviewed the triage vital signs and the nursing notes.  Pertinent labs & imaging results that were available during my care of the patient were reviewed by me and considered in my medical decision making (see chart for details).   Routine labs ordered. Will await results for further recommendation.   Final Clinical Impressions(s) / UC Diagnoses   Final diagnoses:  Bruising   Discharge Instructions   None    ED Prescriptions   None    PDMP not reviewed this encounter.   Francene Finders, PA-C 01/01/21 1820

## 2021-01-03 LAB — BASIC METABOLIC PANEL
BUN/Creatinine Ratio: 7 — ABNORMAL LOW (ref 9–23)
BUN: 6 mg/dL (ref 6–24)
CO2: 22 mmol/L (ref 20–29)
Calcium: 9.2 mg/dL (ref 8.7–10.2)
Chloride: 103 mmol/L (ref 96–106)
Creatinine, Ser: 0.81 mg/dL (ref 0.57–1.00)
Glucose: 81 mg/dL (ref 70–99)
Potassium: 4 mmol/L (ref 3.5–5.2)
Sodium: 138 mmol/L (ref 134–144)
eGFR: 93 mL/min/{1.73_m2} (ref >59)

## 2021-01-03 LAB — CBC WITH DIFFERENTIAL/PLATELET
Basophils Absolute: 0 10*3/uL (ref 0.0–0.2)
Basos: 0 %
EOS (ABSOLUTE): 0.2 10*3/uL (ref 0.0–0.4)
Eos: 3 %
Hematocrit: 35.1 % (ref 34.0–46.6)
Hemoglobin: 12.2 g/dL (ref 11.1–15.9)
Immature Grans (Abs): 0 10*3/uL (ref 0.0–0.1)
Immature Granulocytes: 0 %
Lymphocytes Absolute: 2.6 10*3/uL (ref 0.7–3.1)
Lymphs: 33 %
MCH: 36.3 pg — ABNORMAL HIGH (ref 26.6–33.0)
MCHC: 34.8 g/dL (ref 31.5–35.7)
MCV: 105 fL — ABNORMAL HIGH (ref 79–97)
Monocytes Absolute: 0.4 10*3/uL (ref 0.1–0.9)
Monocytes: 6 %
Neutrophils Absolute: 4.6 10*3/uL (ref 1.4–7.0)
Neutrophils: 58 %
Platelets: 357 10*3/uL (ref 150–450)
RBC: 3.36 x10E6/uL — ABNORMAL LOW (ref 3.77–5.28)
RDW: 11.8 % (ref 11.7–15.4)
WBC: 7.9 10*3/uL (ref 3.4–10.8)

## 2021-01-20 DIAGNOSIS — M238X1 Other internal derangements of right knee: Secondary | ICD-10-CM | POA: Diagnosis not present

## 2021-01-20 DIAGNOSIS — M25561 Pain in right knee: Secondary | ICD-10-CM | POA: Diagnosis not present

## 2021-01-27 ENCOUNTER — Encounter: Payer: Self-pay | Admitting: Internal Medicine

## 2021-01-28 ENCOUNTER — Other Ambulatory Visit: Payer: Self-pay | Admitting: Internal Medicine

## 2021-01-28 DIAGNOSIS — Z79891 Long term (current) use of opiate analgesic: Secondary | ICD-10-CM

## 2021-01-28 MED ORDER — HYDROCODONE-ACETAMINOPHEN 5-325 MG PO TABS: 1.0000 | ORAL_TABLET | Freq: Three times a day (TID) | ORAL | 0 refills | Status: DC | PRN

## 2021-02-03 ENCOUNTER — Encounter: Payer: Medicaid Other | Admitting: Internal Medicine

## 2021-02-06 ENCOUNTER — Ambulatory Visit
Admission: EM | Admit: 2021-02-06 | Discharge: 2021-02-06 | Disposition: A | Discharge status: Home / Self Care | Payer: Medicaid Other | Attending: Emergency Medicine | Admitting: Emergency Medicine

## 2021-02-06 ENCOUNTER — Other Ambulatory Visit: Payer: Self-pay

## 2021-02-06 ENCOUNTER — Ambulatory Visit (HOSPITAL_BASED_OUTPATIENT_CLINIC_OR_DEPARTMENT_OTHER)
Admission: RE | Admit: 2021-02-06 | Discharge: 2021-02-06 | Disposition: A | Discharge status: Home / Self Care | Payer: Medicaid Other | Source: Ambulatory Visit | Attending: Emergency Medicine | Admitting: Emergency Medicine

## 2021-02-06 DIAGNOSIS — R0989 Other specified symptoms and signs involving the circulatory and respiratory systems: Secondary | ICD-10-CM

## 2021-02-06 DIAGNOSIS — R062 Wheezing: Secondary | ICD-10-CM | POA: Insufficient documentation

## 2021-02-06 DIAGNOSIS — F17200 Nicotine dependence, unspecified, uncomplicated: Secondary | ICD-10-CM

## 2021-02-06 DIAGNOSIS — J189 Pneumonia, unspecified organism: Secondary | ICD-10-CM

## 2021-02-06 DIAGNOSIS — Z0389 Encounter for observation for other suspected diseases and conditions ruled out: Secondary | ICD-10-CM | POA: Diagnosis not present

## 2021-02-06 MED ORDER — AZITHROMYCIN 500 MG PO TABS: 500.0000 mg | ORAL_TABLET | Freq: Every day | ORAL | 0 refills | Status: AC

## 2021-02-06 MED ORDER — CEFIXIME 400 MG PO CAPS
400.0000 mg | ORAL_CAPSULE | Freq: Every day | ORAL | 0 refills | Status: AC
Start: 2021-02-06 — End: 2021-02-11

## 2021-02-06 NOTE — Discharge Instructions (Addendum)
For presumed community-acquired pneumonia, please begin cefixime and azithromycin, please take all doses as prescribed.  I have also ordered you a test x-ray to be performed at the [place] location.  We will notify you of the results of the x-ray once it is complete.  Please continue using albuterol 2 puffs 4 times daily for the duration of your antibiotic treatment and longer as needed.  If you have not had complete resolution of your symptoms within the next 3 to 5 days, please feel free to follow-up for repeat evaluation.

## 2021-02-06 NOTE — ED Triage Notes (Addendum)
Pt reports of having a productive cough with green mucus and congestion since Sunday.

## 2021-02-06 NOTE — ED Provider Notes (Signed)
[facility]    CSN: [id number] Arrival date & time: 02/06/21  1051    HISTORY  No chief complaint on file.  HPI Melissa Walker is a 40 y.o. female. Pt reports of having a productive cough with green mucus and congestion for the past 5 days.  Patient states she is a current every day smoker.  Patient denies a history of COPD and asthma.  Patient states she is also had body aches chills and fever with some shortness of breath.   Past Medical History:  Diagnosis Date   Abnormal uterine bleeding 02/04/2012   Occurred 01/12/12-01/15/12. Evaluated in [facility] and all labs, exam, and pelvic u/s WNL. Given rx for ibuprofen and told to follow up as otupatient.     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   PUD (peptic ulcer disease) 07/13/2018   Tobacco abuse    Underweight 10/26/2014   Patient Active Problem List   Diagnosis Date Noted   Rash of body 04/30/2020   Insomnia 01/08/2020   Macrocytic anemia 01/05/2019   History of ovarian cyst 07/13/2018   PUD (peptic ulcer disease) 07/13/2018   Hypertension 08/10/2016   Thyroid function study abnormality 03/30/2016   Long-term current use of opiate analgesic 02/27/2016   Adult acne 12/04/2014   Anxiety and depression 10/26/2014   Healthcare maintenance 10/26/2014   Lumbosacral radiculopathy 04/13/2008   TOBACCO ABUSE 02/02/2006   Past Surgical History:  Procedure Laterality Date   DENTAL REHABILITATION     full set dentures   LUMBAR LAMINECTOMY/DECOMPRESSION MICRODISCECTOMY Right 07/23/2014   Procedure: Right Lumbar five-Sacral one laminotomy and microdiskectomy;  Surgeon: Jovita Gamma, MD;   Location: [facility];  Service: Neurosurgery;  Laterality: Right;  Right Lumbar five-Sacral one laminotomy and microdiskectomy   Multiple epidural steroid injections in back     Over past 2 years   POSTERIOR LAMINECTOMY / DECOMPRESSION LUMBAR SPINE  06/23/2018   L5-S1 decompression    TUBAL LIGATION Bilateral 05/11/2013   Procedure: POST PARTUM TUBAL LIGATION;  Surgeon: Osborne Oman, MD;  Location: [facility];  Service: Gynecology;  Laterality: Bilateral;   OB History     Gravida  2   Para  2   Term  2   Preterm      AB      Living  2      SAB      IAB      Ectopic      Multiple      Live Births  2          Home Medications    Prior to Admission medications   Medication Sig Start Date End Date Taking? Authorizing Provider  atenolol (TENORMIN) 25 MG tablet TAKE 1/2 TABLET(12.5 MG) BY MOUTH DAILY 10/02/20   Jose Persia, MD  cyclobenzaprine (FLEXERIL) 5 MG tablet Take 1 tablet (5 mg total) by mouth 3 (three) times daily as needed for muscle spasms. 09/10/20   Jose Persia, MD  DULoxetine (CYMBALTA) 60 MG capsule TAKE 1 CAPSULE(60 MG) BY MOUTH DAILY 07/08/20   Jose Persia, MD  famotidine (PEPCID) 20 MG tablet  Take 1 tablet (20 mg total) by mouth daily as needed for heartburn or indigestion. 01/08/20   Jose Persia, MD  fluticasone (FLONASE) 50 MCG/ACT nasal spray Place 1 spray into both nostrils daily. Patient taking differently: Place 1 spray into both nostrils daily as needed for allergies.  03/22/18 03/22/19  Alphonzo Grieve, MD  HYDROcodone-acetaminophen (NORCO) 5-325 MG tablet Take 1 tablet by mouth every 8 (eight) hours as needed for moderate pain or severe pain. 01/28/21   Jose Persia, MD  losartan (COZAAR) 50 MG tablet Take 1 tablet (50 mg total) by mouth daily. 10/22/20   Madalyn Rob, MD  norethindrone (MICRONOR) 0.35 MG tablet Take 1 tablet (0.35 mg total) by mouth daily. 07/22/20   Caren Macadam, MD  traZODone (DESYREL) 50 MG tablet Take 1  tablet (50 mg total) by mouth at bedtime. 12/14/20   Jose Persia, MD  triamcinolone cream (KENALOG) 0.5 % Apply 1 application topically 2 (two) times daily. 05/06/20   Sanjuan Dame, MD   Family History Family History  Problem Relation Age of Onset   Cancer Father        brain tumor    Eclampsia Maternal Grandmother    Heart attack Maternal Grandmother    Cancer Maternal Grandmother        lung   Heart disease Maternal Grandmother    Hyperlipidemia Maternal Grandmother    Hypertension Maternal Grandmother    Social History Social History   Tobacco Use   Smoking status: Every Day    Packs/day: 0.50    Years: 18.00    Pack years: 9.00    Types: Cigarettes   Smokeless tobacco: Never  Vaping Use   Vaping Use: Never used  Substance Use Topics   Alcohol use: Yes    Alcohol/week: 0.0 standard drinks    Comment: occasional   Drug use: No   Allergies   Mobic [meloxicam], Penicillins, and Nicotine  Review of Systems Review of Systems Pertinent findings noted in history of present illness.   Physical Exam Triage Vital Signs ED Triage Vitals  Enc Vitals Group     BP 12/27/20 0827 (!) 147/82     Pulse Rate 12/27/20 0827 72     Resp 12/27/20 0827 18     Temp 12/27/20 0827 98.3 F (36.8 C)     Temp Source 12/27/20 0827 Oral     SpO2 12/27/20 0827 98 %     Weight --      Height --      Head Circumference --      Peak Flow --      Pain Score 12/27/20 0826 5     Pain Loc --      Pain Edu? --      Excl. in [place]? --   No data found.  Updated Vital Signs BP 139/83 (BP Location: Left Arm)   Pulse (!) 106   Temp 98 F (36.7 C) (Oral)   Resp 18   LMP 01/08/2021 (Approximate)   SpO2 98%   Physical Exam Vitals and nursing note reviewed.  Constitutional:      General: She is not in acute distress.    Appearance: She is ill-appearing.  HENT:     Head: Normocephalic and atraumatic.     Salivary Glands: Right salivary gland is not diffusely enlarged or tender. Left  salivary gland is not diffusely enlarged or tender.     Right Ear: Tympanic membrane, ear canal and external ear normal. No drainage. No middle  ear effusion. There is no impacted cerumen. Tympanic membrane is not erythematous or bulging.     Left Ear: Tympanic membrane, ear canal and external ear normal. No drainage.  No middle ear effusion. There is no impacted cerumen. Tympanic membrane is not erythematous or bulging.     Nose: Mucosal edema, congestion and rhinorrhea present. No nasal deformity or septal deviation. Rhinorrhea is purulent.     Right Turbinates: Not enlarged, swollen or pale.     Left Turbinates: Not enlarged, swollen or pale.     Right Sinus: No maxillary sinus tenderness or frontal sinus tenderness.     Left Sinus: No maxillary sinus tenderness or frontal sinus tenderness.     Mouth/Throat:     Lips: Pink. No lesions.     Mouth: Mucous membranes are moist. No oral lesions.     Pharynx: Oropharynx is clear. Uvula midline. No posterior oropharyngeal erythema or uvula swelling.     Tonsils: No tonsillar exudate. 0 on the right. 0 on the left.  Eyes:     General: Lids are normal.        Right eye: No discharge.        Left eye: No discharge.     Extraocular Movements: Extraocular movements intact.     Conjunctiva/sclera: Conjunctivae normal.     Right eye: Right conjunctiva is not injected.     Left eye: Left conjunctiva is not injected.  Neck:     Trachea: Trachea and phonation normal.  Cardiovascular:     Rate and Rhythm: Normal rate and regular rhythm.     Pulses: Normal pulses.     Heart sounds: Normal heart sounds. No murmur heard.   No friction rub. No gallop.  Pulmonary:     Effort: Pulmonary effort is normal. No accessory muscle usage, prolonged expiration or respiratory distress.     Breath sounds: No stridor, decreased air movement or transmitted upper airway sounds. Examination of the left-upper field reveals wheezing and rales. Examination of the right-middle  field reveals wheezing. Examination of the left-middle field reveals wheezing and rales. Examination of the right-lower field reveals wheezing. Wheezing and rales present. No decreased breath sounds or rhonchi.  Chest:     Chest wall: No tenderness.  Musculoskeletal:        General: Normal range of motion.     Cervical back: Normal range of motion and neck supple. Normal range of motion.  Lymphadenopathy:     Cervical: No cervical adenopathy.  Skin:    General: Skin is warm and dry.     Findings: No erythema or rash.  Neurological:     General: No focal deficit present.     Mental Status: She is alert and oriented to person, place, and time.  Psychiatric:        Mood and Affect: Mood normal.        Behavior: Behavior normal.    Visual Acuity Right Eye Distance:   Left Eye Distance:   Bilateral Distance:    Right Eye Near:   Left Eye Near:    Bilateral Near:     UC Couse / Diagnostics / Procedures:    EKG  Radiology No results found.  Procedures Procedures (including critical care time)  UC Diagnoses / Final Clinical Impressions(s)   I have reviewed the triage vital signs and the nursing notes.  Pertinent labs & imaging results that were available during my care of the patient were reviewed by me and considered in my medical  decision making (see chart for details).   Final diagnoses:  Community acquired pneumonia, unspecified laterality  Tobacco dependence  Wheezing  Rales   Patient will be treated empirically for presumed community-acquired pneumonia for 5 days.  X-ray ordered.  Viral testing not indicated at this point.  ED Prescriptions     Medication Sig Dispense Auth. Provider   cefixime (SUPRAX) 400 MG CAPS capsule Take 1 capsule (400 mg total) by mouth daily for 5 days. 5 capsule Lynden Oxford Scales, PA-C   azithromycin (ZITHROMAX) 500 MG tablet Take 1 tablet (500 mg total) by mouth daily for 3 days. Take first 2 tablets together, then 1 every day until  finished. 3 tablet Lynden Oxford Scales, PA-C      PDMP not reviewed this encounter.  Pending results:  Labs Reviewed - No data to display  Medications Ordered in UC: Medications - No data to display  Disposition Upon Discharge:  Condition: stable for discharge home Home: take medications as prescribed; routine discharge instructions as discussed; follow up as advised.  Patient presented with an acute illness with associated systemic symptoms and significant discomfort requiring urgent management. In my opinion, this is a condition that a prudent lay person (someone who possesses an average knowledge of health and medicine) may potentially expect to result in complications if not addressed urgently such as respiratory distress, impairment of bodily function or dysfunction of bodily organs.   Routine symptom specific, illness specific and/or disease specific instructions were discussed with the patient and/or caregiver at length.   As such, the patient has been evaluated and assessed, work-up was performed and treatment was provided in alignment with urgent care protocols and evidence based medicine.  Patient/parent/caregiver has been advised that the patient may require follow up for further testing and treatment if the symptoms continue in spite of treatment, as clinically indicated and appropriate.  The patient was tested for COVID-19, Influenza and/or RSV, then the patient/parent/guardian was advised to isolate at home pending the results of his/her diagnostic coronavirus test and potentially longer if they're positive. I have also advised pt that if his/her COVID-19 test returns positive, it's recommended to self-isolate for at least 10 days after symptoms first appeared AND until fever-free for 24 hours without fever reducer AND other symptoms have improved or resolved. Discussed self-isolation recommendations as well as instructions for household member/close contacts as per the [facility] and  [place] DHHS, and also gave patient the [place] packet with this information.  Patient/parent/caregiver has been advised to return to the [facility] or PCP in 3-5 days if no better; to PCP or the Emergency Department if new signs and symptoms develop, or if the current signs or symptoms continue to change or worsen for further workup, evaluation and treatment as clinically indicated and appropriate  The patient will follow up with their current PCP if and as advised. If the patient does not currently have a PCP we will assist them in obtaining one.   The patient may need specialty follow up if the symptoms continue, in spite of conservative treatment and management, for further workup, evaluation, consultation and treatment as clinically indicated and appropriate.  Patient/parent/caregiver verbalized understanding and agreement of plan as discussed.  All questions were addressed during visit.  Please see discharge instructions below for further details of plan.  Discharge Instructions:   Discharge Instructions      For presumed community-acquired pneumonia, please begin cefixime and azithromycin, please take all doses as prescribed.  I have also ordered you a  test x-ray to be performed at the [place] location.  We will notify you of the results of the x-ray once it is complete.  Please continue using albuterol 2 puffs 4 times daily for the duration of your antibiotic treatment and longer as needed.  If you have not had complete resolution of your symptoms within the next 3 to 5 days, please feel free to follow-up for repeat evaluation.        Lynden Oxford Scales, PA-C 02/06/21 1239

## 2021-02-07 ENCOUNTER — Emergency Department (HOSPITAL_BASED_OUTPATIENT_CLINIC_OR_DEPARTMENT_OTHER)
Admission: EM | Admit: 2021-02-07 | Discharge: 2021-02-07 | Disposition: A | Discharge status: Home / Self Care | Payer: Medicaid Other | Attending: Emergency Medicine | Admitting: Emergency Medicine

## 2021-02-07 ENCOUNTER — Other Ambulatory Visit: Payer: Self-pay

## 2021-02-07 ENCOUNTER — Encounter (HOSPITAL_BASED_OUTPATIENT_CLINIC_OR_DEPARTMENT_OTHER): Payer: Self-pay

## 2021-02-07 DIAGNOSIS — Z20822 Contact with and (suspected) exposure to covid-19: Secondary | ICD-10-CM | POA: Insufficient documentation

## 2021-02-07 DIAGNOSIS — Z79899 Other long term (current) drug therapy: Secondary | ICD-10-CM | POA: Diagnosis not present

## 2021-02-07 DIAGNOSIS — F17210 Nicotine dependence, cigarettes, uncomplicated: Secondary | ICD-10-CM | POA: Diagnosis not present

## 2021-02-07 DIAGNOSIS — I10 Essential (primary) hypertension: Secondary | ICD-10-CM | POA: Insufficient documentation

## 2021-02-07 DIAGNOSIS — R051 Acute cough: Secondary | ICD-10-CM | POA: Diagnosis not present

## 2021-02-07 DIAGNOSIS — R000 Tachycardia, unspecified: Secondary | ICD-10-CM | POA: Insufficient documentation

## 2021-02-07 DIAGNOSIS — R059 Cough, unspecified: Secondary | ICD-10-CM | POA: Diagnosis not present

## 2021-02-07 LAB — BASIC METABOLIC PANEL
Anion gap: 10 (ref 5–15)
BUN: 6 mg/dL (ref 6–20)
CO2: 27 mmol/L (ref 22–32)
Calcium: 9.4 mg/dL (ref 8.9–10.3)
Chloride: 99 mmol/L (ref 98–111)
Creatinine, Ser: 0.94 mg/dL (ref 0.44–1.00)
GFR, Estimated: >60 mL/min (ref >60)
Glucose, Bld: 115 mg/dL — ABNORMAL HIGH (ref 70–99)
Potassium: 3.9 mmol/L (ref 3.5–5.1)
Sodium: 136 mmol/L (ref 135–145)

## 2021-02-07 LAB — CBC
HCT: 45.9 % (ref 36.0–46.0)
Hemoglobin: 15.8 g/dL — ABNORMAL HIGH (ref 12.0–15.0)
MCH: 37.4 pg — ABNORMAL HIGH (ref 26.0–34.0)
MCHC: 34.4 g/dL (ref 30.0–36.0)
MCV: 108.5 fL — ABNORMAL HIGH (ref 80.0–100.0)
Platelets: 425 10*3/uL — ABNORMAL HIGH (ref 150–400)
RBC: 4.23 MIL/uL (ref 3.87–5.11)
RDW: 13.2 % (ref 11.5–15.5)
WBC: 8.2 10*3/uL (ref 4.0–10.5)
nRBC: 0 % (ref 0.0–0.2)

## 2021-02-07 LAB — TROPONIN I (HIGH SENSITIVITY)
Troponin I (High Sensitivity): <2 ng/L (ref <18)
Troponin I (High Sensitivity): <2 ng/L (ref <18)

## 2021-02-07 LAB — D-DIMER, QUANTITATIVE: D-Dimer, Quant: <0.27 ug/mL-FEU (ref 0.00–0.50)

## 2021-02-07 LAB — RESP PANEL BY RT-PCR (FLU A&B, COVID) ARPGX2
Influenza A by PCR: NEGATIVE
Influenza B by PCR: NEGATIVE
SARS Coronavirus 2 by RT PCR: NEGATIVE

## 2021-02-07 MED ORDER — SODIUM CHLORIDE 0.9 % IV BOLUS
1000.0000 mL | Freq: Once | INTRAVENOUS | Status: AC
  Administered 2021-02-07: 1000 mL via INTRAVENOUS

## 2021-02-07 MED ORDER — FENTANYL CITRATE PF 50 MCG/ML IJ SOSY
50.0000 ug | PREFILLED_SYRINGE | Freq: Once | INTRAMUSCULAR | Status: AC
  Administered 2021-02-07: 50 ug via INTRAVENOUS
  Filled 2021-02-07: qty 1

## 2021-02-07 NOTE — ED Triage Notes (Signed)
Pt arrives ambulatory to ED with c/o rapid heart rate starting this afternoon when she woke up. Pt reports she had a cough starting Sunday, was seen at [facility] yesterday and had chest XR that she states was clear on her my chart. Pt reports cough with productive brown green sputum.

## 2021-02-07 NOTE — ED Provider Notes (Signed)
[facility] Provider Note   CSN: [id number] Arrival date & time: 02/07/21  1431     History Chief Complaint  Patient presents with   Tachycardia    [facility] Walker is a 40 y.o. female.  Presents to ER with concern for elevated heart rate, cough.  Has been having cough over the past week.  Started Sunday.  Went to an urgent care and was given antibiotics and had a chest x-ray completed.  Today she felt like her heart was racing and states that her heart rate at home was up to 120s.  Today he noticed slight brown-green appearing sputum.  No blood.  She also has had some associated chest pain, central, nonradiating, not associate with exertion.  Does not feel particularly short of breath right now.  Per review of chart, patient has had PCP visit regarding sinus tachycardia in 2021.  Endorses smoking history.  HPI     Past Medical History:  Diagnosis Date   Abnormal uterine bleeding 02/04/2012   Occurred 01/12/12-01/15/12. Evaluated in [facility]U and all labs, exam, and pelvic u/s WNL. Given rx for ibuprofen and told to follow up as otupatient.     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   PUD (peptic ulcer disease) 07/13/2018   Tobacco abuse    Underweight 10/26/2014    Patient Active Problem List   Diagnosis Date Noted   Rash of body 04/30/2020   Insomnia 01/08/2020   Macrocytic anemia 01/05/2019   History of ovarian cyst 07/13/2018   PUD (peptic ulcer disease) 07/13/2018   Hypertension 08/10/2016   Thyroid function study abnormality 03/30/2016   Long-term current use of opiate analgesic 02/27/2016   Adult acne 12/04/2014    Anxiety and depression 10/26/2014   Healthcare maintenance 10/26/2014   Lumbosacral radiculopathy 04/13/2008   TOBACCO ABUSE 02/02/2006    Past Surgical History:  Procedure Laterality Date   DENTAL REHABILITATION     full set dentures   LUMBAR LAMINECTOMY/DECOMPRESSION MICRODISCECTOMY Right 07/23/2014   Procedure: Right Lumbar five-Sacral one laminotomy and microdiskectomy;  Surgeon: Jovita Gamma, MD;  Location: [facility];  Service: Neurosurgery;  Laterality: Right;  Right Lumbar five-Sacral one laminotomy and microdiskectomy   Multiple epidural steroid injections in back     Over past 2 years   POSTERIOR LAMINECTOMY / DECOMPRESSION LUMBAR SPINE  06/23/2018   L5-S1 decompression    TUBAL LIGATION Bilateral 05/11/2013   Procedure: POST PARTUM TUBAL LIGATION;  Surgeon: Osborne Oman, MD;  Location: [facility];  Service: Gynecology;  Laterality: Bilateral;     OB History     Gravida  2   Para  2   Term  2   Preterm      AB      Living  2      SAB      IAB      Ectopic      Multiple      Live Births  2           Family History  Problem Relation Age of Onset   Cancer Father  brain tumor    Eclampsia Maternal Grandmother    Heart attack Maternal Grandmother    Cancer Maternal Grandmother        lung   Heart disease Maternal Grandmother    Hyperlipidemia Maternal Grandmother    Hypertension Maternal Grandmother     Social History   Tobacco Use   Smoking status: Every Day    Packs/day: 0.50    Years: 18.00    Pack years: 9.00    Types: Cigarettes   Smokeless tobacco: Never  Vaping Use   Vaping Use: Never used  Substance Use Topics   Alcohol use: Yes    Alcohol/week: 0.0 standard drinks    Comment: occasional   Drug use: No    Home Medications Prior to Admission medications   Medication Sig Start Date End Date Taking? Authorizing Provider  atenolol (TENORMIN) 25 MG tablet TAKE 1/2 TABLET(12.5 MG) BY MOUTH DAILY 10/02/20   Jose Persia, MD  azithromycin (ZITHROMAX) 500 MG tablet Take 1 tablet (500 mg total) by mouth daily for 3 days. Take first 2 tablets together, then 1 every day until finished. 02/06/21 02/09/21  Lynden Oxford Scales, PA-C  cefixime (SUPRAX) 400 MG CAPS capsule Take 1 capsule (400 mg total) by mouth daily for 5 days. 02/06/21 02/11/21  Lynden Oxford Scales, PA-C  cyclobenzaprine (FLEXERIL) 5 MG tablet Take 1 tablet (5 mg total) by mouth 3 (three) times daily as needed for muscle spasms. 09/10/20   Jose Persia, MD  DULoxetine (CYMBALTA) 60 MG capsule TAKE 1 CAPSULE(60 MG) BY MOUTH DAILY 07/08/20   Jose Persia, MD  famotidine (PEPCID) 20 MG tablet Take 1 tablet (20 mg total) by mouth daily as needed for heartburn or indigestion. 01/08/20   Jose Persia, MD  fluticasone (FLONASE) 50 MCG/ACT nasal spray Place 1 spray into both nostrils daily. Patient taking differently: Place 1 spray into both nostrils daily as needed for allergies.  03/22/18 03/22/19  Alphonzo Grieve, MD  HYDROcodone-acetaminophen (NORCO) 5-325 MG tablet Take 1 tablet by mouth every 8 (eight) hours as needed for moderate pain or severe pain. 01/28/21   Jose Persia, MD  losartan (COZAAR) 50 MG tablet Take 1 tablet (50 mg total) by mouth daily. 10/22/20   Madalyn Rob, MD  norethindrone (MICRONOR) 0.35 MG tablet Take 1 tablet (0.35 mg total) by mouth daily. 07/22/20   Caren Macadam, MD  traZODone (DESYREL) 50 MG tablet Take 1 tablet (50 mg total) by mouth at bedtime. 12/14/20   Jose Persia, MD  triamcinolone cream (KENALOG) 0.5 % Apply 1 application topically 2 (two) times daily. 05/06/20   Sanjuan Dame, MD    Allergies    Mobic [meloxicam], Penicillins, and Nicotine  Review of Systems   Review of Systems  Constitutional:  Positive for fatigue. Negative for chills and fever.  HENT:  Negative for ear pain and sore throat.   Eyes:  Negative for pain and visual disturbance.  Respiratory:  Positive for cough and  shortness of breath.   Cardiovascular:  Positive for chest pain and palpitations.  Gastrointestinal:  Negative for abdominal pain and vomiting.  Genitourinary:  Negative for dysuria and hematuria.  Musculoskeletal:  Negative for arthralgias and back pain.  Skin:  Negative for color change and rash.  Neurological:  Negative for seizures and syncope.  All other systems reviewed and are negative.  Physical Exam Updated Vital Signs BP 134/75   Pulse 90   Temp 98.3 F (36.8 C) (Oral)   Resp 14  Ht 5\' 3"  (1.6 m)   Wt 54.4 kg   LMP 01/08/2021 (Approximate)   SpO2 99%   BMI 21.26 kg/m   Physical Exam Vitals and nursing note reviewed.  Constitutional:      General: She is not in acute distress.    Appearance: She is well-developed.  HENT:     Head: Normocephalic and atraumatic.  Eyes:     Conjunctiva/sclera: Conjunctivae normal.  Cardiovascular:     Rate and Rhythm: Normal rate and regular rhythm.     Heart sounds: No murmur heard. Pulmonary:     Effort: Pulmonary effort is normal. No respiratory distress.     Breath sounds: Normal breath sounds.  Abdominal:     Palpations: Abdomen is soft.     Tenderness: There is no abdominal tenderness.  Musculoskeletal:        General: No swelling.     Cervical back: Neck supple.  Skin:    General: Skin is warm and dry.     Capillary Refill: Capillary refill takes less than 2 seconds.  Neurological:     Mental Status: She is alert.  Psychiatric:        Mood and Affect: Mood normal.    ED Results / Procedures / Treatments   Labs (all labs ordered are listed, but only abnormal results are displayed) Labs Reviewed  BASIC METABOLIC PANEL - Abnormal; Notable for the following components:      Result Value   Glucose, Bld 115 (*)    All other components within normal limits  CBC - Abnormal; Notable for the following components:   Hemoglobin 15.8 (*)    MCV 108.5 (*)    MCH 37.4 (*)    Platelets 425 (*)    All other components  within normal limits  RESP PANEL BY RT-PCR (FLU A&B, COVID) ARPGX2  D-DIMER, QUANTITATIVE  TSH  TROPONIN I (HIGH SENSITIVITY)  TROPONIN I (HIGH SENSITIVITY)    EKG None  Radiology DG Chest 2 View  Result Date: 02/06/2021 CLINICAL DATA:  Concern for pneumonia EXAM: CHEST - 2 VIEW COMPARISON:  03/25/2017 FINDINGS: The heart size and mediastinal contours are within normal limits. Both lungs are clear. The visualized skeletal structures are unremarkable. Nipple shadows noted bilaterally. Trachea midline. Nonobstructive bowel gas pattern. IMPRESSION: No active cardiopulmonary disease. Electronically Signed   By: Jerilynn Mages.  Shick M.D.   On: 02/06/2021 15:01    Procedures Procedures   Medications Ordered in ED Medications  sodium chloride 0.9 % bolus 1,000 mL (0 mLs Intravenous Stopped 02/07/21 1730)  fentaNYL (SUBLIMAZE) injection 50 mcg (50 mcg Intravenous Given 02/07/21 1637)    ED Course  I have reviewed the triage vital signs and the nursing notes.  Pertinent labs & imaging results that were available during my care of the patient were reviewed by me and considered in my medical decision making (see chart for details).    MDM Rules/Calculators/A&P                           40 year old lady presents to ER with concern for cough, chest pain, fast heart rate.  On exam she appears well in no acute distress.  EKG showing sinus rhythm, without acute ischemic change troponin x2 is within normal limits, doubt ACS.  D-dimer is within normal limits, no associated hypoxia or tachypnea, doubt pulmonary embolism.  CXR performed yesterday was normal without infiltrate.  COVID and flu negative.  On reassessment patient remains well-appearing.  Denies ongoing symptoms at present.  Heart rate did improved after receiving some IV fluids.  Suspect symptoms most likely related to viral upper respiratory illness, cough.  Given her overall well appearance, work-up today, believe she is stable for discharge and  outpatient management.  Recommended that she follow-up with her primary care doctor, reviewed return precautions and discharged.    After the discussed management above, the patient was determined to be safe for discharge.  The patient was in agreement with this plan and all questions regarding their care were answered.  ED return precautions were discussed and the patient will return to the ED with any significant worsening of condition.  Final Clinical Impression(s) / ED Diagnoses Final diagnoses:  Sinus tachycardia  Acute cough    Rx / DC Orders ED Discharge Orders     None        Lucrezia Starch, MD 02/07/21 1801

## 2021-02-07 NOTE — Discharge Instructions (Addendum)
Please follow-up with your primary care doctor early next week.  Come back to ER if you develop difficulty breathing chest pain, episodes of passing out or other new concerning symptom.

## 2021-02-08 LAB — TSH: TSH: 1.097 u[IU]/mL (ref 0.350–4.500)

## 2021-02-10 ENCOUNTER — Emergency Department (HOSPITAL_BASED_OUTPATIENT_CLINIC_OR_DEPARTMENT_OTHER)
Admission: EM | Admit: 2021-02-10 | Discharge: 2021-02-10 | Disposition: A | Discharge status: Home / Self Care | Payer: Medicaid Other | Attending: Emergency Medicine | Admitting: Emergency Medicine

## 2021-02-10 ENCOUNTER — Encounter: Payer: Medicaid Other | Admitting: Internal Medicine

## 2021-02-10 ENCOUNTER — Other Ambulatory Visit: Payer: Self-pay

## 2021-02-10 ENCOUNTER — Encounter (HOSPITAL_BASED_OUTPATIENT_CLINIC_OR_DEPARTMENT_OTHER): Payer: Self-pay | Admitting: *Deleted

## 2021-02-10 ENCOUNTER — Telehealth: Payer: Self-pay | Admitting: *Deleted

## 2021-02-10 DIAGNOSIS — F17210 Nicotine dependence, cigarettes, uncomplicated: Secondary | ICD-10-CM | POA: Diagnosis not present

## 2021-02-10 DIAGNOSIS — R001 Bradycardia, unspecified: Secondary | ICD-10-CM | POA: Diagnosis not present

## 2021-02-10 DIAGNOSIS — R002 Palpitations: Secondary | ICD-10-CM | POA: Insufficient documentation

## 2021-02-10 DIAGNOSIS — I10 Essential (primary) hypertension: Secondary | ICD-10-CM | POA: Insufficient documentation

## 2021-02-10 DIAGNOSIS — Z79899 Other long term (current) drug therapy: Secondary | ICD-10-CM | POA: Diagnosis not present

## 2021-02-10 DIAGNOSIS — R059 Cough, unspecified: Secondary | ICD-10-CM | POA: Diagnosis not present

## 2021-02-10 DIAGNOSIS — M549 Dorsalgia, unspecified: Secondary | ICD-10-CM | POA: Diagnosis not present

## 2021-02-10 LAB — RAPID URINE DRUG SCREEN, HOSP PERFORMED
Amphetamines: NOT DETECTED
Barbiturates: NOT DETECTED
Benzodiazepines: NOT DETECTED
Cocaine: NOT DETECTED
Opiates: NOT DETECTED
Tetrahydrocannabinol: NOT DETECTED

## 2021-02-10 LAB — PREGNANCY, URINE: Preg Test, Ur: NEGATIVE

## 2021-02-10 NOTE — ED Triage Notes (Signed)
To ER via EMS with heart palpitations. She was evaluated for same 3 days ago. Pupils dilated.

## 2021-02-10 NOTE — Discharge Instructions (Signed)
Schedule an appointment with [facility] health cardiology.  Call them today to set up an appointment to get fitted with a cardiac monitor. Call your primary care doctor after discharge and set up follow-up this week. Continue the medication you are currently on.

## 2021-02-10 NOTE — Telephone Encounter (Signed)
Call from patient stated went to the ER for Elevated HR was told that she did not have Pneumonia.  Stated that she feels like her heart is going to jump out of her check.  Patient currently at home alone to call EMS for transport to ER.  Has follow  up appointment  in the Clinics this afternoon with Dr. Court Joy.

## 2021-02-10 NOTE — ED Provider Notes (Signed)
[facility] EMERGENCY DEPARTMENT Provider Note   CSN: [id number] Arrival date & time: 02/10/21  1133     History Chief Complaint  Patient presents with   Palpitations    Melissa Walker is a 40 y.o. female.  HPI  Patient presents with palpitations.  She was seen for this 3 days ago.  Reports that for about a week now she has been having intermittent palpitations that feels like her heart is pounding out of her chest.  Previously there was associated chest pain or tightness, there is none today.  Happened last night and this morning, lasted for less than 5 minutes each time.  Unable to identify any alleviating factors at that time.  Not positional in nature, no obvious aggravating factors.  Denies any changes in medicine recently, does not feel short of breath.  No recent surgeries or travel, not on any oral birth control, no history of blood clots or MI.  No cardiac history although she did have tachycardia worked up in 2019.  She denies any nausea or vomiting, has been having a cough for about a week.  Daily cigarette smoker.  Past Medical History:  Diagnosis Date   Abnormal uterine bleeding 02/04/2012   Occurred 01/12/12-01/15/12. Evaluated in MAU and all labs, exam, and pelvic u/s WNL. Given rx for ibuprofen and told to follow up as otupatient.     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   PUD (peptic ulcer disease) 07/13/2018   Tobacco abuse    Underweight 10/26/2014    Patient Active Problem List   Diagnosis Date Noted   Rash of body 04/30/2020   Insomnia 01/08/2020   Macrocytic anemia 01/05/2019   History of ovarian cyst 07/13/2018   PUD  (peptic ulcer disease) 07/13/2018   Hypertension 08/10/2016   Thyroid function study abnormality 03/30/2016   Long-term current use of opiate analgesic 02/27/2016   Adult acne 12/04/2014   Anxiety and depression 10/26/2014   Healthcare maintenance 10/26/2014   Lumbosacral radiculopathy 04/13/2008   TOBACCO ABUSE 02/02/2006    Past Surgical History:  Procedure Laterality Date   DENTAL REHABILITATION     full set dentures   LUMBAR LAMINECTOMY/DECOMPRESSION MICRODISCECTOMY Right 07/23/2014   Procedure: Right Lumbar five-Sacral one laminotomy and microdiskectomy;  Surgeon: Jovita Gamma, MD;  Location: [facility];  Service: Neurosurgery;  Laterality: Right;  Right Lumbar five-Sacral one laminotomy and microdiskectomy   Multiple epidural steroid injections in back     Over past 2 years   POSTERIOR LAMINECTOMY / DECOMPRESSION LUMBAR SPINE  06/23/2018   L5-S1 decompression    TUBAL LIGATION Bilateral 05/11/2013   Procedure: POST PARTUM TUBAL LIGATION;  Surgeon: Osborne Oman, MD;  Location: [facility];  Service: Gynecology;  Laterality: Bilateral;     OB History     Gravida  2   Para  2   Term  2   Preterm      AB      Living  2      SAB      IAB      Ectopic      Multiple  Live Births  2           Family History  Problem Relation Age of Onset   Cancer Father        brain tumor    Eclampsia Maternal Grandmother    Heart attack Maternal Grandmother    Cancer Maternal Grandmother        lung   Heart disease Maternal Grandmother    Hyperlipidemia Maternal Grandmother    Hypertension Maternal Grandmother     Social History   Tobacco Use   Smoking status: Every Day    Packs/day: 0.50    Years: 18.00    Pack years: 9.00    Types: Cigarettes   Smokeless tobacco: Never  Vaping Use   Vaping Use: Never used  Substance Use Topics   Alcohol use: Yes    Alcohol/week: 0.0 standard drinks    Comment: occasional   Drug use: No    Home  Medications Prior to Admission medications   Medication Sig Start Date End Date Taking? Authorizing Provider  atenolol (TENORMIN) 25 MG tablet TAKE 1/2 TABLET(12.5 MG) BY MOUTH DAILY 10/02/20   Jose Persia, MD  cefixime (SUPRAX) 400 MG CAPS capsule Take 1 capsule (400 mg total) by mouth daily for 5 days. 02/06/21 02/11/21  Lynden Oxford Scales, PA-C  cyclobenzaprine (FLEXERIL) 5 MG tablet Take 1 tablet (5 mg total) by mouth 3 (three) times daily as needed for muscle spasms. 09/10/20   Jose Persia, MD  DULoxetine (CYMBALTA) 60 MG capsule TAKE 1 CAPSULE(60 MG) BY MOUTH DAILY 07/08/20   Jose Persia, MD  famotidine (PEPCID) 20 MG tablet Take 1 tablet (20 mg total) by mouth daily as needed for heartburn or indigestion. 01/08/20   Jose Persia, MD  fluticasone (FLONASE) 50 MCG/ACT nasal spray Place 1 spray into both nostrils daily. Patient taking differently: Place 1 spray into both nostrils daily as needed for allergies.  03/22/18 03/22/19  Alphonzo Grieve, MD  HYDROcodone-acetaminophen (NORCO) 5-325 MG tablet Take 1 tablet by mouth every 8 (eight) hours as needed for moderate pain or severe pain. 01/28/21   Jose Persia, MD  losartan (COZAAR) 50 MG tablet Take 1 tablet (50 mg total) by mouth daily. 10/22/20   Madalyn Rob, MD  norethindrone (MICRONOR) 0.35 MG tablet Take 1 tablet (0.35 mg total) by mouth daily. 07/22/20   Caren Macadam, MD  traZODone (DESYREL) 50 MG tablet Take 1 tablet (50 mg total) by mouth at bedtime. 12/14/20   Jose Persia, MD  triamcinolone cream (KENALOG) 0.5 % Apply 1 application topically 2 (two) times daily. 05/06/20   Sanjuan Dame, MD    Allergies    Mobic [meloxicam], Penicillins, and Nicotine  Review of Systems   Review of Systems  Constitutional:  Negative for chills and fever.  HENT:  Negative for ear pain and sore throat.   Eyes:  Negative for pain and visual disturbance.  Respiratory:  Positive for cough. Negative for shortness of breath.    Cardiovascular:  Positive for palpitations. Negative for chest pain.  Gastrointestinal:  Negative for abdominal pain and vomiting.  Genitourinary:  Negative for dysuria and hematuria.  Skin:  Negative for color change and rash.  Neurological:  Negative for seizures and syncope.  All other systems reviewed and are negative.  Physical Exam Updated Vital Signs BP 127/85 (BP Location: Right Arm)   Pulse 89   Temp 98 F (36.7 C) (Oral)   Resp (!) 21   Ht 5\' 3"  (1.6 m)   Wt  54.4 kg   LMP 01/17/2021   SpO2 100%   BMI 21.24 kg/m   Physical Exam Vitals and nursing note reviewed. Exam conducted with a chaperone present.  Constitutional:      Appearance: Normal appearance.  HENT:     Head: Normocephalic and atraumatic.  Eyes:     General: No scleral icterus.       Right eye: No discharge.        Left eye: No discharge.     Extraocular Movements: Extraocular movements intact.     Pupils: Pupils are equal, round, and reactive to light.  Cardiovascular:     Rate and Rhythm: Normal rate and regular rhythm.     Pulses: Normal pulses.     Heart sounds: Normal heart sounds. No murmur heard.   No friction rub. No gallop.     Comments: Regular heart rate, radial pulse 2+ and equal bilaterally.  No murmurs auscultated Pulmonary:     Effort: Pulmonary effort is normal. No respiratory distress.     Breath sounds: Normal breath sounds.     Comments: Lungs are clear to auscultation without any wheezing Abdominal:     General: Abdomen is flat. Bowel sounds are normal. There is no distension.     Palpations: Abdomen is soft.     Tenderness: There is no abdominal tenderness.  Skin:    General: Skin is warm and dry.     Coloration: Skin is not jaundiced.  Neurological:     Mental Status: She is alert. Mental status is at baseline.     Coordination: Coordination normal.    ED Results / Procedures / Treatments   Labs (all labs ordered are listed, but only abnormal results are  displayed) Labs Reviewed  RAPID URINE DRUG SCREEN, HOSP PERFORMED  PREGNANCY, URINE    EKG EKG Interpretation  Date/Time:  Monday February 10 2021 11:48:54 EST Ventricular Rate:  97 PR Interval:  128 QRS Duration: 72 QT Interval:  332 QTC Calculation: 421 R Axis:   78 Text Interpretation: Normal sinus rhythm Right atrial enlargement Borderline ECG Confirmed by Fredia Sorrow [phone]) on 02/10/2021 1:21:23 PM  Radiology No results found.  Procedures Procedures   Medications Ordered in ED Medications - No data to display  ED Course  I have reviewed the triage vital signs and the nursing notes.  Pertinent labs & imaging results that were available during my care of the patient were reviewed by me and considered in my medical decision making (see chart for details).    MDM Rules/Calculators/A&P                           Stable vitals, no tachycardia or hypoxia.  No tachypnea or signs of respiratory distress.  Stable blood pressure, no fever.  Chart review performed, patient had a very thorough work-up 3 days ago in the emergency department.  Those results were reviewed, she had negative delta troponins without any arrhythmia on the monitor.  TSH was within normal limits.  Negative dimer, no gross electrolyte derangement or leukocytosis or anemia.  Chest x-ray without any findings of pneumonia.  EKG is without any signs of arrhythmia.  Heart rate is stable, no tachycardia.  UA performed in triage, negative for signs of infection.  Patient still not having any chest pain, given her most recent work-up I do not think her symptoms are consistent with ACS, dissection, PNA, PE, metabolic crisis   She is having palpitations  so could be having an underlying arrhythmia. I do not think she needs additional work-up at this time, do think she would benefit from outpatient cardiology follow-up and cardiac monitoring.  Advised to follow-up with her PCP this week, will also give cardiology  referral.  Patient discharged in stable condition.  Care discussed with my attending Dr. Rogene Houston who agrees with my plan. He did personally review the chart history but did not personally evaluate the patient.   Final Clinical Impression(s) / ED Diagnoses Final diagnoses:  None    Rx / DC Orders ED Discharge Orders     None        Sherrill Raring, PA-C 02/10/21 1418    Fredia Sorrow, MD 02/13/21 [phone]

## 2021-02-11 ENCOUNTER — Telehealth: Payer: Self-pay

## 2021-02-11 NOTE — Telephone Encounter (Signed)
Transition Care Management Follow-up Telephone Call Date of discharge and from where: 02/10/2021 from [facility].  How have you been since you were released from the hospital? Pt stated that she is feeling better and did not have any question or concerns at this time.  Any questions or concerns? No  Items Reviewed: Did the pt receive and understand the discharge instructions provided? Yes  Medications obtained and verified? Yes  Other? No  Any new allergies since your discharge? No  Dietary orders reviewed? No Do you have support at home? Yes   Functional Questionnaire: (I = Independent and D = Dependent) ADLs: I  Bathing/Dressing- I  Meal Prep- I  Eating- I  Maintaining continence- I  Transferring/Ambulation- I  Managing Meds- I   Follow up appointments reviewed:  PCP Hospital f/u appt confirmed? No   Specialist Hospital f/u appt confirmed? Yes  Scheduled to see Phineas Inches, MD on 03/07/2021 @ 2:00pm. Are transportation arrangements needed? No  If their condition worsens, is the pt aware to call PCP or go to the Emergency Dept.? Yes Was the patient provided with contact information for the PCP's office or ED? Yes Was to pt encouraged to call back with questions or concerns? Yes

## 2021-03-07 ENCOUNTER — Other Ambulatory Visit: Payer: Self-pay

## 2021-03-07 ENCOUNTER — Encounter: Payer: Self-pay | Admitting: Internal Medicine

## 2021-03-07 ENCOUNTER — Ambulatory Visit (INDEPENDENT_AMBULATORY_CARE_PROVIDER_SITE_OTHER): Payer: Medicaid Other | Admitting: Internal Medicine

## 2021-03-07 VITALS — BP 116/58 | HR 86 | Ht 64.0 in | Wt 122.8 lb

## 2021-03-07 DIAGNOSIS — R002 Palpitations: Secondary | ICD-10-CM | POA: Diagnosis not present

## 2021-03-07 NOTE — Patient Instructions (Signed)

## 2021-03-07 NOTE — Progress Notes (Signed)
Cardiology Office Note:    Date:  03/07/2021   ID:  Melissa Walker, DOB November 11, 1980, MRN [id number]  PCP:  Jose Persia, MD   [facility] Providers Cardiologist:  Janina Mayo, MD     Referring MD: Jose Persia, MD   No chief complaint on file. Palpitations, post ED visit   History of Present Illness:    Melissa Walker is a 41 y.o. female with a hx of anxiety, HTN, smoker, 6 cm posterior pelvic cystic mass,  who was referred for an ED follow up visit for palpitations.  She went to the ED 02/10/2021 and 12/14. She reported intermittent palpitations. She had some chest tightness prior to this visit. She noted 5 minutes of palpitations the night before. It felt pounding and coming out of her chest. She has no cardiac disease hx. Her TSH was normal. Her EKG was normal sinus rhythm. She said she was given fentanyl which relaxed her. She has no syncopal episodes. She denies LH or dizziness. She drinks 2L mountain dew. She is currently smoking 1/2 ppd. Her mother had MI in her 61s and MGM heart attack. No drug use. No medications that can increase heart rate.   She had a tachycardia work up in 2019 . EKG 04/01/2017 showed sinus tachycardia. EKG 05/16/2018 shows normal sinus rhythm. No evidence of pre-excitation. She was started on atenolol.   02/12/2021 EKG NSR Qtc 421 ms  Past Medical History:  Diagnosis Date   Abnormal uterine bleeding 02/04/2012   Occurred 01/12/12-01/15/12. Evaluated in MAU and all labs, exam, and pelvic u/s WNL. Given rx for ibuprofen and told to follow up as otupatient.     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   PUD (peptic  ulcer disease) 07/13/2018   Tobacco abuse    Underweight 10/26/2014    Past Surgical History:  Procedure Laterality Date   DENTAL REHABILITATION     full set dentures   LUMBAR LAMINECTOMY/DECOMPRESSION MICRODISCECTOMY Right 07/23/2014   Procedure: Right Lumbar five-Sacral one laminotomy and microdiskectomy;  Surgeon: Jovita Gamma, MD;  Location: [facility];  Service: Neurosurgery;  Laterality: Right;  Right Lumbar five-Sacral one laminotomy and microdiskectomy   Multiple epidural steroid injections in back     Over past 2 years   POSTERIOR LAMINECTOMY / DECOMPRESSION LUMBAR SPINE  06/23/2018   L5-S1 decompression    TUBAL LIGATION Bilateral 05/11/2013   Procedure: POST PARTUM TUBAL LIGATION;  Surgeon: Osborne Oman, MD;  Location: [facility];  Service: Gynecology;  Laterality: Bilateral;    Current Medications: Current Meds  Medication Sig   atenolol (TENORMIN) 25 MG tablet TAKE 1/2 TABLET(12.5 MG) BY MOUTH DAILY   cyclobenzaprine (FLEXERIL) 5 MG tablet Take 1 tablet (5 mg total) by mouth 3 (three) times daily as needed for muscle spasms.   DULoxetine (CYMBALTA) 60 MG capsule TAKE 1 CAPSULE(60 MG) BY MOUTH DAILY   HYDROcodone-acetaminophen (NORCO) 5-325 MG tablet Take 1 tablet by mouth every 8 (eight) hours as needed for moderate pain or severe pain.   losartan (COZAAR) 50 MG tablet Take 1 tablet (50 mg total) by mouth daily.  norethindrone (MICRONOR) 0.35 MG tablet Take 1 tablet (0.35 mg total) by mouth daily.   traZODone (DESYREL) 50 MG tablet Take 1 tablet (50 mg total) by mouth at bedtime.     Allergies:   Mobic [meloxicam], Penicillins, and Nicotine   Social History   Socioeconomic History   Marital status: Single    Spouse name: Not on file   Number of children: Not on file   Years of education: Not on file   Highest education level: Not on file  Occupational History   Not on file  Tobacco Use   Smoking status: Every Day    Packs/day: 0.50    Years: 18.00    Pack  years: 9.00    Types: Cigarettes   Smokeless tobacco: Never  Vaping Use   Vaping Use: Never used  Substance and Sexual Activity   Alcohol use: Yes    Alcohol/week: 0.0 standard drinks    Comment: occasional   Drug use: No   Sexual activity: Yes    Birth control/protection: Condom, Surgical  Other Topics Concern   Not on file  Social History Narrative   Not on file   Social Determinants of Health   Financial Resource Strain: Not on file  Food Insecurity: No Food Insecurity   Worried About Running Out of Food in the Last Year: Never true   Ran Out of Food in the Last Year: Never true  Transportation Needs: No Transportation Needs   Lack of Transportation (Medical): No   Lack of Transportation (Non-Medical): No  Physical Activity: Not on file  Stress: Not on file  Social Connections: Not on file     Family History: The patient's family history includes Cancer in her father and maternal grandmother; Eclampsia in her maternal grandmother; Heart attack in her maternal grandmother; Heart disease in her maternal grandmother; Hyperlipidemia in her maternal grandmother; Hypertension in her maternal grandmother.  ROS:   Please see the history of present illness.     All other systems reviewed and are negative.  EKGs/Labs/Other Studies Reviewed:    The following studies were reviewed today:   EKG:  EKG is  ordered today.  The ekg ordered today demonstrates   NSR, PR 148 ms, Qtc 428 ms  Recent Labs: 02/07/2021: BUN 6; Creatinine, Ser 0.94; Hemoglobin 15.8; Platelets 425; Potassium 3.9; Sodium 136; TSH 1.097  Recent Lipid Panel    Component Value Date/Time   CHOL 180 09/14/2008 1450   TRIG 133 09/14/2008 1450   HDL 38 (L) 09/14/2008 1450   CHOLHDL 4.7 Ratio 09/14/2008 1450   VLDL 27 09/14/2008 1450   LDLCALC 115 (H) 09/14/2008 1450     Risk Assessment/Calculations:           Physical Exam:    VS:  BP (!) 116/58 (BP Location: Left Arm, Patient Position: Sitting,  Cuff Size: Normal)    Pulse 86    Ht 5\' 4"  (1.626 m)    Wt 122 lb 12.8 oz (55.7 kg)    BMI 21.08 kg/m     Wt Readings from Last 3 Encounters:  03/07/21 122 lb 12.8 oz (55.7 kg)  02/10/21 119 lb 14.9 oz (54.4 kg)  02/07/21 120 lb (54.4 kg)     GEN:  Well nourished, well developed in no acute distress HEENT: Normal NECK: No JVD; No carotid bruits LYMPHATICS: No lymphadenopathy CARDIAC: RRR, no murmurs, rubs, gallops RESPIRATORY:  Clear to auscultation without rales, wheezing or rhonchi  ABDOMEN: Soft, non-tender, non-distended MUSCULOSKELETAL:  No  edema; No deformity  SKIN: Warm and dry NEUROLOGIC:  Alert and oriented x 3 PSYCHIATRIC:  Normal affect   ASSESSMENT:    #Palpitations: She has mild symptoms. She does not have high risk features including syncope c/f arrhythmia , family hx of SCD, or abnormalities on her EKG. We discussed that she has significant caffeine intake and she can cut back on this. We discussed reducing to a cup or 2 a day. We also discussed the importance of smoking cessation. She can continue the atenolol.  #HTN: well controlled. Continue current regimen atenolol 12.5 mg daily,losartan 50 mg daily.   PLAN:    In order of problems listed above:  No changes Follow up prn           Medication Adjustments/Labs and Tests Ordered: Current medicines are reviewed at length with the patient today.  Concerns regarding medicines are outlined above.  Orders Placed This Encounter  Procedures   EKG 12-Lead   No orders of the defined types were placed in this encounter.   Patient Instructions  Medication Instructions:  No Changes In Medications at this time.  *If you need a refill on your cardiac medications before your next appointment, please call your pharmacy*  Follow-Up: At [facility], you and your health needs are our priority.  As part of our continuing mission to provide you with exceptional heart care, we have created designated Provider Care  Teams.  These Care Teams include your primary Cardiologist (physician) and Advanced Practice Providers (APPs -  Physician Assistants and Nurse Practitioners) who all work together to provide you with the care you need, when you need it.  Your next appointment:   AS NEEDED   The format for your next appointment:   In Person  Provider:   Janina Mayo, MD      Signed, Janina Mayo, MD  03/07/2021 2:24 PM    [place]

## 2021-03-18 ENCOUNTER — Other Ambulatory Visit: Payer: Self-pay

## 2021-03-18 DIAGNOSIS — I10 Essential (primary) hypertension: Secondary | ICD-10-CM

## 2021-03-18 DIAGNOSIS — M5417 Radiculopathy, lumbosacral region: Secondary | ICD-10-CM

## 2021-03-18 MED ORDER — DULOXETINE HCL 60 MG PO CPEP: ORAL_CAPSULE | ORAL | 1 refills | Status: DC

## 2021-03-18 MED ORDER — ATENOLOL 25 MG PO TABS: ORAL_TABLET | ORAL | 1 refills | Status: DC

## 2021-03-21 ENCOUNTER — Ambulatory Visit: Payer: Medicaid Other | Admitting: Internal Medicine

## 2021-03-21 VITALS — BP 124/65 | HR 100 | Temp 98.4°F | Wt 119.3 lb

## 2021-03-21 DIAGNOSIS — R21 Rash and other nonspecific skin eruption: Secondary | ICD-10-CM | POA: Diagnosis not present

## 2021-03-21 DIAGNOSIS — I10 Essential (primary) hypertension: Secondary | ICD-10-CM

## 2021-03-21 DIAGNOSIS — Z79891 Long term (current) use of opiate analgesic: Secondary | ICD-10-CM | POA: Diagnosis not present

## 2021-03-21 MED ORDER — HYDROXYZINE HCL 25 MG PO TABS: 25.0000 mg | ORAL_TABLET | Freq: Four times a day (QID) | ORAL | 0 refills | Status: DC | PRN

## 2021-03-21 NOTE — Patient Instructions (Signed)
Dear Melissa Walker,  Today we discussed your rash. We will give you a prescription for Hydroxyzine to take 25mg  4 times daily as needed for itching. We have also placed a referral to dermatology.  We updated your pain contract today and do not need to collect a urine sample.   Your blood pressure is controlled, please continue taking the Losartan daily.

## 2021-03-21 NOTE — Progress Notes (Signed)
° °  CC: check up and med refill  HPI:[name] is a 41 y.o. female who presents for evaluation of check up and med refill. Please see individual problem based A/P for details.  Depression, PHQ-9: Based on the patients  Dante Visit from 07/22/2020 in [place] for [employer] at [facility]  PHQ-9 Total Score 0      score we have 0.  Past Medical History:  Diagnosis Date   Abnormal uterine bleeding 02/04/2012   Occurred 01/12/12-01/15/12. Evaluated in [facility]U and all labs, exam, and pelvic u/s WNL. Given rx for ibuprofen and told to follow up as otupatient.     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   PUD (peptic ulcer disease) 07/13/2018   Tobacco abuse    Underweight 10/26/2014   Review of Systems:   Review of Systems  Constitutional: Negative.   HENT: Negative.    Eyes: Negative.   Respiratory: Negative.    Cardiovascular: Negative.   Gastrointestinal: Negative.   Genitourinary: Negative.   Musculoskeletal: Negative.   Skin:  Positive for itching and rash.  Neurological: Negative.   Endo/Heme/Allergies: Negative.   Psychiatric/Behavioral: Negative.      Physical Exam: There were no vitals filed for this visit.   General: alert and oriented HEENT: Conjunctiva nl , antiicteric sclerae, moist mucous membranes, no exudate or erythema Cardiovascular: Normal rate, regular rhythm.  No murmurs, rubs, or gallops Pulmonary : Equal breath sounds, No wheezes, rales, or rhonchi Abdominal: soft, nontender,  bowel sounds present Ext: No edema in lower extremities, no tenderness to palpation of lower extremities.   Skin: vesiculopapular rash over back and chest with excoriations and evidence of previous scarring.   Assessment & Plan:   See Encounters Tab for problem based charting.  Patient discussed with Dr. Dareen Piano

## 2021-03-23 ENCOUNTER — Encounter: Payer: Self-pay | Admitting: Internal Medicine

## 2021-03-23 NOTE — Assessment & Plan Note (Signed)
Pt reports rash has persisted despite stopping HCTZ, antihistimes, and hydrocortisone cream. Rash now involving both chest and back. She denies any pustular lesions. No new sexual partners, insect bites or travel. No fever, chills, or joint pain. Denies NVD. Given prescription for hydroxyzine to help with itching and referral to derm placed.

## 2021-03-23 NOTE — Assessment & Plan Note (Signed)
Compliant with medications. No adverse effects.  Blood pressure controlled.  Continue Losartan 50mg .

## 2021-03-23 NOTE — Assessment & Plan Note (Signed)
Patient reports compliance with current regimen with adequate control. UDS in ED appropriate.  Pain contract updated.

## 2021-03-24 NOTE — Progress Notes (Signed)
Internal Medicine Clinic Attending ° °Case discussed with Dr. Gawaluck  At the time of the visit.  We reviewed the resident’s history and exam and pertinent patient test results.  I agree with the assessment, diagnosis, and plan of care documented in the resident’s note.  °

## 2021-03-25 ENCOUNTER — Other Ambulatory Visit: Payer: Self-pay

## 2021-03-25 DIAGNOSIS — M5417 Radiculopathy, lumbosacral region: Secondary | ICD-10-CM

## 2021-03-25 MED ORDER — CYCLOBENZAPRINE HCL 5 MG PO TABS: 5.0000 mg | ORAL_TABLET | Freq: Three times a day (TID) | ORAL | 2 refills | Status: DC | PRN

## 2021-03-31 ENCOUNTER — Other Ambulatory Visit: Payer: Self-pay

## 2021-03-31 DIAGNOSIS — Z79891 Long term (current) use of opiate analgesic: Secondary | ICD-10-CM

## 2021-03-31 NOTE — Telephone Encounter (Signed)
HYDROcodone-acetaminophen (NORCO) 5-325 MG tablet, refill request @ [facility] [phone] - [place], [place].  Pt states she need this pain medication by today. Requesting to speak with a nurse.

## 2021-03-31 NOTE — Telephone Encounter (Signed)
Last rx written  01/28/21. Last OV 03/21/21 w/Dr Elliot Gurney. Next OV has not been scheduled. UDS 02/10/21.

## 2021-04-02 MED ORDER — HYDROCODONE-ACETAMINOPHEN 5-325 MG PO TABS: 1.0000 | ORAL_TABLET | Freq: Three times a day (TID) | ORAL | 0 refills | Status: DC | PRN

## 2021-04-03 ENCOUNTER — Telehealth: Payer: Self-pay

## 2021-04-03 NOTE — Telephone Encounter (Signed)
PA for pt ( HYDROCODONE- ACETAMINOPHEN 5- 325 MG TAB )   Came through on cover my meds was submitted with office notes and UDA .. awaiting approval or denial

## 2021-04-04 NOTE — Telephone Encounter (Signed)
DECISION :     Approved on February 2  Request Reference Number: [id number].   HYDROCO/APAP TAB 5-325MG  is approved through 10/01/2021.     For further questions, call [employer] at [phone].     ( [place] )

## 2021-04-15 ENCOUNTER — Other Ambulatory Visit: Payer: Self-pay | Admitting: Internal Medicine

## 2021-04-17 ENCOUNTER — Other Ambulatory Visit: Payer: Self-pay

## 2021-04-17 DIAGNOSIS — I10 Essential (primary) hypertension: Secondary | ICD-10-CM

## 2021-04-17 MED ORDER — LOSARTAN POTASSIUM 50 MG PO TABS: 50.0000 mg | ORAL_TABLET | Freq: Every day | ORAL | 1 refills | Status: DC

## 2021-05-02 ENCOUNTER — Encounter: Payer: Self-pay | Admitting: Internal Medicine

## 2021-05-02 DIAGNOSIS — Z79891 Long term (current) use of opiate analgesic: Secondary | ICD-10-CM

## 2021-05-02 MED ORDER — HYDROCODONE-ACETAMINOPHEN 5-325 MG PO TABS: 1.0000 | ORAL_TABLET | Freq: Three times a day (TID) | ORAL | 0 refills | Status: DC | PRN

## 2021-05-02 NOTE — Telephone Encounter (Signed)
Last [facility]ox[facility] 01/08/2020.  Last visit 03/21/2021 . ?

## 2021-05-15 ENCOUNTER — Encounter: Payer: Self-pay | Admitting: Internal Medicine

## 2021-05-15 ENCOUNTER — Other Ambulatory Visit: Payer: Self-pay | Admitting: Internal Medicine

## 2021-05-15 ENCOUNTER — Other Ambulatory Visit: Payer: Self-pay

## 2021-05-15 MED ORDER — TRAZODONE HCL 50 MG PO TABS
50.0000 mg | ORAL_TABLET | Freq: Every day | ORAL | 1 refills | Status: DC
Start: 2021-05-15 — End: 2022-02-24

## 2021-05-15 MED ORDER — HYDROXYZINE HCL 25 MG PO TABS: 25.0000 mg | ORAL_TABLET | Freq: Three times a day (TID) | ORAL | 0 refills | Status: DC | PRN

## 2021-05-27 ENCOUNTER — Other Ambulatory Visit (HOSPITAL_BASED_OUTPATIENT_CLINIC_OR_DEPARTMENT_OTHER): Payer: Self-pay | Admitting: Family Medicine

## 2021-05-27 DIAGNOSIS — Z7689 Persons encountering health services in other specified circumstances: Secondary | ICD-10-CM | POA: Diagnosis not present

## 2021-05-27 DIAGNOSIS — Z13220 Encounter for screening for lipoid disorders: Secondary | ICD-10-CM | POA: Diagnosis not present

## 2021-05-27 DIAGNOSIS — J449 Chronic obstructive pulmonary disease, unspecified: Secondary | ICD-10-CM | POA: Diagnosis not present

## 2021-05-27 DIAGNOSIS — L089 Local infection of the skin and subcutaneous tissue, unspecified: Secondary | ICD-10-CM | POA: Diagnosis not present

## 2021-05-28 ENCOUNTER — Other Ambulatory Visit (HOSPITAL_BASED_OUTPATIENT_CLINIC_OR_DEPARTMENT_OTHER): Payer: Self-pay | Admitting: Family Medicine

## 2021-05-28 ENCOUNTER — Ambulatory Visit (HOSPITAL_BASED_OUTPATIENT_CLINIC_OR_DEPARTMENT_OTHER)
Admission: RE | Admit: 2021-05-28 | Discharge: 2021-05-28 | Disposition: A | Discharge status: Home / Self Care | Payer: Medicaid Other | Source: Ambulatory Visit | Attending: Family Medicine | Admitting: Family Medicine

## 2021-05-28 DIAGNOSIS — R053 Chronic cough: Secondary | ICD-10-CM

## 2021-06-02 DIAGNOSIS — S2090XA Unspecified superficial injury of unspecified parts of thorax, initial encounter: Secondary | ICD-10-CM | POA: Diagnosis not present

## 2021-06-02 DIAGNOSIS — L299 Pruritus, unspecified: Secondary | ICD-10-CM | POA: Diagnosis not present

## 2021-06-25 ENCOUNTER — Other Ambulatory Visit: Payer: Self-pay | Admitting: Family Medicine

## 2021-06-25 DIAGNOSIS — N926 Irregular menstruation, unspecified: Secondary | ICD-10-CM

## 2021-09-29 ENCOUNTER — Other Ambulatory Visit: Payer: Self-pay

## 2021-09-29 DIAGNOSIS — I10 Essential (primary) hypertension: Secondary | ICD-10-CM

## 2021-09-29 MED ORDER — LOSARTAN POTASSIUM 50 MG PO TABS: 50.0000 mg | ORAL_TABLET | Freq: Every day | ORAL | 3 refills | Status: DC

## 2021-10-06 DIAGNOSIS — M961 Postlaminectomy syndrome, not elsewhere classified: Secondary | ICD-10-CM | POA: Insufficient documentation

## 2021-12-02 ENCOUNTER — Other Ambulatory Visit: Payer: Self-pay

## 2021-12-02 DIAGNOSIS — M5417 Radiculopathy, lumbosacral region: Secondary | ICD-10-CM

## 2021-12-02 MED ORDER — CYCLOBENZAPRINE HCL 5 MG PO TABS: 5.0000 mg | ORAL_TABLET | Freq: Three times a day (TID) | ORAL | 2 refills | Status: DC | PRN

## 2022-02-24 ENCOUNTER — Other Ambulatory Visit: Payer: Self-pay

## 2022-02-24 MED ORDER — TRAZODONE HCL 100 MG PO TABS: 100.0000 mg | ORAL_TABLET | Freq: Every day | ORAL | 0 refills | Status: DC

## 2022-03-18 ENCOUNTER — Other Ambulatory Visit: Payer: Self-pay

## 2022-03-18 DIAGNOSIS — N926 Irregular menstruation, unspecified: Secondary | ICD-10-CM

## 2022-03-18 MED ORDER — TRAZODONE HCL 100 MG PO TABS: 100.0000 mg | ORAL_TABLET | Freq: Every day | ORAL | 0 refills | Status: DC

## 2022-03-31 ENCOUNTER — Ambulatory Visit: Payer: Medicaid Other | Admitting: Internal Medicine

## 2022-03-31 ENCOUNTER — Encounter: Payer: Self-pay | Admitting: Internal Medicine

## 2022-03-31 VITALS — BP 110/70 | HR 79 | Temp 98.1°F | Resp 18 | Ht 63.0 in | Wt 129.0 lb

## 2022-03-31 DIAGNOSIS — F419 Anxiety disorder, unspecified: Secondary | ICD-10-CM

## 2022-03-31 DIAGNOSIS — R002 Palpitations: Secondary | ICD-10-CM

## 2022-03-31 DIAGNOSIS — G4700 Insomnia, unspecified: Secondary | ICD-10-CM | POA: Diagnosis not present

## 2022-03-31 DIAGNOSIS — I10 Essential (primary) hypertension: Secondary | ICD-10-CM

## 2022-03-31 DIAGNOSIS — F32A Depression, unspecified: Secondary | ICD-10-CM

## 2022-03-31 DIAGNOSIS — M5417 Radiculopathy, lumbosacral region: Secondary | ICD-10-CM

## 2022-03-31 MED ORDER — ATENOLOL 25 MG PO TABS: 25.0000 mg | ORAL_TABLET | Freq: Every day | ORAL | 6 refills | Status: AC

## 2022-03-31 MED ORDER — LOSARTAN POTASSIUM 50 MG PO TABS: 50.0000 mg | ORAL_TABLET | Freq: Every day | ORAL | 3 refills | Status: AC

## 2022-03-31 MED ORDER — CYCLOBENZAPRINE HCL 7.5 MG PO TABS: 7.5000 mg | ORAL_TABLET | Freq: Two times a day (BID) | ORAL | 3 refills | Status: DC | PRN

## 2022-03-31 NOTE — Assessment & Plan Note (Signed)
Continue with current treatment

## 2022-03-31 NOTE — Assessment & Plan Note (Signed)
Will increase the dose of atenolol.

## 2022-03-31 NOTE — Assessment & Plan Note (Signed)
Continue with trazodone and sleep hygiene was discussed with her.

## 2022-03-31 NOTE — Assessment & Plan Note (Signed)
Continue with current dose of duloxetine.

## 2022-03-31 NOTE — Progress Notes (Signed)
Office Visit  Subjective   Patient ID: Melissa Walker   DOB: 09-15-1980   Age: 42 y.o.   MRN: [id number]   Chief Complaint Chief Complaint  Patient presents with   Medication Refill    Hypertension,Peptic Ulcer, GERD     History of Present Illness 42 years old female who is here for follow up and refill of her pain medications. She occasionally check her blood pressure at home, she take atenolol and losartan daily. He says she feel palpitations and fast heart rate, her heart rate can go up to190, last night she has left side chest pressure, it lasted for few hours, it get better with deep  breath and no relation with exertion, she saw cardiologist for the same pain and her heart was ok per patient.    Past Medical History Past Medical History:  Diagnosis Date   Abnormal uterine bleeding 02/04/2012   Occurred 01/12/12-01/15/12. Evaluated in [facility] and all labs, exam, and pelvic u/s WNL. Given rx for ibuprofen and told to follow up as otupatient.     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   PUD (peptic ulcer disease) 07/13/2018   Tobacco abuse    Underweight 10/26/2014     Allergies Allergies  Allergen Reactions   Mobic [Meloxicam] Other (See Comments)    Stomach bleeding    Penicillins Other (See Comments)    uncle highly allergic so was told whole family should not take it Did it involve swelling of the face/tongue/throat, SOB, or low BP? Unknown Did it involve sudden or severe rash/hives, skin peeling, or any reaction on the inside of your mouth or nose? Unknown Did you need to seek medical attention at a hospital or doctor's office? Unknown When did it  last happen?      Never taken before If all above answers are "NO", may proceed with cephalosporin use.  Unknown, patient has never taken the medication but states her uncle has an allergy to it.    Nicotine Rash    Transdermal Patch     Review of Systems Review of Systems  Constitutional: Negative.   HENT: Negative.    Respiratory: Negative.    Cardiovascular:  Positive for chest pain and palpitations.  Gastrointestinal: Negative.   Neurological: Negative.        Objective:    Vitals BP 110/70 (BP Location: Right Arm, Patient Position: Sitting, Cuff Size: Normal)   Pulse 79   Temp 98.1 F (36.7 C)   Resp 18   Ht '5\' 3"'$  (1.6 m)   Wt 129 lb (58.5 kg)   SpO2 99%   BMI 22.85 kg/m    Physical Examination Physical Exam Constitutional:      Appearance: She is normal weight.  HENT:     Head: Normocephalic and atraumatic.  Cardiovascular:     Rate and Rhythm: Normal rate and regular rhythm.     Heart sounds: Normal heart sounds.  Pulmonary:     Effort: Pulmonary effort is normal.     Breath sounds: Normal breath sounds.  Abdominal:     General: Bowel sounds are normal.  Palpations: Abdomen is soft.  Neurological:     Mental Status: She is alert.        Assessment & Plan:   Hypertension Continue with current treatment.  Anxiety and depression Continue with current dose of duloxetine.  Insomnia Continue with trazodone and sleep hygiene was discussed with her.  Palpitations Will increase the dose of atenolol.    Return in about 3 months (around 06/30/2022).   Garwin Brothers, MD

## 2022-04-01 ENCOUNTER — Emergency Department (HOSPITAL_BASED_OUTPATIENT_CLINIC_OR_DEPARTMENT_OTHER)
Admission: EM | Admit: 2022-04-01 | Discharge: 2022-04-01 | Disposition: A | Discharge status: Home / Self Care | Payer: Medicaid Other | Attending: Emergency Medicine | Admitting: Emergency Medicine

## 2022-04-01 ENCOUNTER — Other Ambulatory Visit: Payer: Self-pay

## 2022-04-01 ENCOUNTER — Encounter (HOSPITAL_BASED_OUTPATIENT_CLINIC_OR_DEPARTMENT_OTHER): Payer: Self-pay

## 2022-04-01 ENCOUNTER — Emergency Department (HOSPITAL_BASED_OUTPATIENT_CLINIC_OR_DEPARTMENT_OTHER): Payer: Medicaid Other

## 2022-04-01 DIAGNOSIS — J449 Chronic obstructive pulmonary disease, unspecified: Secondary | ICD-10-CM | POA: Insufficient documentation

## 2022-04-01 DIAGNOSIS — F17200 Nicotine dependence, unspecified, uncomplicated: Secondary | ICD-10-CM | POA: Diagnosis not present

## 2022-04-01 DIAGNOSIS — Z7951 Long term (current) use of inhaled steroids: Secondary | ICD-10-CM | POA: Insufficient documentation

## 2022-04-01 DIAGNOSIS — R079 Chest pain, unspecified: Secondary | ICD-10-CM

## 2022-04-01 DIAGNOSIS — R0789 Other chest pain: Secondary | ICD-10-CM | POA: Diagnosis not present

## 2022-04-01 LAB — COMPREHENSIVE METABOLIC PANEL
ALT: 10 U/L (ref 0–44)
AST: 14 U/L — ABNORMAL LOW (ref 15–41)
Albumin: 3.3 g/dL — ABNORMAL LOW (ref 3.5–5.0)
Alkaline Phosphatase: 47 U/L (ref 38–126)
Anion gap: 5 (ref 5–15)
BUN: 14 mg/dL (ref 6–20)
CO2: 29 mmol/L (ref 22–32)
Calcium: 8.4 mg/dL — ABNORMAL LOW (ref 8.9–10.3)
Chloride: 101 mmol/L (ref 98–111)
Creatinine, Ser: 0.9 mg/dL (ref 0.44–1.00)
GFR, Estimated: >60 mL/min (ref >60)
Glucose, Bld: 83 mg/dL (ref 70–99)
Potassium: 3.4 mmol/L — ABNORMAL LOW (ref 3.5–5.1)
Sodium: 135 mmol/L (ref 135–145)
Total Bilirubin: 0.6 mg/dL (ref 0.3–1.2)
Total Protein: 6.5 g/dL (ref 6.5–8.1)

## 2022-04-01 LAB — CBC
HCT: 33.3 % — ABNORMAL LOW (ref 36.0–46.0)
Hemoglobin: 11.4 g/dL — ABNORMAL LOW (ref 12.0–15.0)
MCH: 35.3 pg — ABNORMAL HIGH (ref 26.0–34.0)
MCHC: 34.2 g/dL (ref 30.0–36.0)
MCV: 103.1 fL — ABNORMAL HIGH (ref 80.0–100.0)
Platelets: 297 10*3/uL (ref 150–400)
RBC: 3.23 MIL/uL — ABNORMAL LOW (ref 3.87–5.11)
RDW: 12.6 % (ref 11.5–15.5)
WBC: 5.1 10*3/uL (ref 4.0–10.5)
nRBC: 0 % (ref 0.0–0.2)

## 2022-04-01 LAB — D-DIMER, QUANTITATIVE: D-Dimer, Quant: 0.38 ug/mL-FEU (ref 0.00–0.50)

## 2022-04-01 LAB — TROPONIN I (HIGH SENSITIVITY): Troponin I (High Sensitivity): <2 ng/L (ref <18)

## 2022-04-01 MED ORDER — IPRATROPIUM-ALBUTEROL 0.5-2.5 (3) MG/3ML IN SOLN
3.0000 mL | Freq: Once | RESPIRATORY_TRACT | Status: AC
  Administered 2022-04-01: 3 mL via RESPIRATORY_TRACT
  Filled 2022-04-01: qty 3

## 2022-04-01 MED ORDER — ACETAMINOPHEN 325 MG PO TABS
650.0000 mg | ORAL_TABLET | Freq: Once | ORAL | Status: AC
  Administered 2022-04-01: 650 mg via ORAL
  Filled 2022-04-01: qty 2

## 2022-04-01 MED ORDER — LIDOCAINE 5 % EX PTCH
1.0000 | MEDICATED_PATCH | CUTANEOUS | Status: DC
  Administered 2022-04-01: 1 via TRANSDERMAL
  Filled 2022-04-01: qty 1

## 2022-04-01 NOTE — ED Triage Notes (Signed)
C/o left sided chest pain x few days, states goes down her left side.

## 2022-04-01 NOTE — ED Provider Notes (Signed)
[facility] Provider Note   CSN: [id number] Arrival date & time: 04/01/22  1032     History  Chief Complaint  Patient presents with   Chest Pain    Melissa Walker is a 42 y.o. female, history of tobacco use, COPD, who presents to the ED secondary to chest pressure for the last few days, with no radiation to the left side.  She just states that it is a 10 out of 10 pain, pressurized, intermittent in nature.  Worse with movement, but states that there is severe pain under her left armpit now.  States the pain is worse when moving, denies any shortness of breath, nausea, vomiting.  Does state that she has had some swelling of her lower legs but that she has been standing a lot.  She told her primary care doctor this, and was told that she should just watch and see how it goes.  Is on chronic opioids for her back pain.  Denies any IV drug use, hormone use, or recent surgeries.  Does typically smoke daily.  History of COPD.     Home Medications Prior to Admission medications   Medication Sig Start Date End Date Taking? Authorizing Provider  atenolol (TENORMIN) 25 MG tablet Take 1 tablet (25 mg total) by mouth daily. 03/31/22   Garwin Brothers, MD  cyclobenzaprine (FEXMID) 7.5 MG tablet Take 1 tablet (7.5 mg total) by mouth 2 (two) times daily between meals as needed for muscle spasms. 03/31/22   Garwin Brothers, MD  DULoxetine (CYMBALTA) 60 MG capsule TAKE 1 CAPSULE(60 MG) BY MOUTH DAILY 03/18/21   Jose Persia, MD  famotidine (PEPCID) 20 MG tablet Take 1 tablet (20 mg total) by mouth daily as needed for heartburn or indigestion. 01/08/20   Jose Persia, MD  fluticasone (FLONASE) 50 MCG/ACT nasal spray Place 1 spray into both nostrils daily. 03/22/18 03/31/22  Alphonzo Grieve, MD  gabapentin (NEURONTIN) 300 MG capsule Take 300 mg by mouth 2 (two) times daily.    [provider]  HYDROcodone-acetaminophen (NORCO) 5-325 MG tablet Take 1 tablet by  mouth every 8 (eight) hours as needed for moderate pain or severe pain. 05/09/21   Jose Persia, MD  hydrOXYzine (ATARAX) 25 MG tablet Take 1 tablet (25 mg total) by mouth every 8 (eight) hours as needed for itching. 05/15/21   Jose Persia, MD  losartan (COZAAR) 50 MG tablet Take 1 tablet (50 mg total) by mouth daily. 03/31/22   Garwin Brothers, MD  norethindrone (MICRONOR) 0.35 MG tablet Take 1 tablet (0.35 mg total) by mouth daily. 07/22/20   Caren Macadam, MD  traZODone (DESYREL) 100 MG tablet Take 1 tablet (100 mg total) by mouth at bedtime. 03/18/22   Garwin Brothers, MD  triamcinolone cream (KENALOG) 0.5 % Apply 1 application topically 2 (two) times daily. 05/06/20   Sanjuan Dame, MD      Allergies    Mobic [meloxicam], Penicillins, and Nicotine    Review of Systems   Review of Systems  Constitutional:  Negative for fever.  Respiratory:  Negative for cough and shortness of breath.   Cardiovascular:  Positive for chest pain.    Physical Exam Updated Vital Signs BP 120/64   Pulse 96   Temp 97.9 F (36.6 C) (Oral)   Resp (!) 23   Ht '5\' 3"'$  (1.6 m)   Wt 58.5 kg   LMP 01/17/2021   SpO2 95%   BMI 22.85 kg/m  Physical Exam  Vitals and nursing note reviewed.  Constitutional:      General: She is not in acute distress.    Appearance: She is well-developed.  HENT:     Head: Normocephalic and atraumatic.  Eyes:     Conjunctiva/sclera: Conjunctivae normal.  Cardiovascular:     Rate and Rhythm: Normal rate and regular rhythm.     Heart sounds: No murmur heard. Pulmonary:     Effort: Pulmonary effort is normal. No respiratory distress.     Breath sounds: Normal breath sounds.  Chest:     Comments: Tenderness to palpation of midline chest wall, to left chest wall. Abdominal:     Palpations: Abdomen is soft.     Tenderness: There is no abdominal tenderness.  Musculoskeletal:        General: No swelling.     Cervical back: Neck supple.  Skin:    General: Skin is warm and  dry.     Capillary Refill: Capillary refill takes less than 2 seconds.  Neurological:     Mental Status: She is alert.  Psychiatric:        Mood and Affect: Mood normal.    ED Results / Procedures / Treatments   Labs (all labs ordered are listed, but only abnormal results are displayed) Labs Reviewed  CBC - Abnormal; Notable for the following components:      Result Value   RBC 3.23 (*)    Hemoglobin 11.4 (*)    HCT 33.3 (*)    MCV 103.1 (*)    MCH 35.3 (*)    All other components within normal limits  COMPREHENSIVE METABOLIC PANEL - Abnormal; Notable for the following components:   Potassium 3.4 (*)    Calcium 8.4 (*)    Albumin 3.3 (*)    AST 14 (*)    All other components within normal limits  D-DIMER, QUANTITATIVE  PREGNANCY, URINE  TROPONIN I (HIGH SENSITIVITY)  TROPONIN I (HIGH SENSITIVITY)    EKG EKG Interpretation  Date/Time:  Wednesday April 01 2022 10:38:24 EST Ventricular Rate:  84 PR Interval:  147 QRS Duration: 82 QT Interval:  357 QTC Calculation: 422 R Axis:   66 Text Interpretation: Sinus rhythm Confirmed by Aletta Edouard [phone]) on 04/01/2022 10:43:04 AM  Radiology DG Chest 2 View  Result Date: 04/01/2022 CLINICAL DATA:  Chest pain for 2-3 days. EXAM: CHEST - 2 VIEW COMPARISON:  PA and lateral chest 05/28/2021. FINDINGS: Lungs clear. Heart size normal. No pneumothorax or pleural fluid. No acute or focal bony abnormality. IMPRESSION: Negative chest. Electronically Signed   By: Inge Rise M.D.   On: 04/01/2022 10:50    Procedures Procedures    Medications Ordered in ED Medications  lidocaine (LIDODERM) 5 % 1 patch (1 patch Transdermal Patch Applied 04/01/22 1119)  acetaminophen (TYLENOL) tablet 650 mg (650 mg Oral Given 04/01/22 1117)  ipratropium-albuterol (DUONEB) 0.5-2.5 (3) MG/3ML nebulizer solution 3 mL (3 mLs Nebulization Given 04/01/22 1244)    ED Course/ Medical Decision Making/ A&P           HEART Score: 1                 Medical Decision Making Patient is a 42 year old female, here for chest pain for the last couple days, she does have tenderness chest wall, however given her age smoking history, we will obtain D-dimer, troponins, chest x-ray, for further evaluation and started on lidocaine, and Tylenol.  Amount and/or Complexity of Data Reviewed Labs: ordered.  Details: Troponin within normal limits, D-dimer less than 0.5 Radiology: ordered.    Details: Cxr clear Discussion of management or test interpretation with external provider(s): Discussed with patient etiology of chest pain unclear at this time, however troponins within normal limits D-dimer less than 0.5 I am wondering if this is possibly related to anxiety/possibly musculoskeletal.  She did have very little relief with her lidocaine patch, thus that I think it is likely secondary to anxiety, we Hu but we discussed return precautions and importance of follow-up with PCP.  Risk OTC drugs. Prescription drug management.    Final Clinical Impression(s) / ED Diagnoses Final diagnoses:  Chest pain, unspecified type    Rx / DC Orders ED Discharge Orders     None         Diamantina Monks, Si Gaul, PA 04/01/22 1859    Hayden Rasmussen, MD 04/03/22 1040

## 2022-04-01 NOTE — ED Notes (Signed)
Patient transported to X-ray 

## 2022-04-01 NOTE — Discharge Instructions (Signed)
Today it appears that your chest pain is likely not cardiac, but please follow-up with your primary care doctor.  If your chest pain is getting worse, you have increased shortness of breath please return to the ER.

## 2022-04-01 NOTE — ED Notes (Signed)
Discharge paperwork reviewed entirely with patient, including Rx's and follow up care. Pain was under control. Pt verbalized understanding as well as all parties involved. No questions or concerns voiced at the time of discharge. No acute distress noted.   Pt ambulated out to PVA without incident or assistance.

## 2022-04-09 ENCOUNTER — Encounter: Payer: Self-pay | Admitting: Internal Medicine

## 2022-04-09 ENCOUNTER — Ambulatory Visit: Payer: Medicaid Other | Admitting: Internal Medicine

## 2022-04-09 VITALS — BP 110/78 | HR 75 | Temp 97.7°F | Resp 18 | Ht 63.0 in | Wt 128.0 lb

## 2022-04-09 DIAGNOSIS — G4700 Insomnia, unspecified: Secondary | ICD-10-CM

## 2022-04-09 DIAGNOSIS — R079 Chest pain, unspecified: Secondary | ICD-10-CM | POA: Diagnosis not present

## 2022-04-09 MED ORDER — LIDOCAINE 5 % EX PTCH: 1.0000 | MEDICATED_PATCH | CUTANEOUS | 0 refills | Status: AC | PRN

## 2022-04-09 MED ORDER — TRAZODONE HCL 100 MG PO TABS: 100.0000 mg | ORAL_TABLET | Freq: Every day | ORAL | 3 refills | Status: DC

## 2022-04-09 NOTE — Assessment & Plan Note (Signed)
Will continue with trazodone 100 mg at bedtime as needed for insomnia.

## 2022-04-09 NOTE — Progress Notes (Signed)
Office Visit  Subjective   Patient ID: Melissa Walker   DOB: November 15, 1980   Age: 42 y.o.   MRN: [id number]   Chief Complaint Chief Complaint  Patient presents with   Follow-up    ER  visit for Chest pain.     History of Present Illness 42 years old female was seen in ED with chest pain and work up was normal. She says she still has left-sided chest pain usually on the left leg.  Chest.  It comes and goes but no relation with walking and no relation with breathing.  She says that the patch that they gave her in the hospital did help to resolve her pain.  No cough or shortness of breath. He also says that she is running out of trazodone 100 mg that she take at bedtime that helped with sleep.  She needs refill of that medicine.  Past Medical History Past Medical History:  Diagnosis Date   Abnormal uterine bleeding 02/04/2012   Occurred 01/12/12-01/15/12. Evaluated in [facility] and all labs, exam, and pelvic u/s WNL. Given rx for ibuprofen and told to follow up as otupatient.     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   PUD (peptic ulcer disease) 07/13/2018   Tobacco abuse    Underweight 10/26/2014     Allergies Allergies  Allergen Reactions   Mobic [Meloxicam] Other (See Comments)    Stomach bleeding    Penicillins Other (See Comments)    uncle highly allergic so was told whole family should not take it Did it involve swelling of the face/tongue/throat, SOB, or low BP? Unknown Did it involve sudden or severe rash/hives, skin peeling, or any reaction on the inside of your mouth or nose? Unknown Did you need to seek medical attention at a hospital or doctor's  office? Unknown When did it last happen?      Never taken before If all above answers are "NO", may proceed with cephalosporin use.  Unknown, patient has never taken the medication but states her uncle has an allergy to it.    Nicotine Rash    Transdermal Patch     Review of Systems Review of Systems  Constitutional: Negative.   HENT: Negative.    Respiratory: Negative.    Cardiovascular:  Positive for chest pain.  Gastrointestinal: Negative.   Neurological: Negative.        Objective:    Vitals BP 110/78 (BP Location: Left Arm, Patient Position: Sitting, Cuff Size: Normal)   Pulse 75   Temp 97.7 F (36.5 C)   Resp 18   Ht '5\' 3"'$  (1.6 m)   Wt 128 lb (58.1 kg)   LMP 01/17/2021   SpO2 99%   BMI 22.67 kg/m    Physical Examination Physical Exam Constitutional:      Appearance: Normal appearance.  HENT:     Head: Normocephalic and atraumatic.  Cardiovascular:     Rate and Rhythm: Normal rate and regular rhythm.     Heart sounds: Normal heart sounds.  Pulmonary:     Effort: Pulmonary effort is normal.     Breath sounds:  Normal breath sounds.  Neurological:     Mental Status: She is alert.        Assessment & Plan:   Chest pain at rest Atypical chest pain probably from musckloskeletal origin, lidoderm has helped her a lot so will use Lidoderm patch as needed.  Insomnia Will continue with trazodone 100 mg at bedtime as needed for insomnia.    No follow-ups on file.   Garwin Brothers, MD

## 2022-04-09 NOTE — Assessment & Plan Note (Addendum)
Atypical chest pain probably from musckloskeletal origin, lidoderm has helped her a lot so will use Lidoderm patch as needed.

## 2022-04-14 ENCOUNTER — Encounter: Payer: Self-pay | Admitting: Internal Medicine

## 2022-04-29 NOTE — Telephone Encounter (Signed)
Pt. Coming in for appt.Melissa Walker

## 2022-04-30 ENCOUNTER — Ambulatory Visit: Payer: Medicaid Other | Admitting: Internal Medicine

## 2022-04-30 ENCOUNTER — Encounter: Payer: Self-pay | Admitting: Internal Medicine

## 2022-04-30 VITALS — BP 120/70 | HR 81 | Temp 98.0°F | Resp 18 | Ht 63.0 in | Wt 131.1 lb

## 2022-04-30 DIAGNOSIS — R600 Localized edema: Secondary | ICD-10-CM | POA: Insufficient documentation

## 2022-04-30 DIAGNOSIS — G609 Hereditary and idiopathic neuropathy, unspecified: Secondary | ICD-10-CM

## 2022-04-30 DIAGNOSIS — G629 Polyneuropathy, unspecified: Secondary | ICD-10-CM | POA: Insufficient documentation

## 2022-04-30 NOTE — Progress Notes (Signed)
   Acute Office Visit  Subjective:     Patient ID: Melissa Walker, female    DOB: 11/23/1980, 42 y.o.   MRN: [id number]  Chief Complaint  Patient presents with   Leg Swelling    Bilateral Leg swelling     HPI Patient is in today for swelling of her both legs.  Patient says that in the morning there is no swelling but as the day goes on and she stay on her feet longer her both legs get swollen. She also is complaining of loss of feeling in the both feet noted during pedicure visit when she realized she has no feeling in her both feet.  She denies having any pain, no bowel laying feeling no pain or needles.  Review of Systems  Respiratory: Negative.    Cardiovascular:  Positive for leg swelling.        Objective:    BP 120/70 (BP Location: Left Arm, Patient Position: Sitting, Cuff Size: Normal)   Pulse 81   Temp 98 F (36.7 C)   Resp 18   Ht 5' 3"$  (1.6 m)   Wt 131 lb 2 oz (59.5 kg)   LMP 01/17/2021   SpO2 99%   BMI 23.23 kg/m    Physical Exam Constitutional:      Appearance: Normal appearance.  Cardiovascular:     Pulses: Normal pulses.  Neurological:     Mental Status: She is alert.     Comments: She has no feeling in both feet but both feet's are warm.     No results found for any visits on 04/30/22.      Assessment & Plan:   Problem List Items Addressed This Visit       Nervous and Auditory   Peripheral neuropathy - Primary    I will do B12 and TSH level. If normal and symptoms persist then may need nerve conduction study.        Other   Bilateral leg edema    She will wear elastic stocking during day time.       No orders of the defined types were placed in this encounter.   No follow-ups on file.  Garwin Brothers, MD

## 2022-04-30 NOTE — Assessment & Plan Note (Signed)
She will wear elastic stocking during day time.

## 2022-04-30 NOTE — Assessment & Plan Note (Signed)
I will do B12 and TSH level. If normal and symptoms persist then may need nerve conduction study.

## 2022-05-01 LAB — VITAMIN B12: Vitamin B-12: 229 pg/mL — ABNORMAL LOW (ref 232–1245)

## 2022-05-01 LAB — TSH: TSH: 1.06 u[IU]/mL (ref 0.450–4.500)

## 2022-05-23 ENCOUNTER — Emergency Department (HOSPITAL_BASED_OUTPATIENT_CLINIC_OR_DEPARTMENT_OTHER): Payer: Medicaid Other

## 2022-05-23 ENCOUNTER — Encounter (HOSPITAL_BASED_OUTPATIENT_CLINIC_OR_DEPARTMENT_OTHER): Payer: Self-pay | Admitting: Emergency Medicine

## 2022-05-23 ENCOUNTER — Emergency Department (HOSPITAL_BASED_OUTPATIENT_CLINIC_OR_DEPARTMENT_OTHER)
Admission: EM | Admit: 2022-05-23 | Discharge: 2022-05-23 | Disposition: A | Discharge status: Home / Self Care | Payer: Medicaid Other | Attending: Emergency Medicine | Admitting: Emergency Medicine

## 2022-05-23 ENCOUNTER — Other Ambulatory Visit: Payer: Self-pay

## 2022-05-23 DIAGNOSIS — R102 Pelvic and perineal pain: Secondary | ICD-10-CM | POA: Insufficient documentation

## 2022-05-23 DIAGNOSIS — N83202 Unspecified ovarian cyst, left side: Secondary | ICD-10-CM | POA: Insufficient documentation

## 2022-05-23 DIAGNOSIS — N939 Abnormal uterine and vaginal bleeding, unspecified: Secondary | ICD-10-CM | POA: Diagnosis present

## 2022-05-23 LAB — URINALYSIS, ROUTINE W REFLEX MICROSCOPIC
Bilirubin Urine: NEGATIVE
Glucose, UA: NEGATIVE mg/dL
Ketones, ur: NEGATIVE mg/dL
Leukocytes,Ua: NEGATIVE
Nitrite: NEGATIVE
Protein, ur: NEGATIVE mg/dL
Specific Gravity, Urine: 1.02 (ref 1.005–1.030)
pH: 5.5 (ref 5.0–8.0)

## 2022-05-23 LAB — CBC WITH DIFFERENTIAL/PLATELET
Abs Immature Granulocytes: 0.01 10*3/uL (ref 0.00–0.07)
Basophils Absolute: 0 10*3/uL (ref 0.0–0.1)
Basophils Relative: 1 %
Eosinophils Absolute: 0.3 10*3/uL (ref 0.0–0.5)
Eosinophils Relative: 5 %
HCT: 33.2 % — ABNORMAL LOW (ref 36.0–46.0)
Hemoglobin: 11.4 g/dL — ABNORMAL LOW (ref 12.0–15.0)
Immature Granulocytes: 0 %
Lymphocytes Relative: 33 %
Lymphs Abs: 2 10*3/uL (ref 0.7–4.0)
MCH: 35.8 pg — ABNORMAL HIGH (ref 26.0–34.0)
MCHC: 34.3 g/dL (ref 30.0–36.0)
MCV: 104.4 fL — ABNORMAL HIGH (ref 80.0–100.0)
Monocytes Absolute: 0.4 10*3/uL (ref 0.1–1.0)
Monocytes Relative: 6 %
Neutro Abs: 3.3 10*3/uL (ref 1.7–7.7)
Neutrophils Relative %: 55 %
Platelets: 290 10*3/uL (ref 150–400)
RBC: 3.18 MIL/uL — ABNORMAL LOW (ref 3.87–5.11)
RDW: 12.6 % (ref 11.5–15.5)
WBC: 6 10*3/uL (ref 4.0–10.5)
nRBC: 0 % (ref 0.0–0.2)

## 2022-05-23 LAB — URINALYSIS, MICROSCOPIC (REFLEX)

## 2022-05-23 LAB — PREGNANCY, URINE: Preg Test, Ur: NEGATIVE

## 2022-05-23 NOTE — ED Provider Notes (Signed)
[facility] Provider Note   CSN: [id number] Arrival date & time: 05/23/22  1644     History  Chief Complaint  Patient presents with   Dysuria    Melissa Walker is a 42 y.o. female history of postmenopause presented with vaginal bleeding for the past 6 days.  Patient was evaluated at urgent care 4 days ago and diagnosed with post menopausal bleeding and given follow-up with an GYN for transvaginal ultrasound.  Patient states she is still having vaginal bleeding where she goes through 1 pad every few hours and cannot see GYN until the end of April.  Patient states she is having fatigue.  Patient also endorsed dribbling when she goes to urinate.   Patient denied chest pain, shortness of breath, abdominal pain, nausea/vomiting, fever/chills, hematuria, blood thinners, weight changes or night sweats  Home Medications Prior to Admission medications   Medication Sig Start Date End Date Taking? Authorizing Provider  atenolol (TENORMIN) 25 MG tablet Take 1 tablet (25 mg total) by mouth daily. 03/31/22   Garwin Brothers, MD  cyclobenzaprine (FEXMID) 7.5 MG tablet Take 1 tablet (7.5 mg total) by mouth 2 (two) times daily between meals as needed for muscle spasms. 03/31/22   Garwin Brothers, MD  DULoxetine (CYMBALTA) 60 MG capsule TAKE 1 CAPSULE(60 MG) BY MOUTH DAILY 03/18/21   Jose Persia, MD  famotidine (PEPCID) 20 MG tablet Take 1 tablet (20 mg total) by mouth daily as needed for heartburn or indigestion. 01/08/20   Jose Persia, MD  fluticasone (FLONASE) 50 MCG/ACT nasal spray Place 1 spray into both nostrils daily. 03/22/18 03/31/22  Alphonzo Grieve, MD  gabapentin (NEURONTIN) 300 MG capsule Take 300 mg by mouth 2 (two) times daily.    [provider]  HYDROcodone-acetaminophen (NORCO) 5-325 MG tablet Take 1 tablet by mouth every 8 (eight) hours as needed for moderate pain or severe pain. 05/09/21   Jose Persia, MD  hydrOXYzine (ATARAX) 25 MG  tablet Take 1 tablet (25 mg total) by mouth every 8 (eight) hours as needed for itching. 05/15/21   Jose Persia, MD  lidocaine (LIDODERM) 5 % Place 1 patch onto the skin as needed. Remove & Discard patch within 12 hours or as directed by MD 04/09/22   Garwin Brothers, MD  losartan (COZAAR) 50 MG tablet Take 1 tablet (50 mg total) by mouth daily. 03/31/22   Garwin Brothers, MD  norethindrone (MICRONOR) 0.35 MG tablet Take 1 tablet (0.35 mg total) by mouth daily. 07/22/20   Caren Macadam, MD  traZODone (DESYREL) 100 MG tablet Take 1 tablet (100 mg total) by mouth at bedtime. 04/09/22   Garwin Brothers, MD  triamcinolone cream (KENALOG) 0.5 % Apply 1 application topically 2 (two) times daily. 05/06/20   Sanjuan Dame, MD      Allergies    Mobic [meloxicam], Penicillins, and Nicotine    Review of Systems   Review of Systems  Genitourinary:  Positive for dysuria.  See HPI  Physical Exam Updated Vital Signs BP 126/70 (BP Location: Right Arm)   Pulse 86   Temp 98.4 F (36.9 C) (Oral)   Resp 18   Ht 5\' 3"  (1.6 m)   Wt 60.3 kg   LMP 01/17/2021   SpO2 98%   BMI 23.56 kg/m  Physical Exam Constitutional:      General: She is not in acute distress. Eyes:     Conjunctiva/sclera: Conjunctivae normal.  Cardiovascular:     Rate and Rhythm:  Normal rate.     Pulses: Normal pulses.  Pulmonary:     Effort: Pulmonary effort is normal. No respiratory distress.  Abdominal:     Tenderness: There is no abdominal tenderness. There is no guarding or rebound.  Musculoskeletal:     Cervical back: Normal range of motion.  Skin:    General: Skin is warm and dry.     Capillary Refill: Capillary refill takes less than 2 seconds.  Neurological:     General: No focal deficit present.     Mental Status: She is alert and oriented to person, place, and time.     ED Results / Procedures / Treatments   Labs (all labs ordered are listed, but only abnormal results are displayed) Labs Reviewed  URINALYSIS,  ROUTINE W REFLEX MICROSCOPIC - Abnormal; Notable for the following components:      Result Value   APPearance HAZY (*)    Hgb urine dipstick LARGE (*)    All other components within normal limits  URINALYSIS, MICROSCOPIC (REFLEX) - Abnormal; Notable for the following components:   Bacteria, UA MANY (*)    All other components within normal limits  CBC WITH DIFFERENTIAL/PLATELET - Abnormal; Notable for the following components:   RBC 3.18 (*)    Hemoglobin 11.4 (*)    HCT 33.2 (*)    MCV 104.4 (*)    MCH 35.8 (*)    All other components within normal limits  PREGNANCY, URINE    EKG None  Radiology US PELVIC COMPLETE WITH TRANSVAGINAL  Result Date: 05/23/2022 CLINICAL DATA:  Postmenopausal bleeding. EXAM: TRANSABDOMINAL AND TRANSVAGINAL ULTRASOUND OF PELVIS TECHNIQUE: Both transabdominal and transvaginal ultrasound examinations of the pelvis were performed. Transabdominal technique was performed for global imaging of the pelvis including uterus, ovaries, adnexal regions, and pelvic cul-de-sac. It was necessary to proceed with endovaginal exam following the transabdominal exam to visualize the endometrium and ovaries. COMPARISON:  Pelvic ultrasound dated 02/20/2019. FINDINGS: Uterus Measurements: 6.1 x 4.2 x 4.8 cm = volume: 64 mL. No fibroids or other mass visualized. Endometrium Thickness: 4 mm.  No focal abnormality visualized. Right ovary Measurements: 6.1 x 3.7 x 3.8 cm = volume: 45 mL. There is a 5.6 x 3.6 x 3.2 cm simple cyst in the right ovary. Left ovary Measurements: 2.1 x 1.0 x 1.9 cm = volume: 2.1 mL. Normal appearance/no adnexal mass. Other findings No abnormal free fluid. IMPRESSION: Large left ovarian cysts measure up to approximately 6 cm, otherwise unremarkable pelvic ultrasound. Consider GYN consult and followup US in 3-6 months, or pelvis MRI w/o and w/ contrast for improved characterization. Note: This recommendation does not apply to premenarchal patients or to those with  increased risk (genetic, family history, elevated tumor markers or other high-risk factors) of ovarian cancer. Reference: Radiology 2019 Nov; 293(2):359-371. Electronically Signed   By: Anner Crete M.D.   On: 05/23/2022 18:23    Procedures Procedures    Medications Ordered in ED Medications - No data to display  ED Course/ Medical Decision Making/ A&P                             Medical Decision Making Amount and/or Complexity of Data Reviewed Labs: ordered. Radiology: ordered.   [name] 42 y.o. presented today for vaginal bleeding. Working DDx that I considered at this time includes, but not limited to, endometrial cancer, polyps, ovarian cyst, AUB, anemia.  R/o DDx: Endometrial cancer, polyps, anemia, AUB:  These are considered less likely due to history of present illn to meet ess and physical exam findings  Review of prior external notes: 05/18/2022 unknown  Unique Tests and My Interpretation:  UA: Large amount of blood with many bacteria Urine pregnancy: Negative CBC differential: Unremarkable, similar to previous readings a month ago Pelvic ultrasound: 6 mm left ovarian cyst, endometrial lining 4 mm  Discussion with Independent Historian: None  Discussion of Management of Tests: None  Risk: Low:  - based on diagnostic testing/clinical impression and treatment plan  Risk Stratification Score: None  Plan: Patient presented for vaginal bleeding. On exam patient was in no acute distress and stable vitals.  Patient had unremarkable physical exam and labs were ordered along with pelvic ultrasound to further evaluate her postmenopausal bleeding.  Urinalysis showed blood in her urine.  Patient stable at this time.  Ultrasound showed that patient had a large left ovarian cyst of 6 mm that will need to be followed up in 3 to 6 months with an ultrasound or possibly pelvic MRI.  At this time patient's had unremarkable labs and has remained asymptomatic with stable  vitals throughout ED course.  Patient is fit for discharge at this time with outpatient follow-up.  I spoke with patient about these findings and how she may alternate every 6 hours as needed for pain between Tylenol and ibuprofen.  Patient will be given follow-up at the women's clinic.  Patient was given return precautions. Patient stable for discharge at this time.  Patient verbalized understanding of plan.         Final Clinical Impression(s) / ED Diagnoses Final diagnoses:  Cyst of left ovary    Rx / DC Orders ED Discharge Orders     None         Elvina Sidle 05/23/22 1845    Gareth Morgan, MD 05/25/22 [phone]

## 2022-05-23 NOTE — Discharge Instructions (Addendum)
Please follow-up with the women's clinic I have attached your regarding your left ovarian cyst to be evaluated by gynecologist.  The ultrasound today found that your left ovarian cyst is roughly 6 mm and will need to be reevaluated in 3 to 6 months with an ultrasound or possibly a pelvis MRI with and without contrast.  You may alternate every 6 hours as needed for pain between Tylenol or ibuprofen.  Please monitor your symptoms if symptoms worsen please return to ER.

## 2022-05-23 NOTE — ED Triage Notes (Signed)
Dysuria since last Sunday. Was eval at [facility] and was d/c with post men bleeding

## 2022-06-05 ENCOUNTER — Ambulatory Visit: Payer: Medicaid Other | Admitting: Internal Medicine

## 2022-06-05 ENCOUNTER — Encounter: Payer: Self-pay | Admitting: Internal Medicine

## 2022-06-05 VITALS — BP 120/72 | HR 86 | Temp 98.0°F | Resp 18 | Ht 63.0 in | Wt 136.0 lb

## 2022-06-05 DIAGNOSIS — M7989 Other specified soft tissue disorders: Secondary | ICD-10-CM

## 2022-06-05 DIAGNOSIS — M25475 Effusion, left foot: Secondary | ICD-10-CM | POA: Diagnosis not present

## 2022-06-05 DIAGNOSIS — M25471 Effusion, right ankle: Secondary | ICD-10-CM

## 2022-06-05 DIAGNOSIS — M25474 Effusion, right foot: Secondary | ICD-10-CM

## 2022-06-05 DIAGNOSIS — M25472 Effusion, left ankle: Secondary | ICD-10-CM

## 2022-06-05 MED ORDER — DICLOFENAC SODIUM 75 MG PO TBEC: 75.0000 mg | DELAYED_RELEASE_TABLET | Freq: Two times a day (BID) | ORAL | 0 refills | Status: DC

## 2022-06-05 NOTE — Progress Notes (Signed)
   Acute Office Visit  Subjective:     Patient ID: Melissa Walker, female    DOB: Aug 01, 1980, 42 y.o.   MRN: [id number]  Chief Complaint  Patient presents with   office visit    Foot and leg swelling     HPI Patient is in today for swelling of both feet and ankles for 7 days. No injury or fall. She says it hurt when she walk.   Review of Systems  Constitutional: Negative.   Musculoskeletal:        Mildly swollen both ankles         Objective:    BP 120/72   Pulse 86   Temp 98 F (36.7 C)   Resp 18   Ht 5\' 3"  (1.6 m)   Wt 136 lb (61.7 kg)   LMP 01/17/2021   SpO2 98%   BMI 24.09 kg/m    Physical Exam Constitutional:      Appearance: Normal appearance.  Musculoskeletal:     Comments: Mild edema of both ankles with slight tenderness below ankle on medial side.  Neurological:     Mental Status: She is alert.     No results found for any visits on 06/05/22.      Assessment & Plan:   Problem List Items Addressed This Visit       Other   RESOLVED: Bilateral swelling of feet and ankles    Applied ACE bandage to both ankles and feet. She will take diclofenac sodium PRN for pain      Relevant Orders   Apply wrap   Other Visit Diagnoses     Swollen feet    -  Primary   Relevant Orders   Apply wrap       Meds ordered this encounter  Medications   diclofenac (VOLTAREN) 75 MG EC tablet    Sig: Take 1 tablet (75 mg total) by mouth 2 (two) times daily.    Dispense:  30 tablet    Refill:  0  If not better then will do xray.  No follow-ups on file.  Eloisa Northern, MD

## 2022-06-05 NOTE — Assessment & Plan Note (Signed)
Applied ACE bandage to both ankles and feet. She will take diclofenac sodium PRN for pain

## 2022-06-24 ENCOUNTER — Telehealth: Payer: Self-pay

## 2022-06-24 ENCOUNTER — Ambulatory Visit (INDEPENDENT_AMBULATORY_CARE_PROVIDER_SITE_OTHER): Payer: Medicaid Other | Admitting: Advanced Practice Midwife

## 2022-06-24 ENCOUNTER — Other Ambulatory Visit: Payer: Self-pay

## 2022-06-24 VITALS — BP 122/77 | HR 80 | Wt 128.7 lb

## 2022-06-24 DIAGNOSIS — N83201 Unspecified ovarian cyst, right side: Secondary | ICD-10-CM

## 2022-06-24 DIAGNOSIS — N950 Postmenopausal bleeding: Secondary | ICD-10-CM

## 2022-06-24 DIAGNOSIS — Z1231 Encounter for screening mammogram for malignant neoplasm of breast: Secondary | ICD-10-CM | POA: Diagnosis not present

## 2022-06-24 NOTE — Telephone Encounter (Signed)
-----   Message from [place], [place] sent at 06/24/2022  1:22 PM EDT ----- Regarding: Mammogram Please scheduled screening mammogram.

## 2022-06-24 NOTE — Progress Notes (Unsigned)
GYNECOLOGY ANNUAL PREVENTATIVE CARE ENCOUNTER NOTE  History:     Melissa Walker is a 43 y.o. G41P2002 female here for a post-menopausal bleeding.  Current complaints: Had vaginal bleeding like a heavy period for 6 days stating 05/17/22 followed by brown spotting. Denies abnormal vaginal bleeding today, discharge, pelvic pain, problems with intercourse or other gynecologic concerns.  Was seen in ED in March 2024 for bleeding. Pelvic US showed this endometrial stipe and 6 cm simple right ovarian cyst. (Report is incorrect). Pt had cyst in 2020 that resolved.     Gynecologic History Patient's last menstrual period was 01/17/2021. Contraception: post menopausal status and tubal ligation Last Pap: 2022. Results were: normal with negative HPV Last mammogram: None. Results were: NA  At 89 pt is very young for menopause. Discussed medical and medical Hx to see if there is alternate explanation for absence of menses since 2022. Nothing identified. Pt used to be very underweight at 87 pounds more than 10 years ago but has been around 120 lb for the past several years. Denies having FSH checked recently. Was 6.8 in 2017. TSH 1.06 in 2024.  Obstetric History OB History  Gravida Para Term Preterm AB Living  2 2 2     2   SAB IAB Ectopic Multiple Live Births          2    # Outcome Date GA Lbr Len/2nd Weight Sex Delivery Anes PTL Lv  2 Term 05/10/13 [redacted]w[redacted]d 02:51 / 00:11 7 lb 2.8 oz (3.255 kg) M Vag-Spont None  LIV  1 Term 2005 [redacted]w[redacted]d  7 lb 1 oz (3.204 kg) M Vag-Spont EPI  LIV     Birth Comments: No complications     Past Medical History:  Diagnosis Date   Abnormal uterine bleeding 02/04/2012   Occurred 01/12/12-01/15/12. Evaluated in [facility] and all labs, exam, and pelvic u/s WNL. Given rx for ibuprofen and told to follow up as otupatient.     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   PUD (peptic ulcer disease) 07/13/2018   Tobacco abuse    Underweight 10/26/2014    Past Surgical History:  Procedure Laterality Date   DENTAL REHABILITATION     full set dentures   LUMBAR LAMINECTOMY/DECOMPRESSION MICRODISCECTOMY Right 07/23/2014   Procedure: Right Lumbar five-Sacral one laminotomy and microdiskectomy;  Surgeon: Shirlean Kelly, MD;  Location: [facility];  Service: Neurosurgery;  Laterality: Right;  Right Lumbar five-Sacral one laminotomy and microdiskectomy   Multiple epidural steroid injections in back     Over past 2 years   POSTERIOR LAMINECTOMY / DECOMPRESSION LUMBAR SPINE  06/23/2018   L5-S1 decompression    TUBAL LIGATION Bilateral 05/11/2013   Procedure: POST PARTUM TUBAL LIGATION;  Surgeon: Tereso Newcomer, MD;  Location: [facility];  Service: Gynecology;  Laterality: Bilateral;    Current Outpatient Medications on File Prior to Visit  Medication Sig Dispense Refill   atenolol (TENORMIN) 25 MG tablet Take 1 tablet (25 mg total) by mouth daily. 30 tablet 6   cyclobenzaprine (FEXMID) 7.5 MG tablet Take 1 tablet (7.5 mg total) by mouth 2 (  two) times daily between meals as needed for muscle spasms. 60 tablet 3   diclofenac (VOLTAREN) 75 MG EC tablet Take 1 tablet (75 mg total) by mouth 2 (two) times daily. 30 tablet 0   DULoxetine (CYMBALTA) 60 MG capsule TAKE 1 CAPSULE(60 MG) BY MOUTH DAILY 90 capsule 1   famotidine (PEPCID) 20 MG tablet Take 1 tablet (20 mg total) by mouth daily as needed for heartburn or indigestion. 60 tablet 3   fluticasone (FLONASE) 50 MCG/ACT nasal spray Place 1 spray into both nostrils daily. 16 g 2   gabapentin (NEURONTIN) 300 MG capsule Take 300 mg by mouth 2 (two) times daily.     HYDROcodone-acetaminophen (NORCO) 5-325 MG  tablet Take 1 tablet by mouth every 8 (eight) hours as needed for moderate pain or severe pain. 90 tablet 0   hydrOXYzine (ATARAX) 25 MG tablet Take 1 tablet (25 mg total) by mouth every 8 (eight) hours as needed for itching. 120 tablet 0   lidocaine (LIDODERM) 5 % Place 1 patch onto the skin as needed. Remove & Discard patch within 12 hours or as directed by MD 15 patch 0   losartan (COZAAR) 50 MG tablet Take 1 tablet (50 mg total) by mouth daily. 90 tablet 3   norethindrone (MICRONOR) 0.35 MG tablet Take 1 tablet (0.35 mg total) by mouth daily. 28 tablet 11   traZODone (DESYREL) 100 MG tablet Take 1 tablet (100 mg total) by mouth at bedtime. 30 tablet 3   triamcinolone cream (KENALOG) 0.5 % Apply 1 application topically 2 (two) times daily. 15 g 0   No current facility-administered medications on file prior to visit.    Allergies  Allergen Reactions   Mobic [Meloxicam] Other (See Comments)    Stomach bleeding    Tape Rash    Nicotine patches   Penicillins Other (See Comments)    uncle highly allergic so was told whole family should not take it Did it involve swelling of the face/tongue/throat, SOB, or low BP? Unknown Did it involve sudden or severe rash/hives, skin peeling, or any reaction on the inside of your mouth or nose? Unknown Did you need to seek medical attention at a hospital or doctor's office? Unknown When did it last happen?      Never taken before If all above answers are "NO", may proceed with cephalosporin use.  Unknown, patient has never taken the medication but states her uncle has an allergy to it.    Nicotine Rash    Transdermal Patch    Social History:  reports that she quit smoking about 5 months ago. Her smoking use included cigarettes. She has a 9.00 pack-year smoking history. She has never used smokeless tobacco. She reports current alcohol use. She reports that she does not use drugs.  Family History  Problem Relation Age of Onset   Cancer Father         brain tumor    Eclampsia Maternal Grandmother    Heart attack Maternal Grandmother    Cancer Maternal Grandmother        lung   Heart disease Maternal Grandmother    Hyperlipidemia Maternal Grandmother    Hypertension Maternal Grandmother     The following portions of the patient's history were reviewed and updated as appropriate: allergies, current medications, past family history, past medical history, past social history, past surgical history and problem list.  Review of Systems Review of Systems  Gastrointestinal:  Negative for abdominal pain.  Genitourinary:  Negative for  dyspareunia, vaginal bleeding and vaginal discharge.  Neurological:  Negative for dizziness.     Physical Exam:  BP 122/77   Pulse 80   Wt 128 lb 11.2 oz (58.4 kg)   LMP 01/17/2021   BMI 22.80 kg/m  CONSTITUTIONAL: Well-developed, well-nourished, slender female in no acute distress.  HENT:  Normocephalic, atraumatic. Oropharynx is clear and moist EYES: Conjunctivae normal. No scleral icterus.  SKIN: Skin is warm and dry. Extensive facial scarring noted. Not diaphoretic. No erythema. No pallor. MUSCULOSKELETAL: Normal range of motion. No tenderness.  No cyanosis or edema.   NEUROLOGIC: Alert and oriented to person, place, and time. Normal muscle tone coordination.  PSYCHIATRIC: Normal mood and affect. Normal behavior. Normal judgment and thought content. CARDIOVASCULAR: Normal heart rate noted. RESPIRATORY: Effort and rate normal. BREASTS: Declined ABDOMEN: Soft, no distention, tenderness, rebound or guarding.  PELVIC: Normal appearing external genitalia; normal appearing vaginal and cervix with pale mucosa and without rugae. No Polyps visualized. No abnormal discharge noted.  Pap smear not obtained.      Latest Ref Rng & Units 05/23/2022    5:30 PM 04/01/2022   11:09 AM 02/07/2021    2:48 PM  CBC  WBC 4.0 - 10.5 K/uL 6.0  5.1  8.2   Hemoglobin 12.0 - 15.0 g/dL 16.1  09.6  04.5   Hematocrit 36.0 -  46.0 % 33.2  33.3  45.9   Platelets 150 - 400 K/uL 290  297  425    US PELVIC COMPLETE WITH TRANSVAGINAL  Result Date: 05/23/2022 CLINICAL DATA:  Postmenopausal bleeding. EXAM: TRANSABDOMINAL AND TRANSVAGINAL ULTRASOUND OF PELVIS TECHNIQUE: Both transabdominal and transvaginal ultrasound examinations of the pelvis were performed. Transabdominal technique was performed for global imaging of the pelvis including uterus, ovaries, adnexal regions, and pelvic cul-de-sac. It was necessary to proceed with endovaginal exam following the transabdominal exam to visualize the endometrium and ovaries. COMPARISON:  Pelvic ultrasound dated 02/20/2019. FINDINGS: Uterus Measurements: 6.1 x 4.2 x 4.8 cm = volume: 64 mL. No fibroids or other mass visualized. Endometrium Thickness: 4 mm.  No focal abnormality visualized. Right ovary Measurements: 6.1 x 3.7 x 3.8 cm = volume: 45 mL. There is a 5.6 x 3.6 x 3.2 cm simple cyst in the right ovary. Left ovary Measurements: 2.1 x 1.0 x 1.9 cm = volume: 2.1 mL. Normal appearance/no adnexal mass. Other findings No abnormal free fluid. IMPRESSION: Large left ovarian cysts measure up to approximately 6 cm, otherwise unremarkable pelvic ultrasound. Consider GYN consult and followup US in 3-6 months, or pelvis MRI w/o and w/ contrast for improved characterization. Note: This recommendation does not apply to premenarchal patients or to those with increased risk (genetic, family history, elevated tumor markers or other high-risk factors) of ovarian cancer. Reference: Radiology 2019 Nov; 293(2):359-371. Electronically Signed   By: Elgie Collard M.D.   On: 05/23/2022 18:23   Assessment and Plan:    1. Postmenopausal bleeding- Likely in early menopause bases on cessation of menses > 12 months prior to bleeding in March and atrophic appearance of vagina.    - FSH to help determine menopausal state - Discussed possible endometrial Bx today vs after [facility] results. OK to wait. F/U w/ Gyn.  -  Recent US shows 4 mm endometrial stripe which is reassuring - No polyps or vaginal cervical sources of bleeding identified but is has been 1 month since last bleeding episode.   2. 6 cm Simple Ovarian Cyst- This is a new cyst since 2020 - F/U with  Gyn considering pts age to discuss F/U and management   3. Encounter for screening mammogram for malignant neoplasm of breast - MM Digital Screening; Future  Will follow up results of [facility] Mammogram scheduled Routine preventative health maintenance measures emphasized. Please refer to After Visit Summary for other counseling recommendations.      Dorathy Kinsman, CNM [facility] [employer], [facility]

## 2022-06-25 LAB — FOLLICLE STIMULATING HORMONE: FSH: 26.8 m[IU]/mL

## 2022-06-25 NOTE — Telephone Encounter (Signed)
Call placed to pt. Pt give update to schedule MMG. Pt agreeable to plan of care. Pt will see MMG appt in mychart once made by our office. Pt verbalized understanding. Has follow up appt on 5/14 with Dr Briscoe Deutscher.   MMG scheduled for Monday June 3rd 1250pm. Sent pt mychart message with location information. Judeth Cornfield, RNC

## 2022-06-30 ENCOUNTER — Ambulatory Visit (HOSPITAL_BASED_OUTPATIENT_CLINIC_OR_DEPARTMENT_OTHER)
Admission: RE | Admit: 2022-06-30 | Discharge: 2022-06-30 | Disposition: A | Discharge status: Home / Self Care | Payer: Medicaid Other | Source: Ambulatory Visit | Attending: Internal Medicine | Admitting: Internal Medicine

## 2022-06-30 ENCOUNTER — Encounter: Payer: Self-pay | Admitting: Internal Medicine

## 2022-06-30 ENCOUNTER — Encounter (HOSPITAL_BASED_OUTPATIENT_CLINIC_OR_DEPARTMENT_OTHER): Payer: Self-pay

## 2022-06-30 ENCOUNTER — Ambulatory Visit: Payer: Medicaid Other | Admitting: Internal Medicine

## 2022-06-30 VITALS — BP 124/74 | HR 63 | Temp 98.3°F | Resp 18 | Ht 63.0 in | Wt 128.4 lb

## 2022-06-30 DIAGNOSIS — R002 Palpitations: Secondary | ICD-10-CM | POA: Diagnosis not present

## 2022-06-30 DIAGNOSIS — G609 Hereditary and idiopathic neuropathy, unspecified: Secondary | ICD-10-CM | POA: Diagnosis not present

## 2022-06-30 DIAGNOSIS — I10 Essential (primary) hypertension: Secondary | ICD-10-CM

## 2022-06-30 DIAGNOSIS — R1031 Right lower quadrant pain: Secondary | ICD-10-CM | POA: Insufficient documentation

## 2022-06-30 DIAGNOSIS — E538 Deficiency of other specified B group vitamins: Secondary | ICD-10-CM

## 2022-06-30 DIAGNOSIS — F419 Anxiety disorder, unspecified: Secondary | ICD-10-CM | POA: Diagnosis not present

## 2022-06-30 MED ORDER — IOHEXOL 300 MG/ML  SOLN
100.0000 mL | Freq: Once | INTRAMUSCULAR | Status: AC | PRN
  Administered 2022-06-30: 100 mL via INTRAVENOUS

## 2022-06-30 NOTE — Assessment & Plan Note (Signed)
Better with dulexetine

## 2022-06-30 NOTE — Assessment & Plan Note (Signed)
Her blood pressure is well controlled. 

## 2022-06-30 NOTE — Progress Notes (Addendum)
Office Visit  Subjective   Patient ID: Melissa Walker   DOB: 07-26-1980   Age: 42 y.o.   MRN: [id number]   Chief Complaint Chief Complaint  Patient presents with  . Follow-up    3 month follow up     History of Present Illness 42 years old female who is here for follow up. She was seen by [facility] for post menopausal vaginal bleeding and ovarian cyst and she will follow with them for possible endometrial biopsy.   She is c/o cramp right upper quadrant, it comes and goes, some time it is worse. She says when she lie down it ease off. No relation with eating or being [place]. She denies any constipation. She goes to pain clinic for her lower back pain.    She has hypertension and her blood pressure is well controlled. She also says her palpitations are also better. She occasionally check her blood pressure at home, she take atenolol and losartan daily. She was seen by cardiologist for the same pain and her heart was ok per patient. She has peripheral neuropathy and her B12 level was low 2 months ago. She does not take replacement for B12. She also says her anxiety and depression is better.   Past Medical History Past Medical History:  Diagnosis Date  . Abnormal uterine bleeding 02/04/2012   Occurred 01/12/12-01/15/12. Evaluated in [facility] and all labs, exam, and pelvic u/s WNL. Given rx for ibuprofen and told to follow up as otupatient.    . Anxiety    Pt states she's on cymbalta "to calm me down"  . Cholelithiasis 10/26/2014  . Chronic back pain   . GERD (gastroesophageal reflux disease)    possible PUD; getting workup currently  . Hypertension   . Inappropriate sinus tachycardia 04/21/2017  . Night sweats 03/30/2016  . Pelvic mass 07/13/2018   6.1 x 2.3 cm posterior pelvic cystic mass noted on CT 05/2018.  Marland Kitchen PUD (peptic ulcer disease) 07/13/2018  . Tobacco abuse   . Underweight 10/26/2014     Allergies Allergies  Allergen Reactions  . Mobic [Meloxicam] Other (See Comments)     Stomach bleeding   . Tape Rash    Nicotine patches  . Penicillins Other (See Comments)    uncle highly allergic so was told whole family should not take it Did it involve swelling of the face/tongue/throat, SOB, or low BP? Unknown Did it involve sudden or severe rash/hives, skin peeling, or any reaction on the inside of your mouth or nose? Unknown Did you need to seek medical attention at a hospital or doctor's office? Unknown When did it last happen?      Never taken before If all above answers are "NO", may proceed with cephalosporin use.  Unknown, patient has never taken the medication but states her uncle has an allergy to it.   . Nicotine Rash    Transdermal Patch     Review of Systems Review of Systems  Constitutional: Negative.   HENT: Negative.    Respiratory: Negative.    Cardiovascular: Negative.   Gastrointestinal:  Positive for abdominal pain.      Objective:    Vitals BP 124/74 (BP Location: Left Arm, Patient Position: Sitting, Cuff Size: Normal)   Pulse 63   Temp 98.3 F (36.8 C)   Resp 18   Ht 5\' 3"  (1.6 m)   Wt 128 lb 6 oz (58.2 kg)   LMP 01/17/2021   SpO2 99%   BMI 22.74 kg/m  Physical Examination Physical Exam Constitutional:      Appearance: Normal appearance.  Cardiovascular:     Rate and Rhythm: Normal rate and regular rhythm.     Heart sounds: Normal heart sounds.  Pulmonary:     Effort: Pulmonary effort is normal.     Breath sounds: Normal breath sounds.  Abdominal:     Palpations: Abdomen is soft.     Tenderness: There is abdominal tenderness. There is right CVA tenderness.     Comments: Right lower quadrant pain and tenderness  Neurological:     General: No focal deficit present.     Mental Status: She is alert and oriented to person, place, and time.       Assessment & Plan:   No problem-specific Assessment & Plan notes found for this encounter.    No follow-ups on file.   Eloisa Northern, MD

## 2022-06-30 NOTE — Assessment & Plan Note (Signed)
She has right ovarian cyst, she will discuss with Gynecologist office otherwise may need to do CT scan abdomen

## 2022-06-30 NOTE — Assessment & Plan Note (Signed)
Better with atenolol

## 2022-06-30 NOTE — Assessment & Plan Note (Signed)
She will take B12 1000 mcg daily

## 2022-07-08 LAB — CMP14 + ANION GAP
ALT: 15 IU/L (ref 0–32)
AST: 24 IU/L (ref 0–40)
Albumin/Globulin Ratio: 1.5 (ref 1.2–2.2)
Albumin: 4.1 g/dL (ref 3.9–4.9)
Alkaline Phosphatase: 59 IU/L (ref 44–121)
Anion Gap: 15 mmol/L (ref 10.0–18.0)
BUN/Creatinine Ratio: 8 — ABNORMAL LOW (ref 9–23)
BUN: 7 mg/dL (ref 6–24)
Bilirubin Total: 0.2 mg/dL (ref 0.0–1.2)
CO2: 21 mmol/L (ref 20–29)
Calcium: 9.4 mg/dL (ref 8.7–10.2)
Chloride: 102 mmol/L (ref 96–106)
Creatinine, Ser: 0.84 mg/dL (ref 0.57–1.00)
Globulin, Total: 2.8 g/dL (ref 1.5–4.5)
Glucose: 91 mg/dL (ref 70–99)
Potassium: 4.3 mmol/L (ref 3.5–5.2)
Sodium: 138 mmol/L (ref 134–144)
Total Protein: 6.9 g/dL (ref 6.0–8.5)
eGFR: 89 mL/min/{1.73_m2} (ref >59)

## 2022-07-08 LAB — LIPID PANEL
Chol/HDL Ratio: 3.7 ratio (ref 0.0–4.4)
Cholesterol, Total: 206 mg/dL — ABNORMAL HIGH (ref 100–199)
HDL: 56 mg/dL (ref >39)
LDL Chol Calc (NIH): 127 mg/dL — ABNORMAL HIGH (ref 0–99)
Triglycerides: 127 mg/dL (ref 0–149)
VLDL Cholesterol Cal: 23 mg/dL (ref 5–40)

## 2022-07-14 ENCOUNTER — Other Ambulatory Visit: Payer: Self-pay

## 2022-07-14 ENCOUNTER — Other Ambulatory Visit (HOSPITAL_COMMUNITY)
Admission: RE | Admit: 2022-07-14 | Discharge: 2022-07-14 | Disposition: A | Discharge status: Home / Self Care | Payer: Medicaid Other | Source: Ambulatory Visit | Attending: Obstetrics and Gynecology | Admitting: Obstetrics and Gynecology

## 2022-07-14 ENCOUNTER — Ambulatory Visit (INDEPENDENT_AMBULATORY_CARE_PROVIDER_SITE_OTHER): Payer: Medicaid Other | Admitting: Obstetrics and Gynecology

## 2022-07-14 VITALS — BP 134/85 | HR 70 | Wt 132.1 lb

## 2022-07-14 DIAGNOSIS — N950 Postmenopausal bleeding: Secondary | ICD-10-CM

## 2022-07-14 DIAGNOSIS — N926 Irregular menstruation, unspecified: Secondary | ICD-10-CM | POA: Insufficient documentation

## 2022-07-14 NOTE — Progress Notes (Unsigned)
GYNECOLOGY VISIT  Patient name: Melissa Walker MRN [id number]  Date of birth: 1980/11/30 Chief Complaint:   No chief complaint on file.   History:  Melissa Walker is a 42 y.o. G2P2002 being seen today for abnormal bleeding/postmenopausal bleeding.  Reports here to discuss biopsy.  Had not had bleeding for 2 years and then starting bleeding spontaneously again. Had 2 days of bleeding. Last bleed was about 1 week ago, 1 day, dark red. No itching with the bleeding. Having pain but not correlated with the bleeding. Having RLQ pain as well. Intermittent pain that doesn't last long. Had previously been recommend a possible biopsy. Has not been on any medication. No bleeding after intercourse. No bleeding with BM or when voiding.   Pain has been going on for about 2 weeks  Reprots having pelvic pain that feels liek she is giving birth, lasts for a few seconds. Had CT A/P that didn't show cause of pain  Past Medical History:  Diagnosis Date   Abnormal uterine bleeding 02/04/2012   Occurred 01/12/12-01/15/12. Evaluated in [facility] and all labs, exam, and pelvic u/s WNL. Given rx for ibuprofen and told to follow up as otupatient.     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   PUD (peptic ulcer disease) 07/13/2018   Tobacco abuse    Underweight 10/26/2014    Past Surgical History:  Procedure Laterality Date   DENTAL REHABILITATION     full set dentures   LUMBAR LAMINECTOMY/DECOMPRESSION MICRODISCECTOMY Right 07/23/2014   Procedure: Right Lumbar five-Sacral one laminotomy and microdiskectomy;  Surgeon: Shirlean Kelly, MD;  Location: [facility];  Service: Neurosurgery;   Laterality: Right;  Right Lumbar five-Sacral one laminotomy and microdiskectomy   Multiple epidural steroid injections in back     Over past 2 years   POSTERIOR LAMINECTOMY / DECOMPRESSION LUMBAR SPINE  06/23/2018   L5-S1 decompression    TUBAL LIGATION Bilateral 05/11/2013   Procedure: POST PARTUM TUBAL LIGATION;  Surgeon: Tereso Newcomer, MD;  Location: [facility];  Service: Gynecology;  Laterality: Bilateral;    The following portions of the patient's history were reviewed and updated as appropriate: allergies, current medications, past family history, past medical history, past social history, past surgical history and problem list.   Health Maintenance:   Last pap     Component Value Date/Time   DIAGPAP  07/22/2020 1446    - Negative for Intraepithelial Lesions or Malignancy (NILM)   DIAGPAP - Benign reactive/reparative changes 07/22/2020 1446   HPVHIGH Negative 07/22/2020 1446   ADEQPAP  07/22/2020 1446    Satisfactory for evaluation; transformation zone component PRESENT.    High Risk HPV: Positive  Adequacy:  Satisfactory for evaluation, transformation zone component PRESENT  Diagnosis:  Atypical squamous cells of undetermined significance (ASC-US)  Last mammogram: scheduled for 08/2022  Review of Systems:  Pertinent items are noted in HPI. Comprehensive review of systems was otherwise negative.   Objective:  Physical Exam BP 134/85   Pulse 70   Wt 132 lb 1.6 oz (59.9 kg)   LMP 01/17/2021   BMI 23.40 kg/m  Physical Exam Vitals and nursing note reviewed. Exam conducted with a chaperone present.  Constitutional:      Appearance: Normal appearance.  HENT:     Head: Normocephalic and atraumatic.  Pulmonary:     Effort: Pulmonary effort is normal.     Breath sounds: Normal breath sounds.  Genitourinary:    General: Normal vulva.     Exam position: Lithotomy position.     Vagina: Normal.     Cervix: Normal.  Skin:    General: Skin is warm and dry.   Neurological:     General: No focal deficit present.     Mental Status: She is alert.  Psychiatric:        Mood and Affect: Mood normal.        Behavior: Behavior normal.        Thought Content: Thought content normal.        Judgment: Judgment normal.      Endometrial Biopsy Procedure  Patient identified, informed consent performed,  indication reviewed, consent signed.  Reviewed risk of perforation, pain, bleeding, insufficient sample, etc were reviewd. Time out was performed.  Urine pregnancy test negative.  Speculum placed in the vagina.  Cervix visualized.  Cleaned with Betadine x 2.  Anterior cervix grasped with tenaculum.  Endometrial pipelle was used to draw up 1cc of 1% lidocaine, introduced into the cervical os and instilled into the endometrial cavity.  The pipelle was passed twice without difficulty and sample obtained. Tenaculum was removed, good hemostasis noted.  Patient tolerated procedure well.  Patient was given post-procedure instructions.     Assessment & Plan:   1. Irregular menses 2. Postmenopausal bleeding Will repeat FSH and get estradiol to confirm diagnosis of POF (cessation of menses was prior to age 88). If confirmed will discuss possible HRT. Now s/p uncomplicated EMB as well.     Lorriane Shire, MD Minimally Invasive Gynecologic [facility] for [facility], [facility]

## 2022-07-15 ENCOUNTER — Other Ambulatory Visit: Payer: Self-pay | Admitting: Internal Medicine

## 2022-07-15 LAB — FOLLICLE STIMULATING HORMONE: FSH: 56.4 m[IU]/mL

## 2022-07-15 LAB — ESTRADIOL: Estradiol: 80.8 pg/mL

## 2022-07-16 LAB — SURGICAL PATHOLOGY

## 2022-08-03 ENCOUNTER — Ambulatory Visit
Admission: RE | Admit: 2022-08-03 | Discharge: 2022-08-03 | Disposition: A | Discharge status: Home / Self Care | Payer: Medicaid Other | Source: Ambulatory Visit | Attending: Advanced Practice Midwife | Admitting: Advanced Practice Midwife

## 2022-08-03 DIAGNOSIS — Z1231 Encounter for screening mammogram for malignant neoplasm of breast: Secondary | ICD-10-CM

## 2022-08-11 ENCOUNTER — Other Ambulatory Visit: Payer: Self-pay

## 2022-08-11 ENCOUNTER — Ambulatory Visit (INDEPENDENT_AMBULATORY_CARE_PROVIDER_SITE_OTHER): Payer: Medicaid Other | Admitting: Obstetrics and Gynecology

## 2022-08-11 ENCOUNTER — Encounter: Payer: Self-pay | Admitting: Obstetrics and Gynecology

## 2022-08-11 VITALS — BP 118/65 | HR 83 | Wt 129.2 lb

## 2022-08-11 DIAGNOSIS — N83201 Unspecified ovarian cyst, right side: Secondary | ICD-10-CM

## 2022-08-11 DIAGNOSIS — N950 Postmenopausal bleeding: Secondary | ICD-10-CM | POA: Diagnosis not present

## 2022-08-11 NOTE — Progress Notes (Signed)
GYNECOLOGY VISIT  Patient name: Melissa Walker MRN [id number]  Date of birth: 19-Jan-1981 Chief Complaint:   Follow-up   History:  Melissa Walker is a 42 y.o. G2P2002 being seen today for follow up of PMB. Still having pain and spotting last week    Spotting is separate from the cramping, slightly dark red , no clots and cramping is separate and may last a few hours   No increased HA, vision change - stars in vision, no loss of vision on the sides  Skin changes - acne Weight gain - maybe 20# in 6 months  No changes in bowel habits  No hot flashes No mood changes  Bleeding maybe 1-2 times  Intermittent RLQ pain, last felt this morning while she was asleep Occasional early satiety     Past Medical History:  Diagnosis Date   Abnormal uterine bleeding 02/04/2012   Occurred 01/12/12-01/15/12. Evaluated in [facility] and all labs, exam, and pelvic u/s WNL. Given rx for ibuprofen and told to follow up as otupatient.     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 Overview: Pain contract; hydrocodone-acetaminophen 5-325 q6hr PRN Formatting of this note might be different from the original. Formatting of this note might be different from the original.  Overview:  Pain contract;  hydrocodone-acetaminophen 5-325 q6hr PRN   Pelvic mass 07/13/2018   6.1 x 2.3 cm posterior pelvic cystic mass noted on CT 05/2018.   PUD (peptic ulcer disease) 07/13/2018   Sacroiliac joint pain 11/14/2011   Sacroiliitis, not elsewhere classified (HCC) 11/14/2011   Tobacco abuse    Underweight 10/26/2014    Past Surgical History:  Procedure Laterality Date   DENTAL REHABILITATION     full set dentures   LUMBAR LAMINECTOMY/DECOMPRESSION MICRODISCECTOMY Right 07/23/2014   Procedure: Right Lumbar five-Sacral one laminotomy and microdiskectomy;  Surgeon: Shirlean Kelly, MD;  Location: [facility];  Service: Neurosurgery;  Laterality: Right;  Right Lumbar five-Sacral one laminotomy and microdiskectomy   Multiple epidural steroid injections in back     Over past 2 years   POSTERIOR LAMINECTOMY / DECOMPRESSION LUMBAR SPINE  06/23/2018   L5-S1 decompression    TUBAL LIGATION Bilateral 05/11/2013   Procedure: POST PARTUM TUBAL LIGATION;  Surgeon: Tereso Newcomer, MD;  Location: [facility];  Service: Gynecology;  Laterality: Bilateral;    The following portions of the patient's history were reviewed and updated as appropriate: allergies, current medications, past family history, past medical history, past social history, past surgical history and problem list.   Health Maintenance:   Last pap     Component Value Date/Time   DIAGPAP  07/22/2020 1446    - Negative for Intraepithelial Lesions or Malignancy (NILM)   DIAGPAP - Benign reactive/reparative changes 07/22/2020 1446  HPVHIGH Negative 07/22/2020 1446   ADEQPAP  07/22/2020 1446    Satisfactory for evaluation; transformation zone component PRESENT.    High Risk HPV: Positive  Adequacy:  Satisfactory for evaluation, transformation zone component PRESENT  Diagnosis:  Atypical squamous cells of undetermined significance (ASC-US)  Last mammogram: n/a   Review of Systems:  Pertinent items are noted in HPI. Comprehensive review of  systems was otherwise negative.   Objective:  Physical Exam BP 118/65   Pulse 83   Wt 129 lb 3.2 oz (58.6 kg)   LMP 01/17/2021   BMI 22.89 kg/m    Physical Exam Vitals and nursing note reviewed.  Constitutional:      Appearance: Normal appearance.  HENT:     Head: Normocephalic and atraumatic.  Pulmonary:     Effort: Pulmonary effort is normal.  Abdominal:     Palpations: Abdomen is soft.     Tenderness: There is abdominal tenderness in the right lower quadrant.  Skin:    General: Skin is warm and dry.  Neurological:     General: No focal deficit present.     Mental Status: She is alert.  Psychiatric:        Mood and Affect: Mood normal.        Behavior: Behavior normal.        Thought Content: Thought content normal.        Judgment: Judgment normal.      Labs and Imaging MM 3D SCREENING MAMMOGRAM BILATERAL BREAST  Result Date: 08/04/2022 CLINICAL DATA:  Screening. EXAM: DIGITAL SCREENING BILATERAL MAMMOGRAM WITH TOMOSYNTHESIS AND CAD TECHNIQUE: Bilateral screening digital craniocaudal and mediolateral oblique mammograms were obtained. Bilateral screening digital breast tomosynthesis was performed. The images were evaluated with computer-aided detection. COMPARISON:  None available. ACR Breast Density Category c: The breasts are heterogeneously dense, which may obscure small masses. FINDINGS: There are no findings suspicious for malignancy. IMPRESSION: No mammographic evidence of malignancy. A result letter of this screening mammogram will be mailed directly to the patient. RECOMMENDATION: Screening mammogram in one year. (Code:SM-B-01Y) BI-RADS CATEGORY  1: Negative. Electronically Signed   By: Bary Richard M.D.   On: 08/04/2022 13:07       Assessment & Plan:   1. Right ovarian cyst Itnerval [place] to assess for change in size/characterization especially given intermittent pain and concern for exogenous estrogen - US PELVIC COMPLETE WITH TRANSVAGINAL; Future -  Testosterone,Free and Total - FSH  2. Postmenopausal bleeding Repeat FSH and E2 to confirm levels, additionally PRL and TSH given oligomenorrhea and testosterone given increased acne. May have surge in E2 in the setting of POI. No VSM symptoms and re-evaluate role of HRT in the setting of intermittent vaginal bleeding.  - Prolactin - Estradiol - TSH Rfx on Abnormal to Free T4 - Testosterone,Free and Total - FSH    Lorriane Shire, MD Minimally Invasive Gynecologic Surgery Center for [facility], [facility]

## 2022-08-14 ENCOUNTER — Encounter: Payer: Self-pay | Admitting: Obstetrics and Gynecology

## 2022-08-14 LAB — ESTRADIOL: Estradiol: 100 pg/mL

## 2022-08-14 LAB — FOLLICLE STIMULATING HORMONE: FSH: 44.5 m[IU]/mL

## 2022-08-14 LAB — TSH RFX ON ABNORMAL TO FREE T4: TSH: 0.848 u[IU]/mL (ref 0.450–4.500)

## 2022-08-14 LAB — TESTOSTERONE,FREE AND TOTAL
Testosterone, Free: <0.2 pg/mL (ref 0.0–4.2)
Testosterone: 9 ng/dL (ref 4–50)

## 2022-08-14 LAB — PROLACTIN: Prolactin: 32.7 ng/mL (ref 4.8–33.4)

## 2022-08-24 ENCOUNTER — Ambulatory Visit (HOSPITAL_COMMUNITY)
Admission: RE | Admit: 2022-08-24 | Discharge: 2022-08-24 | Disposition: A | Discharge status: Home / Self Care | Payer: Medicaid Other | Source: Ambulatory Visit | Attending: Obstetrics and Gynecology | Admitting: Obstetrics and Gynecology

## 2022-08-24 DIAGNOSIS — N83201 Unspecified ovarian cyst, right side: Secondary | ICD-10-CM | POA: Insufficient documentation

## 2022-08-27 ENCOUNTER — Encounter (HOSPITAL_BASED_OUTPATIENT_CLINIC_OR_DEPARTMENT_OTHER): Payer: Self-pay

## 2022-08-27 ENCOUNTER — Other Ambulatory Visit: Payer: Self-pay

## 2022-08-27 ENCOUNTER — Emergency Department (HOSPITAL_BASED_OUTPATIENT_CLINIC_OR_DEPARTMENT_OTHER): Payer: Medicaid Other

## 2022-08-27 ENCOUNTER — Emergency Department (HOSPITAL_BASED_OUTPATIENT_CLINIC_OR_DEPARTMENT_OTHER)
Admission: EM | Admit: 2022-08-27 | Discharge: 2022-08-27 | Disposition: A | Discharge status: Home / Self Care | Payer: Medicaid Other | Source: Home / Self Care | Attending: Emergency Medicine | Admitting: Emergency Medicine

## 2022-08-27 ENCOUNTER — Observation Stay (HOSPITAL_COMMUNITY)
Admission: EM | Admit: 2022-08-27 | Discharge: 2022-08-28 | Disposition: A | Discharge status: Home / Self Care | Payer: Medicaid Other | Attending: Surgery | Admitting: Surgery

## 2022-08-27 ENCOUNTER — Emergency Department (HOSPITAL_COMMUNITY): Payer: Medicaid Other

## 2022-08-27 DIAGNOSIS — I10 Essential (primary) hypertension: Secondary | ICD-10-CM | POA: Diagnosis not present

## 2022-08-27 DIAGNOSIS — F17210 Nicotine dependence, cigarettes, uncomplicated: Secondary | ICD-10-CM | POA: Insufficient documentation

## 2022-08-27 DIAGNOSIS — Z79899 Other long term (current) drug therapy: Secondary | ICD-10-CM | POA: Insufficient documentation

## 2022-08-27 DIAGNOSIS — R1011 Right upper quadrant pain: Secondary | ICD-10-CM | POA: Diagnosis present

## 2022-08-27 DIAGNOSIS — K8012 Calculus of gallbladder with acute and chronic cholecystitis without obstruction: Secondary | ICD-10-CM | POA: Diagnosis not present

## 2022-08-27 DIAGNOSIS — K8020 Calculus of gallbladder without cholecystitis without obstruction: Secondary | ICD-10-CM | POA: Insufficient documentation

## 2022-08-27 LAB — CBC WITH DIFFERENTIAL/PLATELET
Abs Immature Granulocytes: 0.04 10*3/uL (ref 0.00–0.07)
Basophils Absolute: 0.1 10*3/uL (ref 0.0–0.1)
Basophils Relative: 1 %
Eosinophils Absolute: 0.2 10*3/uL (ref 0.0–0.5)
Eosinophils Relative: 2 %
HCT: 37.5 % (ref 36.0–46.0)
Hemoglobin: 12.5 g/dL (ref 12.0–15.0)
Immature Granulocytes: 0 %
Lymphocytes Relative: 16 %
Lymphs Abs: 1.8 10*3/uL (ref 0.7–4.0)
MCH: 32.7 pg (ref 26.0–34.0)
MCHC: 33.3 g/dL (ref 30.0–36.0)
MCV: 98.2 fL (ref 80.0–100.0)
Monocytes Absolute: 0.4 10*3/uL (ref 0.1–1.0)
Monocytes Relative: 4 %
Neutro Abs: 8.5 10*3/uL — ABNORMAL HIGH (ref 1.7–7.7)
Neutrophils Relative %: 77 %
Platelets: 380 10*3/uL (ref 150–400)
RBC: 3.82 MIL/uL — ABNORMAL LOW (ref 3.87–5.11)
RDW: 12.2 % (ref 11.5–15.5)
WBC: 11 10*3/uL — ABNORMAL HIGH (ref 4.0–10.5)
nRBC: 0 % (ref 0.0–0.2)

## 2022-08-27 LAB — URINALYSIS, ROUTINE W REFLEX MICROSCOPIC
Bilirubin Urine: NEGATIVE
Glucose, UA: NEGATIVE mg/dL
Hgb urine dipstick: NEGATIVE
Ketones, ur: NEGATIVE mg/dL
Leukocytes,Ua: NEGATIVE
Nitrite: NEGATIVE
Protein, ur: NEGATIVE mg/dL
Specific Gravity, Urine: 1.025 (ref 1.005–1.030)
pH: 6 (ref 5.0–8.0)

## 2022-08-27 LAB — CBC
HCT: 36.7 % (ref 36.0–46.0)
Hemoglobin: 12 g/dL (ref 12.0–15.0)
MCH: 32.5 pg (ref 26.0–34.0)
MCHC: 32.7 g/dL (ref 30.0–36.0)
MCV: 99.5 fL (ref 80.0–100.0)
Platelets: 359 10*3/uL (ref 150–400)
RBC: 3.69 MIL/uL — ABNORMAL LOW (ref 3.87–5.11)
RDW: 12.4 % (ref 11.5–15.5)
WBC: 7.6 10*3/uL (ref 4.0–10.5)
nRBC: 0 % (ref 0.0–0.2)

## 2022-08-27 LAB — COMPREHENSIVE METABOLIC PANEL
ALT: 11 U/L (ref 0–44)
ALT: 13 U/L (ref 0–44)
AST: 11 U/L — ABNORMAL LOW (ref 15–41)
AST: 12 U/L — ABNORMAL LOW (ref 15–41)
Albumin: 3.4 g/dL — ABNORMAL LOW (ref 3.5–5.0)
Albumin: 3.6 g/dL (ref 3.5–5.0)
Alkaline Phosphatase: 60 U/L (ref 38–126)
Alkaline Phosphatase: 62 U/L (ref 38–126)
Anion gap: 8 (ref 5–15)
Anion gap: 6 (ref 5–15)
BUN: 14 mg/dL (ref 6–20)
BUN: 8 mg/dL (ref 6–20)
CO2: 25 mmol/L (ref 22–32)
CO2: 25 mmol/L (ref 22–32)
Calcium: 8.3 mg/dL — ABNORMAL LOW (ref 8.9–10.3)
Calcium: 8.8 mg/dL — ABNORMAL LOW (ref 8.9–10.3)
Chloride: 103 mmol/L (ref 98–111)
Chloride: 105 mmol/L (ref 98–111)
Creatinine, Ser: 0.81 mg/dL (ref 0.44–1.00)
Creatinine, Ser: 0.8 mg/dL (ref 0.44–1.00)
GFR, Estimated: >60 mL/min (ref >60)
GFR, Estimated: >60 mL/min (ref >60)
Glucose, Bld: 106 mg/dL — ABNORMAL HIGH (ref 70–99)
Glucose, Bld: 109 mg/dL — ABNORMAL HIGH (ref 70–99)
Potassium: 4.3 mmol/L (ref 3.5–5.1)
Potassium: 3.7 mmol/L (ref 3.5–5.1)
Sodium: 136 mmol/L (ref 135–145)
Sodium: 136 mmol/L (ref 135–145)
Total Bilirubin: 0.5 mg/dL (ref 0.3–1.2)
Total Bilirubin: 0.5 mg/dL (ref 0.3–1.2)
Total Protein: 6.8 g/dL (ref 6.5–8.1)
Total Protein: 6.9 g/dL (ref 6.5–8.1)

## 2022-08-27 LAB — LIPASE, BLOOD: Lipase: 31 U/L (ref 11–51)

## 2022-08-27 LAB — PREGNANCY, URINE: Preg Test, Ur: NEGATIVE

## 2022-08-27 MED ORDER — ONDANSETRON HCL 4 MG/2ML IJ SOLN
4.0000 mg | Freq: Once | INTRAMUSCULAR | Status: AC
  Administered 2022-08-27: 4 mg via INTRAVENOUS
  Filled 2022-08-27: qty 2

## 2022-08-27 MED ORDER — OXYCODONE HCL 5 MG PO TABS
5.0000 mg | ORAL_TABLET | ORAL | Status: DC | PRN
  Administered 2022-08-28 (×2): 5 mg via ORAL
  Filled 2022-08-27 (×2): qty 1

## 2022-08-27 MED ORDER — FAMOTIDINE 20 MG PO TABS: 20.0000 mg | ORAL_TABLET | Freq: Every day | ORAL | Status: DC | PRN

## 2022-08-27 MED ORDER — FENTANYL CITRATE PF 50 MCG/ML IJ SOSY
50.0000 ug | PREFILLED_SYRINGE | Freq: Once | INTRAMUSCULAR | Status: AC
  Administered 2022-08-27: 50 ug via INTRAVENOUS
  Filled 2022-08-27: qty 1

## 2022-08-27 MED ORDER — METHOCARBAMOL 1000 MG/10ML IJ SOLN: 500.0000 mg | Freq: Three times a day (TID) | INTRAMUSCULAR | Status: DC | PRN

## 2022-08-27 MED ORDER — ONDANSETRON HCL 4 MG/2ML IJ SOLN
4.0000 mg | Freq: Four times a day (QID) | INTRAMUSCULAR | Status: DC | PRN
  Administered 2022-08-28 (×2): 4 mg via INTRAVENOUS
  Filled 2022-08-27 (×2): qty 2

## 2022-08-27 MED ORDER — LACTATED RINGERS IV SOLN
INTRAVENOUS | Status: DC
  Administered 2022-08-28: 1000 mL via INTRAVENOUS

## 2022-08-27 MED ORDER — METHOCARBAMOL 500 MG PO TABS: 500.0000 mg | ORAL_TABLET | Freq: Three times a day (TID) | ORAL | Status: DC | PRN

## 2022-08-27 MED ORDER — SODIUM CHLORIDE 0.9 % IV BOLUS
1000.0000 mL | Freq: Once | INTRAVENOUS | Status: AC
  Administered 2022-08-27: 1000 mL via INTRAVENOUS

## 2022-08-27 MED ORDER — DULOXETINE HCL 60 MG PO CPEP
60.0000 mg | ORAL_CAPSULE | Freq: Every day | ORAL | Status: DC
  Administered 2022-08-28: 60 mg via ORAL
  Filled 2022-08-27: qty 1

## 2022-08-27 MED ORDER — MORPHINE SULFATE (PF) 4 MG/ML IV SOLN
4.0000 mg | Freq: Once | INTRAVENOUS | Status: AC
  Administered 2022-08-27: 4 mg via INTRAVENOUS
  Filled 2022-08-27: qty 1

## 2022-08-27 MED ORDER — DIPHENHYDRAMINE HCL 50 MG/ML IJ SOLN: 25.0000 mg | Freq: Four times a day (QID) | INTRAMUSCULAR | Status: DC | PRN

## 2022-08-27 MED ORDER — ENOXAPARIN SODIUM 40 MG/0.4ML IJ SOSY
40.0000 mg | PREFILLED_SYRINGE | INTRAMUSCULAR | Status: DC
  Administered 2022-08-28: 40 mg via SUBCUTANEOUS
  Filled 2022-08-27: qty 0.4

## 2022-08-27 MED ORDER — HYDROCODONE-ACETAMINOPHEN 5-325 MG PO TABS
2.0000 | ORAL_TABLET | Freq: Once | ORAL | Status: AC
  Administered 2022-08-27: 2 via ORAL
  Filled 2022-08-27: qty 2

## 2022-08-27 MED ORDER — DIPHENHYDRAMINE HCL 25 MG PO CAPS: 25.0000 mg | ORAL_CAPSULE | Freq: Four times a day (QID) | ORAL | Status: DC | PRN

## 2022-08-27 MED ORDER — TRAZODONE HCL 100 MG PO TABS
100.0000 mg | ORAL_TABLET | Freq: Every evening | ORAL | Status: DC | PRN
  Administered 2022-08-28: 100 mg via ORAL
  Filled 2022-08-27: qty 1

## 2022-08-27 MED ORDER — IOHEXOL 300 MG/ML  SOLN
100.0000 mL | Freq: Once | INTRAMUSCULAR | Status: AC | PRN
  Administered 2022-08-27: 100 mL via INTRAVENOUS

## 2022-08-27 MED ORDER — CYCLOBENZAPRINE HCL 5 MG PO TABS: 7.5000 mg | ORAL_TABLET | Freq: Two times a day (BID) | ORAL | Status: DC | PRN

## 2022-08-27 MED ORDER — ACETAMINOPHEN 500 MG PO TABS
1000.0000 mg | ORAL_TABLET | Freq: Four times a day (QID) | ORAL | Status: DC
  Administered 2022-08-28: 1000 mg via ORAL
  Filled 2022-08-27: qty 2

## 2022-08-27 MED ORDER — MELATONIN 3 MG PO TABS: 3.0000 mg | ORAL_TABLET | Freq: Every evening | ORAL | Status: DC | PRN

## 2022-08-27 MED ORDER — MORPHINE SULFATE (PF) 2 MG/ML IV SOLN: 2.0000 mg | INTRAVENOUS | Status: DC | PRN

## 2022-08-27 MED ORDER — MORPHINE SULFATE (PF) 4 MG/ML IV SOLN
4.0000 mg | INTRAVENOUS | Status: DC | PRN
  Administered 2022-08-28 (×2): 4 mg via INTRAVENOUS
  Filled 2022-08-27 (×2): qty 1

## 2022-08-27 MED ORDER — ONDANSETRON 4 MG PO TBDP: 4.0000 mg | ORAL_TABLET | Freq: Four times a day (QID) | ORAL | Status: DC | PRN

## 2022-08-27 MED ORDER — ATENOLOL 25 MG PO TABS
25.0000 mg | ORAL_TABLET | Freq: Every day | ORAL | Status: DC
  Administered 2022-08-28: 25 mg via ORAL
  Filled 2022-08-27 (×2): qty 1

## 2022-08-27 NOTE — ED Provider Notes (Signed)
[place] EMERGENCY DEPARTMENT AT [facility] Provider Note   CSN: [id number] Arrival date & time: 08/27/22  1947     History  Chief Complaint  Patient presents with   Cholelithiasis    Melissa Walker is a 42 y.o. female.  Patient is a 41 year old female who presents with right upper quadrant abdominal pain.  She was here during the night last night for the same pain and was diagnosed with gallstones on CT scan.  No evidence of cholecystitis.  She says she is continue to have pain.  She has nausea and vomiting.  She was not given a prescription for pain medication from the ED given that she is on a pain contract.  She denies any recent fevers.       Home Medications Prior to Admission medications   Medication Sig Start Date End Date Taking? Authorizing Provider  atenolol (TENORMIN) 25 MG tablet Take 1 tablet (25 mg total) by mouth daily. 03/31/22   Eloisa Northern, MD  cyclobenzaprine (FEXMID) 7.5 MG tablet Take 1 tablet (7.5 mg total) by mouth 2 (two) times daily between meals as needed for muscle spasms. 03/31/22   Eloisa Northern, MD  diclofenac (VOLTAREN) 75 MG EC tablet Take 1 tablet (75 mg total) by mouth 2 (two) times daily. 06/05/22   Eloisa Northern, MD  DULoxetine (CYMBALTA) 60 MG capsule TAKE 1 CAPSULE(60 MG) BY MOUTH DAILY 03/18/21   Verdene Lennert, MD  famotidine (PEPCID) 20 MG tablet Take 1 tablet (20 mg total) by mouth daily as needed for heartburn or indigestion. 01/08/20   Verdene Lennert, MD  fluticasone (FLONASE) 50 MCG/ACT nasal spray Place 1 spray into both nostrils daily. 03/22/18 03/31/22  Nyra Market, MD  gabapentin (NEURONTIN) 300 MG capsule Take 300 mg by mouth 2 (two) times daily.    [provider]  HYDROcodone-acetaminophen (NORCO) 5-325 MG tablet Take 1 tablet by mouth every 8 (eight) hours as needed for moderate pain or severe pain. 05/09/21   Verdene Lennert, MD  hydrOXYzine (ATARAX) 25 MG tablet Take 1 tablet (25 mg total) by mouth every 8  (eight) hours as needed for itching. 05/15/21   Verdene Lennert, MD  lidocaine (LIDODERM) 5 % Place 1 patch onto the skin as needed. Remove & Discard patch within 12 hours or as directed by MD 04/09/22   Eloisa Northern, MD  losartan (COZAAR) 50 MG tablet Take 1 tablet (50 mg total) by mouth daily. 03/31/22   Eloisa Northern, MD  norethindrone (MICRONOR) 0.35 MG tablet Take 1 tablet (0.35 mg total) by mouth daily. 07/22/20   Federico Flake, MD  traZODone (DESYREL) 100 MG tablet TAKE 1 TABLET(100 MG) BY MOUTH AT BEDTIME 07/15/22   Eloisa Northern, MD  triamcinolone cream (KENALOG) 0.5 % Apply 1 application topically 2 (two) times daily. 05/06/20   Evlyn Kanner, MD      Allergies    Mobic [meloxicam], Tape, Wound dressing adhesive, Penicillins, and Nicotine    Review of Systems   Review of Systems  Constitutional:  Negative for chills, diaphoresis, fatigue and fever.  HENT:  Negative for congestion, rhinorrhea and sneezing.   Eyes: Negative.   Respiratory:  Negative for cough, chest tightness and shortness of breath.   Cardiovascular:  Negative for chest pain and leg swelling.  Gastrointestinal:  Positive for abdominal pain, nausea and vomiting. Negative for blood in stool and diarrhea.  Genitourinary:  Negative for difficulty urinating, flank pain, frequency and hematuria.  Musculoskeletal:  Negative for arthralgias and  back pain.  Skin:  Negative for rash.  Neurological:  Negative for dizziness, speech difficulty, weakness, numbness and headaches.    Physical Exam Updated Vital Signs BP (!) 175/97 (BP Location: Left Arm)   Pulse 62   Temp 98.4 F (36.9 C) (Oral)   Resp 18   LMP 01/17/2021   SpO2 98%  Physical Exam Constitutional:      Appearance: She is well-developed.  HENT:     Head: Normocephalic and atraumatic.  Eyes:     Pupils: Pupils are equal, round, and reactive to light.  Cardiovascular:     Rate and Rhythm: Normal rate and regular rhythm.     Heart sounds: Normal heart  sounds.  Pulmonary:     Effort: Pulmonary effort is normal. No respiratory distress.     Breath sounds: Normal breath sounds. No wheezing or rales.  Chest:     Chest wall: No tenderness.  Abdominal:     General: Bowel sounds are normal.     Palpations: Abdomen is soft.     Tenderness: There is abdominal tenderness (Tenderness to the right upper quadrant). There is no guarding or rebound.  Musculoskeletal:        General: Normal range of motion.     Cervical back: Normal range of motion and neck supple.  Lymphadenopathy:     Cervical: No cervical adenopathy.  Skin:    General: Skin is warm and dry.     Findings: No rash.  Neurological:     Mental Status: She is alert and oriented to person, place, and time.     ED Results / Procedures / Treatments   Labs (all labs ordered are listed, but only abnormal results are displayed) Labs Reviewed  COMPREHENSIVE METABOLIC PANEL - Abnormal; Notable for the following components:      Result Value   Glucose, Bld 109 (*)    Calcium 8.8 (*)    AST 12 (*)    All other components within normal limits  CBC WITH DIFFERENTIAL/PLATELET - Abnormal; Notable for the following components:   WBC 11.0 (*)    RBC 3.82 (*)    Neutro Abs 8.5 (*)    All other components within normal limits    EKG None  Radiology US Abdomen Limited RUQ (LIVER/GB)  Result Date: 08/27/2022 CLINICAL DATA:  Right upper quadrant pain EXAM: ULTRASOUND ABDOMEN LIMITED RIGHT UPPER QUADRANT COMPARISON:  08/27/2022 FINDINGS: Gallbladder: Large shadowing gallstone is identified measuring up to 2.3 cm. No gallbladder wall thickening or pericholecystic fluid. Negative sonographic Murphy sign. Common bile duct: Diameter: 4 mm Liver: No focal lesion identified. Within normal limits in parenchymal echogenicity. Portal vein is patent on color Doppler imaging with normal direction of blood flow towards the liver. Other: None. IMPRESSION: 1. Cholelithiasis without evidence of acute  cholecystitis. Electronically Signed   By: Sharlet Salina M.D.   On: 08/27/2022 21:39   CT ABDOMEN PELVIS W CONTRAST  Result Date: 08/27/2022 CLINICAL DATA:  42 year old female with history of acute onset of nonlocalized abdominal pain. EXAM: CT ABDOMEN AND PELVIS WITH CONTRAST TECHNIQUE: Multidetector CT imaging of the abdomen and pelvis was performed using the standard protocol following bolus administration of intravenous contrast. RADIATION DOSE REDUCTION: This exam was performed according to the departmental dose-optimization program which includes automated exposure control, adjustment of the mA and/or kV according to patient size and/or use of iterative reconstruction technique. CONTRAST:  OMNIPAQUE IOHEXOL 300 MG/ML  SOLN COMPARISON:  CT of the abdomen and pelvis 06/30/2022.  FINDINGS: Lower chest: Small hiatal hernia. Hepatobiliary: Tiny subcentimeter low-attenuation lesion in the inferior aspect of segment 5 of the liver (axial image 28 of series 3), too small to definitively characterize, but statistically likely a tiny cyst or biliary hamartoma. No other suspicious appearing hepatic lesions. No intra or extrahepatic biliary ductal dilatation. Common bile duct measures 4 mm in the porta hepatis. There is a noncalcified gallstone in the gallbladder measuring up to 2.7 cm in diameter. Gallbladder is moderately distended. Gallbladder wall thickness appears normal. No pericholecystic fluid or surrounding inflammatory changes. Pancreas: No pancreatic mass. No pancreatic ductal dilatation. No pancreatic or peripancreatic fluid collections or inflammatory changes. Spleen: Unremarkable. Adrenals/Urinary Tract: Bilateral kidneys and bilateral adrenal glands are normal in appearance. No hydroureteronephrosis. Urinary bladder is normal in appearance. Stomach/Bowel: The appearance of the stomach is unremarkable. No pathologic dilatation of small bowel or colon. Normal appendix. Vascular/Lymphatic: No  significant atherosclerotic disease, aneurysm or dissection noted in the abdominal or pelvic vasculature. No lymphadenopathy noted in the abdomen or pelvis. Reproductive: Right-sided tubal ligation clip. In additional ligation clip is noted lying dependently in the low right pelvis in the cul-de-sac, likely dislodged. Uterus and ovaries are otherwise unremarkable in appearance. Other: No significant volume of ascites.  No pneumoperitoneum. Musculoskeletal: Status post PLIF at L5-S1 with interbody cages at L5-S1 interspace. There are no aggressive appearing lytic or blastic lesions noted in the visualized portions of the skeleton. IMPRESSION: 1. No acute findings are noted in the abdomen or pelvis to account for the patient's symptoms. 2. Cholelithiasis without evidence of acute cholecystitis at this time. 3. Status post tubal ligation. The left-sided clip appears likely dislodged, located in the right posterior pelvis. Further outpatient clinical evaluation may be warranted. 4. Small hiatal hernia. Electronically Signed   By: Trudie Reed M.D.   On: 08/27/2022 06:29    Procedures Procedures    Medications Ordered in ED Medications  morphine (PF) 4 MG/ML injection 4 mg (4 mg Intravenous Given 08/27/22 2113)  ondansetron [facility]) injection 4 mg (4 mg Intravenous Given 08/27/22 2113)    ED Course/ Medical Decision Making/ A&P                             Medical Decision Making Amount and/or Complexity of Data Reviewed Labs: ordered. Radiology: ordered.  Risk Prescription drug management.   Patient presents with pain in the right upper quadrant.  Ultrasound was performed today which shows no evidence of cholecystitis but there is gallstones.  Labs are not consistent with biliary obstruction.  She does not have any other indication of infection.  She was given morphine for pain.  I reviewed her labs from last night and she had a normal lipase without evidence of pancreatitis.  She is concerned  about going home because of the ongoing pain.  She request admission for cholecystectomy.  I spoke with Dr. Freida Busman who will admit the patient.  Final Clinical Impression(s) / ED Diagnoses Final diagnoses:  Calculus of gallbladder without cholecystitis without obstruction    Rx / DC Orders ED Discharge Orders     None         Rolan Bucco, MD 08/27/22 2248

## 2022-08-27 NOTE — ED Triage Notes (Addendum)
Pt with RUQ abd pain that radiates into the back. Pt recently seen and had US done for this pain. Pain began June 14th. Pt reports she has hx of ovarian cysts and is being followed closely for this. Pt endorses some nausea; no vomiting, BM issues, or fevers. No urinary sx. Pain is unrelieved with OTC pain meds.

## 2022-08-27 NOTE — ED Provider Notes (Signed)
[place] EMERGENCY DEPARTMENT AT [facility]  Provider Note  CSN: [id number] Arrival date & time: 08/27/22 0431  History Chief Complaint  Patient presents with   Abdominal Pain    Melissa Walker is a 42 y.o. female here for RUQ abdominal pain and nausea, off and on for several weeks. Has had ovarian cysts in the past but has not had menstrual period in over a year. She saw Gyn last week and sent for pelvic US which showed normal ovaries and a thickened endometrium. She also had hormone levels checked with a normal estrogen making menopause unlikely. She is awaiting further investigation for that but tonight reports pain became more intense and so she came to the ED for evaluation. No fevers. Pain not improved with APAP at home. She has not had any urinary or bowel symptoms. Still has gall bladder and appendix.    Home Medications Prior to Admission medications   Medication Sig Start Date End Date Taking? Authorizing Provider  atenolol (TENORMIN) 25 MG tablet Take 1 tablet (25 mg total) by mouth daily. 03/31/22   Eloisa Northern, MD  cyclobenzaprine (FEXMID) 7.5 MG tablet Take 1 tablet (7.5 mg total) by mouth 2 (two) times daily between meals as needed for muscle spasms. 03/31/22   Eloisa Northern, MD  diclofenac (VOLTAREN) 75 MG EC tablet Take 1 tablet (75 mg total) by mouth 2 (two) times daily. 06/05/22   Eloisa Northern, MD  DULoxetine (CYMBALTA) 60 MG capsule TAKE 1 CAPSULE(60 MG) BY MOUTH DAILY 03/18/21   Verdene Lennert, MD  famotidine (PEPCID) 20 MG tablet Take 1 tablet (20 mg total) by mouth daily as needed for heartburn or indigestion. 01/08/20   Verdene Lennert, MD  fluticasone (FLONASE) 50 MCG/ACT nasal spray Place 1 spray into both nostrils daily. 03/22/18 03/31/22  Nyra Market, MD  gabapentin (NEURONTIN) 300 MG capsule Take 300 mg by mouth 2 (two) times daily.    [provider]  HYDROcodone-acetaminophen (NORCO) 5-325 MG tablet Take 1 tablet by mouth every 8 (eight)  hours as needed for moderate pain or severe pain. 05/09/21   Verdene Lennert, MD  hydrOXYzine (ATARAX) 25 MG tablet Take 1 tablet (25 mg total) by mouth every 8 (eight) hours as needed for itching. 05/15/21   Verdene Lennert, MD  lidocaine (LIDODERM) 5 % Place 1 patch onto the skin as needed. Remove & Discard patch within 12 hours or as directed by MD 04/09/22   Eloisa Northern, MD  losartan (COZAAR) 50 MG tablet Take 1 tablet (50 mg total) by mouth daily. 03/31/22   Eloisa Northern, MD  norethindrone (MICRONOR) 0.35 MG tablet Take 1 tablet (0.35 mg total) by mouth daily. 07/22/20   Federico Flake, MD  traZODone (DESYREL) 100 MG tablet TAKE 1 TABLET(100 MG) BY MOUTH AT BEDTIME 07/15/22   Eloisa Northern, MD  triamcinolone cream (KENALOG) 0.5 % Apply 1 application topically 2 (two) times daily. 05/06/20   Evlyn Kanner, MD     Allergies    Mobic [meloxicam], Tape, Wound dressing adhesive, Penicillins, and Nicotine   Review of Systems   Review of Systems Please see HPI for pertinent positives and negatives  Physical Exam BP (!) 162/91   Pulse 63   Temp 97.8 F (36.6 C) (Oral)   Resp 18   Ht 5\' 3"  (1.6 m)   Wt 58.6 kg   LMP 01/17/2021   SpO2 100%   BMI 22.88 kg/m   Physical Exam Vitals and nursing note reviewed.  Constitutional:      Appearance: Normal appearance.  HENT:     Head: Normocephalic and atraumatic.     Nose: Nose normal.     Mouth/Throat:     Mouth: Mucous membranes are moist.  Eyes:     Extraocular Movements: Extraocular movements intact.     Conjunctiva/sclera: Conjunctivae normal.  Cardiovascular:     Rate and Rhythm: Normal rate.  Pulmonary:     Effort: Pulmonary effort is normal.     Breath sounds: Normal breath sounds.  Abdominal:     General: Abdomen is flat.     Palpations: Abdomen is soft.     Tenderness: There is abdominal tenderness in the right upper quadrant and epigastric area. There is no guarding. Negative signs include Murphy's sign and McBurney's  sign.  Musculoskeletal:        General: No swelling. Normal range of motion.     Cervical back: Neck supple.  Skin:    General: Skin is warm and dry.  Neurological:     General: No focal deficit present.     Mental Status: She is alert.  Psychiatric:        Mood and Affect: Mood normal.     ED Results / Procedures / Treatments   EKG None  Procedures Procedures  Medications Ordered in the ED Medications  HYDROcodone-acetaminophen (NORCO/VICODIN) 5-325 MG per tablet 2 tablet (has no administration in time range)  fentaNYL (SUBLIMAZE) injection 50 mcg (50 mcg Intravenous Given 08/27/22 0508)  ondansetron (ZOFRAN) injection 4 mg (4 mg Intravenous Given 08/27/22 0506)  sodium chloride 0.9 % bolus 1,000 mL (0 mLs Intravenous Stopped 08/27/22 0609)  iohexol (OMNIPAQUE) 300 MG/ML solution 100 mL (100 mLs Intravenous Contrast Given 08/27/22 0612)    Initial Impression and Plan  Patient here with RUQ pain, recent Gyn workup has been unrevealing. Concern for biliary etiology. Less likely appendicitis given timing. Will check labs, send for CT. Pain/nausea meds for comfort.   ED Course   Clinical Course as of 08/27/22 0643  Thu Aug 27, 2022  0518 CBC is normal [CS]  0532 CMP and lipase are unremarkable.  [CS]  0630 UA is neg. HCG is neg.  [CS]  [id number] I personally viewed the images from radiology studies and agree with radiologist interpretation:  CT shows gall stones without cholecystitis. Patient reports improved pain. She has pain contract getting monthly hydrocodone. Patient aware of incidental pelvic findings.  Plan discharge with Gen Surg follow up for gall stones. Gyn follow up for further management of her pelvic symptoms. RTED for any other concerns.  [CS]    Clinical Course User Index [CS] Pollyann Savoy, MD     MDM Rules/Calculators/A&P Medical Decision Making Problems Addressed: Merri Ray stones: acute illness or injury  Amount and/or Complexity of Data Reviewed Labs:  ordered. Decision-making details documented in ED Course. Radiology: ordered and independent interpretation performed. Decision-making details documented in ED Course.  Risk Prescription drug management.     Final Clinical Impression(s) / ED Diagnoses Final diagnoses:  Gall stones    Rx / DC Orders ED Discharge Orders     None        Pollyann Savoy, MD 08/27/22 [phone]

## 2022-08-27 NOTE — ED Triage Notes (Signed)
Patient arrived via [facility], diagnosed with gallstones yesterday but states continue having NV and pain.  Zofran given en route prior to arrival.

## 2022-08-28 ENCOUNTER — Encounter (HOSPITAL_COMMUNITY): Payer: Self-pay | Admitting: Surgery

## 2022-08-28 ENCOUNTER — Encounter (HOSPITAL_COMMUNITY)
Admission: EM | Disposition: A | Discharge status: Home / Self Care | Payer: Self-pay | Source: Home / Self Care | Attending: Emergency Medicine

## 2022-08-28 ENCOUNTER — Other Ambulatory Visit: Payer: Self-pay

## 2022-08-28 ENCOUNTER — Observation Stay (HOSPITAL_BASED_OUTPATIENT_CLINIC_OR_DEPARTMENT_OTHER): Payer: Medicaid Other | Admitting: Anesthesiology

## 2022-08-28 ENCOUNTER — Observation Stay (HOSPITAL_COMMUNITY): Payer: Medicaid Other | Admitting: Anesthesiology

## 2022-08-28 DIAGNOSIS — F419 Anxiety disorder, unspecified: Secondary | ICD-10-CM

## 2022-08-28 DIAGNOSIS — I10 Essential (primary) hypertension: Secondary | ICD-10-CM | POA: Diagnosis not present

## 2022-08-28 DIAGNOSIS — F17210 Nicotine dependence, cigarettes, uncomplicated: Secondary | ICD-10-CM | POA: Diagnosis not present

## 2022-08-28 DIAGNOSIS — K8010 Calculus of gallbladder with chronic cholecystitis without obstruction: Secondary | ICD-10-CM | POA: Diagnosis not present

## 2022-08-28 HISTORY — PX: CHOLECYSTECTOMY: SHX55

## 2022-08-28 LAB — SURGICAL PCR SCREEN
MRSA, PCR: NEGATIVE
Staphylococcus aureus: POSITIVE — AB

## 2022-08-28 LAB — HIV ANTIBODY (ROUTINE TESTING W REFLEX): HIV Screen 4th Generation wRfx: NONREACTIVE

## 2022-08-28 SURGERY — LAPAROSCOPIC CHOLECYSTECTOMY
Anesthesia: General | Site: Abdomen

## 2022-08-28 MED ORDER — DOCUSATE SODIUM 100 MG PO CAPS: 100.0000 mg | ORAL_CAPSULE | Freq: Two times a day (BID) | ORAL | 2 refills | Status: DC

## 2022-08-28 MED ORDER — METHOCARBAMOL 750 MG PO TABS: 750.0000 mg | ORAL_TABLET | Freq: Four times a day (QID) | ORAL | 1 refills | Status: AC

## 2022-08-28 MED ORDER — AMISULPRIDE (ANTIEMETIC) 5 MG/2ML IV SOLN: 10.0000 mg | Freq: Once | INTRAVENOUS | Status: DC | PRN

## 2022-08-28 MED ORDER — LIDOCAINE HCL (CARDIAC) PF 100 MG/5ML IV SOSY
PREFILLED_SYRINGE | INTRAVENOUS | Status: DC | PRN
  Administered 2022-08-28: 60 mg via INTRAVENOUS

## 2022-08-28 MED ORDER — FENTANYL CITRATE (PF) 250 MCG/5ML IJ SOLN
INTRAMUSCULAR | Status: AC
  Filled 2022-08-28: qty 5

## 2022-08-28 MED ORDER — ACETAMINOPHEN 500 MG PO TABS: 1000.0000 mg | ORAL_TABLET | Freq: Four times a day (QID) | ORAL | 3 refills | Status: AC

## 2022-08-28 MED ORDER — BUPIVACAINE HCL 0.25 % IJ SOLN
INTRAMUSCULAR | Status: AC
  Filled 2022-08-28: qty 1

## 2022-08-28 MED ORDER — MIDAZOLAM HCL 5 MG/5ML IJ SOLN
INTRAMUSCULAR | Status: DC | PRN
  Administered 2022-08-28: 2 mg via INTRAVENOUS

## 2022-08-28 MED ORDER — MUPIROCIN 2 % EX OINT
1.0000 | TOPICAL_OINTMENT | Freq: Two times a day (BID) | CUTANEOUS | Status: DC
  Administered 2022-08-28: 1 via NASAL
  Filled 2022-08-28: qty 22

## 2022-08-28 MED ORDER — DEXAMETHASONE SODIUM PHOSPHATE 10 MG/ML IJ SOLN
INTRAMUSCULAR | Status: AC
  Filled 2022-08-28: qty 1

## 2022-08-28 MED ORDER — MIDAZOLAM HCL 2 MG/2ML IJ SOLN
INTRAMUSCULAR | Status: AC
  Filled 2022-08-28: qty 2

## 2022-08-28 MED ORDER — ROCURONIUM BROMIDE 10 MG/ML (PF) SYRINGE
PREFILLED_SYRINGE | INTRAVENOUS | Status: AC
  Filled 2022-08-28: qty 10

## 2022-08-28 MED ORDER — ONDANSETRON HCL 4 MG/2ML IJ SOLN
INTRAMUSCULAR | Status: DC | PRN
  Administered 2022-08-28: 4 mg via INTRAVENOUS

## 2022-08-28 MED ORDER — BUPIVACAINE LIPOSOME 1.3 % IJ SUSP
INTRAMUSCULAR | Status: AC
  Filled 2022-08-28: qty 20

## 2022-08-28 MED ORDER — FENTANYL CITRATE PF 50 MCG/ML IJ SOSY: 25.0000 ug | PREFILLED_SYRINGE | INTRAMUSCULAR | Status: DC | PRN

## 2022-08-28 MED ORDER — BUPIVACAINE LIPOSOME 1.3 % IJ SUSP
INTRAMUSCULAR | Status: DC | PRN
  Administered 2022-08-28: 20 mL

## 2022-08-28 MED ORDER — DEXAMETHASONE SODIUM PHOSPHATE 10 MG/ML IJ SOLN
INTRAMUSCULAR | Status: DC | PRN
  Administered 2022-08-28: 6 mg via INTRAVENOUS

## 2022-08-28 MED ORDER — FENTANYL CITRATE (PF) 100 MCG/2ML IJ SOLN
INTRAMUSCULAR | Status: DC | PRN
  Administered 2022-08-28 (×2): 100 ug via INTRAVENOUS

## 2022-08-28 MED ORDER — BUPIVACAINE-EPINEPHRINE 0.25% -1:200000 IJ SOLN
INTRAMUSCULAR | Status: DC | PRN
  Administered 2022-08-28: 20 mL

## 2022-08-28 MED ORDER — OXYCODONE HCL 5 MG PO TABS: 5.0000 mg | ORAL_TABLET | ORAL | 0 refills | Status: AC | PRN

## 2022-08-28 MED ORDER — CHLORHEXIDINE GLUCONATE 0.12 % MT SOLN
15.0000 mL | Freq: Once | OROMUCOSAL | Status: AC
  Administered 2022-08-28: 15 mL via OROMUCOSAL

## 2022-08-28 MED ORDER — SUGAMMADEX SODIUM 500 MG/5ML IV SOLN
INTRAVENOUS | Status: DC | PRN
  Administered 2022-08-28: 200 mg via INTRAVENOUS

## 2022-08-28 MED ORDER — PROPOFOL 10 MG/ML IV BOLUS
INTRAVENOUS | Status: DC | PRN
  Administered 2022-08-28: 140 mg via INTRAVENOUS

## 2022-08-28 MED ORDER — ONDANSETRON HCL 4 MG/2ML IJ SOLN
INTRAMUSCULAR | Status: AC
  Filled 2022-08-28: qty 2

## 2022-08-28 MED ORDER — LIDOCAINE HCL (PF) 2 % IJ SOLN
INTRAMUSCULAR | Status: AC
  Filled 2022-08-28: qty 5

## 2022-08-28 MED ORDER — PROPOFOL 10 MG/ML IV BOLUS
INTRAVENOUS | Status: AC
  Filled 2022-08-28: qty 20

## 2022-08-28 MED ORDER — ROCURONIUM BROMIDE 100 MG/10ML IV SOLN
INTRAVENOUS | Status: DC | PRN
  Administered 2022-08-28: 50 mg via INTRAVENOUS

## 2022-08-28 MED ORDER — IBUPROFEN 600 MG PO TABS: 600.0000 mg | ORAL_TABLET | Freq: Four times a day (QID) | ORAL | 1 refills | Status: DC

## 2022-08-28 MED ORDER — ACETAMINOPHEN 500 MG PO TABS
1000.0000 mg | ORAL_TABLET | Freq: Once | ORAL | Status: AC
  Administered 2022-08-28: 1000 mg via ORAL
  Filled 2022-08-28: qty 2

## 2022-08-28 SURGICAL SUPPLY — 47 items
ADH SKN CLS APL DERMABOND .7 (GAUZE/BANDAGES/DRESSINGS) ×1
APL PRP STRL LF DISP 70% ISPRP (MISCELLANEOUS) ×1
APPLIER CLIP 5 13 M/L LIGAMAX5 (MISCELLANEOUS) ×1 IMPLANT
APR CLP MED LRG 5 ANG JAW (MISCELLANEOUS) ×1
BAG SPEC RTRVL 10 TROC 200 (ENDOMECHANICALS) ×1
BLADE CLIPPER SURG (BLADE) IMPLANT
CABLE HIGH FREQUENCY MONO STRZ (ELECTRODE) ×1 IMPLANT
CHLORAPREP W/TINT 26 (MISCELLANEOUS) ×1 IMPLANT
CLIP APPLIE 5 13 M/L LIGAMAX5 (MISCELLANEOUS) ×1 IMPLANT
COVER MAYO STAND XLG (MISCELLANEOUS) IMPLANT
COVER SURGICAL LIGHT HANDLE (MISCELLANEOUS) ×1 IMPLANT
DERMABOND ADVANCED .7 DNX12 (GAUZE/BANDAGES/DRESSINGS) ×1 IMPLANT
DISSECTOR BLUNT TIP ENDO 5MM (MISCELLANEOUS) IMPLANT
DRAPE C-ARM 42X120 X-RAY (DRAPES) IMPLANT
ELECT PENCIL ROCKER SW 15FT (MISCELLANEOUS) ×1 IMPLANT
ELECT REM PT RETURN 15FT ADLT (MISCELLANEOUS) ×1 IMPLANT
ENDOLOOP SUT PDS II 0 18 (SUTURE) IMPLANT
GLOVE BIO SURGEON STRL SZ 6.5 (GLOVE) ×1 IMPLANT
GLOVE BIOGEL PI IND STRL 6 (GLOVE) ×1 IMPLANT
GOWN STRL REUS W/ TWL LRG LVL3 (GOWN DISPOSABLE) ×1 IMPLANT
GOWN STRL REUS W/ TWL XL LVL3 (GOWN DISPOSABLE) IMPLANT
GOWN STRL REUS W/TWL LRG LVL3 (GOWN DISPOSABLE) ×1
GOWN STRL REUS W/TWL XL LVL3 (GOWN DISPOSABLE)
IRRIG SUCT STRYKERFLOW 2 WTIP (MISCELLANEOUS) IMPLANT
IRRIGATION SUCT STRKRFLW 2 WTP (MISCELLANEOUS) IMPLANT
KIT BASIN OR (CUSTOM PROCEDURE TRAY) ×1 IMPLANT
L-HOOK LAP DISP 36CM (ELECTROSURGICAL) IMPLANT
LHOOK LAP DISP 36CM (ELECTROSURGICAL) IMPLANT
NDL INSUFFLATION 14GA 120MM (NEEDLE) IMPLANT
NEEDLE INSUFFLATION 14GA 120MM (NEEDLE) IMPLANT
NS IRRIG 1000ML POUR BTL (IV SOLUTION) ×1 IMPLANT
PAD ARMBOARD 7.5X6 YLW CONV (MISCELLANEOUS) ×1 IMPLANT
POUCH RETRIEVAL ECOSAC 10 (ENDOMECHANICALS) ×1 IMPLANT
SCISSORS LAP 5X35 DISP (ENDOMECHANICALS) ×1 IMPLANT
SET CHOLANGIOGRAPH MIX (MISCELLANEOUS) IMPLANT
SET TUBE SMOKE EVAC HIGH FLOW (TUBING) ×1 IMPLANT
SLEEVE ADV FIXATION 5X100MM (TROCAR) ×2 IMPLANT
SUT MNCRL AB 4-0 PS2 18 (SUTURE) ×1 IMPLANT
SUT VIC AB 0 UR5 27 (SUTURE) IMPLANT
SUT VICRYL 0 UR6 27IN ABS (SUTURE) IMPLANT
SYS BAG RETRIEVAL 10MM (BASKET) IMPLANT
SYSTEM BAG RETRIEVAL 10MM (BASKET) IMPLANT
TRAY LAPAROSCOPIC (CUSTOM PROCEDURE TRAY) ×1 IMPLANT
TROCAR ADV FIXATION 5X100MM (TROCAR) ×1 IMPLANT
TROCAR BALLN 12MMX100 BLUNT (TROCAR) ×1 IMPLANT
TROCAR Z-THREAD FIOS 5X100MM (TROCAR) IMPLANT
WATER STERILE IRR 1000ML POUR (IV SOLUTION) ×1 IMPLANT

## 2022-08-28 NOTE — Progress Notes (Signed)
Patient seen and examined. Imaging and labs reviewed. Plan lap chole. Informed consent was obtained after detailed explanation of risks, including bleeding, infection, biloma, hematoma, injury to common bile duct, need for IOC to delineate anatomy, and need for conversion to open procedure. All questions answered to the patient's satisfaction.  Diamantina Monks, MD General and Trauma Surgery [facility]

## 2022-08-28 NOTE — Anesthesia Procedure Notes (Signed)
Procedure Name: Intubation Date/Time: 08/28/2022 1:37 PM  Performed by: Shanon Payor, CRNAPre-anesthesia Checklist: Patient identified, Emergency Drugs available, Suction available, Patient being monitored and Timeout performed Patient Re-evaluated:Patient Re-evaluated prior to induction Oxygen Delivery Method: Circle system utilized Preoxygenation: Pre-oxygenation with 100% oxygen Induction Type: IV induction Ventilation: Oral airway inserted - appropriate to patient size Laryngoscope Size: Mac and 3 Grade View: Grade I Tube type: Oral Tube size: 7.0 mm Number of attempts: 1 Airway Equipment and Method: Stylet Placement Confirmation: ETT inserted through vocal cords under direct vision, positive ETCO2, CO2 detector and breath sounds checked- equal and bilateral Secured at: 22 cm Tube secured with: Tape Dental Injury: Teeth and Oropharynx as per pre-operative assessment

## 2022-08-28 NOTE — H&P (Signed)
Cartina Prothero Chase Picket [birth year]  [id number].     HPI:  Ms. Brucks is a 42 yo female who presented to the ED overnight with RUQ abdominal pain, nausea, and vomiting. She has been having right sided abdominal pain for several months and recently saw her gynecologist. A pelvic US on 6/24 did not show any acute abnormalities. She presented to the ED two days ago with worsening RUQ pain and nausea. LFTs and lipase were normal. A CT scan showed a large stone within the gallbladder but no signs of acute cholecystitis, and no other acute findings. She was discharged with outpatient general surgery follow up. However her pain persisted and last night she returned to the ED. WBC was very mildly elevated to 11. A RUQ [place] again showed a large gallstone but no signs of acute cholecystitis. She was treated symptomatically but her pain persisted. General surgery was consulted for admission. She continues to have RUQ pain and nausea.   She has not had any prior abdominal surgeries. She has chronic pain for which she is on Norco, and reports she has a pain management contract with her chronic pain specialist.  ROS: Review of Systems  Constitutional:  Negative for chills and fever.  Respiratory:  Negative for shortness of breath.   Cardiovascular:  Negative for chest pain.  Gastrointestinal:  Positive for abdominal pain, nausea and vomiting.    Family History  Problem Relation Age of Onset   Cancer Father        brain tumor    Eclampsia Maternal Grandmother    Heart attack Maternal Grandmother    Cancer Maternal Grandmother        lung   Heart disease Maternal Grandmother    Hyperlipidemia Maternal Grandmother    Hypertension Maternal Grandmother     Past Medical History:  Diagnosis Date   Abnormal uterine bleeding 02/04/2012   Occurred 01/12/12-01/15/12. Evaluated in [facility] and all labs, exam, and pelvic u/s WNL. Given rx for ibuprofen and told to follow up as otupatient.     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 Overview: Pain contract; hydrocodone-acetaminophen 5-325 q6hr PRN Formatting of this note might be different from the original. Formatting of this note might be different from the original.  Overview:  Pain contract; hydrocodone-acetaminophen 5-325 q6hr PRN   Pelvic mass 07/13/2018   6.1 x 2.3 cm posterior pelvic cystic mass noted on CT 05/2018.   PUD (peptic ulcer disease) 07/13/2018   Sacroiliac joint pain 11/14/2011   Sacroiliitis, not elsewhere classified (HCC) 11/14/2011   Tobacco abuse    Underweight 10/26/2014    Past Surgical History:  Procedure Laterality Date   DENTAL REHABILITATION     full set dentures   LUMBAR LAMINECTOMY/DECOMPRESSION MICRODISCECTOMY Right 07/23/2014   Procedure:  Right Lumbar five-Sacral one laminotomy and microdiskectomy;  Surgeon: Shirlean Kelly, MD;  Location: [facility];  Service: Neurosurgery;  Laterality: Right;  Right Lumbar five-Sacral one laminotomy and microdiskectomy   Multiple epidural steroid injections in back     Over past 2 years   POSTERIOR LAMINECTOMY / DECOMPRESSION LUMBAR SPINE  06/23/2018   L5-S1 decompression    TUBAL LIGATION Bilateral 05/11/2013   Procedure: POST PARTUM TUBAL LIGATION;  Surgeon: Tereso Newcomer, MD;  Location: [facility];  Service: Gynecology;  Laterality: Bilateral;    Social History:  reports that she has been smoking e-cigarettes and cigarettes. She has never used smokeless tobacco. She reports current alcohol use. She reports that she does not use drugs.  Allergies:  Allergies  Allergen Reactions   Mobic [Meloxicam] Other (See Comments)    Stomach bleeding    Tape Rash    Nicotine patches   Wound Dressing Adhesive Rash    Nicotine patches   Penicillins Other (See Comments)    uncle highly allergic so was told whole family should not take it Did it involve swelling of the face/tongue/throat, SOB, or low BP? Unknown Did it involve sudden or severe rash/hives, skin peeling, or any reaction on the inside of your mouth or nose? Unknown Did you need to seek medical attention at a hospital or doctor's office? Unknown When did it last happen?      Never taken before If all above answers are "NO", may proceed with cephalosporin use.  Unknown, patient has never taken the medication but states her uncle has an allergy to it.    Nicotine Rash    Transdermal Patch    Medications Prior to Admission  Medication Sig Dispense Refill   atenolol (TENORMIN) 25 MG tablet Take 1 tablet (25 mg total) by mouth daily. 30 tablet 6   cyclobenzaprine (FEXMID) 7.5 MG tablet Take 1 tablet (7.5 mg total) by mouth 2 (two) times daily between meals as needed for muscle spasms. 60 tablet 3   diclofenac (VOLTAREN) 75 MG EC tablet Take 1 tablet (75 mg total) by mouth 2 (two) times daily. 30 tablet 0   DULoxetine (CYMBALTA) 60 MG capsule TAKE 1 CAPSULE(60 MG) BY MOUTH DAILY 90 capsule 1   gabapentin (NEURONTIN) 300 MG capsule Take 300 mg by mouth 2 (two) times daily.     HYDROcodone-acetaminophen (NORCO) 7.5-325 MG tablet Take 1 tablet by mouth every 8 (eight) hours as needed for moderate pain.     lidocaine (LIDODERM) 5 % Place 1 patch onto the skin as needed. Remove & Discard patch within 12 hours  or as directed by MD 15 patch 0   losartan (COZAAR) 50 MG tablet Take 1 tablet (50 mg total) by mouth daily. 90 tablet 3   traZODone (DESYREL) 100 MG tablet TAKE 1 TABLET(100 MG) BY MOUTH AT BEDTIME (Patient taking differently: Take 100 mg by mouth at bedtime.) 30 tablet 3   famotidine (PEPCID) 20 MG tablet Take 1 tablet (20 mg total) by mouth daily as needed for heartburn or indigestion. (Patient not taking: Reported on 08/27/2022) 60 tablet 3   fluticasone (FLONASE) 50 MCG/ACT nasal spray Place 1 spray into both nostrils daily. (Patient not taking: Reported on 08/27/2022) 16 g 2   HYDROcodone-acetaminophen (NORCO) 5-325 MG tablet Take 1 tablet by mouth every 8 (eight) hours as needed for moderate pain or severe pain. (Patient not taking: Reported on 08/27/2022) 90 tablet 0   hydrOXYzine (ATARAX) 25 MG tablet Take 1  tablet (25 mg total) by mouth every 8 (eight) hours as needed for itching. (Patient not taking: Reported on 08/27/2022) 120 tablet 0   norethindrone (MICRONOR) 0.35 MG tablet Take 1 tablet (0.35 mg total) by mouth daily. (Patient not taking: Reported on 08/27/2022) 28 tablet 11   triamcinolone cream (KENALOG) 0.5 % Apply 1 application topically 2 (two) times daily. (Patient not taking: Reported on 08/27/2022) 15 g 0     Physical Exam: Blood pressure (!) 91/53, pulse 72, temperature 98.2 F (36.8 C), temperature source Oral, resp. rate 17, height 5\' 3"  (1.6 m), weight 58.6 kg, last menstrual period 01/17/2021, SpO2 98 %. General: resting comfortably, appears stated age, no apparent distress Neurological: alert and oriented, no focal deficits, cranial nerves grossly in tact HEENT: normocephalic, atraumatic Respiratory: normal work of breathing on room air, symmetric chest wall expansion Abdomen: soft, nondistended, focally tender to palpation in the RUQ Extremities: warm and well-perfused, no deformities, moving all extremities spontaneously Psychiatric: normal mood and affect Skin: warm  and dry, no jaundice, no rashes or lesions   Results for orders placed or performed during the hospital encounter of 08/27/22 (from the past 48 hour(s))  Comprehensive metabolic panel     Status: Abnormal   Collection Time: 08/27/22  9:30 PM  Result Value Ref Range   Sodium 136 135 - 145 mmol/L   Potassium 3.7 3.5 - 5.1 mmol/L   Chloride 105 98 - 111 mmol/L   CO2 25 22 - 32 mmol/L   Glucose, Bld 109 (H) 70 - 99 mg/dL    Comment: Glucose reference range applies only to samples taken after fasting for at least 8 hours.   BUN 8 6 - 20 mg/dL   Creatinine, Ser 0.86 0.44 - 1.00 mg/dL   Calcium 8.8 (L) 8.9 - 10.3 mg/dL   Total Protein 6.9 6.5 - 8.1 g/dL   Albumin 3.6 3.5 - 5.0 g/dL   AST 12 (L) 15 - 41 U/L   ALT 13 0 - 44 U/L   Alkaline Phosphatase 62 38 - 126 U/L   Total Bilirubin 0.5 0.3 - 1.2 mg/dL   GFR, Estimated >57 >84 mL/min    Comment: (NOTE) Calculated using the CKD-EPI Creatinine Equation (2021)    Anion gap 6 5 - 15    Comment: Performed at [facility], [address] [place], [place] [address]  CBC with Differential     Status: Abnormal   Collection Time: 08/27/22  9:30 PM  Result Value Ref Range   WBC 11.0 (H) 4.0 - 10.5 K/uL   RBC 3.82 (L) 3.87 - 5.11 MIL/uL   Hemoglobin 12.5 12.0 - 15.0 g/dL   HCT 52.8 41.3 - 24.4 %   MCV 98.2 80.0 - 100.0 fL   MCH 32.7 26.0 - 34.0 pg   MCHC 33.3 30.0 - 36.0 g/dL   RDW 01.0 27.2 - 53.6 %   Platelets 380 150 - 400 K/uL   nRBC 0.0 0.0 - 0.2 %   Neutrophils Relative % 77 %   Neutro Abs 8.5 (H) 1.7 - 7.7 K/uL   Lymphocytes Relative 16 %   Lymphs Abs 1.8 0.7 - 4.0 K/uL   Monocytes Relative 4 %   Monocytes Absolute 0.4 0.1 - 1.0 K/uL   Eosinophils Relative 2 %   Eosinophils Absolute 0.2 0.0 - 0.5 K/uL   Basophils Relative 1 %   Basophils Absolute 0.1 0.0 - 0.1 K/uL   Immature Granulocytes 0 %   Abs Immature  Granulocytes 0.04 0.00 - 0.07 K/uL    Comment: Performed at [facility], [address] [place], [place] [address]   US Abdomen Limited RUQ (LIVER/GB)  Result Date: 08/27/2022 CLINICAL DATA:  Right upper quadrant pain EXAM: ULTRASOUND ABDOMEN LIMITED RIGHT UPPER QUADRANT COMPARISON:  08/27/2022 FINDINGS: Gallbladder: Large shadowing gallstone is identified measuring up to 2.3 cm. No gallbladder wall thickening or pericholecystic fluid. Negative sonographic Murphy sign. Common bile duct: Diameter: 4 mm Liver: No focal lesion identified. Within normal limits in parenchymal echogenicity. Portal vein is patent on color Doppler imaging with normal direction of blood flow towards the liver. Other: None. IMPRESSION: 1. Cholelithiasis without evidence of acute cholecystitis. Electronically Signed   By: Sharlet Salina M.D.   On: 08/27/2022 21:39   CT ABDOMEN PELVIS W CONTRAST  Result Date: 08/27/2022 CLINICAL DATA:  42 year old female with history of acute onset of nonlocalized abdominal pain. EXAM: CT ABDOMEN AND PELVIS WITH CONTRAST TECHNIQUE: Multidetector CT imaging of the abdomen and pelvis was performed using the standard protocol following bolus administration of intravenous contrast. RADIATION DOSE REDUCTION: This exam was performed according to the departmental dose-optimization program which includes automated exposure control, adjustment of the mA and/or kV according to patient size and/or use of iterative reconstruction technique. CONTRAST:  OMNIPAQUE IOHEXOL 300 MG/ML  SOLN COMPARISON:  CT of the abdomen and pelvis 06/30/2022. FINDINGS: Lower chest: Small hiatal hernia. Hepatobiliary: Tiny subcentimeter low-attenuation lesion in the inferior aspect of segment 5 of the liver (axial image 28 of series 3), too small to definitively characterize, but statistically likely a tiny cyst or biliary hamartoma. No other suspicious appearing hepatic lesions. No intra or extrahepatic biliary ductal dilatation. Common bile duct measures 4 mm in the porta hepatis. There is a noncalcified  gallstone in the gallbladder measuring up to 2.7 cm in diameter. Gallbladder is moderately distended. Gallbladder wall thickness appears normal. No pericholecystic fluid or surrounding inflammatory changes. Pancreas: No pancreatic mass. No pancreatic ductal dilatation. No pancreatic or peripancreatic fluid collections or inflammatory changes. Spleen: Unremarkable. Adrenals/Urinary Tract: Bilateral kidneys and bilateral adrenal glands are normal in appearance. No hydroureteronephrosis. Urinary bladder is normal in appearance. Stomach/Bowel: The appearance of the stomach is unremarkable. No pathologic dilatation of small bowel or colon. Normal appendix. Vascular/Lymphatic: No significant atherosclerotic disease, aneurysm or dissection noted in the abdominal or pelvic vasculature. No lymphadenopathy noted in the abdomen or pelvis. Reproductive: Right-sided tubal ligation clip. In additional ligation clip is noted lying dependently in the low right pelvis in the cul-de-sac, likely dislodged. Uterus and ovaries are otherwise unremarkable in appearance. Other: No significant volume of ascites.  No pneumoperitoneum. Musculoskeletal: Status post PLIF at L5-S1 with interbody cages at L5-S1 interspace. There are no aggressive appearing lytic or blastic lesions noted in the visualized portions of the skeleton. IMPRESSION: 1. No acute findings are noted in the abdomen or pelvis to account for the patient's symptoms. 2. Cholelithiasis without evidence of acute cholecystitis at this time. 3. Status post tubal ligation. The left-sided clip appears likely dislodged, located in the right posterior pelvis. Further outpatient clinical evaluation may be warranted. 4. Small hiatal hernia. Electronically Signed   By: Trudie Reed M.D.   On: 08/27/2022 06:29      Assessment/Plan 42 yo female with persistent RUQ pain and cholelithiasis. Symptoms are consistent with biliary colic. She does not have clinical signs of acute  cholecystitis, however given her persistent pain with multiple recent ED visits, I recommended proceeding with laparoscopic cholecystectomy.  The details of this procedure were reviewed, including the benefits and risks of bleeding, infection, and <0.5% risk of common bile duct injury. The patient expressed understanding and agrees to proceed with surgery. - NPO, IV fluid hydration - Pain and nausea control - OR later today for cholecystectomy by Dr. Bedelia Person - VTE: lovenox, SCDs - Dispo: admit to observation. Possible discharge home postoperatively.   Sophronia Simas, MD [facility] General, Hepatobiliary and Pancreatic Surgery 08/28/22 6:30 AM

## 2022-08-28 NOTE — TOC CM/SW Note (Signed)
Transition of Care [facility]) - Inpatient Brief Assessment   Patient Details  Name: Melissa Walker MRN: [id number] Date of Birth: 1980-06-11  Transition of Care [facility]) CM/SW Contact:    Otelia Santee, LCSW Phone Number: 08/28/2022, 2:54 PM   Clinical Narrative: Chart reviewed. No TOC needs.    Transition of Care Asessment: Insurance and Status: Insurance coverage has been reviewed Patient has primary care physician: Yes Home environment has been reviewed: Home w/ s/o Prior level of function:: Independent Prior/Current Home Services: No current home services Social Determinants of Health Reivew: SDOH reviewed no interventions necessary Readmission risk has been reviewed: Yes Transition of care needs: no transition of care needs at this time

## 2022-08-28 NOTE — Transfer of Care (Signed)
Immediate Anesthesia Transfer of Care Note  Patient: Melissa Walker  Procedure(s) Performed: LAPAROSCOPIC CHOLECYSTECTOMY (Abdomen)  Patient Location: PACU  Anesthesia Type:General  Level of Consciousness: awake, alert , and patient cooperative  Airway & Oxygen Therapy: Patient Spontanous Breathing and Patient connected to face mask oxygen  Post-op Assessment: Report given to RN, Post -op Vital signs reviewed and stable, and Patient moving all extremities X 4  Post vital signs: Reviewed and stable  Last Vitals:  Vitals Value Taken Time  BP 146/99 08/28/22 1438  Temp 36.8 C 08/28/22 1438  Pulse 91 08/28/22 1440  Resp 19 08/28/22 1440  SpO2 100 % 08/28/22 1440  Vitals shown include unvalidated device data.  Last Pain:  Vitals:   08/28/22 0940  TempSrc:   PainSc: 0-No pain      Patients Stated Pain Goal: 0 (08/28/22 0001)  Complications: No notable events documented.

## 2022-08-28 NOTE — Discharge Instructions (Addendum)
[facility]  LAPAROSCOPIC SURGERY: POST OP INSTRUCTIONS Always review your discharge instruction sheet given to you by the facility where your surgery was performed. IF YOU HAVE DISABILITY OR FAMILY LEAVE FORMS, YOU MUST BRING THEM TO THE OFFICE FOR PROCESSING.   DO NOT GIVE THEM TO YOUR DOCTOR.  PAIN CONTROL  Pain regimen: take over-the-counter tylenol (acetaminophen) 1000mg  every six hours, the prescription ibuprofen (600mg ) every six hours and the robaxin (methocarbamol) 750mg  every six hours. With all three of these, you should be taking something every two hours. Example: tylenol ( acetaminophen) at 8am, ibuprofen at 10am, robaxin (methocarbamol) at 12pm, tylenol (acetaminophen) again at 2pm, ibuprofen again at 4pm, robaxin (methocarbamol) at 6pm. You also have a prescription for oxycodone, which should be taken if the tylenol (acetaminophen), ibuprofen, and robaxin (methocarbamol) are not enough to control your pain. You may take the oxycodone as frequently as every four hours as needed, but if you are taking the other medications as above, you should not need the oxycodone this frequently. You have also been given a prescription for colace (docusate) which is a stool softener. Please take this as prescribed because the oxycodone can cause constipation and the colace (docusate) will minimize or prevent constipation. Do not drive while taking or under the influence of the oxycodone as it is a narcotic medication. Use ice packs to help control pain. If you need a refill on your pain medication, please contact your pharmacy.  They will contact our office to request authorization. Prescriptions will not be filled after 5pm or on week-ends.  HOME MEDICATIONS Take your usually prescribed medications unless otherwise directed.  DIET You should follow a light diet the first few days after arrival home.  Be sure to include lots of fluids daily.   CONSTIPATION It is common to  experience some constipation after surgery and if you are taking pain medication.  Increasing fluid intake and taking a stool softener (such as Colace) will usually help or prevent this problem from occurring.  A mild laxative (Milk of Magnesia or Miralax) should be taken according to package instructions if there are no bowel movements after 48 hours.  WOUND/INCISION CARE Most patients will experience some swelling and bruising in the area of the incisions.  Ice packs will help.  Swelling and bruising can take several days to resolve.  May shower beginning 08/29/2022.  Do not peel off or scrub skin glue. May allow warm soapy water to run over incision, then rinse and pat dry.  Do not soak in any water (tubs, hot tubs, pools, lakes, oceans) for one week.   ACTIVITIES You may resume regular (light) daily activities beginning the next day--such as daily self-care, walking, climbing stairs--gradually increasing activities as tolerated.  You may have sexual intercourse when it is comfortable.   No lifting greater than 5 pounds for six weeks.  You may drive when you are no longer taking narcotic pain medication, you can comfortably wear a seatbelt, and you can safely maneuver your car and apply brakes.  FOLLOW-UP You should see your doctor in the office for a follow-up appointment approximately 2-3 weeks after your surgery.  You should have been given your post-op/follow-up appointment when your surgery was scheduled.  If you did not receive a post-op/follow-up appointment, make sure that you call for this appointment within a day or two after you arrive home to insure a convenient appointment time.  WHEN TO CALL YOUR DOCTOR: Fever over 101.5 Inability to urinate  Continued bleeding from incision. Increased pain, redness, or drainage from the incision. Increasing abdominal pain  The clinic staff is available to answer your questions during regular business hours.  Please don't hesitate to call and  ask to speak to one of the nurses for clinical concerns.  If you have a medical emergency, go to the nearest emergency room or call 911.  A surgeon from [facility] is always on call at the hospital. [address] [place], [place], [place]  [address] ? [address], [place], [place]   [address] [phone] ? [phone] ? FAX [phone] Web site: [email or web address]

## 2022-08-28 NOTE — Plan of Care (Signed)
Patient awake, alert orientx4. Remained on RA. Returned from the PACU with 5 port sites, clean, dry and intact s/p laparoscopic cholecystectomy. Denies pain. Spouse at bedside. Patient voided. Denies nausea and vomiting. Discharge to home as per MD order. Patient left unit via wheelchair. No signs or symptoms of acute distress.  Problem: Education: Goal: Knowledge of General Education information will improve Description: Including pain rating scale, medication(s)/side effects and non-pharmacologic comfort measures Outcome: Adequate for Discharge   Problem: Health Behavior/Discharge Planning: Goal: Ability to manage health-related needs will improve Outcome: Adequate for Discharge   Problem: Clinical Measurements: Goal: Ability to maintain clinical measurements within normal limits will improve Outcome: Adequate for Discharge Goal: Will remain free from infection Outcome: Adequate for Discharge Goal: Diagnostic test results will improve Outcome: Adequate for Discharge Goal: Respiratory complications will improve Outcome: Adequate for Discharge Goal: Cardiovascular complication will be avoided Outcome: Adequate for Discharge   Problem: Activity: Goal: Risk for activity intolerance will decrease Outcome: Adequate for Discharge   Problem: Nutrition: Goal: Adequate nutrition will be maintained Outcome: Adequate for Discharge   Problem: Coping: Goal: Level of anxiety will decrease Outcome: Adequate for Discharge   Problem: Elimination: Goal: Will not experience complications related to bowel motility Outcome: Adequate for Discharge Goal: Will not experience complications related to urinary retention Outcome: Adequate for Discharge   Problem: Pain Managment: Goal: General experience of comfort will improve Outcome: Adequate for Discharge   Problem: Safety: Goal: Ability to remain free from injury will improve Outcome: Adequate for Discharge   Problem: Skin Integrity: Goal:  Risk for impaired skin integrity will decrease Outcome: Adequate for Discharge

## 2022-08-28 NOTE — Op Note (Signed)
   Operative Note  Date: 08/28/2022  Procedure: laparoscopic cholecystectomy  Pre-op diagnosis: symptomatic cholelithiasis Post-op diagnosis: same  Indication and clinical history: The patient is a 42 y.o. year old female with symptomatic cholelithiasis.   Surgeon: Diamantina Monks, MD  Anesthesiologist: Nance Pew, MD Anesthesia: General  Findings:  Specimen: gallbladder EBL: <5cc Drains/Implants: none  Disposition: PACU - hemodynamically stable.  Description of procedure: The patient was positioned supine on the operating room table. Time-out was performed verifying correct patient, procedure, signature of informed consent, and administration of pre-operative antibiotics. General anesthetic induction and intubation were uneventful. The abdomen was prepped and draped in the usual sterile fashion. An supra-umbilical incision was made using an open technique using zero vicryl stay sutures on either side of the fascia and a 10mm Hassan port inserted. After establishing pneumoperitoneum, which the patient tolerated well, the abdominal cavity was inspected and no injury of any intra-abdominal structures was identified. Additional ports were placed under direct visualization and using local anesthetic: two 5mm ports in the right subcostal region and a 5mm port in the epigastric region. The patient was re-positioned to reverse Trendelenburg and right side up. The infundibulum was identified and retracted toward the right lower quadrant. The peritoneum was incised over the infundibulum and the triangle of Calot dissected to expose the critical view of safety. The cystic duct was identified as a single structure entering the gallbladder, and was doubly clipped and divided. Anterior and posterior cystic artery branches were identified and doubly clipped and divided. The gallbladder was dissected off the liver bed using electrocautery and hemostasis of the liver bed was confirmed prior to separation of  the final peritoneal attachments of the gallbladder to the liver bed. The gallbladder fossa was irrigated and fluid returned clear. After transection of the final peritoneal attachments, the gallbladder was placed in an endoscopic specimen retrieval bag, removed via the umbilical port site, and sent to pathology as a permanent specimen. The gallbladder fossa was inspected confirming hemostasis, the absence of bile leakage from the cystic duct stump, and correct placement of clips on the cystic artery and cystic duct stumps. The abdomen was desufflated and the fascia of the umbilical port site was closed using the previously placed stay sutures. Additional local anesthetic was administered at the umbilical port site.  The skin of all incisions was closed with 4-0 monocryl. Sterile dressings were applied. All sponge and instrument counts were correct at the conclusion of the procedure. The patient was awakened from anesthesia, extubated uneventfully, and transported to the PACU - hemodynamically stable.. There were no complications.    Diamantina Monks, MD General and Trauma Surgery [facility] Surgery

## 2022-08-28 NOTE — Anesthesia Preprocedure Evaluation (Addendum)
Anesthesia Evaluation  Patient identified by MRN, date of birth, ID band Patient awake    Reviewed: Allergy & Precautions, NPO status , Patient's Chart, lab work & pertinent test results  Airway Mallampati: II  TM Distance: >3 FB Neck ROM: Full    Dental no notable dental hx. (+) Edentulous Upper, Edentulous Lower   Pulmonary Current Smoker and Patient abstained from smoking.   Pulmonary exam normal        Cardiovascular hypertension, Pt. on medications and Pt. on home beta blockers  Rhythm:Regular Rate:Normal     Neuro/Psych   Anxiety     negative neurological ROS     GI/Hepatic Neg liver ROS, PUD,GERD  ,,Cholecystitis    Endo/Other  negative endocrine ROS    Renal/GU negative Renal ROS  negative genitourinary   Musculoskeletal  (+) Arthritis , Osteoarthritis,    Abdominal Normal abdominal exam  (+)   Peds  Hematology  (+) Blood dyscrasia, anemia Lab Results      Component                Value               Date                      WBC                      11.0 (H)            08/27/2022                HGB                      12.5                08/27/2022                HCT                      37.5                08/27/2022                MCV                      98.2                08/27/2022                PLT                      380                 08/27/2022              Anesthesia Other Findings   Reproductive/Obstetrics                             Anesthesia Physical Anesthesia Plan  ASA: 3  Anesthesia Plan: General   Post-op Pain Management: Tylenol PO (pre-op)*   Induction: Intravenous  PONV Risk Score and Plan: 2 and Ondansetron, Dexamethasone, Midazolam and Treatment may vary due to age or medical condition  Airway Management Planned: Mask and Oral ETT  Additional Equipment: None  Intra-op Plan:   Post-operative Plan: Extubation in OR  Informed Consent: I  have reviewed the patients History  and Physical, chart, labs and discussed the procedure including the risks, benefits and alternatives for the proposed anesthesia with the patient or authorized representative who has indicated his/her understanding and acceptance.     Dental advisory given  Plan Discussed with: CRNA  Anesthesia Plan Comments:        Anesthesia Quick Evaluation

## 2022-08-28 NOTE — Anesthesia Postprocedure Evaluation (Signed)
Anesthesia Post Note  Patient: Melissa Walker  Procedure(s) Performed: LAPAROSCOPIC CHOLECYSTECTOMY (Abdomen)     Patient location during evaluation: PACU Anesthesia Type: General Level of consciousness: awake and alert Pain management: pain level controlled Vital Signs Assessment: post-procedure vital signs reviewed and stable Respiratory status: spontaneous breathing, nonlabored ventilation, respiratory function stable and patient connected to nasal cannula oxygen Cardiovascular status: blood pressure returned to baseline and stable Postop Assessment: no apparent nausea or vomiting Anesthetic complications: no   No notable events documented.  Last Vitals:  Vitals:   08/28/22 1515 08/28/22 1540  BP: 136/81 132/77  Pulse: 85 83  Resp: 14 16  Temp: 36.7 C 37.3 C  SpO2: 97% 98%    Last Pain:  Vitals:   08/28/22 1540  TempSrc: Axillary  PainSc: 0-No pain                 Earl Lites P Domitila Stetler

## 2022-08-29 ENCOUNTER — Encounter (HOSPITAL_COMMUNITY): Payer: Self-pay | Admitting: Surgery

## 2022-08-31 ENCOUNTER — Telehealth: Payer: Self-pay | Admitting: Obstetrics and Gynecology

## 2022-08-31 NOTE — Telephone Encounter (Signed)
Attempted to reach patient about scheduling her follow-up appointment. Left a detailed message for her to call the office or send a MyChart message to get scheduled.

## 2022-09-01 ENCOUNTER — Inpatient Hospital Stay (HOSPITAL_COMMUNITY)
Admission: EM | Admit: 2022-09-01 | Discharge: 2022-09-04 | DRG: 378 | Disposition: A | Discharge status: Home / Self Care | Payer: Medicaid Other | Attending: Internal Medicine | Admitting: Internal Medicine

## 2022-09-01 ENCOUNTER — Emergency Department (HOSPITAL_COMMUNITY): Payer: Medicaid Other

## 2022-09-01 ENCOUNTER — Other Ambulatory Visit: Payer: Self-pay

## 2022-09-01 ENCOUNTER — Encounter (HOSPITAL_COMMUNITY): Payer: Self-pay

## 2022-09-01 DIAGNOSIS — F32A Depression, unspecified: Secondary | ICD-10-CM | POA: Diagnosis present

## 2022-09-01 DIAGNOSIS — Z886 Allergy status to analgesic agent status: Secondary | ICD-10-CM

## 2022-09-01 DIAGNOSIS — Z8249 Family history of ischemic heart disease and other diseases of the circulatory system: Secondary | ICD-10-CM

## 2022-09-01 DIAGNOSIS — F17200 Nicotine dependence, unspecified, uncomplicated: Secondary | ICD-10-CM | POA: Diagnosis present

## 2022-09-01 DIAGNOSIS — M545 Low back pain, unspecified: Secondary | ICD-10-CM | POA: Diagnosis present

## 2022-09-01 DIAGNOSIS — Z8711 Personal history of peptic ulcer disease: Secondary | ICD-10-CM

## 2022-09-01 DIAGNOSIS — I10 Essential (primary) hypertension: Secondary | ICD-10-CM | POA: Diagnosis present

## 2022-09-01 DIAGNOSIS — D72829 Elevated white blood cell count, unspecified: Secondary | ICD-10-CM | POA: Diagnosis present

## 2022-09-01 DIAGNOSIS — Z79899 Other long term (current) drug therapy: Secondary | ICD-10-CM

## 2022-09-01 DIAGNOSIS — Z8742 Personal history of other diseases of the female genital tract: Secondary | ICD-10-CM

## 2022-09-01 DIAGNOSIS — F17290 Nicotine dependence, other tobacco product, uncomplicated: Secondary | ICD-10-CM | POA: Diagnosis present

## 2022-09-01 DIAGNOSIS — Z888 Allergy status to other drugs, medicaments and biological substances status: Secondary | ICD-10-CM

## 2022-09-01 DIAGNOSIS — Z91048 Other nonmedicinal substance allergy status: Secondary | ICD-10-CM

## 2022-09-01 DIAGNOSIS — R7401 Elevation of levels of liver transaminase levels: Secondary | ICD-10-CM | POA: Diagnosis present

## 2022-09-01 DIAGNOSIS — D62 Acute posthemorrhagic anemia: Secondary | ICD-10-CM | POA: Diagnosis present

## 2022-09-01 DIAGNOSIS — K922 Gastrointestinal hemorrhage, unspecified: Principal | ICD-10-CM

## 2022-09-01 DIAGNOSIS — Z83438 Family history of other disorder of lipoprotein metabolism and other lipidemia: Secondary | ICD-10-CM

## 2022-09-01 DIAGNOSIS — K920 Hematemesis: Secondary | ICD-10-CM | POA: Diagnosis present

## 2022-09-01 DIAGNOSIS — K921 Melena: Secondary | ICD-10-CM

## 2022-09-01 DIAGNOSIS — F419 Anxiety disorder, unspecified: Secondary | ICD-10-CM | POA: Diagnosis present

## 2022-09-01 DIAGNOSIS — D75839 Thrombocytosis, unspecified: Secondary | ICD-10-CM | POA: Diagnosis present

## 2022-09-01 DIAGNOSIS — Z9049 Acquired absence of other specified parts of digestive tract: Secondary | ICD-10-CM

## 2022-09-01 DIAGNOSIS — Z880 Allergy status to penicillin: Secondary | ICD-10-CM

## 2022-09-01 DIAGNOSIS — Z23 Encounter for immunization: Secondary | ICD-10-CM

## 2022-09-01 DIAGNOSIS — K264 Chronic or unspecified duodenal ulcer with hemorrhage: Principal | ICD-10-CM | POA: Insufficient documentation

## 2022-09-01 DIAGNOSIS — G894 Chronic pain syndrome: Secondary | ICD-10-CM | POA: Diagnosis present

## 2022-09-01 DIAGNOSIS — M549 Dorsalgia, unspecified: Secondary | ICD-10-CM | POA: Diagnosis present

## 2022-09-01 DIAGNOSIS — G629 Polyneuropathy, unspecified: Secondary | ICD-10-CM

## 2022-09-01 DIAGNOSIS — K2100 Gastro-esophageal reflux disease with esophagitis, without bleeding: Secondary | ICD-10-CM | POA: Diagnosis present

## 2022-09-01 DIAGNOSIS — E876 Hypokalemia: Secondary | ICD-10-CM | POA: Diagnosis present

## 2022-09-01 DIAGNOSIS — R000 Tachycardia, unspecified: Secondary | ICD-10-CM

## 2022-09-01 DIAGNOSIS — F1120 Opioid dependence, uncomplicated: Secondary | ICD-10-CM | POA: Diagnosis present

## 2022-09-01 DIAGNOSIS — E538 Deficiency of other specified B group vitamins: Secondary | ICD-10-CM | POA: Diagnosis present

## 2022-09-01 LAB — CBC WITH DIFFERENTIAL/PLATELET
Abs Immature Granulocytes: 0.04 10*3/uL (ref 0.00–0.07)
Basophils Absolute: 0 10*3/uL (ref 0.0–0.1)
Basophils Relative: 0 %
Eosinophils Absolute: 0.2 10*3/uL (ref 0.0–0.5)
Eosinophils Relative: 2 %
HCT: 28.3 % — ABNORMAL LOW (ref 36.0–46.0)
Hemoglobin: 9.4 g/dL — ABNORMAL LOW (ref 12.0–15.0)
Immature Granulocytes: 0 %
Lymphocytes Relative: 20 %
Lymphs Abs: 2.3 10*3/uL (ref 0.7–4.0)
MCH: 33.5 pg (ref 26.0–34.0)
MCHC: 33.2 g/dL (ref 30.0–36.0)
MCV: 100.7 fL — ABNORMAL HIGH (ref 80.0–100.0)
Monocytes Absolute: 0.6 10*3/uL (ref 0.1–1.0)
Monocytes Relative: 5 %
Neutro Abs: 8.6 10*3/uL — ABNORMAL HIGH (ref 1.7–7.7)
Neutrophils Relative %: 73 %
Platelets: 440 10*3/uL — ABNORMAL HIGH (ref 150–400)
RBC: 2.81 MIL/uL — ABNORMAL LOW (ref 3.87–5.11)
RDW: 12.7 % (ref 11.5–15.5)
WBC: 11.7 10*3/uL — ABNORMAL HIGH (ref 4.0–10.5)
nRBC: 0 % (ref 0.0–0.2)

## 2022-09-01 LAB — I-STAT CHEM 8, ED
BUN: 20 mg/dL (ref 6–20)
Calcium, Ion: 1.16 mmol/L (ref 1.15–1.40)
Chloride: 101 mmol/L (ref 98–111)
Creatinine, Ser: 0.7 mg/dL (ref 0.44–1.00)
Glucose, Bld: 111 mg/dL — ABNORMAL HIGH (ref 70–99)
HCT: 29 % — ABNORMAL LOW (ref 36.0–46.0)
Hemoglobin: 9.9 g/dL — ABNORMAL LOW (ref 12.0–15.0)
Potassium: 3.6 mmol/L (ref 3.5–5.1)
Sodium: 138 mmol/L (ref 135–145)
TCO2: 27 mmol/L (ref 22–32)

## 2022-09-01 LAB — CBC
HCT: 23 % — ABNORMAL LOW (ref 36.0–46.0)
Hemoglobin: 7.6 g/dL — ABNORMAL LOW (ref 12.0–15.0)
MCH: 33 pg (ref 26.0–34.0)
MCHC: 33 g/dL (ref 30.0–36.0)
MCV: 100 fL (ref 80.0–100.0)
Platelets: 424 10*3/uL — ABNORMAL HIGH (ref 150–400)
RBC: 2.3 MIL/uL — ABNORMAL LOW (ref 3.87–5.11)
RDW: 12.9 % (ref 11.5–15.5)
WBC: 11.1 10*3/uL — ABNORMAL HIGH (ref 4.0–10.5)
nRBC: 0 % (ref 0.0–0.2)

## 2022-09-01 LAB — COMPREHENSIVE METABOLIC PANEL
ALT: 68 U/L — ABNORMAL HIGH (ref 0–44)
AST: 36 U/L (ref 15–41)
Albumin: 2.9 g/dL — ABNORMAL LOW (ref 3.5–5.0)
Alkaline Phosphatase: 129 U/L — ABNORMAL HIGH (ref 38–126)
Anion gap: 7 (ref 5–15)
BUN: 21 mg/dL — ABNORMAL HIGH (ref 6–20)
CO2: 27 mmol/L (ref 22–32)
Calcium: 8.2 mg/dL — ABNORMAL LOW (ref 8.9–10.3)
Chloride: 103 mmol/L (ref 98–111)
Creatinine, Ser: 0.76 mg/dL (ref 0.44–1.00)
GFR, Estimated: >60 mL/min (ref >60)
Glucose, Bld: 116 mg/dL — ABNORMAL HIGH (ref 70–99)
Potassium: 3.4 mmol/L — ABNORMAL LOW (ref 3.5–5.1)
Sodium: 137 mmol/L (ref 135–145)
Total Bilirubin: 0.5 mg/dL (ref 0.3–1.2)
Total Protein: 6 g/dL — ABNORMAL LOW (ref 6.5–8.1)

## 2022-09-01 LAB — POC OCCULT BLOOD, ED: Fecal Occult Bld: POSITIVE — AB

## 2022-09-01 LAB — SURGICAL PATHOLOGY

## 2022-09-01 LAB — MAGNESIUM: Magnesium: 1.4 mg/dL — ABNORMAL LOW (ref 1.7–2.4)

## 2022-09-01 MED ORDER — ONDANSETRON HCL 4 MG/2ML IJ SOLN: 4.0000 mg | Freq: Four times a day (QID) | INTRAMUSCULAR | Status: DC | PRN

## 2022-09-01 MED ORDER — SODIUM CHLORIDE 0.9 % IV SOLN: INTRAVENOUS | Status: DC

## 2022-09-01 MED ORDER — MORPHINE SULFATE (PF) 2 MG/ML IV SOLN
2.0000 mg | INTRAVENOUS | Status: DC | PRN
  Administered 2022-09-02 – 2022-09-03 (×3): 2 mg via INTRAVENOUS
  Filled 2022-09-01 (×3): qty 1

## 2022-09-01 MED ORDER — PANTOPRAZOLE SODIUM 40 MG IV SOLR
40.0000 mg | Freq: Once | INTRAVENOUS | Status: AC
  Administered 2022-09-01: 40 mg via INTRAVENOUS
  Filled 2022-09-01: qty 10

## 2022-09-01 MED ORDER — IOHEXOL 300 MG/ML  SOLN
100.0000 mL | Freq: Once | INTRAMUSCULAR | Status: AC | PRN
  Administered 2022-09-01: 100 mL via INTRAVENOUS

## 2022-09-01 MED ORDER — METOPROLOL TARTRATE 5 MG/5ML IV SOLN: 2.5000 mg | Freq: Three times a day (TID) | INTRAVENOUS | Status: DC | PRN

## 2022-09-01 MED ORDER — METOPROLOL TARTRATE 5 MG/5ML IV SOLN
2.5000 mg | Freq: Once | INTRAVENOUS | Status: AC
  Administered 2022-09-01: 2.5 mg via INTRAVENOUS
  Filled 2022-09-01: qty 5

## 2022-09-01 MED ORDER — PANTOPRAZOLE INFUSION (NEW) - SIMPLE MED
8.0000 mg/h | INTRAVENOUS | Status: DC
  Administered 2022-09-01 – 2022-09-04 (×6): 8 mg/h via INTRAVENOUS
  Filled 2022-09-01: qty 80
  Filled 2022-09-01 (×4): qty 100
  Filled 2022-09-01 (×4): qty 80

## 2022-09-01 MED ORDER — ONDANSETRON HCL 4 MG/2ML IJ SOLN
4.0000 mg | Freq: Once | INTRAMUSCULAR | Status: AC
  Administered 2022-09-01: 4 mg via INTRAVENOUS
  Filled 2022-09-01: qty 2

## 2022-09-01 MED ORDER — LACTATED RINGERS IV BOLUS
1000.0000 mL | Freq: Once | INTRAVENOUS | Status: AC
  Administered 2022-09-01: 1000 mL via INTRAVENOUS

## 2022-09-01 MED ORDER — FENTANYL CITRATE PF 50 MCG/ML IJ SOSY
50.0000 ug | PREFILLED_SYRINGE | Freq: Once | INTRAMUSCULAR | Status: AC
  Administered 2022-09-01: 50 ug via INTRAVENOUS
  Filled 2022-09-01: qty 1

## 2022-09-01 MED ORDER — METOPROLOL TARTRATE 5 MG/5ML IV SOLN: 5.0000 mg | Freq: Once | INTRAVENOUS | Status: DC

## 2022-09-01 MED ORDER — ONDANSETRON HCL 4 MG PO TABS: 4.0000 mg | ORAL_TABLET | Freq: Four times a day (QID) | ORAL | Status: DC | PRN

## 2022-09-01 NOTE — Assessment & Plan Note (Signed)
POD #4 She has no pain, fever, or drainage from incision sites Normal BM

## 2022-09-01 NOTE — Assessment & Plan Note (Signed)
Continue cymbalta  

## 2022-09-01 NOTE — Assessment & Plan Note (Signed)
IV metoprolol PRN with NPO status

## 2022-09-01 NOTE — H&P (Signed)
History and Physical    Patient: Melissa Walker ZOX:[id number] DOB: 1980-06-24 DOA: 09/01/2022 DOS: the patient was seen and examined on 09/01/2022 PCP: Eloisa Northern, MD  Patient coming from: Home - lives with boyfriend and her 2 kids. Ambulates with walker and cane at time.    Chief Complaint: hematemesis   HPI: Melissa Walker is a 42 y.o. female with medical history significant of HTN, post menopausal bleeding, anxiety, chronic back pain, GERD, peripheral neuropathy, b12 deficiency who had recent hospitalization on 6/27 for symptomatic cholecystitis s/p lap cholecystectomy on 6/28 who presented to ED with complaints of acute onset of bloody emesis today. She hasn't had much of an appetite, but has been drinking well.  She states today she didn't feel like herself and had a sudden onset of a hot flash and threw up and it was nothing but blood. She called the surgeon who told her to call 911 and come to ED. She does use NSAIDs on occasion, but not daily. She does drink a lot of caffeine. She denies any abdominal pain. No diarrhea. She denies any dark/tarry stools.   She does have hx of PUD. EGD in 08/2018. LA grade A esophagitis. Normal stomach and duodenum.   POD #4: no abdominal pain, no diarrhea. Normal BM.   She vapes and she does not drink alcohol.   Denies any fever/chills, vision changes/headaches, chest pain or palpitations, shortness of breath or cough, abdominal pain, diarrhea, dysuria or leg swelling.    ER Course:  vitals: afebrile, bp: 143/99, HR: 130, RR: 17, oxygen: 97%RA Pertinent labs: wbc: 11.7, hgb: 9.4, potassium: 3.4, ALT: 68, alk phos: 129, fecal occult: + CT abdomen/pelvis: Status post cholecystectomy. Mild fluid is noted within the gallbladder fossa consistent with the recent surgery although no biloma or findings of bile leak are seen. Stable 2.4 cm cyst in the left ovary.  No follow-up is recommended. Stable dislodged left tubal ligation clip. In ED: given 1L IVF  bolus, protonix and started on protonix gtt. Sent [facility] a message. [facility] asked to admit.    Review of Systems: As mentioned in the history of present illness. All other systems reviewed and are negative. Past Medical History:  Diagnosis Date   Abnormal uterine bleeding 02/04/2012   Occurred 01/12/12-01/15/12. Evaluated in [facility] and all labs, exam, and pelvic u/s WNL. Given rx for ibuprofen and told to follow up as otupatient.     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 Overview: Pain contract; hydrocodone-acetaminophen 5-325 q6hr PRN Formatting of this note might be different from the original. Formatting of this note might be different from the original.  Overview:  Pain contract; hydrocodone-acetaminophen 5-325 q6hr PRN   Pelvic mass 07/13/2018   6.1 x 2.3 cm posterior pelvic cystic  mass noted on CT 05/2018.   PUD (peptic ulcer disease) 07/13/2018   Sacroiliac joint pain 11/14/2011   Sacroiliitis, not elsewhere classified (HCC) 11/14/2011   Tobacco abuse    Underweight 10/26/2014   Past Surgical History:  Procedure Laterality Date   CHOLECYSTECTOMY N/A 08/28/2022   Procedure: LAPAROSCOPIC CHOLECYSTECTOMY;  Surgeon: Diamantina Monks, MD;  Location: [facility];  Service: General;   Laterality: N/A;   DENTAL REHABILITATION     full set dentures   LUMBAR LAMINECTOMY/DECOMPRESSION MICRODISCECTOMY Right 07/23/2014   Procedure: Right Lumbar five-Sacral one laminotomy and microdiskectomy;  Surgeon: Shirlean Kelly, MD;  Location: [facility];  Service: Neurosurgery;  Laterality: Right;  Right Lumbar five-Sacral one laminotomy and microdiskectomy   Multiple epidural steroid injections in back     Over past 2 years   POSTERIOR LAMINECTOMY / DECOMPRESSION LUMBAR SPINE  06/23/2018   L5-S1 decompression    TUBAL LIGATION Bilateral 05/11/2013   Procedure: POST PARTUM TUBAL LIGATION;  Surgeon: Tereso Newcomer, MD;  Location: [facility] ORS;  Service: Gynecology;  Laterality: Bilateral;   Social History:  reports that she has been smoking e-cigarettes and cigarettes. She has never used smokeless tobacco. She reports current alcohol use. She reports that she does not use drugs.  Allergies  Allergen Reactions   Mobic [Meloxicam] Other (See Comments)    Stomach bleeding    Tape Rash    Nicotine patches   Wound Dressing Adhesive Rash    Nicotine patches   Penicillins Other (See Comments)    uncle highly allergic so was told whole family should not take it Did it involve swelling of the face/tongue/throat, SOB, or low BP? Unknown Did it involve sudden or severe rash/hives, skin peeling, or any reaction on the inside of your mouth or nose? Unknown Did you need to seek medical attention at a hospital or doctor's office? Unknown When did it last happen?      Never taken before If all above answers are "NO", may proceed with cephalosporin use.  Unknown, patient has never taken the medication but states her uncle has an allergy to it.    Nicotine Rash    Transdermal Patch    Family History  Problem Relation Age of Onset   Cancer Father        brain tumor    Eclampsia Maternal Grandmother    Heart attack Maternal Grandmother    Cancer Maternal Grandmother        lung   Heart disease  Maternal Grandmother    Hyperlipidemia Maternal Grandmother    Hypertension Maternal Grandmother     Prior to Admission medications   Medication Sig Start Date End Date Taking? Authorizing Provider  acetaminophen (TYLENOL) 500 MG tablet Take 2 tablets (1,000 mg total) by mouth 4 (four) times daily for 365 doses. 08/28/22 11/28/22  Diamantina Monks, MD  atenolol (TENORMIN) 25 MG tablet Take 1 tablet (25 mg total) by mouth daily. 03/31/22   Eloisa Northern, MD  cyclobenzaprine (FEXMID) 7.5 MG tablet Take 1 tablet (7.5 mg total) by mouth 2 (two) times daily between meals as needed for muscle spasms. 03/31/22   Eloisa Northern, MD  diclofenac (VOLTAREN) 75 MG EC tablet Take 1 tablet (75 mg total) by mouth 2 (two) times daily. 06/05/22   Eloisa Northern, MD  docusate sodium (COLACE) 100 MG capsule Take 1 capsule (100 mg total) by mouth 2 (two) times daily. 08/28/22 08/28/23  Diamantina Monks, MD  DULoxetine (CYMBALTA) 60 MG capsule TAKE  1 CAPSULE(60 MG) BY MOUTH DAILY 03/18/21   Verdene Lennert, MD  famotidine (PEPCID) 20 MG tablet Take 1 tablet (20 mg total) by mouth daily as needed for heartburn or indigestion. Patient not taking: Reported on 08/27/2022 01/08/20   Verdene Lennert, MD  fluticasone [facility]) 50 MCG/ACT nasal spray Place 1 spray into both nostrils daily. Patient not taking: Reported on 08/27/2022 03/22/18 03/31/22  Nyra Market, MD  gabapentin (NEURONTIN) 300 MG capsule Take 300 mg by mouth 2 (two) times daily.    [provider]  HYDROcodone-acetaminophen (NORCO) 5-325 MG tablet Take 1 tablet by mouth every 8 (eight) hours as needed for moderate pain or severe pain. Patient not taking: Reported on 08/27/2022 05/09/21   Verdene Lennert, MD  HYDROcodone-acetaminophen (NORCO) 7.5-325 MG tablet Take 1 tablet by mouth every 8 (eight) hours as needed for moderate pain.    [provider]  hydrOXYzine (ATARAX) 25 MG tablet Take 1 tablet (25 mg total) by mouth every 8 (eight) hours as needed for  itching. Patient not taking: Reported on 08/27/2022 05/15/21   Verdene Lennert, MD  ibuprofen (ADVIL) 600 MG tablet Take 1 tablet (600 mg total) by mouth 4 (four) times daily. 08/28/22   Diamantina Monks, MD  lidocaine (LIDODERM) 5 % Place 1 patch onto the skin as needed. Remove & Discard patch within 12 hours or as directed by MD 04/09/22   Eloisa Northern, MD  losartan (COZAAR) 50 MG tablet Take 1 tablet (50 mg total) by mouth daily. 03/31/22   Eloisa Northern, MD  methocarbamol (ROBAXIN-750) 750 MG tablet Take 1 tablet (750 mg total) by mouth 4 (four) times daily. 08/28/22   Diamantina Monks, MD  norethindrone (MICRONOR) 0.35 MG tablet Take 1 tablet (0.35 mg total) by mouth daily. Patient not taking: Reported on 08/27/2022 07/22/20   Federico Flake, MD  oxyCODONE (ROXICODONE) 5 MG immediate release tablet Take 1 tablet (5 mg total) by mouth every 4 (four) hours as needed for severe pain. 08/28/22 08/28/23  Diamantina Monks, MD  traZODone (DESYREL) 100 MG tablet TAKE 1 TABLET(100 MG) BY MOUTH AT BEDTIME Patient taking differently: Take 100 mg by mouth at bedtime. 07/15/22   Eloisa Northern, MD  triamcinolone cream (KENALOG) 0.5 % Apply 1 application topically 2 (two) times daily. Patient not taking: Reported on 08/27/2022 05/06/20   Evlyn Kanner, MD    Physical Exam: Vitals:   09/01/22 1721 09/01/22 1830 09/01/22 1852 09/01/22 2124  BP:  129/73 135/80   Pulse:   96   Resp:  (!) 26 18   Temp:    99.4 F (37.4 C)  TempSrc:    Oral  SpO2:   100%   Weight: 59 kg     Height: 5\' 3"  (1.6 m)      General:  Appears calm and comfortable and is in NAD Eyes:  PERRL, EOMI, normal lids, iris ENT:  grossly normal hearing, lips & tongue, mmm; no teeth  Neck:  no LAD, masses or thyromegaly; no carotid bruits Cardiovascular:  Regularly tachycardic, no m/r/g. No LE edema.  Respiratory:   CTA bilaterally with no wheezes/rales/rhonchi.  Normal respiratory effort. Abdomen:  soft, NT, ND, NABS. She has incisions marks  at umbilicus, RUQ and epigastric area. No drainage, surrounding ecchymosis.  Back:   normal alignment, no CVAT Skin:  no rash or induration seen on limited exam. Multiple skin lesions  Musculoskeletal:  grossly normal tone BUE/BLE, good ROM, no bony abnormality Lower extremity:  No LE  edema.  Limited foot exam with no ulcerations.  2+ distal pulses. Psychiatric:  grossly normal mood and affect, speech fluent and appropriate, AOx3 Neurologic:  CN 2-12 grossly intact, moves all extremities in coordinated fashion, sensation intact   Radiological Exams on Admission: Independently reviewed - see discussion in A/P where applicable  CT ABDOMEN PELVIS W CONTRAST  Result Date: 09/01/2022 CLINICAL DATA:  Status post cholecystectomy 4 days ago with right-sided abdominal pain, initial encounter EXAM: CT ABDOMEN AND PELVIS WITH CONTRAST TECHNIQUE: Multidetector CT imaging of the abdomen and pelvis was performed using the standard protocol following bolus administration of intravenous contrast. RADIATION DOSE REDUCTION: This exam was performed according to the departmental dose-optimization program which includes automated exposure control, adjustment of the mA and/or kV according to patient size and/or use of iterative reconstruction technique. CONTRAST:  OMNIPAQUE IOHEXOL 300 MG/ML  SOLN COMPARISON:  None Available. FINDINGS: Lower chest: No acute abnormality. Hepatobiliary: Gallbladder has been surgically removed. Minimal fluid is noted in the gallbladder fossa consistent with the recent surgery. No focal abnormality to suggest bile leak or biloma is noted. Liver is fatty infiltrated. Pancreas: Unremarkable. No pancreatic ductal dilatation or surrounding inflammatory changes. Spleen: Normal in size without focal abnormality. Adrenals/Urinary Tract: Adrenal glands are within normal limits. Kidneys are well visualized bilaterally. No renal calculi or obstructive changes are noted. Bladder is partially  distended. Stomach/Bowel: The appendix is within normal limits. No obstructive or inflammatory changes of the colon are seen. Small bowel and stomach are within normal limits. Vascular/Lymphatic: No significant vascular findings are present. No enlarged abdominal or pelvic lymph nodes. Reproductive: Uterus and bilateral adnexa are unremarkable. 2.4 cm simple cyst is noted in the left ovary. This is stable from the prior exam. Left tubal ligation clip is displaced. This is stable from the prior exam. Other: No abdominal wall hernia or abnormality. No abdominopelvic ascites. Musculoskeletal: Postsurgical changes are noted in the lower lumbar spine. No hardware abnormality is seen. IMPRESSION: Status post cholecystectomy. Mild fluid is noted within the gallbladder fossa consistent with the recent surgery although no biloma or findings of bile leak are seen. Stable 2.4 cm cyst in the left ovary.  No follow-up is recommended. Stable dislodged left tubal ligation clip. Electronically Signed   By: Alcide Clever M.D.   On: 09/01/2022 19:29    EKG: Independently reviewed.  Sinus tachycardia with rate 130; nonspecific ST changes with no evidence of acute ischemia   Labs on Admission: I have personally reviewed the available labs and imaging studies at the time of the admission.  Pertinent labs:   wbc: 11.7,  hgb: 9.4,  potassium: 3.4,  ALT: 68,  alk phos: 129,  fecal occult: +  Assessment and Plan: Principal Problem:   Upper GI bleed Active Problems:   Acute on chronic blood loss anemia   Sinus tachycardia   Status post lap cholecystectomy   Peripheral neuropathy   Hypertension   Vitamin B 12 deficiency   Anxiety   History of ovarian cyst    Assessment and Plan: * Upper GI bleed 43 year old presenting to ED with acute onset of bloody emesis today with +fecal occult and 2 episodes of coffee ground emeis in ED. She is POD #4 from laparoscopic cholecystectomy.  -obs to stepdown -GI consulted,  asked EDP to call as may need octreotide gtt with continued emesis and to be seen more urgently  -continue protonix gtt  -NPO, plan for EGD tomorrow unless needs more urgently tonight  -IVF -  trend CBC q 6, transfuse if hgb <7.0  -denies frequent NSAID use. Check UDS. Does drink a lot of caffiene and hx of recent surgery  -history of EGD in 2020 with LA grade A esophagitis. Normal stomach and duodenum.   Acute on chronic blood loss anemia Hgb 9.4 today, baseline of 11.4-12.  Likely secondary to UGIB with hematemesis and + fecal occult  Cbc q 6 hours Transfuse if hgb <7   Sinus tachycardia History of sinus tachycardia since at least 2021 On atenolol and has not taken this today Saw cardiology in the past Could have some rebound tachycardia from missed atenlol Continue IVF, give IV dose of metoprolol since NPO  Check TSH/magnesium   Status post lap cholecystectomy POD #4 She has no pain, fever, or drainage from incision sites Normal BM   Peripheral neuropathy Hx of low B12 Continue gabapentin and cymbalta when no longer NPO  Repeat B12 pending   Hypertension IV metoprolol PRN with NPO status   Vitamin B 12 deficiency Continue daily B12-has not been taking this   Anxiety Continue cymbalta   History of ovarian cyst Stable 2.4cm left ovarian cyst. No f/u recommended Follows with GYN.     Advance Care Planning:   Code Status: Full Code   Consults: [facility]: Dr. Livingston Diones   DVT Prophylaxis: SCDs   Family Communication: none   Severity of Illness: The appropriate patient status for this patient is OBSERVATION. Observation status is judged to be reasonable and necessary in order to provide the required intensity of service to ensure the patient's safety. The patient's presenting symptoms, physical exam findings, and initial radiographic and laboratory data in the context of their medical condition is felt to place them at decreased risk for further clinical  deterioration. Furthermore, it is anticipated that the patient will be medically stable for discharge from the hospital within 2 midnights of admission.   Author: Orland Mustard, MD 09/01/2022 10:09 PM  For on call review [email or web address]. '

## 2022-09-01 NOTE — Assessment & Plan Note (Signed)
Stable 2.4cm left ovarian cyst. No f/u recommended Follows with GYN.

## 2022-09-01 NOTE — Assessment & Plan Note (Signed)
History of sinus tachycardia since at least 2021 On atenolol and has not taken this today Saw cardiology in the past Could have some rebound tachycardia from missed atenlol Continue IVF, give IV dose of metoprolol since NPO  Check TSH/magnesium

## 2022-09-01 NOTE — Assessment & Plan Note (Addendum)
42 year old presenting to ED with acute onset of bloody emesis today with +fecal occult and 2 episodes of coffee ground emeis in ED. She is POD #4 from laparoscopic cholecystectomy.  -obs to stepdown -GI consulted, asked EDP to call as may need octreotide gtt with continued emesis and to be seen more urgently  -continue protonix gtt  -NPO, plan for EGD tomorrow unless needs more urgently tonight  -IVF -trend CBC q 6, transfuse if hgb <7.0  -denies frequent NSAID use. Check UDS. Does drink a lot of caffiene and hx of recent surgery  -history of EGD in 2020 with LA grade A esophagitis. Normal stomach and duodenum.

## 2022-09-01 NOTE — Assessment & Plan Note (Signed)
Hx of low B12 Continue gabapentin and cymbalta when no longer NPO  Repeat B12 pending

## 2022-09-01 NOTE — ED Triage Notes (Signed)
Cholecystectomy 4 days ago here, started vomiting blood today. Denies pain

## 2022-09-01 NOTE — Assessment & Plan Note (Addendum)
Continue daily B12-has not been taking this

## 2022-09-01 NOTE — ED Triage Notes (Signed)
Tachycardic in triage at 140-150.

## 2022-09-01 NOTE — ED Provider Notes (Signed)
[place] EMERGENCY DEPARTMENT AT [facility] Provider Note   CSN: [id number] Arrival date & time: 09/01/22  1707     History  Chief Complaint  Patient presents with   Post-op Problem   Hematemesis   Tachycardia    Melissa Walker is a 42 y.o. female with past medical history significant for tobacco abuse, hypertension, anxiety, PUD, GERD, opioid dependence presents to the ED complaining of hematemesis with nausea that began earlier today.  Patient had a cholecystectomy done 4 days ago and was doing otherwise well.  She reports she had a single episode of vomiting with a large amount of blood.  Patient is not currently complaining of any abdominal pain and has not vomited since earlier in the day because she has not drank or ate anything.  She has passed a small amount of stool, which she describes as normal in color.  Denies melena, hematochezia, diarrhea, fever, syncope, weakness, lightheadedness, dizziness.       Home Medications Prior to Admission medications   Medication Sig Start Date End Date Taking? Authorizing Provider  acetaminophen (TYLENOL) 500 MG tablet Take 2 tablets (1,000 mg total) by mouth 4 (four) times daily for 365 doses. 08/28/22 11/28/22  Diamantina Monks, MD  atenolol (TENORMIN) 25 MG tablet Take 1 tablet (25 mg total) by mouth daily. 03/31/22   Eloisa Northern, MD  cyclobenzaprine (FEXMID) 7.5 MG tablet Take 1 tablet (7.5 mg total) by mouth 2 (two) times daily between meals as needed for muscle spasms. 03/31/22   Eloisa Northern, MD  diclofenac (VOLTAREN) 75 MG EC tablet Take 1 tablet (75 mg total) by mouth 2 (two) times daily. 06/05/22   Eloisa Northern, MD  docusate sodium (COLACE) 100 MG capsule Take 1 capsule (100 mg total) by mouth 2 (two) times daily. 08/28/22 08/28/23  Diamantina Monks, MD  DULoxetine (CYMBALTA) 60 MG capsule TAKE 1 CAPSULE(60 MG) BY MOUTH DAILY 03/18/21   Verdene Lennert, MD  famotidine (PEPCID) 20 MG tablet Take 1 tablet (20 mg total) by mouth  daily as needed for heartburn or indigestion. Patient not taking: Reported on 08/27/2022 01/08/20   Verdene Lennert, MD  fluticasone [facility]) 50 MCG/ACT nasal spray Place 1 spray into both nostrils daily. Patient not taking: Reported on 08/27/2022 03/22/18 03/31/22  Nyra Market, MD  gabapentin (NEURONTIN) 300 MG capsule Take 300 mg by mouth 2 (two) times daily.    [provider]  HYDROcodone-acetaminophen (NORCO) 5-325 MG tablet Take 1 tablet by mouth every 8 (eight) hours as needed for moderate pain or severe pain. Patient not taking: Reported on 08/27/2022 05/09/21   Verdene Lennert, MD  HYDROcodone-acetaminophen (NORCO) 7.5-325 MG tablet Take 1 tablet by mouth every 8 (eight) hours as needed for moderate pain.    [provider]  hydrOXYzine (ATARAX) 25 MG tablet Take 1 tablet (25 mg total) by mouth every 8 (eight) hours as needed for itching. Patient not taking: Reported on 08/27/2022 05/15/21   Verdene Lennert, MD  ibuprofen (ADVIL) 600 MG tablet Take 1 tablet (600 mg total) by mouth 4 (four) times daily. 08/28/22   Diamantina Monks, MD  lidocaine (LIDODERM) 5 % Place 1 patch onto the skin as needed. Remove & Discard patch within 12 hours or as directed by MD 04/09/22   Eloisa Northern, MD  losartan (COZAAR) 50 MG tablet Take 1 tablet (50 mg total) by mouth daily. 03/31/22   Eloisa Northern, MD  methocarbamol (ROBAXIN-750) 750 MG tablet Take 1 tablet (750  mg total) by mouth 4 (four) times daily. 08/28/22   Diamantina Monks, MD  norethindrone (MICRONOR) 0.35 MG tablet Take 1 tablet (0.35 mg total) by mouth daily. Patient not taking: Reported on 08/27/2022 07/22/20   Federico Flake, MD  oxyCODONE (ROXICODONE) 5 MG immediate release tablet Take 1 tablet (5 mg total) by mouth every 4 (four) hours as needed for severe pain. 08/28/22 08/28/23  Diamantina Monks, MD  traZODone (DESYREL) 100 MG tablet TAKE 1 TABLET(100 MG) BY MOUTH AT BEDTIME Patient taking differently: Take 100 mg by mouth at  bedtime. 07/15/22   Eloisa Northern, MD  triamcinolone cream (KENALOG) 0.5 % Apply 1 application topically 2 (two) times daily. Patient not taking: Reported on 08/27/2022 05/06/20   Evlyn Kanner, MD      Allergies    Mobic [meloxicam], Tape, Wound dressing adhesive, Penicillins, and Nicotine    Review of Systems   Review of Systems  Constitutional:  Negative for fever.  Gastrointestinal:  Positive for nausea and vomiting (hematemesis). Negative for abdominal pain, blood in stool and diarrhea.  Neurological:  Negative for dizziness, syncope, weakness and light-headedness.    Physical Exam Updated Vital Signs BP 135/80 (BP Location: Right Arm)   Pulse 96   Temp 98.4 F (36.9 C) (Oral)   Resp 18   Ht 5\' 3"  (1.6 m)   Wt 59 kg   LMP 01/17/2021   SpO2 100%   BMI 23.03 kg/m  Physical Exam Vitals and nursing note reviewed.  Constitutional:      General: She is not in acute distress.    Appearance: She is not ill-appearing.  HENT:     Mouth/Throat:     Mouth: Mucous membranes are moist.     Pharynx: Oropharynx is clear.  Cardiovascular:     Rate and Rhythm: Regular rhythm. Tachycardia present.     Pulses: Normal pulses.     Heart sounds: Normal heart sounds.  Pulmonary:     Effort: Pulmonary effort is normal. No respiratory distress.     Breath sounds: Normal breath sounds and air entry.  Abdominal:     General: Bowel sounds are normal. There is distension.     Palpations: Abdomen is soft.     Tenderness: There is no abdominal tenderness.     Comments: Multiple laparoscopic incision sites that appear to be healing well and do not have signs of infection.  There is some ecchymosis around incision sites, but no erythema or increased warmth.  Abdomen is mildly distended with some tympany.  Skin:    General: Skin is warm and dry.     Capillary Refill: Capillary refill takes less than 2 seconds.     Coloration: Skin is not pale.  Neurological:     Mental Status: She is alert.  Mental status is at baseline.  Psychiatric:        Mood and Affect: Mood normal.        Behavior: Behavior normal.     ED Results / Procedures / Treatments   Labs (all labs ordered are listed, but only abnormal results are displayed) Labs Reviewed  COMPREHENSIVE METABOLIC PANEL - Abnormal; Notable for the following components:      Result Value   Potassium 3.4 (*)    Glucose, Bld 116 (*)    BUN 21 (*)    Calcium 8.2 (*)    Total Protein 6.0 (*)    Albumin 2.9 (*)    ALT 68 (*)    Alkaline  Phosphatase 129 (*)    All other components within normal limits  CBC WITH DIFFERENTIAL/PLATELET - Abnormal; Notable for the following components:   WBC 11.7 (*)    RBC 2.81 (*)    Hemoglobin 9.4 (*)    HCT 28.3 (*)    MCV 100.7 (*)    Platelets 440 (*)    Neutro Abs 8.6 (*)    All other components within normal limits  I-STAT CHEM 8, ED - Abnormal; Notable for the following components:   Glucose, Bld 111 (*)    Hemoglobin 9.9 (*)    HCT 29.0 (*)    All other components within normal limits  POC OCCULT BLOOD, ED - Abnormal; Notable for the following components:   Fecal Occult Bld POSITIVE (*)    All other components within normal limits  TYPE AND SCREEN    EKG EKG Interpretation Date/Time:  Tuesday September 01 2022 17:18:55 EDT Ventricular Rate:  130 PR Interval:  145 QRS Duration:  77 QT Interval:  288 QTC Calculation: 424 R Axis:   68  Text Interpretation: Sinus tachycardia Confirmed by Vanetta Mulders [phone]) on 09/01/2022 5:23:33 PM  Radiology CT ABDOMEN PELVIS W CONTRAST  Result Date: 09/01/2022 CLINICAL DATA:  Status post cholecystectomy 4 days ago with right-sided abdominal pain, initial encounter EXAM: CT ABDOMEN AND PELVIS WITH CONTRAST TECHNIQUE: Multidetector CT imaging of the abdomen and pelvis was performed using the standard protocol following bolus administration of intravenous contrast. RADIATION DOSE REDUCTION: This exam was performed according to the departmental  dose-optimization program which includes automated exposure control, adjustment of the mA and/or kV according to patient size and/or use of iterative reconstruction technique. CONTRAST:  OMNIPAQUE IOHEXOL 300 MG/ML  SOLN COMPARISON:  None Available. FINDINGS: Lower chest: No acute abnormality. Hepatobiliary: Gallbladder has been surgically removed. Minimal fluid is noted in the gallbladder fossa consistent with the recent surgery. No focal abnormality to suggest bile leak or biloma is noted. Liver is fatty infiltrated. Pancreas: Unremarkable. No pancreatic ductal dilatation or surrounding inflammatory changes. Spleen: Normal in size without focal abnormality. Adrenals/Urinary Tract: Adrenal glands are within normal limits. Kidneys are well visualized bilaterally. No renal calculi or obstructive changes are noted. Bladder is partially distended. Stomach/Bowel: The appendix is within normal limits. No obstructive or inflammatory changes of the colon are seen. Small bowel and stomach are within normal limits. Vascular/Lymphatic: No significant vascular findings are present. No enlarged abdominal or pelvic lymph nodes. Reproductive: Uterus and bilateral adnexa are unremarkable. 2.4 cm simple cyst is noted in the left ovary. This is stable from the prior exam. Left tubal ligation clip is displaced. This is stable from the prior exam. Other: No abdominal wall hernia or abnormality. No abdominopelvic ascites. Musculoskeletal: Postsurgical changes are noted in the lower lumbar spine. No hardware abnormality is seen. IMPRESSION: Status post cholecystectomy. Mild fluid is noted within the gallbladder fossa consistent with the recent surgery although no biloma or findings of bile leak are seen. Stable 2.4 cm cyst in the left ovary.  No follow-up is recommended. Stable dislodged left tubal ligation clip. Electronically Signed   By: Alcide Clever M.D.   On: 09/01/2022 19:29    Procedures Procedures    Medications  Ordered in ED Medications  pantoprozole (PROTONIX) 80 mg /NS 100 mL infusion (has no administration in time range)  ondansetron (ZOFRAN) injection 4 mg (has no administration in time range)  lactated ringers bolus 1,000 mL (0 mLs Intravenous Stopped 09/01/22 2023)  ondansetron (ZOFRAN) injection 4  mg (4 mg Intravenous Given 09/01/22 1844)  iohexol (OMNIPAQUE) 300 MG/ML solution 100 mL (100 mLs Intravenous Contrast Given 09/01/22 1906)  pantoprazole (PROTONIX) injection 40 mg (40 mg Intravenous Given 09/01/22 1953)    ED Course/ Medical Decision Making/ A&P                             Medical Decision Making Amount and/or Complexity of Data Reviewed Labs: ordered. Radiology: ordered.  Risk Prescription drug management. Decision regarding hospitalization.   This patient presents to the ED with chief complaint(s) of hematemesis with nausea with pertinent past medical history of recent cholecystectomy, PUD, GERD.  The complaint involves an extensive differential diagnosis and also carries with it a high risk of complications and morbidity.    The differential diagnosis includes upper GI bleed, gastritis, esophagitis, PUD, post-operative complication   The initial plan is to obtain baseline labs, type and screen  Additional history obtained: Records reviewed previous admission documents -patient had lap cholecystectomy performed on 08/28/2022 by Dr. Bedelia Person.  Patient was discharged home on the same day.  Initial Assessment:   On exam, patient is sitting up in bed and does not appear to be in acute distress.  Skin is warm and dry, no pallor.  Heart rate is tachycardic around 110 with regular rhythm.  Pulmonary effort is normal.  Abdomen is mildly distended and tympanic with laparoscopic incision sites identified, appear to be healing well.  There is some ecchymosis around surgical sites, but no erythema or increased warmth.  Independent ECG/labs interpretation:  The following labs were  independently interpreted:  CBC with anemia, hemoglobin of 9.4 is down from 12.5 from blood work 5 days prior.  There is also mild thrombocytosis and leukocytosis.  Metabolic panel with mild hypokalemia, hypocalcemia and elevated ALT and alk phos.  Anion gap normal.  Renal function normal. Hemoccult positive  Independent visualization and interpretation of imaging: I independently visualized the following imaging with scope of interpretation limited to determining acute life threatening conditions related to emergency care: CT abdomen pelvis, which revealed status postcholecystectomy, there is some fluid in the gallbladder fossa.  I agree with radiologist interpretation.  Treatment and Reassessment: Patient given IV fluids and Zofran.  Patient also given bolus and started on infusion of Protonix for upper GI bleed.  Patient attempted to drink, and began having episodes of vomiting again with coffee-ground emesis.  Patient also does have some discomfort.  Will give her a dose of Zofran and some IV fentanyl.  I feel that patient would benefit from hospital admission with evaluation by GI for upper GI bleed.    Consultations obtained:   I requested consultation with on-call hospitalist provider and spoke with Dr. Orland Mustard who agreed with hospital admission.  I also spoke with on-call [facility] provider Dr. Leonides Schanz who recommended having patient remain n.p.o., keep her on Protonix drip, and plan to do EGD in the morning.  Should patient become more hemodynamically unstable, GI team will consider doing scope earlier.  Disposition:   Patient to be admitted to the hospital for upper GI bleed with GI following.    Social Determinants of Health:   Patient's  tobacco abuse  increases the complexity of managing their presentation         Final Clinical Impression(s) / ED Diagnoses Final diagnoses:  Hematemesis with nausea    Rx / DC Orders ED Discharge Orders     None  Lenard Simmer, PA-C 09/01/22 2140    Linwood Dibbles, MD 09/01/22 2352

## 2022-09-01 NOTE — Assessment & Plan Note (Signed)
Hgb 9.4 today, baseline of 11.4-12.  Likely secondary to UGIB with hematemesis and + fecal occult  Cbc q 6 hours Transfuse if hgb <7

## 2022-09-02 ENCOUNTER — Encounter (HOSPITAL_COMMUNITY)
Admission: EM | Disposition: A | Discharge status: Home / Self Care | Payer: Self-pay | Source: Home / Self Care | Attending: Internal Medicine

## 2022-09-02 ENCOUNTER — Encounter (HOSPITAL_COMMUNITY): Payer: Self-pay | Admitting: Family Medicine

## 2022-09-02 ENCOUNTER — Observation Stay (HOSPITAL_COMMUNITY): Payer: Medicaid Other | Admitting: Anesthesiology

## 2022-09-02 ENCOUNTER — Observation Stay (HOSPITAL_BASED_OUTPATIENT_CLINIC_OR_DEPARTMENT_OTHER): Payer: Medicaid Other | Admitting: Anesthesiology

## 2022-09-02 DIAGNOSIS — D649 Anemia, unspecified: Secondary | ICD-10-CM

## 2022-09-02 DIAGNOSIS — K920 Hematemesis: Secondary | ICD-10-CM

## 2022-09-02 DIAGNOSIS — Z9049 Acquired absence of other specified parts of digestive tract: Secondary | ICD-10-CM | POA: Diagnosis not present

## 2022-09-02 DIAGNOSIS — K264 Chronic or unspecified duodenal ulcer with hemorrhage: Secondary | ICD-10-CM | POA: Insufficient documentation

## 2022-09-02 DIAGNOSIS — I10 Essential (primary) hypertension: Secondary | ICD-10-CM | POA: Diagnosis present

## 2022-09-02 DIAGNOSIS — D62 Acute posthemorrhagic anemia: Secondary | ICD-10-CM | POA: Diagnosis present

## 2022-09-02 DIAGNOSIS — F17290 Nicotine dependence, other tobacco product, uncomplicated: Secondary | ICD-10-CM | POA: Diagnosis present

## 2022-09-02 DIAGNOSIS — F32A Depression, unspecified: Secondary | ICD-10-CM | POA: Diagnosis present

## 2022-09-02 DIAGNOSIS — K921 Melena: Secondary | ICD-10-CM

## 2022-09-02 DIAGNOSIS — K922 Gastrointestinal hemorrhage, unspecified: Secondary | ICD-10-CM | POA: Diagnosis present

## 2022-09-02 DIAGNOSIS — R7401 Elevation of levels of liver transaminase levels: Secondary | ICD-10-CM | POA: Diagnosis present

## 2022-09-02 DIAGNOSIS — F419 Anxiety disorder, unspecified: Secondary | ICD-10-CM | POA: Diagnosis present

## 2022-09-02 DIAGNOSIS — F17210 Nicotine dependence, cigarettes, uncomplicated: Secondary | ICD-10-CM

## 2022-09-02 DIAGNOSIS — E538 Deficiency of other specified B group vitamins: Secondary | ICD-10-CM | POA: Diagnosis present

## 2022-09-02 DIAGNOSIS — Z23 Encounter for immunization: Secondary | ICD-10-CM | POA: Diagnosis not present

## 2022-09-02 DIAGNOSIS — Z886 Allergy status to analgesic agent status: Secondary | ICD-10-CM | POA: Diagnosis not present

## 2022-09-02 DIAGNOSIS — K2100 Gastro-esophageal reflux disease with esophagitis, without bleeding: Secondary | ICD-10-CM | POA: Diagnosis present

## 2022-09-02 DIAGNOSIS — D72829 Elevated white blood cell count, unspecified: Secondary | ICD-10-CM | POA: Diagnosis present

## 2022-09-02 DIAGNOSIS — D75839 Thrombocytosis, unspecified: Secondary | ICD-10-CM | POA: Diagnosis present

## 2022-09-02 DIAGNOSIS — F1120 Opioid dependence, uncomplicated: Secondary | ICD-10-CM | POA: Diagnosis present

## 2022-09-02 DIAGNOSIS — E876 Hypokalemia: Secondary | ICD-10-CM | POA: Diagnosis present

## 2022-09-02 DIAGNOSIS — K269 Duodenal ulcer, unspecified as acute or chronic, without hemorrhage or perforation: Secondary | ICD-10-CM | POA: Diagnosis not present

## 2022-09-02 DIAGNOSIS — G629 Polyneuropathy, unspecified: Secondary | ICD-10-CM | POA: Diagnosis present

## 2022-09-02 DIAGNOSIS — M545 Low back pain, unspecified: Secondary | ICD-10-CM | POA: Diagnosis present

## 2022-09-02 DIAGNOSIS — G894 Chronic pain syndrome: Secondary | ICD-10-CM | POA: Diagnosis present

## 2022-09-02 DIAGNOSIS — R000 Tachycardia, unspecified: Secondary | ICD-10-CM | POA: Diagnosis present

## 2022-09-02 DIAGNOSIS — M549 Dorsalgia, unspecified: Secondary | ICD-10-CM | POA: Diagnosis present

## 2022-09-02 DIAGNOSIS — Z8249 Family history of ischemic heart disease and other diseases of the circulatory system: Secondary | ICD-10-CM | POA: Diagnosis not present

## 2022-09-02 DIAGNOSIS — Z79899 Other long term (current) drug therapy: Secondary | ICD-10-CM | POA: Diagnosis not present

## 2022-09-02 HISTORY — PX: ESOPHAGOGASTRODUODENOSCOPY (EGD) WITH PROPOFOL: SHX5813

## 2022-09-02 HISTORY — PX: HEMOSTASIS CONTROL: SHX6838

## 2022-09-02 LAB — COMPREHENSIVE METABOLIC PANEL
ALT: 56 U/L — ABNORMAL HIGH (ref 0–44)
AST: 20 U/L (ref 15–41)
Albumin: 2.9 g/dL — ABNORMAL LOW (ref 3.5–5.0)
Alkaline Phosphatase: 115 U/L (ref 38–126)
Anion gap: 12 (ref 5–15)
BUN: 22 mg/dL — ABNORMAL HIGH (ref 6–20)
CO2: 24 mmol/L (ref 22–32)
Calcium: 8.1 mg/dL — ABNORMAL LOW (ref 8.9–10.3)
Chloride: 102 mmol/L (ref 98–111)
Creatinine, Ser: 0.64 mg/dL (ref 0.44–1.00)
GFR, Estimated: >60 mL/min (ref >60)
Glucose, Bld: 102 mg/dL — ABNORMAL HIGH (ref 70–99)
Potassium: 3.5 mmol/L (ref 3.5–5.1)
Sodium: 138 mmol/L (ref 135–145)
Total Bilirubin: 0.6 mg/dL (ref 0.3–1.2)
Total Protein: 5.6 g/dL — ABNORMAL LOW (ref 6.5–8.1)

## 2022-09-02 LAB — RAPID URINE DRUG SCREEN, HOSP PERFORMED
Amphetamines: NOT DETECTED
Barbiturates: NOT DETECTED
Benzodiazepines: NOT DETECTED
Cocaine: NOT DETECTED
Opiates: NOT DETECTED
Tetrahydrocannabinol: NOT DETECTED

## 2022-09-02 LAB — TYPE AND SCREEN
Antibody Screen: NEGATIVE
Unit division: 0

## 2022-09-02 LAB — CBC
HCT: 24.8 % — ABNORMAL LOW (ref 36.0–46.0)
HCT: 18.7 % — ABNORMAL LOW (ref 36.0–46.0)
Hemoglobin: 8.1 g/dL — ABNORMAL LOW (ref 12.0–15.0)
Hemoglobin: 6.1 g/dL — CL (ref 12.0–15.0)
MCH: 33.1 pg (ref 26.0–34.0)
MCH: 33 pg (ref 26.0–34.0)
MCHC: 32.7 g/dL (ref 30.0–36.0)
MCHC: 32.6 g/dL (ref 30.0–36.0)
MCV: 101.2 fL — ABNORMAL HIGH (ref 80.0–100.0)
MCV: 101.1 fL — ABNORMAL HIGH (ref 80.0–100.0)
Platelets: 425 10*3/uL — ABNORMAL HIGH (ref 150–400)
Platelets: 340 10*3/uL (ref 150–400)
RBC: 2.45 MIL/uL — ABNORMAL LOW (ref 3.87–5.11)
RBC: 1.85 MIL/uL — ABNORMAL LOW (ref 3.87–5.11)
RDW: 12.8 % (ref 11.5–15.5)
RDW: 13.2 % (ref 11.5–15.5)
WBC: 11.6 10*3/uL — ABNORMAL HIGH (ref 4.0–10.5)
WBC: 8.8 10*3/uL (ref 4.0–10.5)
nRBC: 0 % (ref 0.0–0.2)
nRBC: 0 % (ref 0.0–0.2)

## 2022-09-02 LAB — BPAM RBC
ISSUE DATE / TIME: 202407031056
ISSUE DATE / TIME: 202407031245

## 2022-09-02 LAB — TSH: TSH: 0.268 u[IU]/mL — ABNORMAL LOW (ref 0.350–4.500)

## 2022-09-02 LAB — IRON AND TIBC
Iron: 78 ug/dL (ref 28–170)
Saturation Ratios: 36 % — ABNORMAL HIGH (ref 10.4–31.8)
TIBC: 214 ug/dL — ABNORMAL LOW (ref 250–450)
UIBC: 136 ug/dL

## 2022-09-02 LAB — HEMOGLOBIN AND HEMATOCRIT, BLOOD
HCT: 29.2 % — ABNORMAL LOW (ref 36.0–46.0)
Hemoglobin: 9.9 g/dL — ABNORMAL LOW (ref 12.0–15.0)

## 2022-09-02 LAB — PREPARE RBC (CROSSMATCH)

## 2022-09-02 LAB — FERRITIN: Ferritin: 34 ng/mL (ref 11–307)

## 2022-09-02 LAB — MRSA NEXT GEN BY PCR, NASAL: MRSA by PCR Next Gen: NOT DETECTED

## 2022-09-02 LAB — VITAMIN B12: Vitamin B-12: 364 pg/mL (ref 180–914)

## 2022-09-02 SURGERY — ESOPHAGOGASTRODUODENOSCOPY (EGD) WITH PROPOFOL
Anesthesia: Monitor Anesthesia Care

## 2022-09-02 MED ORDER — MAGNESIUM SULFATE 2 GM/50ML IV SOLN
2.0000 g | Freq: Once | INTRAVENOUS | Status: AC
  Administered 2022-09-02: 2 g via INTRAVENOUS
  Filled 2022-09-02: qty 50

## 2022-09-02 MED ORDER — PROPOFOL 10 MG/ML IV BOLUS
INTRAVENOUS | Status: DC | PRN
  Administered 2022-09-02 (×4): 40 mg via INTRAVENOUS
  Administered 2022-09-02: 80 mg via INTRAVENOUS
  Administered 2022-09-02 (×3): 40 mg via INTRAVENOUS

## 2022-09-02 MED ORDER — PNEUMOCOCCAL 20-VAL CONJ VACC 0.5 ML IM SUSY
0.5000 mL | PREFILLED_SYRINGE | INTRAMUSCULAR | Status: AC
  Administered 2022-09-03: 0.5 mL via INTRAMUSCULAR
  Filled 2022-09-02 (×2): qty 0.5

## 2022-09-02 MED ORDER — CHLORHEXIDINE GLUCONATE CLOTH 2 % EX PADS
6.0000 | MEDICATED_PAD | Freq: Every day | CUTANEOUS | Status: DC
  Administered 2022-09-02 – 2022-09-03 (×2): 6 via TOPICAL

## 2022-09-02 MED ORDER — ORAL CARE MOUTH RINSE: 15.0000 mL | OROMUCOSAL | Status: DC | PRN

## 2022-09-02 MED ORDER — LACTATED RINGERS IV SOLN
INTRAVENOUS | Status: AC | PRN
  Administered 2022-09-02: 1000 mL via INTRAVENOUS

## 2022-09-02 MED ORDER — SODIUM CHLORIDE 0.9% IV SOLUTION: Freq: Once | INTRAVENOUS | Status: AC

## 2022-09-02 MED ORDER — PHENYLEPHRINE 80 MCG/ML (10ML) SYRINGE FOR IV PUSH (FOR BLOOD PRESSURE SUPPORT)
PREFILLED_SYRINGE | INTRAVENOUS | Status: DC | PRN
  Administered 2022-09-02: 160 ug via INTRAVENOUS

## 2022-09-02 MED ORDER — LIDOCAINE HCL (CARDIAC) PF 100 MG/5ML IV SOSY
PREFILLED_SYRINGE | INTRAVENOUS | Status: DC | PRN
  Administered 2022-09-02: 100 mg via INTRAVENOUS

## 2022-09-02 SURGICAL SUPPLY — 15 items

## 2022-09-02 NOTE — Op Note (Addendum)
[facility] Patient Name: Melissa Walker Procedure Date: 09/02/2022 MRN: [id number] Attending MD: Starr Lake. Myrtie Neither , MD, [id number] Date of Birth: Jul 07, 1980 CSN: [id number] Age: 42 Admit Type: Inpatient Procedure:                Upper GI endoscopy Indications:              Acute post hemorrhagic anemia, Hematemesis, Melena                           clinical details in inpatient GI consult note                           Patient known to be H pylori negative from 2020                            EGD/Bx                           Cnronic NSAID (Aleve) use Providers:                Starr Lake. Myrtie Neither, MD, Fransisca Connors, Martha Clan, RN, Kandice Robinsons, Technician Referring MD:             [facility] at [facility] Medicines:                Monitored Anesthesia Care Complications:            No immediate complications. Estimated Blood Loss:     Estimated blood loss: none. Procedure:                Pre-Anesthesia Assessment:                           - Prior to the procedure, a History and Physical                            was performed, and patient medications and                            allergies were reviewed. The patient's tolerance of                            previous anesthesia was also reviewed. The risks                            and benefits of the procedure and the sedation                            options and risks were discussed with the patient.                            All questions were answered, and informed consent  was obtained. Prior Anticoagulants: The patient has                            taken no anticoagulant or antiplatelet agents. ASA                            Grade Assessment: III - A patient with severe                            systemic disease. After reviewing the risks and                            benefits, the patient was deemed in satisfactory                             condition to undergo the procedure.                           After obtaining informed consent, the endoscope was                            passed under direct vision. Throughout the                            procedure, the patient's blood pressure, pulse, and                            oxygen saturations were monitored continuously. The                            [id number]-[id number] ([id number]) Olympus endoscope was introduced                            through the mouth, and advanced to the second part                            of duodenum. The upper GI endoscopy was                            accomplished without difficulty. The patient                            tolerated the procedure well. Scope In: Scope Out: Findings:      The esophagus was normal.      A (probable) healed ulcer was found on the greater curvature of the       gastric antrum.      The exam of the stomach was otherwise normal.      The cardia and gastric fundus were normal on retroflexion.      One non-bleeding deeply cratered duodenal ulcer with adherent clot was       found in the duodenal bulb. The lesion was 18-20 mm in largest dimension       and along the superior aspect of the bulb extending into the pylorus       (  some edema and anatomic distortion). Clot was densely adherent and       could not be dislodged with lavage. No clear culprit vessel amenable to       intervention. Scope passed easily to the distal duodenum. To treat the       bleeding lesion, one 3ml dose if hemostatic gel (Purastat) was deployed.      The exam of the duodenum was otherwise normal. Impression:               - Normal esophagus.                           - Normal stomach.                           - Non-bleeding duodenal ulcer with adherent clot.                            Hemostatic gel applied.                           - No specimens collected. Moderate Sedation:      MAC sedation used Recommendation:           -  Return patient to ICU for ongoing care.                           - NPO except ice chips and small sips of water                           Q 6 hr Hgn and Hct beginning 2-3 hrs after                            completion of current PRBC transfusion                           Keep head of bed elevated 30 degrees to decrease                            aspiration risk                           Continue protonix drip 8 mg/hr                           Absolutely no NSAID use going forward                           High rebleeding risk - if patient develops                            hemodynamic instability and demonstrates signs of                            recurrent brisk bleeding, call GI and IR. There is  no further endoscopic intervention for this ulcer.                           - The findings and recommendations were discussed                            with the patient's family.                           - The findings and recommendations were discussed                            with the patient. Procedure Code(s):        --- Professional ---                           [phone], Esophagogastroduodenoscopy, flexible,                            transoral; with control of bleeding, any method Diagnosis Code(s):        --- Professional ---                           K26.4, Chronic or unspecified duodenal ulcer with                            hemorrhage                           D62, Acute posthemorrhagic anemia                           K92.0, Hematemesis                           K92.1, Melena (includes Hematochezia) CPT copyright 2022 American [employer] Association. All rights reserved. The codes documented in this report are preliminary and upon coder review may  be revised to meet current compliance requirements. Laurine Kuyper L. Myrtie Neither, MD 09/02/2022 2:08:36 PM This report has been signed electronically. Number of Addenda: 0

## 2022-09-02 NOTE — Interval H&P Note (Signed)
History and Physical Interval Note:  09/02/2022 1:21 PM  Melissa Walker  has presented today for surgery, with the diagnosis of Hematemesis.  The various methods of treatment have been discussed with the patient and family. After consideration of risks, benefits and other options for treatment, the patient has consented to  Procedure(s): ESOPHAGOGASTRODUODENOSCOPY (EGD) WITH PROPOFOL (N/A) as a surgical intervention.  The patient's history has been reviewed, patient examined, no change in status, stable for surgery.  I have reviewed the patient's chart and labs.  Questions were answered to the patient's satisfaction.     Charlie Pitter III

## 2022-09-02 NOTE — Anesthesia Postprocedure Evaluation (Signed)
Anesthesia Post Note  Patient: Melissa Walker  Procedure(s) Performed: ESOPHAGOGASTRODUODENOSCOPY (EGD) WITH PROPOFOL HEMOSTASIS CONTROL     Patient location during evaluation: Endoscopy Anesthesia Type: MAC Level of consciousness: awake and alert Pain management: pain level controlled Vital Signs Assessment: post-procedure vital signs reviewed and stable Respiratory status: spontaneous breathing, nonlabored ventilation and respiratory function stable Cardiovascular status: stable and blood pressure returned to baseline Postop Assessment: no apparent nausea or vomiting Anesthetic complications: no  No notable events documented.  Last Vitals:  Vitals:   09/02/22 1409 09/02/22 1418  BP: 123/61 (!) 129/51  Pulse: (!) 102 94  Resp: 15 20  Temp:  36.9 C  SpO2:  98%    Last Pain:  Vitals:   09/02/22 1418  TempSrc:   PainSc: 0-No pain                 Teven Mittman,W. EDMOND

## 2022-09-02 NOTE — Transfer of Care (Signed)
Immediate Anesthesia Transfer of Care Note  Patient: Melissa Walker  Procedure(s) Performed: ESOPHAGOGASTRODUODENOSCOPY (EGD) WITH PROPOFOL HEMOSTASIS CONTROL  Patient Location: Endoscopy Unit  Anesthesia Type:MAC  Level of Consciousness: awake and drowsy  Airway & Oxygen Therapy: Patient Spontanous Breathing  Post-op Assessment: Report given to RN and Post -op Vital signs reviewed and stable  Post vital signs: Reviewed and stable  Last Vitals:  Vitals Value Taken Time  BP    Temp    Pulse    Resp    SpO2      Last Pain:  Vitals:   09/02/22 1314  TempSrc: Temporal  PainSc: 0-No pain      Patients Stated Pain Goal: 0 (09/02/22 0900)  Complications: No notable events documented.

## 2022-09-02 NOTE — Discharge Summary (Signed)
Patient ID: Melissa Walker [id number] 1981/01/01 42 y.o.  Admit date: 08/27/2022 Discharge date: 08/28/22   Discharge Diagnosis Symptomatic Cholelithiasis s/p Laparoscopic Cholecystectomy by Dr. Bedelia Person on 08/28/22  Consultants None  Reason for Admission: Melissa Walker is a 42 yo female who presented to the ED overnight with RUQ abdominal pain, nausea, and vomiting. She has been having right sided abdominal pain for several months and recently saw her gynecologist. A pelvic US on 6/24 did not show any acute abnormalities. She presented to the ED two days ago with worsening RUQ pain and nausea. LFTs and lipase were normal. A CT scan showed a large stone within the gallbladder but no signs of acute cholecystitis, and no other acute findings. She was discharged with outpatient general surgery follow up. However her pain persisted and last night she returned to the ED. WBC was very mildly elevated to 11. A RUQ [place] again showed a large gallstone but no signs of acute cholecystitis. She was treated symptomatically but her pain persisted. General surgery was consulted for admission. She continues to have RUQ pain and nausea.    She has not had any prior abdominal surgeries. She has chronic pain for which she is on Norco, and reports she has a pain management contract with her chronic pain specialist.  Procedures Dr. Bedelia Person - Laparoscopic Cholecystectomy - 08/28/22  Hospital Course:  Patient presented as above for symptomatic cholelithiasis. She underwent Laparoscopic Cholecystectomy by Dr. Bedelia Person on 6/28. Tolerated the procedure well. She was discharged from PACU. I was not directly involved in this patient's care and did not see the patient during their hospital stay, therefore the information in this discharge summary was taken entirely from the chart.    Allergies as of 08/28/2022       Reactions   Mobic [meloxicam] Other (See Comments)   Stomach bleeding    Tape Rash   Nicotine patches    Wound Dressing Adhesive Rash   Nicotine patches   Penicillins Other (See Comments)   uncle highly allergic so was told whole family should not take it Did it involve swelling of the face/tongue/throat, SOB, or low BP? Unknown Did it involve sudden or severe rash/hives, skin peeling, or any reaction on the inside of your mouth or nose? Unknown Did you need to seek medical attention at a hospital or doctor's office? Unknown When did it last happen?      Never taken before If all above answers are "NO", may proceed with cephalosporin use. Unknown, patient has never taken the medication but states her uncle has an allergy to it.    Nicotine Rash   Transdermal Patch        Medication List     TAKE these medications    acetaminophen 500 MG tablet Commonly known as: TYLENOL Take 2 tablets (1,000 mg total) by mouth 4 (four) times daily for 365 doses.   atenolol 25 MG tablet Commonly known as: TENORMIN Take 1 tablet (25 mg total) by mouth daily.   diclofenac 75 MG EC tablet Commonly known as: VOLTAREN Take 1 tablet (75 mg total) by mouth 2 (two) times daily.   docusate sodium 100 MG capsule Commonly known as: Colace Take 1 capsule (100 mg total) by mouth 2 (two) times daily.   DULoxetine 60 MG capsule Commonly known as: CYMBALTA TAKE 1 CAPSULE(60 MG) BY MOUTH DAILY   gabapentin 300 MG capsule Commonly known as: NEURONTIN Take 300 mg by mouth 2 (two) times daily.  HYDROcodone-acetaminophen 7.5-325 MG tablet Commonly known as: NORCO Take 1 tablet by mouth every 8 (eight) hours as needed for moderate pain.   ibuprofen 600 MG tablet Commonly known as: ADVIL Take 1 tablet (600 mg total) by mouth 4 (four) times daily.   lidocaine 5 % Commonly known as: Lidoderm Place 1 patch onto the skin as needed. Remove & Discard patch within 12 hours or as directed by MD   losartan 50 MG tablet Commonly known as: COZAAR Take 1 tablet (50 mg total) by mouth daily.   methocarbamol  750 MG tablet Commonly known as: Robaxin-750 Take 1 tablet (750 mg total) by mouth 4 (four) times daily.   oxyCODONE 5 MG immediate release tablet Commonly known as: Roxicodone Take 1 tablet (5 mg total) by mouth every 4 (four) hours as needed for severe pain.   traZODone 100 MG tablet Commonly known as: DESYREL TAKE 1 TABLET(100 MG) BY MOUTH AT BEDTIME What changed: See the new instructions.          Follow-up Information     Orey Moure, Hedda Slade, PA-C Follow up.   Specialty: General Surgery Why: Call to confirm your appointment date and time, bring a copy of your photo ID and insurance card, arrive 30 minutes prior to your appointment Contact information: [address] [place] [address] [place] [address] [phone]                 Signed: Leary Roca, [facility] Surgery 09/02/2022, 4:16 PM Please see Amion for pager number during day hours 7:00am-4:30pm

## 2022-09-02 NOTE — Consult Note (Addendum)
 Referring Provider: Meghan Clark PA-C Primary Care Physician:  Amin, Saad, MD Primary Gastroenterologist:  Dr. Beavers, [facility]   Reason for Consultation:  Hematemesis   HPI: Melissa Walker is a 42 y.o. female history of anxiety, hypertension, chronic back pain with opioid dependence, pelvic cystic mass (followed by gyn) and GERD. She presented to the ED 08/27/2022 with nausea, vomiting and RUQ pain. WBC 11.  Normal LFTs and lipase level. CTAP showed gallstones without acute cholecystitis. RUQ sonogram showed a normal liver and a large gallstone without sign cholecystitis. She subsequently underwent a laparoscopic cholecystectomy by Dr. Lovick on 08/28/2022 then discharged home the same day. She felt well post operatively until she developed mild nausea followed by vomiting a large amount of bright red blood on 09/01/2022. No abdominal pain, melena or hematochezia. She presented to the ED for further evaluation. Labs in the ED showed a Hemoglobin of 9.4 down from 12.5 on 6/27. WBC 11.7.  Alk phos 129. Total bili 0.5.  AST 36.  ALT 68 up from 13 on 6/27. CTAP consistent with s/p cholecystectomy with mild fluid in the gallbladder fossa without biloma or evidence of a bile leak.  On PPI infusion.  A GI consult was requested for further evaluation for hematemesis with associated anemia.  She denies having any heartburn, dysphagia or abdominal pain.  She took Ibuprofen 600mg one tab po qid x 3 days s/p cholecystectomy as prescribed.  She endorsed vomiting a large amount of bright red blood on 7/2 while at home and x 2 episodes when in the ED yesterday.  No further nausea or hematemesis.  She passed a normal brown formed bowel movement earlier this morning.  Rare alcohol intake.  No drug use.  Past smoker, currently vapes.  She has a history of upper GI bleed with hematemesis and melena 08/2018 in setting of NSAID use.  She underwent an EGD by Dr. Beavers 08/02/2018 which showed grade a reflux  esophagitis.  She was prescribed Omeprazole 20 mg daily and Famotidine 20 mg twice daily and her symptoms resolved.  GI PROCEDURES:  EGD 08/02/2018 due hematemesis/melena: - LA Grade A reflux esophagitis. - Normal stomach. Biopsied.  - Normal examined duodenum.  - ANTRAL AND OXYNTIC MUCOSA WITH SLIGHT CHRONIC INFLAMMATION AND HYPEREMIA. - WARTHIN-STARRY STAIN NEGATIVE FOR HELICOBACTER PYLORI. - NO INTESTINAL METAPLASIA, DYSPLASIA, OR MALIGNANCY IDENTIFIED.  Past Medical History:  Diagnosis Date   Abnormal uterine bleeding 02/04/2012   Occurred 01/12/12-01/15/12. Evaluated in [facility] and all labs, exam, and pelvic u/s WNL. Given rx for ibuprofen and told to follow up as otupatient.     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 Overview: Pain contract; hydrocodone-acetaminophen 5-325 q6hr PRN Formatting of this note might be different from the original. Formatting of this   note might be different from the original.  Overview:  Pain contract; hydrocodone-acetaminophen 5-325 q6hr PRN   Pelvic mass 07/13/2018   6.1 x 2.3 cm posterior pelvic cystic mass noted on CT 05/2018.   PUD (peptic ulcer disease) 07/13/2018   Sacroiliac joint pain 11/14/2011   Sacroiliitis, not elsewhere classified (HCC)  11/14/2011   Tobacco abuse    Underweight 10/26/2014    Past Surgical History:  Procedure Laterality Date   CHOLECYSTECTOMY N/A 08/28/2022   Procedure: LAPAROSCOPIC CHOLECYSTECTOMY;  Surgeon: Lovick, Ayesha N, MD;  Location: [facility];  Service: General;  Laterality: N/A;   DENTAL REHABILITATION     full set dentures   LUMBAR LAMINECTOMY/DECOMPRESSION MICRODISCECTOMY Right 07/23/2014   Procedure: Right Lumbar five-Sacral one laminotomy and microdiskectomy;  Surgeon: Robert Nudelman, MD;  Location: [facility];  Service: Neurosurgery;  Laterality: Right;  Right Lumbar five-Sacral one laminotomy and microdiskectomy   Multiple epidural steroid injections in back     Over past 2 years   POSTERIOR LAMINECTOMY / DECOMPRESSION LUMBAR SPINE  06/23/2018   L5-S1 decompression    TUBAL LIGATION Bilateral 05/11/2013   Procedure: POST PARTUM TUBAL LIGATION;  Surgeon: Ugonna A Anyanwu, MD;  Location: [facility];  Service: Gynecology;  Laterality: Bilateral;    Prior to Admission medications   Medication Sig Start Date End Date Taking? Authorizing Provider  acetaminophen (TYLENOL) 500 MG tablet Take 2 tablets (1,000 mg total) by mouth 4 (four) times daily for 365 doses. 08/28/22 11/28/22 Yes Lovick, Ayesha N, MD  atenolol (TENORMIN) 25 MG tablet Take 1 tablet (25 mg total) by mouth daily. 03/31/22  Yes Amin, Saad, MD  docusate sodium (COLACE) 100 MG capsule Take 1 capsule (100 mg total) by mouth 2 (two) times daily. 08/28/22 08/28/23 Yes Lovick, Ayesha N, MD  DULoxetine (CYMBALTA) 60 MG capsule TAKE 1 CAPSULE(60 MG) BY MOUTH DAILY 03/18/21  Yes Basaraba, Iulia, MD  gabapentin (NEURONTIN) 300 MG capsule Take 300 mg by mouth 2 (two) times daily.   Yes [provider]  HYDROcodone-acetaminophen (NORCO) 7.5-325 MG tablet Take 1 tablet by mouth every 8 (eight) hours as needed for moderate pain.   Yes [provider]  ibuprofen (ADVIL) 600 MG tablet Take 1 tablet (600 mg total) by mouth 4 (four) times  daily. 08/28/22  Yes Lovick, Ayesha N, MD  losartan (COZAAR) 50 MG tablet Take 1 tablet (50 mg total) by mouth daily. 03/31/22  Yes Amin, Saad, MD  methocarbamol (ROBAXIN-750) 750 MG tablet Take 1 tablet (750 mg total) by mouth 4 (four) times daily. 08/28/22  Yes Lovick, Ayesha N, MD  oxyCODONE (ROXICODONE) 5 MG immediate release tablet Take 1 tablet (5 mg total) by mouth every 4 (four) hours as needed for severe pain. 08/28/22 08/28/23 Yes Lovick, Ayesha N, MD  traZODone (DESYREL) 100 MG tablet TAKE 1 TABLET(100 MG) BY MOUTH AT BEDTIME Patient taking differently: Take 100 mg by mouth at bedtime. 07/15/22  Yes Amin, Saad, MD  diclofenac (VOLTAREN) 75 MG EC tablet Take 1 tablet (75 mg total) by mouth 2 (two) times daily. 06/05/22   Amin, Saad, MD  lidocaine (LIDODERM) 5 % Place 1 patch onto the skin as needed. Remove & Discard patch within 12 hours or as directed by MD 04/09/22   Amin, Saad, MD  ondansetron (ZOFRAN-ODT) 4 MG disintegrating tablet Take 4 mg by mouth every 6 (six) hours. For Nausea 08/31/22 09/07/22  [provider]    Current Facility-Administered Medications  Medication Dose Route Frequency Provider Last   Rate Last Admin   0.9 %  sodium chloride infusion   Intravenous Continuous Wolfe, Allison, MD 100 mL/hr at 09/02/22 0607 Infusion Verify at 09/02/22 0607   Chlorhexidine Gluconate Cloth 2 % PADS 6 each  6 each Topical Daily Wolfe, Allison, MD       metoprolol tartrate (LOPRESSOR) injection 2.5 mg  2.5 mg Intravenous Q8H PRN Wolfe, Allison, MD       morphine (PF) 2 MG/ML injection 2 mg  2 mg Intravenous Q3H PRN Wolfe, Allison, MD       ondansetron (ZOFRAN) tablet 4 mg  4 mg Oral Q6H PRN Wolfe, Allison, MD       Or   ondansetron (ZOFRAN) injection 4 mg  4 mg Intravenous Q6H PRN Wolfe, Allison, MD       Oral care mouth rinse  15 mL Mouth Rinse PRN Wolfe, Allison, MD       pantoprozole (PROTONIX) 80 mg /NS 100 mL infusion  8 mg/hr Intravenous Continuous Clark, Meghan R, PA-C 10 mL/hr at  09/02/22 0607 8 mg/hr at 09/02/22 0607   [START ON 09/03/2022] pneumococcal 20-valent conjugate vaccine (PREVNAR 20) injection 0.5 mL  0.5 mL Intramuscular Tomorrow-1000 Wolfe, Allison, MD        Allergies as of 09/01/2022 - Review Complete 09/01/2022  Allergen Reaction Noted   Mobic [meloxicam] Other (See Comments) 06/20/2018   Tape Rash 01/19/2015   Wound dressing adhesive Rash 01/19/2015   Penicillins Other (See Comments) 07/08/2015   Nicotine Rash     Family History  Problem Relation Age of Onset   Cancer Father        brain tumor    Eclampsia Maternal Grandmother    Heart attack Maternal Grandmother    Cancer Maternal Grandmother        lung   Heart disease Maternal Grandmother    Hyperlipidemia Maternal Grandmother    Hypertension Maternal Grandmother     Social History   Socioeconomic History   Marital status: Significant Other    Spouse name: Not on file   Number of children: Not on file   Years of education: Not on file   Highest education level: Not on file  Occupational History   Not on file  Tobacco Use   Smoking status: Every Day    Years: 18    Types: E-cigarettes, Cigarettes    Last attempt to quit: 01/02/2022    Years since quitting: 0.6   Smokeless tobacco: Never  Vaping Use   Vaping Use: Every day   Start date: 01/02/2022   Substances: Nicotine, Flavoring, Nicotine-salt   Devices: E-Juice  Substance and Sexual Activity   Alcohol use: Yes    Alcohol/week: 0.0 standard drinks of alcohol    Comment: occasional   Drug use: No   Sexual activity: Yes    Birth control/protection: Condom, Surgical  Other Topics Concern   Not on file  Social History Narrative   Not on file   Social Determinants of Health   Financial Resource Strain: Not on file  Food Insecurity: No Food Insecurity (09/02/2022)   Hunger Vital Sign    Worried About Running Out of Food in the Last Year: Never true    Ran Out of Food in the Last Year: Never true  Transportation Needs:  No Transportation Needs (09/02/2022)   PRAPARE - Transportation    Lack of Transportation (Medical): No    Lack of Transportation (Non-Medical): No  Physical Activity: Not on file  Stress: Not on file    Social Connections: Unknown (08/22/2018)   Social Connection and Isolation Panel [NHANES]    Frequency of Communication with Friends and Family: More than three times a week    Frequency of Social Gatherings with Friends and Family: Not on file    Attends Religious Services: Not on file    Active Member of Clubs or Organizations: Not on file    Attends Club or Organization Meetings: Not on file    Marital Status: Not on file  Intimate Partner Violence: Not At Risk (09/02/2022)   Humiliation, Afraid, Rape, and Kick questionnaire    Fear of Current or Ex-Partner: No    Emotionally Abused: No    Physically Abused: No    Sexually Abused: No   Review of Systems: Gen: Denies fever, sweats or chills. No weight loss.  CV: Denies chest pain, palpitations or edema. Resp: Denies cough, shortness of breath of hemoptysis.  GI: See HPI. GU : Denies urinary burning, blood in urine, increased urinary frequency or incontinence. MS: Denies joint pain, muscles aches or weakness. Derm: Denies rash, itchiness, skin lesions or unhealing ulcers. Psych: + Anxiety. Heme: Denies easy bruising, bleeding. Neuro:  Denies headaches, dizziness or paresthesias. Endo:  Denies any problems with DM, thyroid or adrenal function.  Physical Exam: Vital signs in last 24 hours: Temp:  [98.2 F (36.8 C)-99.4 F (37.4 C)] 98.7 F (37.1 C) (07/03 0500) Pulse Rate:  [91-130] 102 (07/03 0600) Resp:  [15-28] 17 (07/03 0600) BP: (123-144)/(54-99) 123/86 (07/03 0600) SpO2:  [97 %-100 %] 100 % (07/03 0600) Weight:  [59 kg] 59 kg (07/02 1721) Last BM Date : 08/27/22 General:  Alert 42-year-old female in no acute distress. Head:  Normocephalic and atraumatic. Eyes:  No scleral icterus. Conjunctiva pink. Ears:  Normal  auditory acuity. Nose:  No deformity, discharge or lesions. Mouth: Absent dentition.  No ulcers or lesions.  Neck:  Supple. No lymphadenopathy or thyromegaly.  Lungs: Breath sounds clear throughout. No wheezes, rhonchi or crackles.  Heart: Tachycardic, no murmurs. Abdomen: Soft, very mild distention.  Nontender.  Laparoscopic incisions with ecchymosis without erythema or drainage.  Positive bowel sounds all 4 quadrants. Rectal: Deferred. Musculoskeletal:  Symmetrical without gross deformities.  Pulses:  Normal pulses noted. Extremities:  Without clubbing or edema. Neurologic:  Alert and  oriented x 4. No focal deficits.  Skin:  Intact without significant lesions or rashes. Psych:  Alert and cooperative. Normal mood and affect.  Intake/Output from previous day: 07/02 0701 - 07/03 0700 In: 404.4 [I.V.:385.5; IV Piggyback:18.9] Out: 250 [Urine:250] Intake/Output this shift: No intake/output data recorded.  Lab Results: Recent Labs    09/01/22 1841 09/01/22 1852 09/01/22 2235 09/02/22 0320  WBC 11.7*  --  11.1* 11.6*  HGB 9.4* 9.9* 7.6* 8.1*  HCT 28.3* 29.0* 23.0* 24.8*  PLT 440*  --  424* 425*   BMET Recent Labs    09/01/22 1841 09/01/22 1852 09/02/22 0320  NA 137 138 138  K 3.4* 3.6 3.5  CL 103 101 102  CO2 27  --  24  GLUCOSE 116* 111* 102*  BUN 21* 20 22*  CREATININE 0.76 0.70 0.64  CALCIUM 8.2*  --  8.1*   LFT Recent Labs    09/02/22 0320  PROT 5.6*  ALBUMIN 2.9*  AST 20  ALT 56*  ALKPHOS 115  BILITOT 0.6   PT/INR No results for input(s): "LABPROT", "INR" in the last 72 hours. Hepatitis Panel No results for input(s): "HEPBSAG", "HCVAB", "HEPAIGM", "HEPBIGM" in the last 72   hours.    Studies/Results: CT ABDOMEN PELVIS W CONTRAST  Result Date: 09/01/2022 CLINICAL DATA:  Status post cholecystectomy 4 days ago with right-sided abdominal pain, initial encounter EXAM: CT ABDOMEN AND PELVIS WITH CONTRAST TECHNIQUE: Multidetector CT imaging of the abdomen  and pelvis was performed using the standard protocol following bolus administration of intravenous contrast. RADIATION DOSE REDUCTION: This exam was performed according to the departmental dose-optimization program which includes automated exposure control, adjustment of the mA and/or kV according to patient size and/or use of iterative reconstruction technique. CONTRAST:  100mL OMNIPAQUE IOHEXOL 300 MG/ML  SOLN COMPARISON:  None Available. FINDINGS: Lower chest: No acute abnormality. Hepatobiliary: Gallbladder has been surgically removed. Minimal fluid is noted in the gallbladder fossa consistent with the recent surgery. No focal abnormality to suggest bile leak or biloma is noted. Liver is fatty infiltrated. Pancreas: Unremarkable. No pancreatic ductal dilatation or surrounding inflammatory changes. Spleen: Normal in size without focal abnormality. Adrenals/Urinary Tract: Adrenal glands are within normal limits. Kidneys are well visualized bilaterally. No renal calculi or obstructive changes are noted. Bladder is partially distended. Stomach/Bowel: The appendix is within normal limits. No obstructive or inflammatory changes of the colon are seen. Small bowel and stomach are within normal limits. Vascular/Lymphatic: No significant vascular findings are present. No enlarged abdominal or pelvic lymph nodes. Reproductive: Uterus and bilateral adnexa are unremarkable. 2.4 cm simple cyst is noted in the left ovary. This is stable from the prior exam. Left tubal ligation clip is displaced. This is stable from the prior exam. Other: No abdominal wall hernia or abnormality. No abdominopelvic ascites. Musculoskeletal: Postsurgical changes are noted in the lower lumbar spine. No hardware abnormality is seen. IMPRESSION: Status post cholecystectomy. Mild fluid is noted within the gallbladder fossa consistent with the recent surgery although no biloma or findings of bile leak are seen. Stable 2.4 cm cyst in the left ovary.  No  follow-up is recommended. Stable dislodged left tubal ligation clip. Electronically Signed   By: Mark  Lukens M.D.   On: 09/01/2022 19:29    IMPRESSION/PLAN:  42 year old female s/p laparoscopic cholecystectomy 08/28/2022 treated with Ibuprofen 600mg qid x 3 days post op and subsequently developed hematemesis x 3 and anemia. Admission Hg 9.4 (Hg 12.5 on 6/27) -> 9.9 -> 7.6 -> today Hg 8.1.  Iron 78.  Saturation ratio is 36.  B12 364.  Prior history of hematemesis secondary to esophagitis identified per EGD 08/2018 in setting of NSAID use.  No further nausea or hematemesis overnight.  No melena or rectal bleeding.  -NPO -EGD today with Dr. Danis, benefits and risks discussed including risk with sedation, risk of bleeding, perforation and infection  -Continue PPI infusion -H/H -Transfuse for hemoglobin less than 8 or as needed if symptomatic -Ondansetron 4 mg p.o. or IV every 6 hours -IV fluids and pain management per the hospitalist -No NSAIDs   Colleen M Kennedy-Smith  09/02/2022, 8:36 AM  I have taken an interval history, thoroughly reviewed the chart and examined the patient. I agree with the Advanced Practitioner's note, impression and recommendations, and have recorded additional findings, impressions and recommendations below. I performed a substantive portion of this encounter (>50% time spent), including a complete performance of the medical decision making.  My additional thoughts are as follows:  42-year-old woman with hematemesis, melena and severe acute blood loss anemia.  Upper GI bleed of suspected peptic ulcer disease cause and patient using NSAIDs (Aleve) regularly.  She had a marked decrease in hemoglobin overnight indicating   ongoing bleeding, but fortunately remains hemodynamically stable. Currently receiving her second of 2 units of planned PRBC transfusion today, and I evaluated her in the endoscopy preprocedure area along with our anesthesia provider and with endoscopy nurse  staff present.  She understands we will proceed with upper endoscopy for localization and endoscopic treatment of bleeding source.  Agreeable after discussion of procedure and risks.  The benefits and risks of the planned procedure were described in detail with the patient or (when appropriate) their health care proxy.  Risks were outlined as including, but not limited to, bleeding, infection, perforation, adverse medication reaction leading to cardiac or pulmonary decompensation, pancreatitis (if ERCP).  The limitation of incomplete mucosal visualization was also discussed.  No guarantees or warranties were given.  Continue Protonix drip, will need follow-up serial hemoglobin and hematocrit, further plans to follow endoscopy.   Naydene Kamrowski L Danis III Office:[phone]       

## 2022-09-02 NOTE — Progress Notes (Signed)

## 2022-09-02 NOTE — Progress Notes (Addendum)
PROGRESS NOTE    Melissa Walker  ZOX:[id number]  DOB: Apr 08, 1980  DOA: 09/01/2022 PCP: Eloisa Northern, MD Outpatient Specialists:   Hospital course:  42 year old with H/oh UGI bleed 2020 thought to be secondary to NSAID use, HTN, anxiety, postmenopausal breathing, chronic LBP with opioid dependence, pelvic cystic mass followed by GYN who underwent lap cholecystectomy on 6/28 was re-admitted with hematemesis.  Patient was hemodynamically stable, initial hemoglobin was stable at 8 however subsequent hemoglobin had dropped to 6.  Patient was seen by GI who is planning on EGD today.  Subjective:  Patient states she is very scared.  She was out of the woods after her surgery but now is afraid that this bleeding is can cause her to die.  Notes she would like to see her kids but understands that they are not allowed in right now.   Objective: Vitals:   09/02/22 1252 09/02/22 1300 09/02/22 1314 09/02/22 1359  BP:  137/66 (!) 151/78 (!) 98/47  Pulse: 100 98 96 88  Resp: 18 15 18  (!) 21  Temp:  98.5 F (36.9 C) 98.5 F (36.9 C) 98.2 F (36.8 C)  TempSrc:  Temporal Temporal Temporal  SpO2: 99%  100% 100%  Weight:   59 kg   Height:   5\' 3"  (1.6 m)     Intake/Output Summary (Last 24 hours) at 09/02/2022 1403 Last data filed at 09/02/2022 1244 Gross per 24 hour  Intake 1359.72 ml  Output 500 ml  Net 859.72 ml   Filed Weights   09/01/22 1721 09/02/22 1314  Weight: 59 kg 59 kg     Exam:  General: Somewhat teary frightened appearing female sitting up in bed in no acute respiratory distress Eyes: sclera anicteric, conjuctiva mild injection bilaterally CVS: S1-S2, regular  Respiratory:  decreased air entry bilaterally secondary to decreased inspiratory effort, rales at bases  GI: Normoactive bowel sounds, minimal distention, minimal tenderness to deep palpation, laparoscopic incisions noted without evidence of infection/erythema or drainage. LE: Warm and well-perfused Neuro: A/O x  3,  grossly nonfocal.  Psych: patient is tearful and anxious  Data Reviewed:  Basic Metabolic Panel: Recent Labs  Lab 08/27/22 0501 08/27/22 2130 09/01/22 1841 09/01/22 1852 09/01/22 2235 09/02/22 0320  NA 136 136 137 138  --  138  K 4.3 3.7 3.4* 3.6  --  3.5  CL 103 105 103 101  --  102  CO2 25 25 27   --   --  24  GLUCOSE 106* 109* 116* 111*  --  102*  BUN 14 8 21* 20  --  22*  CREATININE 0.81 0.80 0.76 0.70  --  0.64  CALCIUM 8.3* 8.8* 8.2*  --   --  8.1*  MG  --   --   --   --  1.4*  --     CBC: Recent Labs  Lab 08/27/22 2130 09/01/22 1841 09/01/22 1852 09/01/22 2235 09/02/22 0320 09/02/22 0930  WBC 11.0* 11.7*  --  11.1* 11.6* 8.8  NEUTROABS 8.5* 8.6*  --   --   --   --   HGB 12.5 9.4* 9.9* 7.6* 8.1* 6.1*  HCT 37.5 28.3* 29.0* 23.0* 24.8* 18.7*  MCV 98.2 100.7*  --  100.0 101.2* 101.1*  PLT 380 440*  --  424* 425* 340     Scheduled Meds:  [MAR Hold] Chlorhexidine Gluconate Cloth  6 each Topical Daily   [MAR Hold] pneumococcal 20-valent conjugate vaccine  0.5 mL Intramuscular Tomorrow-1000   Continuous  Infusions:  sodium chloride 100 mL/hr at 09/02/22 1236   pantoprazole 8 mg/hr (09/02/22 1236)     Assessment & Plan:   Upper GI Hematemesis Acute blood loss anemia Patient with history of PUD presents with hematemesis after laparoscopic cholecystectomy Patient is hemodynamically stable however hemoglobin has dropped from 10 postop down to 6 this morning Will transfuse 2 unit PRBC [facility] is following and she is scheduled for EGD today Continue to trend H&H overnight  S/p laparoscopic cholecystectomy POD #4 Continue conservative management Sessions appear to be healing well  HTN Hold atenolol until GI bleed has resolved  Peripheral neuropathy Chronic lower back pain Anxiety and depression Restart gabapentin and Cymbalta  Left ovarian cyst Being followed by GYN as an outpatient    DVT prophylaxis: SCD Code Status: Full Family  Communication: None today    Studies: CT ABDOMEN PELVIS W CONTRAST  Result Date: 09/01/2022 CLINICAL DATA:  Status post cholecystectomy 4 days ago with right-sided abdominal pain, initial encounter EXAM: CT ABDOMEN AND PELVIS WITH CONTRAST TECHNIQUE: Multidetector CT imaging of the abdomen and pelvis was performed using the standard protocol following bolus administration of intravenous contrast. RADIATION DOSE REDUCTION: This exam was performed according to the departmental dose-optimization program which includes automated exposure control, adjustment of the mA and/or kV according to patient size and/or use of iterative reconstruction technique. CONTRAST:  OMNIPAQUE IOHEXOL 300 MG/ML  SOLN COMPARISON:  None Available. FINDINGS: Lower chest: No acute abnormality. Hepatobiliary: Gallbladder has been surgically removed. Minimal fluid is noted in the gallbladder fossa consistent with the recent surgery. No focal abnormality to suggest bile leak or biloma is noted. Liver is fatty infiltrated. Pancreas: Unremarkable. No pancreatic ductal dilatation or surrounding inflammatory changes. Spleen: Normal in size without focal abnormality. Adrenals/Urinary Tract: Adrenal glands are within normal limits. Kidneys are well visualized bilaterally. No renal calculi or obstructive changes are noted. Bladder is partially distended. Stomach/Bowel: The appendix is within normal limits. No obstructive or inflammatory changes of the colon are seen. Small bowel and stomach are within normal limits. Vascular/Lymphatic: No significant vascular findings are present. No enlarged abdominal or pelvic lymph nodes. Reproductive: Uterus and bilateral adnexa are unremarkable. 2.4 cm simple cyst is noted in the left ovary. This is stable from the prior exam. Left tubal ligation clip is displaced. This is stable from the prior exam. Other: No abdominal wall hernia or abnormality. No abdominopelvic ascites. Musculoskeletal: Postsurgical  changes are noted in the lower lumbar spine. No hardware abnormality is seen. IMPRESSION: Status post cholecystectomy. Mild fluid is noted within the gallbladder fossa consistent with the recent surgery although no biloma or findings of bile leak are seen. Stable 2.4 cm cyst in the left ovary.  No follow-up is recommended. Stable dislodged left tubal ligation clip. Electronically Signed   By: Alcide Clever M.D.   On: 09/01/2022 19:29    Principal Problem:   Upper GI bleed Active Problems:   Acute on chronic blood loss anemia   Sinus tachycardia   Status post lap cholecystectomy   Peripheral neuropathy   Hypertension   Vitamin B 12 deficiency   Anxiety   History of ovarian cyst     Pieter Partridge, [facility]  If 7PM-7AM, please contact night-coverage [email or web address]   LOS: 0 days

## 2022-09-02 NOTE — H&P (View-Only) (Signed)
Referring Provider: Melton Alar PA-C Primary Care Physician:  Eloisa Northern, MD Primary Gastroenterologist:  Dr. [facility]   Reason for Consultation:  Hematemesis   HPI: Melissa Walker is a 42 y.o. female history of anxiety, hypertension, chronic back pain with opioid dependence, pelvic cystic mass (followed by gyn) and GERD. She presented to the ED 08/27/2022 with nausea, vomiting and RUQ pain. WBC 11.  Normal LFTs and lipase level. CTAP showed gallstones without acute cholecystitis. RUQ sonogram showed a normal liver and a large gallstone without sign cholecystitis. She subsequently underwent a laparoscopic cholecystectomy by Dr. Bedelia Person on 08/28/2022 then discharged home the same day. She felt well post operatively until she developed mild nausea followed by vomiting a large amount of bright red blood on 09/01/2022. No abdominal pain, melena or hematochezia. She presented to the ED for further evaluation. Labs in the ED showed a Hemoglobin of 9.4 down from 12.5 on 6/27. WBC 11.7.  Alk phos 129. Total bili 0.5.  AST 36.  ALT 68 up from 13 on 6/27. CTAP consistent with s/p cholecystectomy with mild fluid in the gallbladder fossa without biloma or evidence of a bile leak.  On PPI infusion.  A GI consult was requested for further evaluation for hematemesis with associated anemia.  She denies having any heartburn, dysphagia or abdominal pain.  She took Ibuprofen 600mg  one tab po qid x 3 days s/p cholecystectomy as prescribed.  She endorsed vomiting a large amount of bright red blood on 7/2 while at home and x 2 episodes when in the ED yesterday.  No further nausea or hematemesis.  She passed a normal brown formed bowel movement earlier this morning.  Rare alcohol intake.  No drug use.  Past smoker, currently vapes.  She has a history of upper GI bleed with hematemesis and melena 08/2018 in setting of NSAID use.  She underwent an EGD by Dr. Orvan Falconer 08/02/2018 which showed grade a reflux  esophagitis.  She was prescribed Omeprazole 20 mg daily and Famotidine 20 mg twice daily and her symptoms resolved.  GI PROCEDURES:  EGD 08/02/2018 due hematemesis/melena: - LA Grade A reflux esophagitis. - Normal stomach. Biopsied.  - Normal examined duodenum.  - ANTRAL AND OXYNTIC MUCOSA WITH SLIGHT CHRONIC INFLAMMATION AND HYPEREMIA. - WARTHIN-STARRY STAIN NEGATIVE FOR HELICOBACTER PYLORI. - NO INTESTINAL METAPLASIA, DYSPLASIA, OR MALIGNANCY IDENTIFIED.  Past Medical History:  Diagnosis Date   Abnormal uterine bleeding 02/04/2012   Occurred 01/12/12-01/15/12. Evaluated in [facility] and all labs, exam, and pelvic u/s WNL. Given rx for ibuprofen and told to follow up as otupatient.     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 Overview: Pain contract; hydrocodone-acetaminophen 5-325 q6hr PRN Formatting of this note might be different from the original. Formatting of this  note might be different from the original.  Overview:  Pain contract; hydrocodone-acetaminophen 5-325 q6hr PRN   Pelvic mass 07/13/2018   6.1 x 2.3 cm posterior pelvic cystic mass noted on CT 05/2018.   PUD (peptic ulcer disease) 07/13/2018   Sacroiliac joint pain 11/14/2011   Sacroiliitis, not elsewhere classified (HCC)  11/14/2011   Tobacco abuse    Underweight 10/26/2014    Past Surgical History:  Procedure Laterality Date   CHOLECYSTECTOMY N/A 08/28/2022   Procedure: LAPAROSCOPIC CHOLECYSTECTOMY;  Surgeon: Diamantina Monks, MD;  Location: [facility];  Service: General;  Laterality: N/A;   DENTAL REHABILITATION     full set dentures   LUMBAR LAMINECTOMY/DECOMPRESSION MICRODISCECTOMY Right 07/23/2014   Procedure: Right Lumbar five-Sacral one laminotomy and microdiskectomy;  Surgeon: Shirlean Kelly, MD;  Location: [facility];  Service: Neurosurgery;  Laterality: Right;  Right Lumbar five-Sacral one laminotomy and microdiskectomy   Multiple epidural steroid injections in back     Over past 2 years   POSTERIOR LAMINECTOMY / DECOMPRESSION LUMBAR SPINE  06/23/2018   L5-S1 decompression    TUBAL LIGATION Bilateral 05/11/2013   Procedure: POST PARTUM TUBAL LIGATION;  Surgeon: Tereso Newcomer, MD;  Location: [facility];  Service: Gynecology;  Laterality: Bilateral;    Prior to Admission medications   Medication Sig Start Date End Date Taking? Authorizing Provider  acetaminophen (TYLENOL) 500 MG tablet Take 2 tablets (1,000 mg total) by mouth 4 (four) times daily for 365 doses. 08/28/22 11/28/22 Yes Lovick, Lennie Odor, MD  atenolol (TENORMIN) 25 MG tablet Take 1 tablet (25 mg total) by mouth daily. 03/31/22  Yes Eloisa Northern, MD  docusate sodium (COLACE) 100 MG capsule Take 1 capsule (100 mg total) by mouth 2 (two) times daily. 08/28/22 08/28/23 Yes Lovick, Lennie Odor, MD  DULoxetine (CYMBALTA) 60 MG capsule TAKE 1 CAPSULE(60 MG) BY MOUTH DAILY 03/18/21  Yes Verdene Lennert, MD  gabapentin (NEURONTIN) 300 MG capsule Take 300 mg by mouth 2 (two) times daily.   Yes [provider]  HYDROcodone-acetaminophen (NORCO) 7.5-325 MG tablet Take 1 tablet by mouth every 8 (eight) hours as needed for moderate pain.   Yes [provider]  ibuprofen (ADVIL) 600 MG tablet Take 1 tablet (600 mg total) by mouth 4 (four) times  daily. 08/28/22  Yes Diamantina Monks, MD  losartan (COZAAR) 50 MG tablet Take 1 tablet (50 mg total) by mouth daily. 03/31/22  Yes Eloisa Northern, MD  methocarbamol (ROBAXIN-750) 750 MG tablet Take 1 tablet (750 mg total) by mouth 4 (four) times daily. 08/28/22  Yes Lovick, Lennie Odor, MD  oxyCODONE (ROXICODONE) 5 MG immediate release tablet Take 1 tablet (5 mg total) by mouth every 4 (four) hours as needed for severe pain. 08/28/22 08/28/23 Yes Lovick, Lennie Odor, MD  traZODone (DESYREL) 100 MG tablet TAKE 1 TABLET(100 MG) BY MOUTH AT BEDTIME Patient taking differently: Take 100 mg by mouth at bedtime. 07/15/22  Yes Eloisa Northern, MD  diclofenac (VOLTAREN) 75 MG EC tablet Take 1 tablet (75 mg total) by mouth 2 (two) times daily. 06/05/22   Eloisa Northern, MD  lidocaine (LIDODERM) 5 % Place 1 patch onto the skin as needed. Remove & Discard patch within 12 hours or as directed by MD 04/09/22   Eloisa Northern, MD  ondansetron (ZOFRAN-ODT) 4 MG disintegrating tablet Take 4 mg by mouth every 6 (six) hours. For Nausea 08/31/22 09/07/22  [provider]    Current Facility-Administered Medications  Medication Dose Route Frequency Provider Last  Rate Last Admin   0.9 %  sodium chloride infusion   Intravenous Continuous Orland Mustard, MD 100 mL/hr at 09/02/22 8119 Infusion Verify at 09/02/22 0607   Chlorhexidine Gluconate Cloth 2 % PADS 6 each  6 each Topical Daily Orland Mustard, MD       metoprolol tartrate (LOPRESSOR) injection 2.5 mg  2.5 mg Intravenous Q8H PRN Orland Mustard, MD       morphine (PF) 2 MG/ML injection 2 mg  2 mg Intravenous Q3H PRN Orland Mustard, MD       ondansetron [facility]) tablet 4 mg  4 mg Oral Q6H PRN Orland Mustard, MD       Or   ondansetron [facility]) injection 4 mg  4 mg Intravenous Q6H PRN Orland Mustard, MD       Oral care mouth rinse  15 mL Mouth Rinse PRN Orland Mustard, MD       pantoprozole (PROTONIX) 80 mg /NS 100 mL infusion  8 mg/hr Intravenous Continuous Clark, Meghan R, PA-C 10 mL/hr at  09/02/22 0607 8 mg/hr at 09/02/22 0607   [START ON 09/03/2022] pneumococcal 20-valent conjugate vaccine (PREVNAR 20) injection 0.5 mL  0.5 mL Intramuscular Tomorrow-1000 Orland Mustard, MD        Allergies as of 09/01/2022 - Review Complete 09/01/2022  Allergen Reaction Noted   Mobic [meloxicam] Other (See Comments) 06/20/2018   Tape Rash 01/19/2015   Wound dressing adhesive Rash 01/19/2015   Penicillins Other (See Comments) 07/08/2015   Nicotine Rash     Family History  Problem Relation Age of Onset   Cancer Father        brain tumor    Eclampsia Maternal Grandmother    Heart attack Maternal Grandmother    Cancer Maternal Grandmother        lung   Heart disease Maternal Grandmother    Hyperlipidemia Maternal Grandmother    Hypertension Maternal Grandmother     Social History   Socioeconomic History   Marital status: Significant Other    Spouse name: Not on file   Number of children: Not on file   Years of education: Not on file   Highest education level: Not on file  Occupational History   Not on file  Tobacco Use   Smoking status: Every Day    Years: 18    Types: E-cigarettes, Cigarettes    Last attempt to quit: 01/02/2022    Years since quitting: 0.6   Smokeless tobacco: Never  Vaping Use   Vaping Use: Every day   Start date: 01/02/2022   Substances: Nicotine, Flavoring, Nicotine-salt   Devices: E-Juice  Substance and Sexual Activity   Alcohol use: Yes    Alcohol/week: 0.0 standard drinks of alcohol    Comment: occasional   Drug use: No   Sexual activity: Yes    Birth control/protection: Condom, Surgical  Other Topics Concern   Not on file  Social History Narrative   Not on file   Social Determinants of Health   Financial Resource Strain: Not on file  Food Insecurity: No Food Insecurity (09/02/2022)   Hunger Vital Sign    Worried About Running Out of Food in the Last Year: Never true    Ran Out of Food in the Last Year: Never true  Transportation Needs:  No Transportation Needs (09/02/2022)   PRAPARE - Administrator, Civil Service (Medical): No    Lack of Transportation (Non-Medical): No  Physical Activity: Not on file  Stress: Not on file  Social Connections: Unknown (08/22/2018)   Social Connection and Isolation Panel [NHANES]    Frequency of Communication with Friends and Family: More than three times a week    Frequency of Social Gatherings with Friends and Family: Not on file    Attends Religious Services: Not on file    Active Member of Clubs or Organizations: Not on file    Attends Banker Meetings: Not on file    Marital Status: Not on file  Intimate Partner Violence: Not At Risk (09/02/2022)   Humiliation, Afraid, Rape, and Kick questionnaire    Fear of Current or Ex-Partner: No    Emotionally Abused: No    Physically Abused: No    Sexually Abused: No   Review of Systems: Gen: Denies fever, sweats or chills. No weight loss.  CV: Denies chest pain, palpitations or edema. Resp: Denies cough, shortness of breath of hemoptysis.  GI: See HPI. GU : Denies urinary burning, blood in urine, increased urinary frequency or incontinence. MS: Denies joint pain, muscles aches or weakness. Derm: Denies rash, itchiness, skin lesions or unhealing ulcers. Psych: + Anxiety. Heme: Denies easy bruising, bleeding. Neuro:  Denies headaches, dizziness or paresthesias. Endo:  Denies any problems with DM, thyroid or adrenal function.  Physical Exam: Vital signs in last 24 hours: Temp:  [98.2 F (36.8 C)-99.4 F (37.4 C)] 98.7 F (37.1 C) (07/03 0500) Pulse Rate:  [91-130] 102 (07/03 0600) Resp:  [15-28] 17 (07/03 0600) BP: (123-144)/(54-99) 123/86 (07/03 0600) SpO2:  [97 %-100 %] 100 % (07/03 0600) Weight:  [59 kg] 59 kg (07/02 1721) Last BM Date : 08/27/22 General:  Alert 42 year old female in no acute distress. Head:  Normocephalic and atraumatic. Eyes:  No scleral icterus. Conjunctiva pink. Ears:  Normal  auditory acuity. Nose:  No deformity, discharge or lesions. Mouth: Absent dentition.  No ulcers or lesions.  Neck:  Supple. No lymphadenopathy or thyromegaly.  Lungs: Breath sounds clear throughout. No wheezes, rhonchi or crackles.  Heart: Tachycardic, no murmurs. Abdomen: Soft, very mild distention.  Nontender.  Laparoscopic incisions with ecchymosis without erythema or drainage.  Positive bowel sounds all 4 quadrants. Rectal: Deferred. Musculoskeletal:  Symmetrical without gross deformities.  Pulses:  Normal pulses noted. Extremities:  Without clubbing or edema. Neurologic:  Alert and  oriented x 4. No focal deficits.  Skin:  Intact without significant lesions or rashes. Psych:  Alert and cooperative. Normal mood and affect.  Intake/Output from previous day: 07/02 0701 - 07/03 0700 In: 404.4 [I.V.:385.5; IV Piggyback:18.9] Out: 250 [Urine:250] Intake/Output this shift: No intake/output data recorded.  Lab Results: Recent Labs    09/01/22 1841 09/01/22 1852 09/01/22 2235 09/02/22 0320  WBC 11.7*  --  11.1* 11.6*  HGB 9.4* 9.9* 7.6* 8.1*  HCT 28.3* 29.0* 23.0* 24.8*  PLT 440*  --  424* 425*   BMET Recent Labs    09/01/22 1841 09/01/22 1852 09/02/22 0320  NA 137 138 138  K 3.4* 3.6 3.5  CL 103 101 102  CO2 27  --  24  GLUCOSE 116* 111* 102*  BUN 21* 20 22*  CREATININE 0.76 0.70 0.64  CALCIUM 8.2*  --  8.1*   LFT Recent Labs    09/02/22 0320  PROT 5.6*  ALBUMIN 2.9*  AST 20  ALT 56*  ALKPHOS 115  BILITOT 0.6   PT/INR No results for input(s): "LABPROT", "INR" in the last 72 hours. Hepatitis Panel No results for input(s): "HEPBSAG", "HCVAB", "HEPAIGM", "HEPBIGM" in the last 72  hours.    Studies/Results: CT ABDOMEN PELVIS W CONTRAST  Result Date: 09/01/2022 CLINICAL DATA:  Status post cholecystectomy 4 days ago with right-sided abdominal pain, initial encounter EXAM: CT ABDOMEN AND PELVIS WITH CONTRAST TECHNIQUE: Multidetector CT imaging of the abdomen  and pelvis was performed using the standard protocol following bolus administration of intravenous contrast. RADIATION DOSE REDUCTION: This exam was performed according to the departmental dose-optimization program which includes automated exposure control, adjustment of the mA and/or kV according to patient size and/or use of iterative reconstruction technique. CONTRAST:  OMNIPAQUE IOHEXOL 300 MG/ML  SOLN COMPARISON:  None Available. FINDINGS: Lower chest: No acute abnormality. Hepatobiliary: Gallbladder has been surgically removed. Minimal fluid is noted in the gallbladder fossa consistent with the recent surgery. No focal abnormality to suggest bile leak or biloma is noted. Liver is fatty infiltrated. Pancreas: Unremarkable. No pancreatic ductal dilatation or surrounding inflammatory changes. Spleen: Normal in size without focal abnormality. Adrenals/Urinary Tract: Adrenal glands are within normal limits. Kidneys are well visualized bilaterally. No renal calculi or obstructive changes are noted. Bladder is partially distended. Stomach/Bowel: The appendix is within normal limits. No obstructive or inflammatory changes of the colon are seen. Small bowel and stomach are within normal limits. Vascular/Lymphatic: No significant vascular findings are present. No enlarged abdominal or pelvic lymph nodes. Reproductive: Uterus and bilateral adnexa are unremarkable. 2.4 cm simple cyst is noted in the left ovary. This is stable from the prior exam. Left tubal ligation clip is displaced. This is stable from the prior exam. Other: No abdominal wall hernia or abnormality. No abdominopelvic ascites. Musculoskeletal: Postsurgical changes are noted in the lower lumbar spine. No hardware abnormality is seen. IMPRESSION: Status post cholecystectomy. Mild fluid is noted within the gallbladder fossa consistent with the recent surgery although no biloma or findings of bile leak are seen. Stable 2.4 cm cyst in the left ovary.  No  follow-up is recommended. Stable dislodged left tubal ligation clip. Electronically Signed   By: Alcide Clever M.D.   On: 09/01/2022 19:29    IMPRESSION/PLAN:  42 year old female s/p laparoscopic cholecystectomy 08/28/2022 treated with Ibuprofen 600mg  qid x 3 days post op and subsequently developed hematemesis x 3 and anemia. Admission Hg 9.4 (Hg 12.5 on 6/27) -> 9.9 -> 7.6 -> today Hg 8.1.  Iron 78.  Saturation ratio is 36.  B12 364.  Prior history of hematemesis secondary to esophagitis identified per EGD 08/2018 in setting of NSAID use.  No further nausea or hematemesis overnight.  No melena or rectal bleeding.  -NPO -EGD today with Dr. Myrtie Neither, benefits and risks discussed including risk with sedation, risk of bleeding, perforation and infection  -Continue PPI infusion -H/H -Transfuse for hemoglobin less than 8 or as needed if symptomatic -Ondansetron 4 mg p.o. or IV every 6 hours -IV fluids and pain management per the hospitalist -No NSAIDs   Arnaldo Natal  09/02/2022, 8:36 AM  I have taken an interval history, thoroughly reviewed the chart and examined the patient. I agree with the Advanced Practitioner's note, impression and recommendations, and have recorded additional findings, impressions and recommendations below. I performed a substantive portion of this encounter (>50% time spent), including a complete performance of the medical decision making.  My additional thoughts are as follows:  42 year old woman with hematemesis, melena and severe acute blood loss anemia.  Upper GI bleed of suspected peptic ulcer disease cause and patient using NSAIDs (Aleve) regularly.  She had a marked decrease in hemoglobin overnight indicating  ongoing bleeding, but fortunately remains hemodynamically stable. Currently receiving her second of 2 units of planned PRBC transfusion today, and I evaluated her in the endoscopy preprocedure area along with our anesthesia provider and with endoscopy nurse  staff present.  She understands we will proceed with upper endoscopy for localization and endoscopic treatment of bleeding source.  Agreeable after discussion of procedure and risks.  The benefits and risks of the planned procedure were described in detail with the patient or (when appropriate) their health care proxy.  Risks were outlined as including, but not limited to, bleeding, infection, perforation, adverse medication reaction leading to cardiac or pulmonary decompensation, pancreatitis (if ERCP).  The limitation of incomplete mucosal visualization was also discussed.  No guarantees or warranties were given.  Continue Protonix drip, will need follow-up serial hemoglobin and hematocrit, further plans to follow endoscopy.   Charlie Pitter III Office:[phone]

## 2022-09-02 NOTE — Anesthesia Preprocedure Evaluation (Addendum)
Anesthesia Evaluation  Patient identified by MRN, date of birth, ID band Patient awake    Reviewed: Allergy & Precautions, H&P , NPO status , Patient's Chart, lab work & pertinent test results  Airway Mallampati: II  TM Distance: >3 FB Neck ROM: Full    Dental no notable dental hx. (+) Edentulous Upper, Edentulous Lower, Dental Advisory Given   Pulmonary Current Smoker and Patient abstained from smoking.   Pulmonary exam normal breath sounds clear to auscultation       Cardiovascular hypertension, Pt. on medications and Pt. on home beta blockers  Rhythm:Regular Rate:Normal     Neuro/Psych   Anxiety     negative neurological ROS     GI/Hepatic Neg liver ROS, PUD,GERD  Medicated,,  Endo/Other  negative endocrine ROS    Renal/GU negative Renal ROS  negative genitourinary   Musculoskeletal  (+) Arthritis , Osteoarthritis,    Abdominal   Peds  Hematology  (+) Blood dyscrasia, anemia   Anesthesia Other Findings   Reproductive/Obstetrics negative OB ROS                             Anesthesia Physical Anesthesia Plan  ASA: 3  Anesthesia Plan: MAC   Post-op Pain Management: Minimal or no pain anticipated   Induction: Intravenous  PONV Risk Score and Plan: 1 and Propofol infusion  Airway Management Planned: Natural Airway and Simple Face Mask  Additional Equipment:   Intra-op Plan:   Post-operative Plan:   Informed Consent: I have reviewed the patients History and Physical, chart, labs and discussed the procedure including the risks, benefits and alternatives for the proposed anesthesia with the patient or authorized representative who has indicated his/her understanding and acceptance.     Dental advisory given  Plan Discussed with: CRNA  Anesthesia Plan Comments:        Anesthesia Quick Evaluation

## 2022-09-02 NOTE — ED Notes (Signed)
ED TO INPATIENT HANDOFF REPORT  ED Nurse Name and Phone #: Linus Orn Name/Age/Gender Melissa Walker 42 y.o. female Room/Bed: WA25/WA25  Code Status   Code Status: Full Code  Home/SNF/Other Home Patient oriented to: self, place, time, and situation Is this baseline? Yes   Triage Complete: Triage complete  Chief Complaint Upper GI bleed [K92.2]  Triage Note Cholecystectomy 4 days ago here, started vomiting blood today. Denies pain  Tachycardic in triage at 140-150.    Allergies Allergies  Allergen Reactions   Mobic [Meloxicam] Other (See Comments)    Stomach bleeding    Tape Rash    Nicotine patches   Wound Dressing Adhesive Rash    Nicotine patches   Penicillins Other (See Comments)    uncle highly allergic so was told whole family should not take it Did it involve swelling of the face/tongue/throat, SOB, or low BP? Unknown Did it involve sudden or severe rash/hives, skin peeling, or any reaction on the inside of your mouth or nose? Unknown Did you need to seek medical attention at a hospital or doctor's office? Unknown When did it last happen?      Never taken before If all above answers are "NO", may proceed with cephalosporin use.  Unknown, patient has never taken the medication but states her uncle has an allergy to it.    Nicotine Rash    Transdermal Patch    Level of Care/Admitting Diagnosis ED Disposition     ED Disposition  Admit   Condition  --   Comment  Hospital Area: [facility] [[id number]]  Level of Care: Stepdown [14]  Admit to SDU based on following criteria: Other see comments  Comments: upper GI bleed  May place patient in observation at [facility] or [facility] if equivalent level of care is available:: Yes  Covid Evaluation: Asymptomatic - no recent exposure (last 10 days) testing not required  Diagnosis: Upper GI bleed [657846]  Admitting Physician: Orland Mustard [[id number]]  Attending Physician: Alton Revere          B Medical/Surgery History Past Medical History:  Diagnosis Date   Abnormal uterine bleeding 02/04/2012   Occurred 01/12/12-01/15/12. Evaluated in MAU and all labs, exam, and pelvic u/s WNL. Given rx for ibuprofen and told to follow up as otupatient.     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 Overview: Pain contract; hydrocodone-acetaminophen 5-325 q6hr PRN Formatting of this note might be different from the original. Formatting of this note might be different from the original.  Overview:  Pain contract; hydrocodone-acetaminophen 5-325 q6hr PRN   Pelvic mass 07/13/2018   6.1 x 2.3 cm posterior pelvic cystic mass noted on CT 05/2018.   PUD (peptic ulcer disease) 07/13/2018   Sacroiliac  joint pain 11/14/2011   Sacroiliitis, not elsewhere classified (HCC) 11/14/2011   Tobacco abuse    Underweight 10/26/2014   Past Surgical History:  Procedure Laterality Date   CHOLECYSTECTOMY N/A 08/28/2022   Procedure: LAPAROSCOPIC CHOLECYSTECTOMY;  Surgeon: Diamantina Monks, MD;  Location: [facility];  Service: General;  Laterality: N/A;   DENTAL REHABILITATION     full set dentures   LUMBAR LAMINECTOMY/DECOMPRESSION MICRODISCECTOMY Right 07/23/2014   Procedure: Right Lumbar  five-Sacral one laminotomy and microdiskectomy;  Surgeon: Shirlean Kelly, MD;  Location: [facility];  Service: Neurosurgery;  Laterality: Right;  Right Lumbar five-Sacral one laminotomy and microdiskectomy   Multiple epidural steroid injections in back     Over past 2 years   POSTERIOR LAMINECTOMY / DECOMPRESSION LUMBAR SPINE  06/23/2018   L5-S1 decompression    TUBAL LIGATION Bilateral 05/11/2013   Procedure: POST PARTUM TUBAL LIGATION;  Surgeon: Tereso Newcomer, MD;  Location: [facility];  Service: Gynecology;  Laterality: Bilateral;     A IV Location/Drains/Wounds Patient Lines/Drains/Airways Status     Active Line/Drains/Airways     Name Placement date Placement time Site Days   Peripheral IV 09/01/22 20 G Left Antecubital 09/01/22  1719  Antecubital  1   Incision - 4 Ports Abdomen Umbilicus Mid;Upper Right;Lateral;Upper Right;Lateral;Lower 08/28/22  1414  -- 5            Intake/Output Last 24 hours No intake or output data in the 24 hours ending 09/02/22 0122  Labs/Imaging Results for orders placed or performed during the hospital encounter of 09/01/22 (from the past 48 hour(s))  Comprehensive metabolic panel     Status: Abnormal   Collection Time: 09/01/22  6:41 PM  Result Value Ref Range   Sodium 137 135 - 145 mmol/L   Potassium 3.4 (L) 3.5 - 5.1 mmol/L   Chloride 103 98 - 111 mmol/L   CO2 27 22 - 32 mmol/L   Glucose, Bld 116 (H) 70 - 99 mg/dL    Comment: Glucose reference range applies only to samples taken after fasting for at least 8 hours.   BUN 21 (H) 6 - 20 mg/dL   Creatinine, Ser 1.61 0.44 - 1.00 mg/dL   Calcium 8.2 (L) 8.9 - 10.3 mg/dL   Total Protein 6.0 (L) 6.5 - 8.1 g/dL   Albumin 2.9 (L) 3.5 - 5.0 g/dL   AST 36 15 - 41 U/L   ALT 68 (H) 0 - 44 U/L   Alkaline Phosphatase 129 (H) 38 - 126 U/L   Total Bilirubin 0.5 0.3 - 1.2 mg/dL   GFR, Estimated >09 >60 mL/min    Comment: (NOTE) Calculated using the CKD-EPI Creatinine Equation (2021)    Anion gap 7  5 - 15    Comment: Performed at [facility], [address] [place], [place] [address]  Type and screen [facility]     Status: None   Collection Time: 09/01/22  6:41 PM  Result Value Ref Range   ABO/RH(D) A NEG    Antibody Screen NEG    Sample Expiration      09/04/2022,2359 Performed at [facility], [address] [place], [place] [address]   CBC with Differential     Status: Abnormal   Collection Time: 09/01/22  6:41 PM  Result Value Ref Range   WBC 11.7 (H) 4.0 - 10.5 K/uL   RBC 2.81 (L) 3.87 - 5.11 MIL/uL   Hemoglobin 9.4 (L) 12.0 - 15.0 g/dL  HCT 28.3 (L) 36.0 - 46.0 %   MCV 100.7 (H) 80.0 - 100.0 fL   MCH 33.5 26.0 - 34.0 pg   MCHC 33.2 30.0 - 36.0 g/dL   RDW 16.1 09.6 - 04.5 %   Platelets 440 (H) 150 - 400 K/uL   nRBC 0.0 0.0 - 0.2 %   Neutrophils Relative % 73 %   Neutro Abs 8.6 (H) 1.7 - 7.7 K/uL   Lymphocytes Relative 20 %   Lymphs Abs 2.3 0.7 - 4.0 K/uL   Monocytes Relative 5 %   Monocytes Absolute 0.6 0.1 - 1.0 K/uL   Eosinophils Relative 2 %   Eosinophils Absolute 0.2 0.0 - 0.5 K/uL   Basophils Relative 0 %   Basophils Absolute 0.0 0.0 - 0.1 K/uL   Immature Granulocytes 0 %   Abs Immature Granulocytes 0.04 0.00 - 0.07 K/uL    Comment: Performed at [facility], [address], [place], [place] [address]  I-stat chem 8, ED (not at [facility], [facility] or [facility])     Status: Abnormal   Collection Time: 09/01/22  6:52 PM  Result Value Ref Range   Sodium 138 135 - 145 mmol/L   Potassium 3.6 3.5 - 5.1 mmol/L   Chloride 101 98 - 111 mmol/L   BUN 20 6 - 20 mg/dL   Creatinine, Ser 1.91 0.44 - 1.00 mg/dL   Glucose, Bld 478 (H) 70 - 99 mg/dL    Comment: Glucose reference range applies only to samples taken after fasting for at least 8 hours.   Calcium, Ion 1.16 1.15 - 1.40 mmol/L   TCO2 27 22 - 32 mmol/L   Hemoglobin 9.9 (L) 12.0 - 15.0 g/dL   HCT 29.5 (L) 62.1 - 30.8 %  POC occult blood, ED  Provider will collect     Status: Abnormal   Collection Time: 09/01/22  8:43 PM  Result Value Ref Range   Fecal Occult Bld POSITIVE (A) NEGATIVE  Magnesium     Status: Abnormal   Collection Time: 09/01/22 10:35 PM  Result Value Ref Range   Magnesium 1.4 (L) 1.7 - 2.4 mg/dL    Comment: Performed at [facility], [address] [place], [place] [address]  CBC     Status: Abnormal   Collection Time: 09/01/22 10:35 PM  Result Value Ref Range   WBC 11.1 (H) 4.0 - 10.5 K/uL   RBC 2.30 (L) 3.87 - 5.11 MIL/uL   Hemoglobin 7.6 (L) 12.0 - 15.0 g/dL   HCT 69.6 (L) 29.5 - 28.4 %   MCV 100.0 80.0 - 100.0 fL   MCH 33.0 26.0 - 34.0 pg   MCHC 33.0 30.0 - 36.0 g/dL   RDW 13.2 44.0 - 10.2 %   Platelets 424 (H) 150 - 400 K/uL   nRBC 0.0 0.0 - 0.2 %    Comment: Performed at [facility], [address], [place], [place] [address]  Ferritin     Status: None   Collection Time: 09/01/22 10:36 PM  Result Value Ref Range   Ferritin 34 11 - 307 ng/mL    Comment: Performed at [facility], [address], [place], [place] [address]  Iron and TIBC     Status: Abnormal   Collection Time: 09/01/22 10:36 PM  Result Value Ref Range   Iron 78 28 - 170 ug/dL   TIBC 474 (L) 259 - 563 ug/dL   Saturation Ratios 36 (H) 10.4 - 31.8 %  UIBC 136 ug/dL    Comment: Performed at [facility], [address], [place], [place] [address]  Vitamin B12     Status: None   Collection Time: 09/01/22 10:36 PM  Result Value Ref Range   Vitamin B-12 364 180 - 914 pg/mL    Comment: (NOTE) This assay is not validated for testing neonatal or myeloproliferative syndrome specimens for Vitamin B12 levels. Performed at [facility], [address], [place], [place] [address]   TSH     Status: Abnormal   Collection Time: 09/01/22 10:36 PM  Result Value Ref Range   TSH 0.268 (L) 0.350 - 4.500 uIU/mL    Comment: Performed by a 3rd  Generation assay with a functional sensitivity of <=0.01 uIU/mL. Performed at [facility], [address] [place], [place] [address]    CT ABDOMEN PELVIS W CONTRAST  Result Date: 09/01/2022 CLINICAL DATA:  Status post cholecystectomy 4 days ago with right-sided abdominal pain, initial encounter EXAM: CT ABDOMEN AND PELVIS WITH CONTRAST TECHNIQUE: Multidetector CT imaging of the abdomen and pelvis was performed using the standard protocol following bolus administration of intravenous contrast. RADIATION DOSE REDUCTION: This exam was performed according to the departmental dose-optimization program which includes automated exposure control, adjustment of the mA and/or kV according to patient size and/or use of iterative reconstruction technique. CONTRAST:  OMNIPAQUE IOHEXOL 300 MG/ML  SOLN COMPARISON:  None Available. FINDINGS: Lower chest: No acute abnormality. Hepatobiliary: Gallbladder has been surgically removed. Minimal fluid is noted in the gallbladder fossa consistent with the recent surgery. No focal abnormality to suggest bile leak or biloma is noted. Liver is fatty infiltrated. Pancreas: Unremarkable. No pancreatic ductal dilatation or surrounding inflammatory changes. Spleen: Normal in size without focal abnormality. Adrenals/Urinary Tract: Adrenal glands are within normal limits. Kidneys are well visualized bilaterally. No renal calculi or obstructive changes are noted. Bladder is partially distended. Stomach/Bowel: The appendix is within normal limits. No obstructive or inflammatory changes of the colon are seen. Small bowel and stomach are within normal limits. Vascular/Lymphatic: No significant vascular findings are present. No enlarged abdominal or pelvic lymph nodes. Reproductive: Uterus and bilateral adnexa are unremarkable. 2.4 cm simple cyst is noted in the left ovary. This is stable from the prior exam. Left tubal ligation clip is displaced. This is stable from the  prior exam. Other: No abdominal wall hernia or abnormality. No abdominopelvic ascites. Musculoskeletal: Postsurgical changes are noted in the lower lumbar spine. No hardware abnormality is seen. IMPRESSION: Status post cholecystectomy. Mild fluid is noted within the gallbladder fossa consistent with the recent surgery although no biloma or findings of bile leak are seen. Stable 2.4 cm cyst in the left ovary.  No follow-up is recommended. Stable dislodged left tubal ligation clip. Electronically Signed   By: Alcide Clever M.D.   On: 09/01/2022 19:29    Pending Labs Unresulted Labs (From admission, onward)     Start     Ordered   09/02/22 0500  Comprehensive metabolic panel  Tomorrow morning,   R        09/01/22 2157   09/01/22 2150  Rapid urine drug screen (hospital performed)  ONCE - STAT,   STAT        09/01/22 2149   09/01/22 2130  CBC  Now then every 6 hours,   R (with TIMED occurrences)      09/01/22 2129            Vitals/Pain Today's Vitals  09/01/22 2233 09/01/22 2300 09/02/22 0000 09/02/22 0039  BP:    132/73  Pulse: 97 97 95 (!) 107  Resp: (!) 23 (!) 28 (!) 21 (!) 24  Temp:      TempSrc:      SpO2: 98% 99% 100% 100%  Weight:      Height:      PainSc: 0-No pain       Isolation Precautions No active isolations  Medications Medications  pantoprozole (PROTONIX) 80 mg /NS 100 mL infusion (8 mg/hr Intravenous New Bag/Given 09/01/22 2101)  0.9 %  sodium chloride infusion (has no administration in time range)  morphine (PF) 2 MG/ML injection 2 mg (has no administration in time range)  ondansetron (ZOFRAN) tablet 4 mg (has no administration in time range)    Or  ondansetron (ZOFRAN) injection 4 mg (has no administration in time range)  metoprolol tartrate (LOPRESSOR) injection 2.5 mg (has no administration in time range)  lactated ringers bolus 1,000 mL (0 mLs Intravenous Stopped 09/01/22 2023)  ondansetron (ZOFRAN) injection 4 mg (4 mg Intravenous Given 09/01/22 1844)   iohexol (OMNIPAQUE) 300 MG/ML solution 100 mL (100 mLs Intravenous Contrast Given 09/01/22 1906)  pantoprazole (PROTONIX) injection 40 mg (40 mg Intravenous Given 09/01/22 1953)  ondansetron (ZOFRAN) injection 4 mg (4 mg Intravenous Given 09/01/22 2056)  fentaNYL (SUBLIMAZE) injection 50 mcg (50 mcg Intravenous Given 09/01/22 2124)  metoprolol tartrate (LOPRESSOR) injection 2.5 mg (2.5 mg Intravenous Given 09/01/22 2223)    Mobility walks     Focused Assessments Cardiac Assessment Handoff:    No results found for: "CKTOTAL", "CKMB", "CKMBINDEX", "TROPONINI" Lab Results  Component Value Date   DDIMER 0.38 04/01/2022   Does the Patient currently have chest pain? No    R Recommendations: See Admitting Provider Note  Report given to:   Additional Notes: Aaox4, ambulates.

## 2022-09-03 ENCOUNTER — Encounter (HOSPITAL_COMMUNITY): Payer: Self-pay | Admitting: Gastroenterology

## 2022-09-03 DIAGNOSIS — K920 Hematemesis: Secondary | ICD-10-CM | POA: Diagnosis not present

## 2022-09-03 DIAGNOSIS — D62 Acute posthemorrhagic anemia: Secondary | ICD-10-CM | POA: Diagnosis not present

## 2022-09-03 DIAGNOSIS — K264 Chronic or unspecified duodenal ulcer with hemorrhage: Secondary | ICD-10-CM | POA: Diagnosis not present

## 2022-09-03 DIAGNOSIS — K922 Gastrointestinal hemorrhage, unspecified: Secondary | ICD-10-CM | POA: Diagnosis not present

## 2022-09-03 LAB — TYPE AND SCREEN
ABO/RH(D): A NEG
Unit division: 0

## 2022-09-03 LAB — CBC
HCT: 28.2 % — ABNORMAL LOW (ref 36.0–46.0)
Hemoglobin: 9.6 g/dL — ABNORMAL LOW (ref 12.0–15.0)
MCH: 32.5 pg (ref 26.0–34.0)
MCHC: 34 g/dL (ref 30.0–36.0)
MCV: 95.6 fL (ref 80.0–100.0)
Platelets: 314 10*3/uL (ref 150–400)
RBC: 2.95 MIL/uL — ABNORMAL LOW (ref 3.87–5.11)
RDW: 14.5 % (ref 11.5–15.5)
WBC: 8.8 10*3/uL (ref 4.0–10.5)
nRBC: 0 % (ref 0.0–0.2)

## 2022-09-03 LAB — BPAM RBC
Blood Product Expiration Date: 202407192359
Blood Product Expiration Date: 202407192359
Unit Type and Rh: 600
Unit Type and Rh: 600

## 2022-09-03 LAB — BASIC METABOLIC PANEL WITH GFR
Anion gap: 9 (ref 5–15)
BUN: 10 mg/dL (ref 6–20)
CO2: 22 mmol/L (ref 22–32)
Calcium: 7.5 mg/dL — ABNORMAL LOW (ref 8.9–10.3)
Chloride: 105 mmol/L (ref 98–111)
Creatinine, Ser: 0.66 mg/dL (ref 0.44–1.00)
GFR, Estimated: >60 mL/min
Glucose, Bld: 79 mg/dL (ref 70–99)
Potassium: 3.2 mmol/L — ABNORMAL LOW (ref 3.5–5.1)
Sodium: 136 mmol/L (ref 135–145)

## 2022-09-03 LAB — HEMOGLOBIN AND HEMATOCRIT, BLOOD
HCT: 27.1 % — ABNORMAL LOW (ref 36.0–46.0)
HCT: 30.3 % — ABNORMAL LOW (ref 36.0–46.0)
Hemoglobin: 10.4 g/dL — ABNORMAL LOW (ref 12.0–15.0)
Hemoglobin: 9.3 g/dL — ABNORMAL LOW (ref 12.0–15.0)

## 2022-09-03 NOTE — Progress Notes (Signed)
[place] [facility] Progress Note  Chief Complaint: Bleeding duodenal ulcer with acute blood loss anemia  History:  No events overnight, nursing reports no hematemesis or melena.  Patient says she has not had a bowel movement since before her endoscopy yesterday.  She denies abdominal pain, chest pain, dyspnea, dysuria or hematuria.  Hungry and hoping to go home soon.  Objective:   Current Facility-Administered Medications:    0.9 %  sodium chloride infusion, , Intravenous, Continuous, Danis, Starr Lake III, MD, Last Rate: 100 mL/hr at 09/03/22 0336, New Bag at 09/03/22 0336   Chlorhexidine Gluconate Cloth 2 % PADS 6 each, 6 each, Topical, Daily, Charlie Pitter III, MD, 6 each at 09/02/22 0807   metoprolol tartrate (LOPRESSOR) injection 2.5 mg, 2.5 mg, Intravenous, Q8H PRN, Danis, Starr Lake III, MD   morphine (PF) 2 MG/ML injection 2 mg, 2 mg, Intravenous, Q3H PRN, Charlie Pitter III, MD, 2 mg at 09/02/22 2242   ondansetron (ZOFRAN) tablet 4 mg, 4 mg, Oral, Q6H PRN **OR** ondansetron (ZOFRAN) injection 4 mg, 4 mg, Intravenous, Q6H PRN, Danis, Starr Lake III, MD   Oral care mouth rinse, 15 mL, Mouth Rinse, PRN, Danis, Starr Lake III, MD   pantoprozole (PROTONIX) 80 mg /NS 100 mL infusion, 8 mg/hr, Intravenous, Continuous, Danis, Starr Lake III, MD, Last Rate: 10 mL/hr at 09/03/22 0443, 8 mg/hr at 09/03/22 0443   pneumococcal 20-valent conjugate vaccine (PREVNAR 20) injection 0.5 mL, 0.5 mL, Intramuscular, Tomorrow-1000, Danis, Vearl Aitken L III, MD   sodium chloride 100 mL/hr at 09/03/22 0336   pantoprazole 8 mg/hr (09/03/22 0443)     Vital signs in last 24 hrs: Vitals:   09/03/22 0327 09/03/22 0737  BP: 128/78 (!) 143/83  Pulse: 78 88  Resp: 19 18  Temp: 98.2 F (36.8 C) 98.2 F (36.8 C)  SpO2: 99% 99%    Intake/Output Summary (Last 24 hours) at 09/03/2022 0807 Last data filed at 09/03/2022 0000 Gross per 24 hour  Intake 2545.78 ml  Output 250 ml  Net 2295.78 ml     Physical Exam Fatigued, not  acutely ill-appearing. HEENT: sclera anicteric, oral mucosa without lesions Neck: supple, no thyromegaly, JVD or lymphadenopathy Cardiac: RRR without murmurs, S1S2 heard, no peripheral edema Pulm: clear to auscultation bilaterally, normal RR and effort noted Abdomen: soft, no tenderness, with active bowel sounds. No guarding or palpable hepatosplenomegaly Skin; warm and dry, no jaundice  Recent Labs:     Latest Ref Rng & Units 09/03/2022    6:03 AM 09/02/2022   11:50 PM 09/02/2022    6:04 PM  CBC  WBC 4.0 - 10.5 K/uL 8.8     Hemoglobin 12.0 - 15.0 g/dL 9.6  9.3  9.9   Hematocrit 36.0 - 46.0 % 28.2  27.1  29.2   Platelets 150 - 400 K/uL 314       No results for input(s): "INR" in the last 168 hours.    Latest Ref Rng & Units 09/03/2022    6:03 AM 09/02/2022    3:20 AM 09/01/2022    6:52 PM  CMP  Glucose 70 - 99 mg/dL 79  161  096   BUN 6 - 20 mg/dL 10  22  20    Creatinine 0.44 - 1.00 mg/dL 0.45  4.09  8.11   Sodium 135 - 145 mmol/L 136  138  138   Potassium 3.5 - 5.1 mmol/L 3.2  3.5  3.6   Chloride 98 - 111 mmol/L 105  102  101   CO2 22 - 32 mmol/L 22  24    Calcium 8.9 - 10.3 mg/dL 7.5  8.1    Total Protein 6.5 - 8.1 g/dL  5.6    Total Bilirubin 0.3 - 1.2 mg/dL  0.6    Alkaline Phos 38 - 126 U/L  115    AST 15 - 41 U/L  20    ALT 0 - 44 U/L  56       Radiologic studies:   Assessment & Plan  Assessment: 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  This ulcer is most likely caused by regular NSAID use, and she is known to be H. pylori negative from endoscopic biopsies several years ago.  Upper endoscopy yesterday applied hemostatic gel to the ulcer bed.  There was densely adherent clot with no clear visible vessel or other lesion upon which other interventions such as cauterization or clip could be performed.  Fortunately, she does not appear to have recurrent bleeding at this point.  Hemoglobin has remained  stable overnight.    Plan: She requires at least another day of hospital observation on Protonix drip. She was disappointed to hear this but understands the risk of rebleeding with the ulcer that was discovered.  I will change her blood count checks to every 12 hours and get another this evening and tomorrow morning.  However, if this patient passes melena, a CBC should be drawn sooner and the GI service alerted if there is a significant drop in it or any other concerns for recurrent bleeding.  Full liquid diet today.   Charlie Pitter III Office: [phone]

## 2022-09-03 NOTE — Progress Notes (Signed)
Called [phone] nurse Roj and gave report. Patient no change from previous assessment.

## 2022-09-03 NOTE — Progress Notes (Signed)
PROGRESS NOTE    Melissa Walker  EAV:[id number]  DOB: [birth year]  DOA: 09/01/2022 PCP: Eloisa Northern, MD Outpatient Specialists:   Hospital course:  42 year old with H/oh UGI bleed 2020 thought to be secondary to NSAID use, HTN, anxiety, postmenopausal breathing, chronic LBP with opioid dependence, pelvic cystic mass followed by GYN who underwent lap cholecystectomy on 6/28 was re-admitted with hematemesis.  Patient was hemodynamically stable, initial hemoglobin was stable at 8 however subsequent hemoglobin had dropped to 6.  Patient was seen by GI who is planning on EGD today.  Subjective:  Patient relieved that they were able to stop the bleeding at EGD yesterday.  Understands she cannot take NSAIDs and aspirin in the future.  Also notes that she will not drink alcohol.   Objective: Vitals:   09/03/22 0327 09/03/22 0737 09/03/22 1237 09/03/22 1637  BP: 128/78 (!) 143/83 134/79 (!) 143/72  Pulse: 78 88 96 89  Resp: 19 18 16 17   Temp: 98.2 F (36.8 C) 98.2 F (36.8 C) 98.4 F (36.9 C) 98.6 F (37 C)  TempSrc: Oral Oral Oral Oral  SpO2: 99% 99% 100% 100%  Weight:      Height:        Intake/Output Summary (Last 24 hours) at 09/03/2022 1818 Last data filed at 09/03/2022 1644 Gross per 24 hour  Intake 2405.84 ml  Output --  Net 2405.84 ml    Filed Weights   09/01/22 1721 09/02/22 1314  Weight: 59 kg 59 kg     Exam:  General: Patient in much better spirits sitting up in bed appears comfortable Eyes: sclera anicteric, conjuctiva mild injection bilaterally CVS: S1-S2, regular  Respiratory:  decreased air entry bilaterally secondary to decreased inspiratory effort, rales at bases  GI: Normoactive bowel sounds, minimal distention, minimal tenderness to deep palpation, laparoscopic incisions noted without evidence of infection/erythema or drainage. LE: Warm and well-perfused Neuro: A/O x 3,  grossly nonfocal.  Psych: patient is tearful and anxious  Data  Reviewed:  Basic Metabolic Panel: Recent Labs  Lab 08/27/22 2130 09/01/22 1841 09/01/22 1852 09/01/22 2235 09/02/22 0320 09/03/22 0603  NA 136 137 138  --  138 136  K 3.7 3.4* 3.6  --  3.5 3.2*  CL 105 103 101  --  102 105  CO2 25 27  --   --  24 22  GLUCOSE 109* 116* 111*  --  102* 79  BUN 8 21* 20  --  22* 10  CREATININE 0.80 0.76 0.70  --  0.64 0.66  CALCIUM 8.8* 8.2*  --   --  8.1* 7.5*  MG  --   --   --  1.4*  --   --      CBC: Recent Labs  Lab 08/27/22 2130 09/01/22 1841 09/01/22 1852 09/01/22 2235 09/02/22 0320 09/02/22 0930 09/02/22 1804 09/02/22 2350 09/03/22 0603  WBC 11.0* 11.7*  --  11.1* 11.6* 8.8  --   --  8.8  NEUTROABS 8.5* 8.6*  --   --   --   --   --   --   --   HGB 12.5 9.4*   < > 7.6* 8.1* 6.1* 9.9* 9.3* 9.6*  HCT 37.5 28.3*   < > 23.0* 24.8* 18.7* 29.2* 27.1* 28.2*  MCV 98.2 100.7*  --  100.0 101.2* 101.1*  --   --  95.6  PLT 380 440*  --  424* 425* 340  --   --  314   < > =  values in this interval not displayed.      Scheduled Meds:  Chlorhexidine Gluconate Cloth  6 each Topical Daily   Continuous Infusions:  sodium chloride 100 mL/hr at 09/03/22 1644   pantoprazole 8 mg/hr (09/03/22 1714)     Assessment & Plan:   Upper GI Hematemesis Acute blood loss anemia EGD yesterday with application of hemostatic gel to ulcer bed, no visible vessel however did have a densely adherent clot Since then H&H and vital signs have remained stable. Will continue Protonix drip for another 24 hours per GI recommendations Continue to follow H&H to ensure stability Appreciate ongoing GI consultation and recommendations  S/p laparoscopic cholecystectomy POD #5 Continue conservative management Sessions appear to be healing well  HTN BP is still under good control off atenolol, patient can likely restart atenolol at discharge  Peripheral neuropathy Chronic lower back pain Anxiety and depression Restart gabapentin and Cymbalta  Left ovarian  cyst Being followed by GYN as an outpatient    DVT prophylaxis: SCD Code Status: Full Family Communication: None today    Studies: CT ABDOMEN PELVIS W CONTRAST  Result Date: 09/01/2022 CLINICAL DATA:  Status post cholecystectomy 4 days ago with right-sided abdominal pain, initial encounter EXAM: CT ABDOMEN AND PELVIS WITH CONTRAST TECHNIQUE: Multidetector CT imaging of the abdomen and pelvis was performed using the standard protocol following bolus administration of intravenous contrast. RADIATION DOSE REDUCTION: This exam was performed according to the departmental dose-optimization program which includes automated exposure control, adjustment of the mA and/or kV according to patient size and/or use of iterative reconstruction technique. CONTRAST:  OMNIPAQUE IOHEXOL 300 MG/ML  SOLN COMPARISON:  None Available. FINDINGS: Lower chest: No acute abnormality. Hepatobiliary: Gallbladder has been surgically removed. Minimal fluid is noted in the gallbladder fossa consistent with the recent surgery. No focal abnormality to suggest bile leak or biloma is noted. Liver is fatty infiltrated. Pancreas: Unremarkable. No pancreatic ductal dilatation or surrounding inflammatory changes. Spleen: Normal in size without focal abnormality. Adrenals/Urinary Tract: Adrenal glands are within normal limits. Kidneys are well visualized bilaterally. No renal calculi or obstructive changes are noted. Bladder is partially distended. Stomach/Bowel: The appendix is within normal limits. No obstructive or inflammatory changes of the colon are seen. Small bowel and stomach are within normal limits. Vascular/Lymphatic: No significant vascular findings are present. No enlarged abdominal or pelvic lymph nodes. Reproductive: Uterus and bilateral adnexa are unremarkable. 2.4 cm simple cyst is noted in the left ovary. This is stable from the prior exam. Left tubal ligation clip is displaced. This is stable from the prior exam. Other:  No abdominal wall hernia or abnormality. No abdominopelvic ascites. Musculoskeletal: Postsurgical changes are noted in the lower lumbar spine. No hardware abnormality is seen. IMPRESSION: Status post cholecystectomy. Mild fluid is noted within the gallbladder fossa consistent with the recent surgery although no biloma or findings of bile leak are seen. Stable 2.4 cm cyst in the left ovary.  No follow-up is recommended. Stable dislodged left tubal ligation clip. Electronically Signed   By: Alcide Clever M.D.   On: 09/01/2022 19:29    Principal Problem:   Upper GI bleed Active Problems:   Acute on chronic blood loss anemia   Sinus tachycardia   Status post lap cholecystectomy   Peripheral neuropathy   Hypertension   Vitamin B 12 deficiency   Anxiety   History of ovarian cyst   Melena   Chronic duodenal ulcer with bleeding   ABLA (acute blood loss anemia)  Dewaine Oats Derek Jack, [facility]  If 7PM-7AM, please contact night-coverage [email or web address]   LOS: 1 day

## 2022-09-03 NOTE — Plan of Care (Signed)

## 2022-09-04 ENCOUNTER — Telehealth: Payer: Self-pay | Admitting: Nurse Practitioner

## 2022-09-04 DIAGNOSIS — I10 Essential (primary) hypertension: Secondary | ICD-10-CM

## 2022-09-04 DIAGNOSIS — K269 Duodenal ulcer, unspecified as acute or chronic, without hemorrhage or perforation: Secondary | ICD-10-CM

## 2022-09-04 DIAGNOSIS — D62 Acute posthemorrhagic anemia: Secondary | ICD-10-CM | POA: Diagnosis not present

## 2022-09-04 DIAGNOSIS — K921 Melena: Secondary | ICD-10-CM | POA: Diagnosis not present

## 2022-09-04 DIAGNOSIS — K922 Gastrointestinal hemorrhage, unspecified: Secondary | ICD-10-CM | POA: Diagnosis not present

## 2022-09-04 LAB — CBC
HCT: 29.8 % — ABNORMAL LOW (ref 36.0–46.0)
Hemoglobin: 10.2 g/dL — ABNORMAL LOW (ref 12.0–15.0)
MCH: 33 pg (ref 26.0–34.0)
MCHC: 34.2 g/dL (ref 30.0–36.0)
MCV: 96.4 fL (ref 80.0–100.0)
Platelets: 358 10*3/uL (ref 150–400)
RBC: 3.09 MIL/uL — ABNORMAL LOW (ref 3.87–5.11)
RDW: 14.2 % (ref 11.5–15.5)
WBC: 10.3 10*3/uL (ref 4.0–10.5)
nRBC: 0 % (ref 0.0–0.2)

## 2022-09-04 MED ORDER — PANTOPRAZOLE SODIUM 40 MG PO TBEC: 40.0000 mg | DELAYED_RELEASE_TABLET | Freq: Two times a day (BID) | ORAL | 0 refills | Status: DC

## 2022-09-04 NOTE — Discharge Summary (Signed)
Physician Discharge Summary   Patient: Melissa Walker MRN: [id number] DOB: [birth year]  Admit date:     09/01/2022  Discharge date: 09/04/2022  Discharge Physician: Pieter Partridge   PCP: Eloisa Northern, MD   Recommendations at discharge:   Stop using NSAIDs Take your pantoprazole as discussed Follow-up with gastroenterology per their recommendation  Discharge Diagnoses: Principal Problem:   Upper GI bleed Active Problems:   Acute on chronic blood loss anemia   Sinus tachycardia   Status post lap cholecystectomy   Peripheral neuropathy   Hypertension   Vitamin B 12 deficiency   Anxiety   History of ovarian cyst   Melena   Chronic duodenal ulcer with bleeding   ABLA (acute blood loss anemia)  Resolved Problems:   TOBACCO ABUSE  Hospital Course: No notes on file  Assessment and Plan:   Upper GI Hematemesis Acute blood loss anemia EGD yesterday with application of hemostatic gel to ulcer bed, no visible vessel however did have a densely adherent clot Since then H&H and vital signs have remained stable. Will continue Protonix drip for another 24 hours per GI recommendations Continue to follow H&H to ensure stability Appreciate ongoing GI consultation and recommendations   S/p laparoscopic cholecystectomy POD #5 Continue conservative management Sessions appear to be healing well   HTN BP is still under good control off atenolol, patient can likely restart atenolol at discharge   Peripheral neuropathy Chronic lower back pain Anxiety and depression Restart gabapentin and Cymbalta   Left ovarian cyst Being followed by GYN as an outpatient          Consultants: Gastroenterology Procedures performed: EGD Disposition: Home Diet recommendation:  Discharge Diet Orders (From admission, onward)     Start     Ordered   09/04/22 0000  Diet - low sodium heart healthy        09/04/22 1135           Regular diet DISCHARGE MEDICATION: Allergies  as of 09/04/2022       Reactions   Mobic [meloxicam] Other (See Comments)   Stomach bleeding    Tape Rash   Nicotine patches   Wound Dressing Adhesive Rash   Nicotine patches   Penicillins Other (See Comments)   uncle highly allergic so was told whole family should not take it Did it involve swelling of the face/tongue/throat, SOB, or low BP? Unknown Did it involve sudden or severe rash/hives, skin peeling, or any reaction on the inside of your mouth or nose? Unknown Did you need to seek medical attention at a hospital or doctor's office? Unknown When did it last happen?      Never taken before If all above answers are "NO", may proceed with cephalosporin use. Unknown, patient has never taken the medication but states her uncle has an allergy to it.    Nicotine Rash   Transdermal Patch        Medication List     STOP taking these medications    diclofenac 75 MG EC tablet Commonly known as: VOLTAREN   docusate sodium 100 MG capsule Commonly known as: Colace   HYDROcodone-acetaminophen 7.5-325 MG tablet Commonly known as: NORCO   ibuprofen 600 MG tablet Commonly known as: ADVIL       TAKE these medications    acetaminophen 500 MG tablet Commonly known as: TYLENOL Take 2 tablets (1,000 mg total) by mouth 4 (four) times daily for 365 doses.   atenolol 25 MG tablet Commonly known as: TENORMIN  Take 1 tablet (25 mg total) by mouth daily.   DULoxetine 60 MG capsule Commonly known as: CYMBALTA TAKE 1 CAPSULE(60 MG) BY MOUTH DAILY   gabapentin 300 MG capsule Commonly known as: NEURONTIN Take 300 mg by mouth 2 (two) times daily.   lidocaine 5 % Commonly known as: Lidoderm Place 1 patch onto the skin as needed. Remove & Discard patch within 12 hours or as directed by MD   losartan 50 MG tablet Commonly known as: COZAAR Take 1 tablet (50 mg total) by mouth daily.   methocarbamol 750 MG tablet Commonly known as: Robaxin-750 Take 1 tablet (750 mg total) by mouth  4 (four) times daily.   ondansetron 4 MG disintegrating tablet Commonly known as: ZOFRAN-ODT Take 4 mg by mouth every 6 (six) hours. For Nausea   oxyCODONE 5 MG immediate release tablet Commonly known as: Roxicodone Take 1 tablet (5 mg total) by mouth every 4 (four) hours as needed for severe pain.   pantoprazole 40 MG tablet Commonly known as: Protonix Take 1 tablet (40 mg total) by mouth 2 (two) times daily.   traZODone 100 MG tablet Commonly known as: DESYREL TAKE 1 TABLET(100 MG) BY MOUTH AT BEDTIME What changed: See the new instructions.        Discharge Exam: Filed Weights   09/01/22 1721 09/02/22 1314  Weight: 59 kg 59 kg   General: Patient in much better spirits sitting up in bed appears comfortable Eyes: sclera anicteric, conjuctiva mild injection bilaterally CVS: S1-S2, regular  Respiratory:  decreased air entry bilaterally secondary to decreased inspiratory effort, rales at bases  GI: Normoactive bowel sounds, minimal distention, minimal tenderness to deep palpation, laparoscopic incisions noted without evidence of infection/erythema or drainage. LE: Warm and well-perfused Neuro: A/O x 3,  grossly nonfocal.  Psych: patient is tearful and anxious   Condition at discharge: stable  The results of significant diagnostics from this hospitalization (including imaging, microbiology, ancillary and laboratory) are listed below for reference.   Imaging Studies: CT ABDOMEN PELVIS W CONTRAST  Result Date: 09/01/2022 CLINICAL DATA:  Status post cholecystectomy 4 days ago with right-sided abdominal pain, initial encounter EXAM: CT ABDOMEN AND PELVIS WITH CONTRAST TECHNIQUE: Multidetector CT imaging of the abdomen and pelvis was performed using the standard protocol following bolus administration of intravenous contrast. RADIATION DOSE REDUCTION: This exam was performed according to the departmental dose-optimization program which includes automated exposure control,  adjustment of the mA and/or kV according to patient size and/or use of iterative reconstruction technique. CONTRAST:  OMNIPAQUE IOHEXOL 300 MG/ML  SOLN COMPARISON:  None Available. FINDINGS: Lower chest: No acute abnormality. Hepatobiliary: Gallbladder has been surgically removed. Minimal fluid is noted in the gallbladder fossa consistent with the recent surgery. No focal abnormality to suggest bile leak or biloma is noted. Liver is fatty infiltrated. Pancreas: Unremarkable. No pancreatic ductal dilatation or surrounding inflammatory changes. Spleen: Normal in size without focal abnormality. Adrenals/Urinary Tract: Adrenal glands are within normal limits. Kidneys are well visualized bilaterally. No renal calculi or obstructive changes are noted. Bladder is partially distended. Stomach/Bowel: The appendix is within normal limits. No obstructive or inflammatory changes of the colon are seen. Small bowel and stomach are within normal limits. Vascular/Lymphatic: No significant vascular findings are present. No enlarged abdominal or pelvic lymph nodes. Reproductive: Uterus and bilateral adnexa are unremarkable. 2.4 cm simple cyst is noted in the left ovary. This is stable from the prior exam. Left tubal ligation clip is displaced. This is stable from the  prior exam. Other: No abdominal wall hernia or abnormality. No abdominopelvic ascites. Musculoskeletal: Postsurgical changes are noted in the lower lumbar spine. No hardware abnormality is seen. IMPRESSION: Status post cholecystectomy. Mild fluid is noted within the gallbladder fossa consistent with the recent surgery although no biloma or findings of bile leak are seen. Stable 2.4 cm cyst in the left ovary.  No follow-up is recommended. Stable dislodged left tubal ligation clip. Electronically Signed   By: Alcide Clever M.D.   On: 09/01/2022 19:29   US Abdomen Limited RUQ (LIVER/GB)  Result Date: 08/27/2022 CLINICAL DATA:  Right upper quadrant pain EXAM:  ULTRASOUND ABDOMEN LIMITED RIGHT UPPER QUADRANT COMPARISON:  08/27/2022 FINDINGS: Gallbladder: Large shadowing gallstone is identified measuring up to 2.3 cm. No gallbladder wall thickening or pericholecystic fluid. Negative sonographic Murphy sign. Common bile duct: Diameter: 4 mm Liver: No focal lesion identified. Within normal limits in parenchymal echogenicity. Portal vein is patent on color Doppler imaging with normal direction of blood flow towards the liver. Other: None. IMPRESSION: 1. Cholelithiasis without evidence of acute cholecystitis. Electronically Signed   By: Sharlet Salina M.D.   On: 08/27/2022 21:39   CT ABDOMEN PELVIS W CONTRAST  Result Date: 08/27/2022 CLINICAL DATA:  42 year old female with history of acute onset of nonlocalized abdominal pain. EXAM: CT ABDOMEN AND PELVIS WITH CONTRAST TECHNIQUE: Multidetector CT imaging of the abdomen and pelvis was performed using the standard protocol following bolus administration of intravenous contrast. RADIATION DOSE REDUCTION: This exam was performed according to the departmental dose-optimization program which includes automated exposure control, adjustment of the mA and/or kV according to patient size and/or use of iterative reconstruction technique. CONTRAST:  OMNIPAQUE IOHEXOL 300 MG/ML  SOLN COMPARISON:  CT of the abdomen and pelvis 06/30/2022. FINDINGS: Lower chest: Small hiatal hernia. Hepatobiliary: Tiny subcentimeter low-attenuation lesion in the inferior aspect of segment 5 of the liver (axial image 28 of series 3), too small to definitively characterize, but statistically likely a tiny cyst or biliary hamartoma. No other suspicious appearing hepatic lesions. No intra or extrahepatic biliary ductal dilatation. Common bile duct measures 4 mm in the porta hepatis. There is a noncalcified gallstone in the gallbladder measuring up to 2.7 cm in diameter. Gallbladder is moderately distended. Gallbladder wall thickness appears normal. No  pericholecystic fluid or surrounding inflammatory changes. Pancreas: No pancreatic mass. No pancreatic ductal dilatation. No pancreatic or peripancreatic fluid collections or inflammatory changes. Spleen: Unremarkable. Adrenals/Urinary Tract: Bilateral kidneys and bilateral adrenal glands are normal in appearance. No hydroureteronephrosis. Urinary bladder is normal in appearance. Stomach/Bowel: The appearance of the stomach is unremarkable. No pathologic dilatation of small bowel or colon. Normal appendix. Vascular/Lymphatic: No significant atherosclerotic disease, aneurysm or dissection noted in the abdominal or pelvic vasculature. No lymphadenopathy noted in the abdomen or pelvis. Reproductive: Right-sided tubal ligation clip. In additional ligation clip is noted lying dependently in the low right pelvis in the cul-de-sac, likely dislodged. Uterus and ovaries are otherwise unremarkable in appearance. Other: No significant volume of ascites.  No pneumoperitoneum. Musculoskeletal: Status post PLIF at L5-S1 with interbody cages at L5-S1 interspace. There are no aggressive appearing lytic or blastic lesions noted in the visualized portions of the skeleton. IMPRESSION: 1. No acute findings are noted in the abdomen or pelvis to account for the patient's symptoms. 2. Cholelithiasis without evidence of acute cholecystitis at this time. 3. Status post tubal ligation. The left-sided clip appears likely dislodged, located in the right posterior pelvis. Further outpatient clinical evaluation may be warranted. 4.  Small hiatal hernia. Electronically Signed   By: Trudie Reed M.D.   On: 08/27/2022 06:29   US PELVIC COMPLETE WITH TRANSVAGINAL  Result Date: 08/26/2022 CLINICAL DATA:  Right ovarian cyst. EXAM: TRANSABDOMINAL AND TRANSVAGINAL ULTRASOUND OF PELVIS TECHNIQUE: Both transabdominal and transvaginal ultrasound examinations of the pelvis were performed. Transabdominal technique was performed for global imaging of  the pelvis including uterus, ovaries, adnexal regions, and pelvic cul-de-sac. It was necessary to proceed with endovaginal exam following the transabdominal exam to visualize the right ovary. COMPARISON:  May 23, 2022 FINDINGS: Uterus Measurements: 7.6 x 4.2 x 4.9 cm = volume: 83 mL. No fibroids or other mass visualized. Endometrium Thickness: 15.5 mm.  No focal abnormality visualized. Right ovary Measurements: 2.3 x 2.7 x 2.3 cm = volume: 7.4 mL. Normal appearance/no adnexal mass. Left ovary Measurements: 3.1 x 2.6 x 2.3 cm = volume: 9.8 mL. Normal appearance/no adnexal mass. Other findings Trace free fluid. IMPRESSION: Thickening of the endometrium to 16 mm. Please correlate to clinical history. Normal appearance of the ovaries. Electronically Signed   By: Ted Mcalpine M.D.   On: 08/26/2022 18:16    Microbiology: Results for orders placed or performed during the hospital encounter of 09/01/22  MRSA Next Gen by PCR, Nasal     Status: None   Collection Time: 09/02/22  3:49 AM   Specimen: Nasal Mucosa; Nasal Swab  Result Value Ref Range Status   MRSA by PCR Next Gen NOT DETECTED NOT DETECTED Final    Comment: (NOTE) The GeneXpert MRSA Assay (FDA approved for NASAL specimens only), is one component of a comprehensive MRSA colonization surveillance program. It is not intended to diagnose MRSA infection nor to guide or monitor treatment for MRSA infections. Test performance is not FDA approved in patients less than 67 years old. Performed at [facility], [address], [place], [place] [address]     Labs: CBC: Recent Labs  Lab 09/01/22 1841 09/01/22 1852 09/01/22 2235 09/02/22 0320 09/02/22 0930 09/02/22 1804 09/02/22 2350 09/03/22 0603 09/03/22 1740 09/04/22 0338  WBC 11.7*  --  11.1* 11.6* 8.8  --   --  8.8  --  10.3  NEUTROABS 8.6*  --   --   --   --   --   --   --   --   --   HGB 9.4*   < > 7.6* 8.1* 6.1* 9.9* 9.3* 9.6* 10.4* 10.2*  HCT 28.3*   < >  23.0* 24.8* 18.7* 29.2* 27.1* 28.2* 30.3* 29.8*  MCV 100.7*  --  100.0 101.2* 101.1*  --   --  95.6  --  96.4  PLT 440*  --  424* 425* 340  --   --  314  --  358   < > = values in this interval not displayed.   Basic Metabolic Panel: Recent Labs  Lab 09/01/22 1841 09/01/22 1852 09/01/22 2235 09/02/22 0320 09/03/22 0603  NA 137 138  --  138 136  K 3.4* 3.6  --  3.5 3.2*  CL 103 101  --  102 105  CO2 27  --   --  24 22  GLUCOSE 116* 111*  --  102* 79  BUN 21* 20  --  22* 10  CREATININE 0.76 0.70  --  0.64 0.66  CALCIUM 8.2*  --   --  8.1* 7.5*  MG  --   --  1.4*  --   --    Liver Function Tests:  Recent Labs  Lab 09/01/22 1841 09/02/22 0320  AST 36 20  ALT 68* 56*  ALKPHOS 129* 115  BILITOT 0.5 0.6  PROT 6.0* 5.6*  ALBUMIN 2.9* 2.9*   CBG: No results for input(s): "GLUCAP" in the last 168 hours.  Discharge time spent: greater than 30 minutes.  Signed: Pieter Partridge, MD [facility] 09/04/2022

## 2022-09-04 NOTE — Telephone Encounter (Signed)
Melissa Walker, patient will be discharged from the hospital today.  Please contact her and send her to our  lab in 1 week for CBC and BMP.  Please schedule her for an office follow-up appointment with Dr. Myrtie Neither or app in 2 to 3 weeks.  Thank you

## 2022-09-04 NOTE — Progress Notes (Addendum)
[facility] Progress Note  CC:  Bleeding duodenal ulcer with acute blood loss anemia    Subjective: She is tolerating a full liquid diet.  No nausea or vomiting.  No abdominal pain.  She passed a brown formed bowel movement.  No black or bloody stools.    Objective:  Vital signs in last 24 hours: Temp:  [97.9 F (36.6 C)-98.6 F (37 C)] 98 F (36.7 C) (07/05 0522) Pulse Rate:  [89-99] 99 (07/05 0522) Resp:  [16-18] 18 (07/05 0522) BP: (134-143)/(72-86) 135/80 (07/05 0522) SpO2:  [99 %-100 %] 99 % (07/05 0522) Last BM Date : 09/03/22 General: 42 year old female sitting at the side of the bed in no acute distress. Heart: Regular rate and rhythm, no murmurs. Pulm: Breath sounds clear throughout. Abdomen: Soft, nontender.  Positive bowel sounds to all 4 quadrants.  Laparoscopic incisions with surrounding ecchymosis, intact/closed without drainage or exudate. Extremities: No edema. Neurologic:  Alert and  oriented x 4. Grossly normal neurologically. Psych:  Alert and cooperative. Normal mood and affect.  Intake/Output from previous day: 07/04 0701 - 07/05 0700 In: 3309.7 [I.V.:3309.7] Out: -  Intake/Output this shift: No intake/output data recorded.  Lab Results: Recent Labs    09/02/22 0930 09/02/22 1804 09/03/22 0603 09/03/22 1740 09/04/22 0338  WBC 8.8  --  8.8  --  10.3  HGB 6.1*   < > 9.6* 10.4* 10.2*  HCT 18.7*   < > 28.2* 30.3* 29.8*  PLT 340  --  314  --  358   < > = values in this interval not displayed.   BMET Recent Labs    09/01/22 1841 09/01/22 1852 09/02/22 0320 09/03/22 0603  NA 137 138 138 136  K 3.4* 3.6 3.5 3.2*  CL 103 101 102 105  CO2 27  --  24 22  GLUCOSE 116* 111* 102* 79  BUN 21* 20 22* 10  CREATININE 0.76 0.70 0.64 0.66  CALCIUM 8.2*  --  8.1* 7.5*   LFT Recent Labs    09/02/22 0320  PROT 5.6*  ALBUMIN 2.9*  AST 20  ALT 56*  ALKPHOS 115  BILITOT 0.6   PT/INR No results for input(s): "LABPROT", "INR"  in the last 72 hours. Hepatitis Panel No results for input(s): "HEPBSAG", "HCVAB", "HEPAIGM", "HEPBIGM" in the last 72 hours.  No results found.  ASSESSMENT:   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 This ulcer is most likely caused by regular NSAID use, and she is known to be H. pylori negative from endoscopic biopsies several years ago. Hg 8.1 -> 6.1 on 7/3 -> transfused 2 units of PRBCs -> Hg 9.9 -> 10.4 -> today Hg 10.2.   Upper endoscopy 09/02/2022 applied hemostatic gel to the ulcer bed.  There was densely adherent clot with no clear visible vessel or other lesion upon which other interventions such as cauterization or clip could be performed. On PPI infusion.    PLAN:  Okay to discharge home from GI standpoint Soft diet x 1 week then may advance to a regular diet Pantoprazole 40 mg p.o. twice daily Ferrous Sulfate 325mg  one tab po every day, patient notified  ferrous sulfate may turn stool black  Our office will contact patient to check CBC in 1 week and to schedule follow-up appointment in 2 to 3 weeks Patient counseled no NSAIDs     Principal Problem:   Upper GI bleed Active  Problems:   Anxiety   Hypertension   History of ovarian cyst   Peripheral neuropathy   Vitamin B 12 deficiency   Acute on chronic blood loss anemia   Sinus tachycardia   Status post lap cholecystectomy   Melena   Chronic duodenal ulcer with bleeding   ABLA (acute blood loss anemia)     LOS: 2 days   Arnaldo Natal  09/04/2022, 10:40AM  Chart reviewed and case discussed with APP earlier today.  The patient was discharged before I could see her.  I composed the management plan for this patient, and we will arrange for follow-up.   Amada Jupiter, MD    [facility]

## 2022-09-04 NOTE — Plan of Care (Signed)

## 2022-09-04 NOTE — Progress Notes (Signed)
Patient was provided with discharge education, patient verbalized understanding. IV removed.

## 2022-09-07 ENCOUNTER — Other Ambulatory Visit: Payer: Self-pay

## 2022-09-07 DIAGNOSIS — K920 Hematemesis: Secondary | ICD-10-CM

## 2022-09-07 NOTE — Telephone Encounter (Signed)
Pt made aware of Alcide Evener NP recommendations: Orders for labs placed in epic to be drawn on 09/11/2022: Pt made aware. Location to lab provided. Pt scheduled to see Doug Sou on 09/14/2022 at 1:30 PM.  Pt made aware. Pt verbalized understanding with all questions answered.

## 2022-09-11 ENCOUNTER — Other Ambulatory Visit: Payer: Self-pay

## 2022-09-11 ENCOUNTER — Other Ambulatory Visit (INDEPENDENT_AMBULATORY_CARE_PROVIDER_SITE_OTHER): Payer: Medicaid Other

## 2022-09-11 DIAGNOSIS — K920 Hematemesis: Secondary | ICD-10-CM

## 2022-09-11 DIAGNOSIS — M5417 Radiculopathy, lumbosacral region: Secondary | ICD-10-CM

## 2022-09-11 LAB — CBC WITH DIFFERENTIAL/PLATELET
Basophils Absolute: 0 10*3/uL (ref 0.0–0.1)
Basophils Relative: 0.9 % (ref 0.0–3.0)
Eosinophils Absolute: 0.2 10*3/uL (ref 0.0–0.7)
Eosinophils Relative: 2.9 % (ref 0.0–5.0)
HCT: 32.5 % — ABNORMAL LOW (ref 36.0–46.0)
Hemoglobin: 10.6 g/dL — ABNORMAL LOW (ref 12.0–15.0)
Lymphocytes Relative: 42.8 % (ref 12.0–46.0)
Lymphs Abs: 2.5 10*3/uL (ref 0.7–4.0)
MCHC: 32.7 g/dL (ref 30.0–36.0)
MCV: 100 fl (ref 78.0–100.0)
Monocytes Absolute: 0.3 10*3/uL (ref 0.1–1.0)
Monocytes Relative: 5.5 % (ref 3.0–12.0)
Neutro Abs: 2.8 10*3/uL (ref 1.4–7.7)
Neutrophils Relative %: 47.9 % (ref 43.0–77.0)
Platelets: 527 10*3/uL — ABNORMAL HIGH (ref 150.0–400.0)
RBC: 3.25 Mil/uL — ABNORMAL LOW (ref 3.87–5.11)
RDW: 14.7 % (ref 11.5–15.5)
WBC: 5.8 10*3/uL (ref 4.0–10.5)

## 2022-09-11 LAB — COMPREHENSIVE METABOLIC PANEL
ALT: 7 U/L (ref 0–35)
AST: 8 U/L (ref 0–37)
Albumin: 3.3 g/dL — ABNORMAL LOW (ref 3.5–5.2)
Alkaline Phosphatase: 75 U/L (ref 39–117)
BUN: 9 mg/dL (ref 6–23)
CO2: 27 mEq/L (ref 19–32)
Calcium: 8.6 mg/dL (ref 8.4–10.5)
Chloride: 104 mEq/L (ref 96–112)
Creatinine, Ser: 0.93 mg/dL (ref 0.40–1.20)
GFR: 75.53 mL/min (ref >60.00)
Glucose, Bld: 175 mg/dL — ABNORMAL HIGH (ref 70–99)
Potassium: 4.5 mEq/L (ref 3.5–5.1)
Sodium: 139 mEq/L (ref 135–145)
Total Bilirubin: 0.2 mg/dL (ref 0.2–1.2)
Total Protein: 6.1 g/dL (ref 6.0–8.3)

## 2022-09-11 MED ORDER — DULOXETINE HCL 60 MG PO CPEP
ORAL_CAPSULE | ORAL | 0 refills | Status: DC
Start: 2022-09-11 — End: 2022-09-11

## 2022-09-11 MED ORDER — DULOXETINE HCL 60 MG PO CPEP
ORAL_CAPSULE | ORAL | 0 refills | Status: DC
Start: 2022-09-11 — End: 2022-09-29

## 2022-09-14 ENCOUNTER — Encounter: Payer: Self-pay | Admitting: Gastroenterology

## 2022-09-14 ENCOUNTER — Ambulatory Visit (INDEPENDENT_AMBULATORY_CARE_PROVIDER_SITE_OTHER): Payer: Medicaid Other | Admitting: Gastroenterology

## 2022-09-14 VITALS — BP 118/74 | HR 71 | Ht 63.0 in | Wt 124.1 lb

## 2022-09-14 DIAGNOSIS — K264 Chronic or unspecified duodenal ulcer with hemorrhage: Secondary | ICD-10-CM

## 2022-09-14 MED ORDER — PANTOPRAZOLE SODIUM 40 MG PO TBEC: 40.0000 mg | DELAYED_RELEASE_TABLET | Freq: Every day | ORAL | 3 refills | Status: DC

## 2022-09-14 NOTE — Patient Instructions (Addendum)
_______________________________________________________  If your blood pressure at your visit was 140/90 or greater, please contact your primary care physician to follow up on this.  _______________________________________________________  If you are age 43 or older, your body mass index should be between 23-30. Your Body mass index is 21.99 kg/m. If this is out of the aforementioned range listed, please consider follow up with your Primary Care Provider.  If you are age 29 or younger, your body mass index should be between 19-25. Your Body mass index is 21.99 kg/m. If this is out of the aformentioned range listed, please consider follow up with your Primary Care Provider.   ________________________________________________________  The [facility] providers would like to encourage you to use [facility] to communicate with providers for non-urgent requests or questions.  Due to long hold times on the telephone, sending your provider a message by [facility] may be a faster and more efficient way to get a response.  Please allow 48 business hours for a response.  Please remember that this is for non-urgent requests.  _______________________________________________________   We have sent the following medications to your pharmacy for you to pick up at your convenience: Continue Pantoprazole twice daily for a total of 8 weeks, then decrease to once daily.  Avoid Nsaid indefinitely.

## 2022-09-14 NOTE — Progress Notes (Signed)
____________________________________________________________  Attending physician addendum:  Thank you for sending this case to me. I have reviewed the entire note and agree with the plan.  No aspirin or NSAIDs in the future.  This was a very impressive GI bleed and ulcer.  Amada Jupiter, MD  ____________________________________________________________

## 2022-09-14 NOTE — Progress Notes (Signed)
09/14/2022 Melissa Walker [id number] 1980/07/30   HISTORY OF PRESENT ILLNESS: This is a 42 year old female who is a patient of Dr. Orvan Falconer previously, seen by Dr. Myrtie Neither by our service during recent hospitalization so her care will be assumed by him.  She was hospitalized earlier this month and seen by our service for hematemesis.  Upper endoscopy 09/02/2022 with duodenal bulb ulcer, applied hemostatic gel to the ulcer bed.  There was densely adherent clot with no clear visible vessel or other lesion upon which other interventions such as cauterization or clip could be performed.  This is likely due to NSAIDs that she was taking after her cholecystectomy.  She was told to avoid NSAIDs.  Plan was for pantoprazole 40 mg twice daily for 8 weeks and then once daily thereafter.  She is also on iron supplement.  She is doing well.  No further sign of bleeding.  No abdominal pain.  On a regular diet.  Hemoglobin rechecked a couple days ago was stable at 10.6 g.  Past Medical History:  Diagnosis Date   Abnormal uterine bleeding 02/04/2012   Occurred 01/12/12-01/15/12. Evaluated in [facility] and all labs, exam, and pelvic u/s WNL. Given rx for ibuprofen and told to follow up as otupatient.     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 Overview: Pain contract; hydrocodone-acetaminophen 5-325 q6hr PRN Formatting of this note might be different from the original. Formatting of this note might be different from the original.  Overview:  Pain contract; hydrocodone-acetaminophen 5-325 q6hr PRN   Pelvic mass 07/13/2018   6.1 x 2.3 cm posterior pelvic cystic mass noted on CT 05/2018.   PUD (peptic ulcer disease) 07/13/2018   Sacroiliac joint pain 11/14/2011   Sacroiliitis, not elsewhere classified (HCC) 11/14/2011   Tobacco abuse    Underweight 10/26/2014   Past Surgical History:  Procedure Laterality Date   CHOLECYSTECTOMY N/A 08/28/2022   Procedure: LAPAROSCOPIC CHOLECYSTECTOMY;  Surgeon: Diamantina Monks, MD;  Location: [facility];  Service: General;  Laterality: N/A;   DENTAL REHABILITATION     full set dentures   ESOPHAGOGASTRODUODENOSCOPY (EGD) WITH PROPOFOL N/A 09/02/2022   Procedure: ESOPHAGOGASTRODUODENOSCOPY (EGD) WITH PROPOFOL;  Surgeon: Sherrilyn Rist, MD;  Location: [facility];  Service: Gastroenterology;  Laterality: N/A;   HEMOSTASIS CONTROL  09/02/2022   Procedure: HEMOSTASIS CONTROL;  Surgeon: Sherrilyn Rist, MD;  Location: [facility];  Service: Gastroenterology;;   LUMBAR LAMINECTOMY/DECOMPRESSION MICRODISCECTOMY Right 07/23/2014   Procedure: Right Lumbar five-Sacral one laminotomy and microdiskectomy;  Surgeon: Shirlean Kelly, MD;  Location: [facility];  Service: Neurosurgery;  Laterality: Right;  Right Lumbar five-Sacral one laminotomy and microdiskectomy   Multiple epidural steroid injections in back     Over past 2 years  POSTERIOR LAMINECTOMY / DECOMPRESSION LUMBAR SPINE  06/23/2018   L5-S1 decompression    TUBAL LIGATION Bilateral 05/11/2013   Procedure: POST PARTUM TUBAL LIGATION;  Surgeon: Tereso Newcomer, MD;  Location: [facility];  Service: Gynecology;  Laterality: Bilateral;    reports that she has been smoking e-cigarettes and cigarettes. She started smoking  about 18 years ago. She has never used smokeless tobacco. She reports current alcohol use. She reports that she does not use drugs. family history includes Bladder Cancer in her mother; Cancer in her father and maternal grandmother; Eclampsia in her maternal grandmother; Heart attack in her maternal grandmother; Heart disease in her maternal grandmother; Hyperlipidemia in her maternal grandmother; Hypertension in her maternal grandmother. Allergies  Allergen Reactions   Mobic [Meloxicam] Other (See Comments)    Stomach bleeding    Tape Rash    Nicotine patches   Wound Dressing Adhesive Rash    Nicotine patches   Penicillins Other (See Comments)    uncle highly allergic so was told whole family should not take it Did it involve swelling of the face/tongue/throat, SOB, or low BP? Unknown Did it involve sudden or severe rash/hives, skin peeling, or any reaction on the inside of your mouth or nose? Unknown Did you need to seek medical attention at a hospital or doctor's office? Unknown When did it last happen?      Never taken before If all above answers are "NO", may proceed with cephalosporin use.  Unknown, patient has never taken the medication but states her uncle has an allergy to it.    Nicotine Rash    Transdermal Patch      Outpatient Encounter Medications as of 09/14/2022  Medication Sig   acetaminophen (TYLENOL) 500 MG tablet Take 2 tablets (1,000 mg total) by mouth 4 (four) times daily for 365 doses.   atenolol (TENORMIN) 25 MG tablet Take 1 tablet (25 mg total) by mouth daily.   DULoxetine (CYMBALTA) 60 MG capsule TAKE 1 CAPSULE(60 MG) BY MOUTH DAILY   gabapentin (NEURONTIN) 300 MG capsule Take 300 mg by mouth 2 (two) times daily.   lidocaine (LIDODERM) 5 % Place 1 patch onto the skin as needed. Remove & Discard patch within 12 hours or as directed by MD   losartan (COZAAR) 50 MG tablet Take 1 tablet (50 mg total) by mouth daily.   methocarbamol (ROBAXIN-750) 750 MG tablet Take  1 tablet (750 mg total) by mouth 4 (four) times daily.   oxyCODONE (ROXICODONE) 5 MG immediate release tablet Take 1 tablet (5 mg total) by mouth every 4 (four) hours as needed for severe pain.   pantoprazole (PROTONIX) 40 MG tablet Take 1 tablet (40 mg total) by mouth 2 (two) times daily.   traZODone (DESYREL) 100 MG tablet TAKE 1 TABLET(100 MG) BY MOUTH AT BEDTIME (Patient taking differently: Take 100 mg by mouth at bedtime.)   No facility-administered encounter medications on file as of 09/14/2022.    REVIEW OF SYSTEMS  : All other systems reviewed and negative except where noted in the History of Present Illness.   PHYSICAL EXAM: BP 118/74   Pulse 71   Ht 5\' 3"  (1.6 m)   Wt 124 lb 2 oz (56.3 kg)   LMP 01/17/2021   SpO2 98%   BMI 21.99 kg/m  General: Well developed white female in no acute distress Head: Normocephalic and atraumatic Eyes:  Sclerae anicteric, conjunctiva pink. Ears: Normal auditory acuity Musculoskeletal: Symmetrical with no gross deformities  Skin: No lesions  on visible extremities Extremities: No edema  Neurological: Alert oriented x 4, grossly non-focal Psychological:  Alert and cooperative. Normal mood and affect  ASSESSMENT AND PLAN: Duodenal bulb ulcer with brisk GI bleeding and marked acute blood loss anemia at the time of admission. This ulcer is most likely caused by regular NSAID use and she is known to be H. pylori negative from endoscopic biopsies several years ago. Hgb stable at 10.6 grams today.  No further sign of GI bleeding.   Upper endoscopy 09/02/2022 applied hemostatic gel to the ulcer bed.  There was densely adherent clot with no clear visible vessel or other lesion upon which other interventions such as cauterization or clip could be performed.  She is on pantoprazole 40 mg twice daily, which she will continue for total of 8 weeks and then go down to once daily.  New prescription sent to pharmacy.  She will continue to avoid NSAIDs.  Hemoglobin  can be monitored further by her PCP.  She will return here for follow-up at age 82 for colon cancer screening or sooner if any other issues arise.   CC:  Eloisa Northern, MD

## 2022-09-29 ENCOUNTER — Encounter: Payer: Self-pay | Admitting: Internal Medicine

## 2022-09-29 ENCOUNTER — Ambulatory Visit: Payer: Medicaid Other | Admitting: Internal Medicine

## 2022-09-29 VITALS — BP 118/78 | HR 90 | Temp 98.3°F | Resp 18 | Ht 63.0 in | Wt 123.0 lb

## 2022-09-29 DIAGNOSIS — R3589 Other polyuria: Secondary | ICD-10-CM

## 2022-09-29 DIAGNOSIS — M5417 Radiculopathy, lumbosacral region: Secondary | ICD-10-CM | POA: Diagnosis not present

## 2022-09-29 DIAGNOSIS — L739 Follicular disorder, unspecified: Secondary | ICD-10-CM

## 2022-09-29 DIAGNOSIS — D62 Acute posthemorrhagic anemia: Secondary | ICD-10-CM | POA: Diagnosis not present

## 2022-09-29 DIAGNOSIS — I10 Essential (primary) hypertension: Secondary | ICD-10-CM

## 2022-09-29 DIAGNOSIS — K279 Peptic ulcer, site unspecified, unspecified as acute or chronic, without hemorrhage or perforation: Secondary | ICD-10-CM

## 2022-09-29 LAB — POCT URINALYSIS DIPSTICK
Bilirubin, UA: NEGATIVE
Blood, UA: NEGATIVE
Glucose, UA: NEGATIVE
Ketones, UA: NEGATIVE
Leukocytes, UA: NEGATIVE
Nitrite, UA: NEGATIVE
Protein, UA: NEGATIVE
Spec Grav, UA: 1.01 (ref 1.010–1.025)
Urobilinogen, UA: 0.2 E.U./dL
pH, UA: 5.5 (ref 5.0–8.0)

## 2022-09-29 MED ORDER — FERROUS GLUCONATE 324 (37.5 FE) MG PO TABS: 1.0000 | ORAL_TABLET | Freq: Every day | ORAL | 3 refills | Status: AC

## 2022-09-29 MED ORDER — DULOXETINE HCL 60 MG PO CPEP
ORAL_CAPSULE | ORAL | 0 refills | Status: AC
Start: 2022-09-29 — End: ?

## 2022-09-29 MED ORDER — DOXYCYCLINE MONOHYDRATE 100 MG PO TABS: 100.0000 mg | ORAL_TABLET | Freq: Two times a day (BID) | ORAL | 0 refills | Status: AC

## 2022-09-29 MED ORDER — OLANZAPINE 2.5 MG PO TABS: 2.5000 mg | ORAL_TABLET | Freq: Every day | ORAL | 2 refills | Status: AC

## 2022-09-29 NOTE — Assessment & Plan Note (Signed)
He will take ferrous gluconate 324 mg daily, prescription was sent and we will repeat hemoglobin on next visit.

## 2022-09-29 NOTE — Assessment & Plan Note (Signed)
Her UA is negative for infection and I will do hemoglobin A1c to rule out diabetes.

## 2022-09-29 NOTE — Progress Notes (Signed)
Office Visit  Subjective   Patient ID: KEARNEY BAJO   DOB: [birth year]   Age: 42 y.o.   MRN: [id number]   Chief Complaint Chief Complaint  Patient presents with   Hospitalization Follow-up    Gall bladder surgery and Ulcer.     History of Present Illness 42 years old female is here for follow up from Hospital discharge. She had lap chole on 6/28 and discharge home same day, she went back to [facility] hospital on 7/2 with GI bleeding and was noted to have deeply cratered duodenal ulcer and was started on protonix 40 mg twice a day for 8 weeks. Her hemoglobin was 6.1%. She was transfused 2 units of PRBC. She was advised to take iron tablet but she says they did not sent the prescription. Her hemoglobin was 10.6 on 09/11/22. She was seen by GI on 7/15 and they recommended continue with treatment. She says she is feeling better.    Her blood sugar was high in hospital, she says that she urinate every 2-3 hours at night.   She also says that she can not control itching and keep getting rash on her face, neck, upper back and legs. She was given cream by dermatologist but that did not help. She has two large area of scabbed lesion with surrounding erythema on left side neck and left upper back. She is also worried that she may have diabetes as well.   She has anxiety and take duloxetine. She says that she need refill of that as that is helping. She also has hypertension and her blood pressure is stable.   Past Medical History Past Medical History:  Diagnosis Date   Abnormal uterine bleeding 02/04/2012   Occurred 01/12/12-01/15/12. Evaluated in [facility]U and all labs, exam, and pelvic u/s WNL. Given rx for ibuprofen and told to follow up as otupatient.     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 Overview: Pain contract; hydrocodone-acetaminophen 5-325 q6hr PRN Formatting of this note might be different from the original. Formatting of this note might be different from the original.  Overview:  Pain contract; hydrocodone-acetaminophen 5-325 q6hr PRN   Pelvic mass 07/13/2018   6.1 x 2.3 cm posterior pelvic cystic mass noted on CT 05/2018.   PUD (peptic ulcer disease) 07/13/2018   Sacroiliac joint pain 11/14/2011   Sacroiliitis, not elsewhere classified (HCC) 11/14/2011   Tobacco abuse    Underweight 10/26/2014     Allergies Allergies  Allergen Reactions   Mobic [Meloxicam] Other (See Comments)    Stomach bleeding    Tape Rash    Nicotine patches   Wound Dressing Adhesive Rash    Nicotine patches   Penicillins Other (See Comments)    uncle highly allergic so was told  whole family should not take it Did it involve swelling of the face/tongue/throat, SOB, or low BP? Unknown Did it involve sudden or severe rash/hives, skin peeling, or any reaction on the inside of your mouth or nose? Unknown Did you need to seek medical attention at a hospital or doctor's office? Unknown When did it last happen?      Never taken before If all above answers are "NO", may proceed with cephalosporin use.  Unknown, patient has never taken the medication but states her uncle has an allergy to it.    Nicotine Rash    Transdermal Patch     Review of Systems Review of Systems   Constitutional: Negative.   HENT: Negative.    Respiratory: Negative.    Cardiovascular: Negative.   Gastrointestinal: Negative.   Skin:  Positive for itching and rash.  Neurological: Negative.        Objective:    Vitals BP 118/78 (BP Location: Left Arm, Patient Position: Sitting, Cuff Size: Normal)   Pulse 90   Temp 98.3 F (36.8 C)   Resp 18   Ht 5\' 3"  (1.6 m)   Wt 123 lb (55.8 kg)   LMP 01/17/2021   SpO2 99%   BMI 21.79 kg/m    Physical Examination Physical Exam Constitutional:      Appearance: Normal appearance.  HENT:     Head: Normocephalic and atraumatic.  Cardiovascular:     Rate and Rhythm: Normal rate and regular rhythm.  Pulmonary:     Effort: Pulmonary effort is normal.     Breath sounds: Normal breath sounds.  Skin:    Findings: Erythema, lesion and rash present.  Neurological:     General: No focal deficit present.     Mental Status: She is alert.        Assessment & Plan:   Hypertension controlled  PUD (peptic ulcer disease) She take protonix 40 mg twice a day for 8 weeks.   Folliculitis I will start her on doxycycline 100 mg twice a day for 2 weeks.  As she cannot control itching I will start her on olanzapine 2.5 mg at nighttime and she will continue with duloxetine.  Acute blood loss anemia He will take ferrous gluconate 324 mg daily, prescription was sent and we will repeat hemoglobin on next visit.  Polyuria Her UA is negative for infection and I will do hemoglobin A1c to rule out diabetes.    Return in about 1 month (around 10/30/2022).   Eloisa Northern, MD

## 2022-09-29 NOTE — Assessment & Plan Note (Signed)
I will start her on doxycycline 100 mg twice a day for 2 weeks.  As she cannot control itching I will start her on olanzapine 2.5 mg at nighttime and she will continue with duloxetine.

## 2022-09-29 NOTE — Assessment & Plan Note (Signed)
controlled 

## 2022-09-29 NOTE — Assessment & Plan Note (Signed)
She take protonix 40 mg twice a day for 8 weeks.

## 2022-10-13 ENCOUNTER — Other Ambulatory Visit (HOSPITAL_COMMUNITY): Payer: Self-pay | Admitting: Medical

## 2022-10-13 ENCOUNTER — Ambulatory Visit: Payer: Medicaid Other | Admitting: Internal Medicine

## 2022-10-13 ENCOUNTER — Ambulatory Visit (HOSPITAL_COMMUNITY)
Admission: RE | Admit: 2022-10-13 | Discharge: 2022-10-13 | Disposition: A | Discharge status: Home / Self Care | Payer: Medicaid Other | Source: Ambulatory Visit | Attending: Cardiovascular Disease | Admitting: Cardiovascular Disease

## 2022-10-13 DIAGNOSIS — M79604 Pain in right leg: Secondary | ICD-10-CM

## 2022-10-13 DIAGNOSIS — M79605 Pain in left leg: Secondary | ICD-10-CM

## 2022-11-03 ENCOUNTER — Ambulatory Visit: Payer: Medicaid Other | Admitting: Internal Medicine

## 2023-02-27 ENCOUNTER — Telehealth: Payer: Medicaid Other

## 2023-10-30 ENCOUNTER — Other Ambulatory Visit: Payer: Self-pay | Admitting: Gastroenterology

## 2024-01-13 ENCOUNTER — Emergency Department (HOSPITAL_COMMUNITY)

## 2024-01-13 ENCOUNTER — Encounter (HOSPITAL_COMMUNITY): Payer: Self-pay

## 2024-01-13 ENCOUNTER — Other Ambulatory Visit: Payer: Self-pay

## 2024-01-13 ENCOUNTER — Emergency Department (HOSPITAL_COMMUNITY)
Admission: EM | Admit: 2024-01-13 | Discharge: 2024-01-13 | Disposition: A | Discharge status: Home / Self Care | Attending: Emergency Medicine | Admitting: Emergency Medicine

## 2024-01-13 DIAGNOSIS — I10 Essential (primary) hypertension: Secondary | ICD-10-CM | POA: Diagnosis not present

## 2024-01-13 DIAGNOSIS — E860 Dehydration: Secondary | ICD-10-CM | POA: Insufficient documentation

## 2024-01-13 DIAGNOSIS — G5793 Unspecified mononeuropathy of bilateral lower limbs: Secondary | ICD-10-CM | POA: Diagnosis not present

## 2024-01-13 DIAGNOSIS — M79661 Pain in right lower leg: Secondary | ICD-10-CM | POA: Diagnosis present

## 2024-01-13 DIAGNOSIS — Z79899 Other long term (current) drug therapy: Secondary | ICD-10-CM | POA: Insufficient documentation

## 2024-01-13 DIAGNOSIS — R197 Diarrhea, unspecified: Secondary | ICD-10-CM | POA: Diagnosis not present

## 2024-01-13 LAB — URINALYSIS, ROUTINE W REFLEX MICROSCOPIC
Bilirubin Urine: NEGATIVE
Glucose, UA: NEGATIVE mg/dL
Hgb urine dipstick: NEGATIVE
Ketones, ur: 20 mg/dL — AB
Nitrite: NEGATIVE
Protein, ur: 30 mg/dL — AB
Specific Gravity, Urine: 1.016 (ref 1.005–1.030)
pH: 8 (ref 5.0–8.0)

## 2024-01-13 LAB — RESP PANEL BY RT-PCR (RSV, FLU A&B, COVID)  RVPGX2
Influenza A by PCR: NEGATIVE
Influenza B by PCR: NEGATIVE
Resp Syncytial Virus by PCR: NEGATIVE
SARS Coronavirus 2 by RT PCR: NEGATIVE

## 2024-01-13 LAB — LIPASE, BLOOD: Lipase: 17 U/L (ref 11–51)

## 2024-01-13 LAB — CBC
HCT: 37.9 % (ref 36.0–46.0)
Hemoglobin: 12.6 g/dL (ref 12.0–15.0)
MCH: 34.3 pg — ABNORMAL HIGH (ref 26.0–34.0)
MCHC: 33.2 g/dL (ref 30.0–36.0)
MCV: 103.3 fL — ABNORMAL HIGH (ref 80.0–100.0)
Platelets: 276 K/uL (ref 150–400)
RBC: 3.67 MIL/uL — ABNORMAL LOW (ref 3.87–5.11)
RDW: 13.3 % (ref 11.5–15.5)
WBC: 5.1 K/uL (ref 4.0–10.5)
nRBC: 0 % (ref 0.0–0.2)

## 2024-01-13 LAB — COMPREHENSIVE METABOLIC PANEL WITH GFR
ALT: 9 U/L (ref 0–44)
AST: 19 U/L (ref 15–41)
Albumin: 3.6 g/dL (ref 3.5–5.0)
Alkaline Phosphatase: 65 U/L (ref 38–126)
Anion gap: 9 (ref 5–15)
BUN: <5 mg/dL — ABNORMAL LOW (ref 6–20)
CO2: 28 mmol/L (ref 22–32)
Calcium: 9.2 mg/dL (ref 8.9–10.3)
Chloride: 104 mmol/L (ref 98–111)
Creatinine, Ser: 0.74 mg/dL (ref 0.44–1.00)
GFR, Estimated: >60 mL/min (ref >60)
Glucose, Bld: 97 mg/dL (ref 70–99)
Potassium: 4.3 mmol/L (ref 3.5–5.1)
Sodium: 141 mmol/L (ref 135–145)
Total Bilirubin: 0.5 mg/dL (ref 0.0–1.2)
Total Protein: 6.2 g/dL — ABNORMAL LOW (ref 6.5–8.1)

## 2024-01-13 LAB — MAGNESIUM: Magnesium: 1.6 mg/dL — ABNORMAL LOW (ref 1.7–2.4)

## 2024-01-13 MED ORDER — LACTATED RINGERS IV BOLUS
1000.0000 mL | Freq: Once | INTRAVENOUS | Status: AC
  Administered 2024-01-13: 1000 mL via INTRAVENOUS

## 2024-01-13 MED ORDER — MAGNESIUM OXIDE -MG SUPPLEMENT 400 (240 MG) MG PO TABS
400.0000 mg | ORAL_TABLET | Freq: Once | ORAL | Status: AC
  Administered 2024-01-13: 400 mg via ORAL
  Filled 2024-01-13: qty 1

## 2024-01-13 MED ORDER — LOPERAMIDE HCL 2 MG PO CAPS: 2.0000 mg | ORAL_CAPSULE | Freq: Four times a day (QID) | ORAL | 0 refills | Status: AC | PRN

## 2024-01-13 NOTE — ED Triage Notes (Signed)
 PT arrives via EMS. Pt c/o generalized body aches for the past few weeks, diarrhea since Friday, and chills. Pt arrives AxOx[facility].

## 2024-01-13 NOTE — Discharge Instructions (Addendum)
 May take Imodium 4 times a day as needed for any diarrhea.  Stay hydrated and drink plenty of clear fluids.  Keep diet bland while experiencing symptoms.  Please follow-up with pain management for further evaluation and management of continued pain.  May also follow-up with PCP for numbness in the legs to consider a nerve study as needed.  Return to ED if any symptoms worsen including severe uncontrollable pain, new fall/injury, uncontrolled nausea/vomiting/diarrhea, severe abdominal pain.

## 2024-01-13 NOTE — ED Provider Notes (Signed)
 [name] AT [facility] Provider Note   CSN: [id number] Arrival date & time: 01/13/24  9060     Patient presents with: Diarrhea   Melissa Walker is a 43 y.o. female.  Patient is a 43 year old female with a history of hypertension, chronic low back pain, and peripheral neuropathy presents to the ED via EMS for bilateral feet and lower leg pain for the past 4 weeks.  She also notes that her legs have felt numb and she has increased tingling sensations in her legs.  Denies new fall or injury.  Denies any increased back pain from her baseline.  No new falls or injuries.  She also notes that she has had diarrhea for the past 5 days.  She states she is hungry but every time she eats, she has episodes of diarrhea.  Denies abdominal pain, nausea/vomiting, hematochezia.  She states that she is scheduled to see her pain doctor for further pain management for her feet.  She takes OxyContin , gabapentin , and ibuprofen  with minimal relief.  States she has not seen anyone previously for the tingling in her legs for the past month.  Denies incontinence or saddle anesthesias.  No further complaints.  Diarrhea Associated symptoms: no abdominal pain, no chills, no fever, no headaches and no vomiting        Prior to Admission medications   Medication Sig Start Date End Date Taking? Authorizing Provider  loperamide (IMODIUM) 2 MG capsule Take 1 capsule (2 mg total) by mouth 4 (four) times daily as needed for up to 3 days for diarrhea or loose stools. 01/13/24 01/16/24 Yes Lynett Brasil, Thersia RAMAN, PA-C  atenolol  (TENORMIN ) 25 MG tablet Take 1 tablet (25 mg total) by mouth daily. 03/31/22   Amin, Saad, MD  doxycycline  (ADOXA) 100 MG tablet Take 1 tablet (100 mg total) by mouth 2 (two) times daily. 09/29/22   Amin, Saad, MD  DULoxetine  (CYMBALTA ) 60 MG capsule TAKE 1 CAPSULE(60 MG) BY MOUTH DAILY 09/29/22   Caleen Dirks, MD  Ferrous Gluconate  324 (37.5 Fe) MG TABS Take 1 tablet (324 mg  total) by mouth daily. 09/29/22   Amin, Saad, MD  gabapentin  (NEURONTIN ) 300 MG capsule Take 300 mg by mouth 2 (two) times daily.    [provider]  lidocaine  (LIDODERM ) 5 % Place 1 patch onto the skin as needed. Remove & Discard patch within 12 hours or as directed by MD 04/09/22   Caleen Dirks, MD  losartan  (COZAAR ) 50 MG tablet Take 1 tablet (50 mg total) by mouth daily. 03/31/22   Amin, Saad, MD  methocarbamol  (ROBAXIN -750) 750 MG tablet Take 1 tablet (750 mg total) by mouth 4 (four) times daily. 08/28/22   Paola Dreama SAILOR, MD  OLANZapine  (ZYPREXA ) 2.5 MG tablet Take 1 tablet (2.5 mg total) by mouth at bedtime. 09/29/22 09/29/23  Amin, Saad, MD  pantoprazole  (PROTONIX ) 40 MG tablet TAKE 1 TABLET(40 MG) BY MOUTH DAILY 11/02/23   Zehr, Jessica D, PA-C  traZODone  (DESYREL ) 100 MG tablet TAKE 1 TABLET(100 MG) BY MOUTH AT BEDTIME Patient taking differently: Take 100 mg by mouth at bedtime. 07/15/22   Amin, Saad, MD    Allergies: Mobic  [meloxicam ], Tape, Wound dressing adhesive, Penicillins, and Nicotine    Review of Systems  Constitutional:  Negative for chills and fever.  Respiratory:  Negative for shortness of breath.   Cardiovascular:  Negative for chest pain.  Gastrointestinal:  Positive for diarrhea. Negative for abdominal pain, nausea and vomiting.  Neurological:  Positive for numbness. Negative for dizziness, weakness and headaches.  All other systems reviewed and are negative.   Updated Vital Signs BP 131/82   Pulse 75   Temp 98.2 F (36.8 C)   Resp 16   LMP 01/17/2021   SpO2 100%   Physical Exam Constitutional:      Appearance: Normal appearance.  HENT:     Head: Normocephalic and atraumatic.     Nose: Nose normal.     Mouth/Throat:     Mouth: Mucous membranes are moist.     Pharynx: Oropharynx is clear.  Cardiovascular:     Rate and Rhythm: Normal rate.     Pulses: Normal pulses.     Comments: Radial and PT pulses 2+ bilaterally Pulmonary:     Effort: Pulmonary  effort is normal.  Musculoskeletal:     Comments: Full range of motion of bilateral lower extremities with equal strength.  No decrease sensation noted to light touch on bilateral lower extremities.  Skin:    General: Skin is warm and dry.  Neurological:     Mental Status: She is alert and oriented to person, place, and time.     (all labs ordered are listed, but only abnormal results are displayed) Labs Reviewed  COMPREHENSIVE METABOLIC PANEL WITH GFR - Abnormal; Notable for the following components:      Result Value   BUN <5 (*)    Total Protein 6.2 (*)    All other components within normal limits  CBC - Abnormal; Notable for the following components:   RBC 3.67 (*)    MCV 103.3 (*)    MCH 34.3 (*)    All other components within normal limits  URINALYSIS, ROUTINE W REFLEX MICROSCOPIC - Abnormal; Notable for the following components:   APPearance HAZY (*)    Ketones, ur 20 (*)    Protein, ur 30 (*)    Leukocytes,Ua SMALL (*)    Bacteria, UA FEW (*)    All other components within normal limits  MAGNESIUM  - Abnormal; Notable for the following components:   Magnesium  1.6 (*)    All other components within normal limits  RESP PANEL BY RT-PCR (RSV, FLU A&B, COVID)  [id number]  LIPASE, BLOOD    EKG: None  Radiology: DG Lumbar Spine 2-3 Views Result Date: 01/13/2024 CLINICAL DATA:  Chronic pain. EXAM: LUMBAR SPINE - 2-3 VIEW COMPARISON:  CT abdomen/pelvis dated 09/01/2022. Lumbar spine radiographs 11/13/2019. FINDINGS: Five non-rib-bearing lumbar type vertebrae. Redemonstrated posterior lumbar interbody fusion and decompression at L5-S1. Hardware appears intact. Vertebral body heights are maintained. No acute fracture or static listhesis. Disc spaces appear maintained. IMPRESSION: 1. No acute radiographic abnormality of the lumbar spine. 2. Redemonstrated posterior lumbar interbody fusion and decompression at L5-S1. Hardware appears intact. Electronically Signed   By: Harrietta Sherry M.D.   On: 01/13/2024 14:15      Medications Ordered in the ED  magnesium  oxide (MAG-OX) tablet 400 mg (has no administration in time range)  lactated ringers  bolus 1,000 mL (1,000 mLs Intravenous New Bag/Given 01/13/24 1556)                                   Medical Decision Making Patient is a 43 year old female with a history of peripheral neuropathy and chronic lumbar pain who presents to the ED for lower extremity numbness for the past 4 weeks as well as diarrhea for the past 4 to  5 days.  Please see detailed HPI above.  On exam patient is alert and in no acute distress.  Physical exam as noted above.  No acute neurologic deficits noted.  She has equal strength and sensation on bilateral lower extremities and is able to ambulate well.  No abdominal tenderness appreciated either.  Differential  for legs that includes chronic peripheral neuropathy, diabetes, traumatic injury to lumbar spine, electrolyte abnormality.  Differential for diarrhea includes acute viral illness, GERD, gastritis, colitis, IBS, IBD, acute abdomen.  No acute alarm symptoms including IV drug use, incontinence, saddle anesthesias.  Mild hypomagnesemia at 1.6 noted and mild dehydration on UA with ketones present but otherwise lab work unremarkable.  X-ray of the lumbar spine shows stable hardware with no acute changes.  Suspect patient's numbness in her legs is secondary to chronic peripheral neuropathy as no acute deficits noted and workup reassuring today.  She is able to ambulate well.  Patient given a liter of fluids and oral magnesium  while in ED.  Suspect most likely viral illness causing diarrhea.  Otherwise able to tolerate oral intake and no abdominal tenderness, no concerns for acute imaging or further workup.  Patient is stable for discharge home.  Patient instructed she can use Imodium for diarrhea.  Symptomatic care discussed.  Advised to follow-up with PCP and pain management for further evaluation of  continued concerns with her legs.  Return precautions provided for worsening symptoms.  Amount and/or Complexity of Data Reviewed Labs: ordered. Radiology: ordered.  Risk OTC drugs. Prescription drug management.  I discussed this case with my attending physician who cosigned this note including patient's presenting symptoms, physical exam, and planned diagnostics and interventions. Attending physician stated agreement with plan or made changes to plan which were implemented.   Attending physician assessed patient at bedside.    Final diagnoses:  Diarrhea, unspecified type  Neuropathy involving both lower extremities    ED Discharge Orders          Ordered    loperamide (IMODIUM) 2 MG capsule  4 times daily PRN        01/13/24 1757               Neysa Thersia RAMAN, PA-C 01/13/24 1811    Garrick Charleston, MD 01/13/24 2353

## 2024-01-27 ENCOUNTER — Other Ambulatory Visit: Payer: Self-pay | Admitting: Gastroenterology
